# Patient Record
Sex: Male | Born: 1940 | Race: White | Hispanic: Yes | Marital: Married | Smoking: Never smoker
Health system: Southern US, Community
[De-identification: ages and names within clinical notes are randomized; demographics above are authoritative.]

## PROBLEM LIST (undated history)

## (undated) DIAGNOSIS — N289 Disorder of kidney and ureter, unspecified: Secondary | ICD-10-CM

## (undated) DIAGNOSIS — I442 Atrioventricular block, complete: Secondary | ICD-10-CM

## (undated) DIAGNOSIS — I10 Essential (primary) hypertension: Secondary | ICD-10-CM

## (undated) DIAGNOSIS — I447 Left bundle-branch block, unspecified: Secondary | ICD-10-CM

## (undated) DIAGNOSIS — E785 Hyperlipidemia, unspecified: Secondary | ICD-10-CM

## (undated) DIAGNOSIS — I255 Ischemic cardiomyopathy: Secondary | ICD-10-CM

## (undated) DIAGNOSIS — I2510 Atherosclerotic heart disease of native coronary artery without angina pectoris: Secondary | ICD-10-CM

## (undated) HISTORY — DX: Ischemic cardiomyopathy: I25.5

## (undated) HISTORY — PX: UMBILICAL HERNIA REPAIR: SHX196

## (undated) HISTORY — DX: Atherosclerotic heart disease of native coronary artery without angina pectoris: I25.10

## (undated) HISTORY — DX: Essential (primary) hypertension: I10

## (undated) HISTORY — DX: Left bundle-branch block, unspecified: I44.7

## (undated) HISTORY — PX: TONSILLECTOMY: SUR1361

## (undated) HISTORY — DX: Hyperlipidemia, unspecified: E78.5

## (undated) HISTORY — DX: Atrioventricular block, complete: I44.2

## (undated) HISTORY — PX: INGUINAL HERNIA REPAIR: SUR1180

## (undated) HISTORY — PX: OTHER SURGICAL HISTORY: SHX169

---

## 2010-06-12 HISTORY — PX: CORONARY ARTERY BYPASS GRAFT: SHX141

## 2010-11-21 LAB — PROTIME-INR

## 2011-01-14 ENCOUNTER — Ambulatory Visit (INDEPENDENT_AMBULATORY_CARE_PROVIDER_SITE_OTHER): Payer: Medicare Other | Admitting: Cardiology

## 2011-01-14 ENCOUNTER — Encounter: Payer: Self-pay | Admitting: Cardiology

## 2011-01-14 DIAGNOSIS — I2589 Other forms of chronic ischemic heart disease: Secondary | ICD-10-CM

## 2011-01-14 DIAGNOSIS — E785 Hyperlipidemia, unspecified: Secondary | ICD-10-CM

## 2011-01-14 DIAGNOSIS — I255 Ischemic cardiomyopathy: Secondary | ICD-10-CM

## 2011-01-14 DIAGNOSIS — I10 Essential (primary) hypertension: Secondary | ICD-10-CM | POA: Insufficient documentation

## 2011-01-14 DIAGNOSIS — I2510 Atherosclerotic heart disease of native coronary artery without angina pectoris: Secondary | ICD-10-CM | POA: Insufficient documentation

## 2011-01-14 MED ORDER — CARVEDILOL 6.25 MG PO TABS: 6.2500 mg | ORAL_TABLET | Freq: Two times a day (BID) | ORAL | Status: DC

## 2011-01-14 MED ORDER — PRAVASTATIN SODIUM 40 MG PO TABS: 40.0000 mg | ORAL_TABLET | Freq: Every evening | ORAL | Status: DC

## 2011-01-14 NOTE — Assessment & Plan Note (Signed)
Continue aspirin, beta blocker and ARB. Patient with previous rash to Crestor and myalgias with Zocor. Try Pravachol 40 mg daily. Check lipids and liver in 6 weeks.

## 2011-01-14 NOTE — Patient Instructions (Addendum)
Your physician recommends that you schedule a follow-up appointment in: 6 WEEKS  Your physician has requested that you have an echocardiogram. Echocardiography is a painless test that uses sound waves to create images of your heart. It provides your doctor with information about the size and shape of your heart and how well your heart's chambers and valves are working. This procedure takes approximately one hour. There are no restrictions for this procedure. AT THE [place] OFFICE-IN June 2013  STOP METOPROLOL  START CARVEDILOL 6.25 MG ONE TABLET TWICE DAILY  START PRAVASTATIN 40 MG ONCE DAILY AT BEDTIME  Your physician recommends that you return for a FASTING lipid profile: 6 WEEKS AFTER STARTING PRAVASTATIN  REFERRAL TO EP TO DISCUSS ICD

## 2011-01-14 NOTE — Progress Notes (Signed)
HPI: 70 year old male with past medical history of coronary artery disease status post coronary artery bypass and graft as well as ischemic cardiomyopathy for establishment. Patient recently moved from [place] to this area and presents to establish with a cardiologist. Patient's cardiac history dates back to 2010 when he had his first myocardial infarction. He had stents placed in [place]. He had a second myocardial infarction in March of 2012 and then had coronary artery bypass and graft. He was again admitted in August of 2012. He ruled in for a small subendocardial myocardial infarction. An echocardiogram in August of 2012 showed an ejection fraction of 15%. There was a question of small left ventricular apical thrombus. Apparently contrast echocardiogram was performed but I do not have those results available. There was also right atrial and right ventricular enlargement. There was mild mitral regurgitation. There was moderate aortic insufficiency. There is mild tricuspid regurgitation with moderately elevated pulmonary pressures. Cardiac catheterization was also performed in August of 2012. Ejection fraction was 20%. The right coronary and LAD were occluded and there was critical circumflex disease. There was a patent saphenous vein graft to the right coronary artery. The LIMA to the LAD was patent. The saphenous vein graft to the obtuse marginal was also patent. Patient has some dyspnea on exertion but no orthopnea or PND. Occasional mild edema in the left lower extremity where his veins were harvested. Occasional chest pain when turning in certain positions. No exertional chest pain. No syncope.  Current Outpatient Prescriptions  Medication Sig Dispense Refill  . allopurinol (ZYLOPRIM) 100 MG tablet Take 100 mg by mouth daily.        Marland Kitchen aspirin 325 MG tablet Take 325 mg by mouth daily.        . carvedilol (COREG) 6.25 MG tablet Take 1 tablet (6.25 mg total) by mouth 2 (two) times daily.  60 tablet  11  .  ezetimibe (ZETIA) 10 MG tablet Take 10 mg by mouth daily.        . Multiple Vitamins-Minerals (CENTRUM SILVER PO) Take 1 tablet by mouth daily.        . Omega-3 Fatty Acids (FISH OIL) 1000 MG CAPS Take by mouth 2 (two) times daily.        . pravastatin (PRAVACHOL) 40 MG tablet Take 1 tablet (40 mg total) by mouth every evening.  30 tablet  11  . saw palmetto 160 MG capsule Take 160 mg by mouth daily.        Marland Kitchen telmisartan (MICARDIS) 80 MG tablet Take 80 mg by mouth daily.          Allergies  Allergen Reactions  . Statins     Past Medical History  Diagnosis Date  . Hypertension   . Hyperlipidemia   . Gout   . CAD (coronary artery disease)   . Ischemic cardiomyopathy     Past Surgical History  Procedure Date  . Coronary artery bypass graft 3/12  . Tonsillectomy   . Umbilical hernia repair   . Inguinal hernia repair   . Carpel tunnel surgery     History   Social History  . Marital Status: Married    Spouse Name: N/A    Number of Children: 4  . Years of Education: N/A   Occupational History  .      Retired   Social History Main Topics  . Smoking status: Never Smoker   . Smokeless tobacco: Not on file  . Alcohol Use: Yes  occasional  . Drug Use: No  . Sexually Active: Not on file   Other Topics Concern  . Not on file   Social History Narrative  . No narrative on file    No family history on file.  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 Remaining systems are negative.  Physical Exam: General:  Well developed/well nourished in NAD Skin warm/dry Patient not depressed No peripheral clubbing Back-normal HEENT-normal/normal eyelids Neck supple/normal carotid upstroke bilaterally; no bruits; no JVD; no thyromegaly chest - CTA/ normal expansion; status post sternotomy CV - RRR/normal S1 and S2; no murmurs,  rubs or gallops;  PMI nondisplaced Abdomen -NT/ND, no HSM, no mass, + bowel sounds, no bruit 2+ femoral pulses, no bruits Ext-trace edema, no eventchords, 2+ DP Neuro-grossly nonfocal  ECG sinus rhythm at a rate of 73. First degree AV block. Left bundle branch block. Left atrial enlargement.

## 2011-01-14 NOTE — Assessment & Plan Note (Signed)
Add Pravachol 40 mg daily and check lipids and liver in 6 weeks.

## 2011-01-14 NOTE — Assessment & Plan Note (Signed)
Continue ARB. Discontinue metoprolol. Add Coreg 6.25 mg p.o. B.i.d. Patient had a small myocardial infarction in August of 2012. His ejection fraction was severely reduced. Repeat echocardiogram in November. I will also refer to the electrophysiology as he will most certainly require ICD. Obtain records from previous hospitalization. There is note of question LV apical thrombus. Apparently the patient had a followup study with contrast and I will obtain those results.

## 2011-01-14 NOTE — Assessment & Plan Note (Signed)
Blood pressure controlled. 

## 2011-01-15 ENCOUNTER — Telehealth: Payer: Self-pay | Admitting: Cardiology

## 2011-01-19 ENCOUNTER — Telehealth: Payer: Self-pay | Admitting: Cardiology

## 2011-01-19 ENCOUNTER — Ambulatory Visit: Payer: Medicare Other | Admitting: Internal Medicine

## 2011-01-19 NOTE — Telephone Encounter (Addendum)
ROI faxed to [facility] in [place] @ [phone]  01/19/11/km  Records received from [facility] gave to [facility]  02/10/11/km

## 2011-01-28 ENCOUNTER — Encounter: Payer: Self-pay | Admitting: Cardiology

## 2011-02-02 ENCOUNTER — Telehealth: Payer: Self-pay | Admitting: Cardiology

## 2011-02-02 NOTE — Telephone Encounter (Signed)
SPOKE WITH PT IN GREAT LENGTH RE  NEW MEDS PRAVASTATIN AND CARVEDILOL  PT C/O  SWELLING TO FACE LEGS AND ANKLES  DIARRHEA AND LOSS OF APPETITE HAD STOPPED BOTH MEDS AND WENT BACK TO METOPROLOL  INFORMED PT  MAYBE TO HOLD PRAVASTATIN AND RESUME CARVEDILOL  TO SEE IF TOLERATES HARD TO KNOW WHICH MED CAUSED PROBLEMS WHEN BOTH WERE STARTED AT SAME TIME   PT VERBALIZED UNDERSTANDING  WILL RESUME CARVEDILOL INFORMED  CARVEDILOL WILL  HELP WITH HEART FUNCTION./CY

## 2011-02-02 NOTE — Telephone Encounter (Signed)
Discontinue pravachol and metoprolol and continue coreg Olga Millers

## 2011-02-02 NOTE — Telephone Encounter (Signed)
Pt called and is having a problem with some of his medications.  Please call him back regarding same.

## 2011-02-04 ENCOUNTER — Encounter: Payer: Self-pay | Admitting: Internal Medicine

## 2011-02-04 ENCOUNTER — Ambulatory Visit (INDEPENDENT_AMBULATORY_CARE_PROVIDER_SITE_OTHER): Payer: Medicare Other | Admitting: Internal Medicine

## 2011-02-04 DIAGNOSIS — I255 Ischemic cardiomyopathy: Secondary | ICD-10-CM

## 2011-02-04 DIAGNOSIS — I10 Essential (primary) hypertension: Secondary | ICD-10-CM

## 2011-02-04 DIAGNOSIS — E785 Hyperlipidemia, unspecified: Secondary | ICD-10-CM

## 2011-02-04 DIAGNOSIS — I5023 Acute on chronic systolic (congestive) heart failure: Secondary | ICD-10-CM

## 2011-02-04 DIAGNOSIS — I5022 Chronic systolic (congestive) heart failure: Secondary | ICD-10-CM

## 2011-02-04 DIAGNOSIS — I519 Heart disease, unspecified: Secondary | ICD-10-CM | POA: Insufficient documentation

## 2011-02-04 DIAGNOSIS — I2589 Other forms of chronic ischemic heart disease: Secondary | ICD-10-CM

## 2011-02-04 MED ORDER — FUROSEMIDE 40 MG PO TABS: 40.0000 mg | ORAL_TABLET | Freq: Every day | ORAL | Status: DC

## 2011-02-04 MED ORDER — ALLOPURINOL 100 MG PO TABS: 100.0000 mg | ORAL_TABLET | Freq: Every day | ORAL | Status: DC

## 2011-02-04 MED ORDER — POTASSIUM CHLORIDE 10 MEQ PO TBCR: EXTENDED_RELEASE_TABLET | ORAL | Status: DC

## 2011-02-04 MED ORDER — POTASSIUM CHLORIDE ER 10 MEQ PO TBCR: 20.0000 meq | EXTENDED_RELEASE_TABLET | Freq: Every day | ORAL | Status: DC

## 2011-02-04 MED ORDER — POTASSIUM CHLORIDE CRYS ER 20 MEQ PO TBCR: 20.0000 meq | EXTENDED_RELEASE_TABLET | Freq: Every day | ORAL | Status: DC

## 2011-02-04 NOTE — Telephone Encounter (Signed)
Pharmacy calling regarding potassium being called in with 2 strengths 10 mg and 20 mg. Pharmacy wanted to clarify which one MD wanted filled. Please return call to discuss further.

## 2011-02-04 NOTE — Progress Notes (Signed)
Referring Physician:  Dr Jens Som PCP: none  [place] Service is a pleasant 70 y.o. patient with a h/o CAD, ischemic CM, and NYHA Class II/III CHF who presents today for EP consultation regarding risk stratefication of sudden death.  He recently moved from [place] to this area. His cardiac history dates back to 2010 when he had his first myocardial infarction. He had stents placed in [place]. He had a second myocardial infarction in March of 2012 and then had coronary artery bypass and graft. He was again admitted in August of 2012. He ruled in for a small subendocardial myocardial infarction. An echocardiogram in August of 2012 showed an ejection fraction of 15%. There was a question of small left ventricular apical thrombus.  There was also right atrial and right ventricular enlargement. There was mild mitral regurgitation. There was moderate aortic insufficiency. There is mild tricuspid regurgitation with moderately elevated pulmonary pressures. Cardiac catheterization was also performed in August of 2012. Ejection fraction was 20%. The right coronary and LAD were occluded and there was critical circumflex disease. There was a patent saphenous vein graft to the right coronary artery. The LIMA to the LAD was patent. The saphenous vein graft to the obtuse marginal was also patent.  Presently, he reports being sedentary.  He feels that he could walk 10 blocks but does not do this very often.  He reports dypnsea with about 5 blocks.  He has significant pedal edema, which has increased since his recent visit to Dr Jens Som.  Today, he denies symptoms of palpitations, chest pain, dizziness, presyncope, syncope, or neurologic sequela. The patient is tolerating medications without difficulties and is otherwise without complaint today.   Past Medical History  Diagnosis Date  . Hypertension   . Hyperlipidemia   . Gout   . CAD (coronary artery disease)   . Ischemic cardiomyopathy   . LBBB (left bundle branch  block)    Past Surgical History  Procedure Date  . Coronary artery bypass graft 3/12    in [place]  . Tonsillectomy   . Umbilical hernia repair   . Inguinal hernia repair   . Carpel tunnel surgery     Current Outpatient Prescriptions  Medication Sig Dispense Refill  . allopurinol (ZYLOPRIM) 100 MG tablet Take 100 mg by mouth daily.        . carvedilol (COREG) 6.25 MG tablet Take 1 tablet (6.25 mg total) by mouth 2 (two) times daily.  60 tablet  11  . ezetimibe (ZETIA) 10 MG tablet Take 10 mg by mouth daily.        . Multiple Vitamins-Minerals (CENTRUM SILVER PO) Take 1 tablet by mouth daily.        . Omega-3 Fatty Acids (FISH OIL) 1000 MG CAPS Take by mouth 2 (two) times daily.        . pravastatin (PRAVACHOL) 40 MG tablet Take 1 tablet (40 mg total) by mouth every evening.  30 tablet  11  . saw palmetto 160 MG capsule Take 160 mg by mouth daily.        Marland Kitchen telmisartan (MICARDIS) 80 MG tablet Take 80 mg by mouth daily.        Marland Kitchen aspirin 325 MG tablet Take 325 mg by mouth daily.          Allergies  Allergen Reactions  . Statins     History   Social History  . Marital Status: Married    Spouse Name: N/A    Number of Children: 4  .  Years of Education: N/A   Occupational History  .      Retired   Social History Main Topics  . Smoking status: Never Smoker   . Smokeless tobacco: Not on file  . Alcohol Use: Yes     occasional  . Drug Use: No  . Sexually Active: Not on file   Other Topics Concern  . 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  FH- denies FH of CAD  ROS- All systems are reviewed and negative except as per the HPI above  Physical Exam: Filed Vitals:   02/04/11 1141  BP: 128/80  Pulse: 72  Height: 5\' 6"  (1.676 m)  Weight: 175 lb 1.9 oz (79.434 kg)    GEN- The patient is well appearing, alert and oriented x 3 today.   Head- normocephalic, atraumatic Eyes-  Sclera clear,  conjunctiva pink Ears- hearing intact Oropharynx- clear Neck- supple, JVP 9cm Lymph- no cervical lymphadenopathy Lungs- Clear to ausculation bilaterally, normal work of breathing Heart- Regular rate and rhythm, no murmurs, rubs or gallops, PMI not laterally displaced GI- soft, NT, ND, + BS Extremities- no clubbing, cyanosis, 2+ L>R edema MS- no significant deformity or atrophy Skin- no rash or lesion Psych- euthymic mood, full affect Neuro- strength and sensation are intact  EKG 01/14/11- sinus rhythm 73 bpm, PR 288, LBBB (QRS )  Assessment and Plan:

## 2011-02-04 NOTE — Patient Instructions (Signed)
Your physician recommends that you schedule a follow-up appointment in: 2 weeks with Tereso Newcomer,      Your physician recommends that you return for lab work in: 2 weeks same day as appointment with Tereso Newcomer  Your physician has recommended you make the following change in your medication:  1)start Furosemide 20mg  daily 2)start Potassium daily 3)start Alopurinol 100mg  daily for gout  Keep your follow up as scheduled with Dr Jens Som

## 2011-02-04 NOTE — Assessment & Plan Note (Signed)
Stable No change required today  

## 2011-02-04 NOTE — Assessment & Plan Note (Signed)
The patient has CAD, ischemic CM (EF 20%), and NYHA CLass III CHF.  He has a LBBB with QRS .  He has been treated with good medical therapy.  At this time, he meets MADIT II/ SCD-HeFT criteria for ICD implantation for primary prevention of sudden death.  Given his LBBB and QRS of , he also has class IIa indication for CRT.  Risks, benefits, alternatives to BiVICD implantation were discussed in detail with the patient today. The patient  understands that the risks include but are not limited to bleeding, infection, pneumothorax, perforation, tamponade, vascular damage, renal failure, MI, stroke, death, inappropriate shocks, and lead dislodgement and wishes to further contemplate this option.  He will contact my office if he decides to proceed with BiV ICD in the future.

## 2011-02-04 NOTE — Telephone Encounter (Signed)
Spoke with Dave Daniels at the pharm, klor-con is 20 meq daily [name]

## 2011-02-04 NOTE — Assessment & Plan Note (Signed)
Reports intolerance with statins previously but appears to be doing ok with Pravachol.  Will defer to Dr Jens Som

## 2011-02-10 ENCOUNTER — Encounter: Payer: Self-pay | Admitting: *Deleted

## 2011-02-11 ENCOUNTER — Ambulatory Visit (INDEPENDENT_AMBULATORY_CARE_PROVIDER_SITE_OTHER): Payer: Medicare Other | Admitting: Cardiology

## 2011-02-11 ENCOUNTER — Encounter: Payer: Self-pay | Admitting: Cardiology

## 2011-02-11 ENCOUNTER — Telehealth: Payer: Self-pay | Admitting: Cardiology

## 2011-02-11 DIAGNOSIS — I255 Ischemic cardiomyopathy: Secondary | ICD-10-CM

## 2011-02-11 DIAGNOSIS — I2510 Atherosclerotic heart disease of native coronary artery without angina pectoris: Secondary | ICD-10-CM

## 2011-02-11 DIAGNOSIS — Z79899 Other long term (current) drug therapy: Secondary | ICD-10-CM

## 2011-02-11 DIAGNOSIS — E7800 Pure hypercholesterolemia, unspecified: Secondary | ICD-10-CM

## 2011-02-11 DIAGNOSIS — I10 Essential (primary) hypertension: Secondary | ICD-10-CM

## 2011-02-11 DIAGNOSIS — I2589 Other forms of chronic ischemic heart disease: Secondary | ICD-10-CM

## 2011-02-11 NOTE — Assessment & Plan Note (Addendum)
Continue beta blocker and ARB. Patient euvolemic on examination. Continue present dose of Lasix. Check potassium and renal function. He is now ready to have ICD placed. Repeat echocardiogram in mid November which will be 3 months from his last infarct. If ejection fraction less than or equal to 35% proceed with ICD. Note previous echo suggested possible apical thrombus based on outside records. He had a contrast echocardiogram and we are still awaiting those results.

## 2011-02-11 NOTE — Patient Instructions (Signed)
Your physician wants you to follow-up in: 6 MONTHS You will receive a reminder letter in the mail two months in advance. If you don't receive a letter, please call our office to schedule the follow-up appointment.   Your physician recommends that you return for a FASTING lipid profile: WHEN ABLE  Your physician has requested that you have an echocardiogram. Echocardiography is a painless test that uses sound waves to create images of your heart. It provides your doctor with information about the size and shape of your heart and how well your heart's chambers and valves are working. This procedure takes approximately one hour. There are no restrictions for this procedure.

## 2011-02-11 NOTE — Telephone Encounter (Addendum)
ROI faxed to Dr.James Altamirano @ [phone]  02/11/11/km  Records received from Dr.Altamirano's Office gave to [facility] 02/17/11/km

## 2011-02-11 NOTE — Assessment & Plan Note (Signed)
Continue aspirin and statin. 

## 2011-02-11 NOTE — Assessment & Plan Note (Signed)
Blood pressure controlled. Continue present medications. 

## 2011-02-11 NOTE — Progress Notes (Signed)
HPI: Pleasant male with past medical history of coronary artery disease status post coronary artery bypass and graft as well as ischemic cardiomyopathy for FU. Patient recently moved from [place] to this area. Patient's cardiac history dates back to 2010 when he had his first myocardial infarction. He had stents placed in [place]. He had a second myocardial infarction in March of 2012 and then had coronary artery bypass and graft. He was again admitted in August of 2012. He ruled in for a small subendocardial myocardial infarction. An echocardiogram in August of 2012 showed an ejection fraction of 15%. There was a question of small left ventricular apical thrombus. Apparently contrast echocardiogram was performed but I do not have those results available. There was also right atrial and right ventricular enlargement. There was mild mitral regurgitation. There was moderate aortic insufficiency. There is mild tricuspid regurgitation with moderately elevated pulmonary pressures. Cardiac catheterization was also performed in August of 2012. Ejection fraction was 20%. The right coronary and LAD were occluded and there was critical circumflex disease. There was a patent saphenous vein graft to the right coronary artery. The LIMA to the LAD was patent. The saphenous vein graft to the obtuse marginal was also patent. Patient seen by Dr Johney Frame for consideration of ICD but he is still contemplating. Since I last saw him in Sept 2012, he denies dyspnea, chest pain, palpitations or syncope. Note low-dose Lasix was added at the time he saw Dr. Johney Frame for lower extremity edema. This has now improved.   Current Outpatient Prescriptions  Medication Sig Dispense Refill  . allopurinol (ZYLOPRIM) 100 MG tablet Take 1 tablet (100 mg total) by mouth daily.  30 tablet  3  . aspirin 325 MG tablet Take 325 mg by mouth daily.        . carvedilol (COREG) 6.25 MG tablet Take 1 tablet (6.25 mg total) by mouth 2 (two) times daily.  60 tablet   11  . ezetimibe (ZETIA) 10 MG tablet Take 10 mg by mouth daily.        . furosemide (LASIX) 40 MG tablet Take 1 tablet (40 mg total) by mouth daily.  30 tablet  11  . Multiple Vitamins-Minerals (CENTRUM SILVER PO) Take 1 tablet by mouth daily.        . Omega-3 Fatty Acids (FISH OIL) 1000 MG CAPS Take by mouth 2 (two) times daily.        . potassium chloride SA (K-DUR,KLOR-CON) 20 MEQ tablet Take 1 tablet (20 mEq total) by mouth daily.  30 tablet  11  . pravastatin (PRAVACHOL) 40 MG tablet Take 1 tablet (40 mg total) by mouth every evening.  30 tablet  11  . saw palmetto 160 MG capsule Take 160 mg by mouth daily.        Marland Kitchen telmisartan (MICARDIS) 80 MG tablet Take 80 mg by mouth daily.        Marland Kitchen DISCONTD: allopurinol (ZYLOPRIM) 100 MG tablet Take 1 tablet (100 mg total) by mouth daily.  30 tablet  3     Past Medical History  Diagnosis Date  . Hypertension   . Hyperlipidemia   . Gout   . CAD (coronary artery disease)   . Ischemic cardiomyopathy   . LBBB (left bundle branch block)     Past Surgical History  Procedure Date  . Coronary artery bypass graft 3/12    in [place]  . Tonsillectomy   . Umbilical hernia repair   . Inguinal hernia repair   .  Carpel tunnel surgery     History   Social History  . Marital Status: Married    Spouse Name: N/A    Number of Children: 4  . Years of Education: N/A   Occupational History  .      Retired   Social History Main Topics  . Smoking status: Never Smoker   . Smokeless tobacco: Not on file  . Alcohol Use: Yes     occasional  . Drug Use: No  . Sexually Active: Not on file   Other Topics Concern  . 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 Remaining systems are  negative.  Physical Exam: Well-developed well-nourished in no acute distress.  Skin is warm and dry.  HEENT is normal.  Neck is supple. No thyromegaly.  Chest is clear to auscultation with normal expansion.  Cardiovascular exam is regular rate and rhythm.  Abdominal exam nontender or distended. No masses palpated. Extremities show no edema. neuro grossly intact

## 2011-02-11 NOTE — Assessment & Plan Note (Signed)
Continue present medications. Check potassium and renal function. 

## 2011-02-12 LAB — BASIC METABOLIC PANEL WITH GFR
BUN: 24 mg/dL — ABNORMAL HIGH (ref 6–23)
CO2: 29 mEq/L (ref 19–32)
Chloride: 103 mEq/L (ref 96–112)
Creat: 1.72 mg/dL — ABNORMAL HIGH (ref 0.50–1.35)

## 2011-02-18 ENCOUNTER — Other Ambulatory Visit: Payer: Self-pay | Admitting: *Deleted

## 2011-02-18 MED ORDER — EZETIMIBE 10 MG PO TABS: 10.0000 mg | ORAL_TABLET | Freq: Every day | ORAL | Status: DC

## 2011-02-25 ENCOUNTER — Other Ambulatory Visit: Payer: Medicare Other | Admitting: *Deleted

## 2011-02-25 ENCOUNTER — Ambulatory Visit: Payer: Medicare Other | Admitting: Physician Assistant

## 2011-02-26 ENCOUNTER — Ambulatory Visit (HOSPITAL_COMMUNITY): Payer: Medicare Other | Attending: Cardiology | Admitting: Radiology

## 2011-02-26 ENCOUNTER — Ambulatory Visit (INDEPENDENT_AMBULATORY_CARE_PROVIDER_SITE_OTHER): Payer: Medicare Other | Admitting: *Deleted

## 2011-02-26 DIAGNOSIS — I080 Rheumatic disorders of both mitral and aortic valves: Secondary | ICD-10-CM | POA: Insufficient documentation

## 2011-02-26 DIAGNOSIS — E785 Hyperlipidemia, unspecified: Secondary | ICD-10-CM | POA: Insufficient documentation

## 2011-02-26 DIAGNOSIS — I2589 Other forms of chronic ischemic heart disease: Secondary | ICD-10-CM

## 2011-02-26 DIAGNOSIS — I10 Essential (primary) hypertension: Secondary | ICD-10-CM | POA: Insufficient documentation

## 2011-02-26 DIAGNOSIS — I079 Rheumatic tricuspid valve disease, unspecified: Secondary | ICD-10-CM | POA: Insufficient documentation

## 2011-02-26 DIAGNOSIS — I2510 Atherosclerotic heart disease of native coronary artery without angina pectoris: Secondary | ICD-10-CM

## 2011-02-26 DIAGNOSIS — I379 Nonrheumatic pulmonary valve disorder, unspecified: Secondary | ICD-10-CM | POA: Insufficient documentation

## 2011-02-26 DIAGNOSIS — E7800 Pure hypercholesterolemia, unspecified: Secondary | ICD-10-CM

## 2011-02-26 DIAGNOSIS — I252 Old myocardial infarction: Secondary | ICD-10-CM | POA: Insufficient documentation

## 2011-02-26 DIAGNOSIS — Z79899 Other long term (current) drug therapy: Secondary | ICD-10-CM

## 2011-02-26 LAB — LIPID PANEL
Cholesterol: 141 mg/dL (ref 0–200)
Total CHOL/HDL Ratio: 3
Triglycerides: 57 mg/dL (ref 0.0–149.0)

## 2011-02-26 LAB — BASIC METABOLIC PANEL
CO2: 29 mEq/L (ref 19–32)
Calcium: 9.5 mg/dL (ref 8.4–10.5)
Chloride: 106 mEq/L (ref 96–112)
Glucose, Bld: 82 mg/dL (ref 70–99)
Sodium: 142 mEq/L (ref 135–145)

## 2011-02-26 LAB — HEPATIC FUNCTION PANEL
ALT: 66 U/L — ABNORMAL HIGH (ref 0–53)
Albumin: 3.8 g/dL (ref 3.5–5.2)
Alkaline Phosphatase: 115 U/L (ref 39–117)
Total Protein: 7.1 g/dL (ref 6.0–8.3)

## 2011-02-27 ENCOUNTER — Telehealth: Payer: Self-pay | Admitting: Cardiology

## 2011-02-27 NOTE — Telephone Encounter (Signed)
Follow up on previous call:  Returning call back to nurse.   

## 2011-02-27 NOTE — Telephone Encounter (Signed)
Spoke with pt, aware of echo and lab results. He will call back to schedule ICD. [name]

## 2011-03-02 ENCOUNTER — Telehealth: Payer: Self-pay | Admitting: Cardiology

## 2011-03-02 NOTE — Telephone Encounter (Signed)
Pt rtn call from Friday to schedule defib placement and the date that is scheduled he can not do that date.

## 2011-03-02 NOTE — Telephone Encounter (Signed)
Spoke with pt, he would like to do his ICD implant on 04/01/11 with dr allred. Will make dr allred's nurse aware [name]

## 2011-03-03 ENCOUNTER — Telehealth: Payer: Self-pay | Admitting: Cardiology

## 2011-03-03 MED ORDER — LISINOPRIL 5 MG PO TABS: 5.0000 mg | ORAL_TABLET | Freq: Every day | ORAL | Status: DC

## 2011-03-03 NOTE — Telephone Encounter (Signed)
PT AWARE OF MED CHANGE./CY 

## 2011-03-03 NOTE — Telephone Encounter (Signed)
New Msg: Pt calling to speak with nurse to see if pt can get alternate medicine for micardis. Please return pt call to discuss further.

## 2011-03-03 NOTE — Telephone Encounter (Signed)
Patient wants to stop micardis due to high cost wants alternative medicine

## 2011-03-03 NOTE — Telephone Encounter (Signed)
If patient has not had a cough or allergy with micardis, dc and begin lisinopril 5 mg daily; bmet one week. Olga Millers

## 2011-03-09 ENCOUNTER — Telehealth: Payer: Self-pay | Admitting: Internal Medicine

## 2011-03-09 ENCOUNTER — Encounter: Payer: Self-pay | Admitting: *Deleted

## 2011-03-09 DIAGNOSIS — I255 Ischemic cardiomyopathy: Secondary | ICD-10-CM

## 2011-03-09 DIAGNOSIS — I2510 Atherosclerotic heart disease of native coronary artery without angina pectoris: Secondary | ICD-10-CM

## 2011-03-09 NOTE — Telephone Encounter (Signed)
Addended by: Dennis Bast F on: 03/09/2011 12:19 PM   Modules accepted: Orders

## 2011-03-09 NOTE — Telephone Encounter (Signed)
Pt was to be scheduled for a  defib placement, wants to know if already set up, if not can he be scheduled?

## 2011-03-09 NOTE — Telephone Encounter (Signed)
Spoke with patient  He is scheduled for 04/01/11  Labs on 03/25/11 at

## 2011-03-14 HISTORY — PX: CARDIAC DEFIBRILLATOR PLACEMENT: SHX171

## 2011-03-16 NOTE — Telephone Encounter (Signed)
Spoke with pt, he has had an emergency and is out of town and will not be able to come back until he is due for labs prior to the procedure with dr allred. Pt given the okay to do blood work for dr Jens Som when he comes for pre-procedure labs [facility]

## 2011-03-16 NOTE — Telephone Encounter (Signed)
Fu call Pt wants to know about blood test he is supposed to take. He missed it today and has some questions.

## 2011-03-25 ENCOUNTER — Ambulatory Visit (INDEPENDENT_AMBULATORY_CARE_PROVIDER_SITE_OTHER): Payer: Medicare Other | Admitting: *Deleted

## 2011-03-25 DIAGNOSIS — I2510 Atherosclerotic heart disease of native coronary artery without angina pectoris: Secondary | ICD-10-CM

## 2011-03-25 DIAGNOSIS — I2589 Other forms of chronic ischemic heart disease: Secondary | ICD-10-CM

## 2011-03-25 DIAGNOSIS — I255 Ischemic cardiomyopathy: Secondary | ICD-10-CM

## 2011-03-25 LAB — CBC WITH DIFFERENTIAL/PLATELET
Basophils Relative: 0.3 % (ref 0.0–3.0)
Eosinophils Absolute: 0.2 10*3/uL (ref 0.0–0.7)
MCHC: 33 g/dL (ref 30.0–36.0)
MCV: 85.7 fl (ref 78.0–100.0)
Monocytes Absolute: 0.6 10*3/uL (ref 0.1–1.0)
Neutrophils Relative %: 76.9 % (ref 43.0–77.0)
RBC: 5.06 Mil/uL (ref 4.22–5.81)

## 2011-03-25 LAB — PROTIME-INR: INR: 1 ratio (ref 0.8–1.0)

## 2011-03-25 LAB — BASIC METABOLIC PANEL
BUN: 30 mg/dL — ABNORMAL HIGH (ref 6–23)
CO2: 34 mEq/L — ABNORMAL HIGH (ref 19–32)
Chloride: 102 mEq/L (ref 96–112)
Creatinine, Ser: 1.6 mg/dL — ABNORMAL HIGH (ref 0.4–1.5)
Glucose, Bld: 69 mg/dL — ABNORMAL LOW (ref 70–99)

## 2011-03-31 ENCOUNTER — Other Ambulatory Visit: Payer: Self-pay | Admitting: *Deleted

## 2011-03-31 DIAGNOSIS — I509 Heart failure, unspecified: Secondary | ICD-10-CM

## 2011-03-31 MED ORDER — SODIUM CHLORIDE 0.9 % IV SOLN: INTRAVENOUS | Status: DC

## 2011-03-31 MED ORDER — CHLORHEXIDINE GLUCONATE 4 % EX LIQD
60.0000 mL | Freq: Once | CUTANEOUS | Status: DC
  Filled 2011-03-31: qty 60

## 2011-03-31 MED ORDER — SODIUM CHLORIDE 0.9 % IR SOLN
80.0000 mg | Status: DC
  Filled 2011-03-31: qty 2

## 2011-03-31 MED ORDER — SODIUM CHLORIDE 0.45 % IV SOLN
INTRAVENOUS | Status: DC
  Administered 2011-04-01: 10:00:00 via INTRAVENOUS

## 2011-03-31 MED ORDER — CEFAZOLIN SODIUM 1-5 GM-% IV SOLN: 1.0000 g | INTRAVENOUS | Status: DC

## 2011-04-01 ENCOUNTER — Ambulatory Visit (HOSPITAL_COMMUNITY)
Admission: RE | Admit: 2011-04-01 | Discharge: 2011-04-02 | Disposition: A | Discharge status: Home / Self Care | Payer: Medicare Other | Source: Ambulatory Visit | Attending: Internal Medicine | Admitting: Internal Medicine

## 2011-04-01 ENCOUNTER — Encounter (HOSPITAL_COMMUNITY)
Admission: RE | Disposition: A | Discharge status: Home / Self Care | Payer: Self-pay | Source: Ambulatory Visit | Attending: Internal Medicine

## 2011-04-01 ENCOUNTER — Encounter (HOSPITAL_COMMUNITY): Payer: Self-pay | Admitting: *Deleted

## 2011-04-01 DIAGNOSIS — I509 Heart failure, unspecified: Secondary | ICD-10-CM

## 2011-04-01 DIAGNOSIS — I255 Ischemic cardiomyopathy: Secondary | ICD-10-CM | POA: Insufficient documentation

## 2011-04-01 DIAGNOSIS — I447 Left bundle-branch block, unspecified: Secondary | ICD-10-CM | POA: Insufficient documentation

## 2011-04-01 DIAGNOSIS — I2589 Other forms of chronic ischemic heart disease: Secondary | ICD-10-CM | POA: Insufficient documentation

## 2011-04-01 DIAGNOSIS — I2510 Atherosclerotic heart disease of native coronary artery without angina pectoris: Secondary | ICD-10-CM | POA: Insufficient documentation

## 2011-04-01 DIAGNOSIS — I10 Essential (primary) hypertension: Secondary | ICD-10-CM | POA: Insufficient documentation

## 2011-04-01 HISTORY — DX: Disorder of kidney and ureter, unspecified: N28.9

## 2011-04-01 HISTORY — PX: BI-VENTRICULAR IMPLANTABLE CARDIOVERTER DEFIBRILLATOR: SHX5459

## 2011-04-01 LAB — SURGICAL PCR SCREEN: Staphylococcus aureus: NEGATIVE

## 2011-04-01 SURGERY — BI-VENTRICULAR IMPLANTABLE CARDIOVERTER DEFIBRILLATOR  (CRT-D)
Anesthesia: LOCAL

## 2011-04-01 MED ORDER — LIDOCAINE HCL (PF) 1 % IJ SOLN
INTRAMUSCULAR | Status: AC
  Filled 2011-04-01: qty 60

## 2011-04-01 MED ORDER — MIDAZOLAM HCL 5 MG/5ML IJ SOLN
INTRAMUSCULAR | Status: AC
  Filled 2011-04-01: qty 5

## 2011-04-01 MED ORDER — SODIUM CHLORIDE 0.9 % IJ SOLN
3.0000 mL | Freq: Two times a day (BID) | INTRAMUSCULAR | Status: DC
  Administered 2011-04-01: 3 mL via INTRAVENOUS

## 2011-04-01 MED ORDER — FENTANYL CITRATE 0.05 MG/ML IJ SOLN
INTRAMUSCULAR | Status: AC
  Filled 2011-04-01: qty 2

## 2011-04-01 MED ORDER — MUPIROCIN 2 % EX OINT
TOPICAL_OINTMENT | Freq: Two times a day (BID) | CUTANEOUS | Status: DC
  Filled 2011-04-01: qty 22

## 2011-04-01 MED ORDER — LISINOPRIL 5 MG PO TABS
5.0000 mg | ORAL_TABLET | Freq: Every day | ORAL | Status: DC
  Administered 2011-04-02: 5 mg via ORAL
  Filled 2011-04-01 (×3): qty 1

## 2011-04-01 MED ORDER — SODIUM CHLORIDE 0.9 % IJ SOLN: 3.0000 mL | INTRAMUSCULAR | Status: DC | PRN

## 2011-04-01 MED ORDER — MUPIROCIN 2 % EX OINT
TOPICAL_OINTMENT | CUTANEOUS | Status: AC
  Filled 2011-04-01: qty 22

## 2011-04-01 MED ORDER — HEPARIN (PORCINE) IN NACL 2-0.9 UNIT/ML-% IJ SOLN
INTRAMUSCULAR | Status: AC
  Filled 2011-04-01: qty 1000

## 2011-04-01 MED ORDER — ACETAMINOPHEN 500 MG PO TABS
1000.0000 mg | ORAL_TABLET | Freq: Four times a day (QID) | ORAL | Status: DC
  Administered 2011-04-01: 1000 mg via ORAL
  Administered 2011-04-02: 500 mg via ORAL
  Filled 2011-04-01 (×5): qty 2

## 2011-04-01 MED ORDER — ONDANSETRON HCL 4 MG/2ML IJ SOLN: 4.0000 mg | Freq: Four times a day (QID) | INTRAMUSCULAR | Status: DC | PRN

## 2011-04-01 MED ORDER — ALLOPURINOL 100 MG PO TABS
100.0000 mg | ORAL_TABLET | Freq: Every day | ORAL | Status: DC
Start: 2011-04-01 — End: 2011-04-02
  Administered 2011-04-01: 100 mg via ORAL
  Filled 2011-04-01 (×2): qty 1

## 2011-04-01 MED ORDER — EZETIMIBE 10 MG PO TABS
10.0000 mg | ORAL_TABLET | Freq: Every day | ORAL | Status: DC
  Filled 2011-04-01 (×2): qty 1

## 2011-04-01 MED ORDER — CEFAZOLIN SODIUM 1-5 GM-% IV SOLN
1.0000 g | Freq: Four times a day (QID) | INTRAVENOUS | Status: AC
  Administered 2011-04-01 – 2011-04-02 (×3): 1 g via INTRAVENOUS
  Filled 2011-04-01 (×3): qty 50

## 2011-04-01 MED ORDER — ACETAMINOPHEN 325 MG PO TABS: 325.0000 mg | ORAL_TABLET | ORAL | Status: DC | PRN

## 2011-04-01 MED ORDER — CARVEDILOL 6.25 MG PO TABS
6.2500 mg | ORAL_TABLET | Freq: Two times a day (BID) | ORAL | Status: DC
  Administered 2011-04-01 – 2011-04-02 (×2): 6.25 mg via ORAL
  Filled 2011-04-01 (×4): qty 1

## 2011-04-01 MED ORDER — HYDROCODONE-ACETAMINOPHEN 5-325 MG PO TABS
1.0000 | ORAL_TABLET | ORAL | Status: DC | PRN
  Administered 2011-04-01: 2 via ORAL
  Filled 2011-04-01: qty 2

## 2011-04-01 NOTE — Op Note (Signed)
SURGEON:  Hillis Range, MD      PREPROCEDURE DIAGNOSES:   1. Ischemic cardiomyopathy.   2. New York Heart Association class III, heart failure chronically.   3. Left bundle-branch block.      POSTPROCEDURE DIAGNOSES:   1. Ischemic cardiomyopathy.   2. 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   3. Left bundle-branch block.      PROCEDURES:    1. Biventricular ICD implantation.  2. Defibrillation threshold testing  3. Programmed extrastimulus testing     INTRODUCTION:  Dave Daniels is a 70 y.o. male with a ischemic CM (EF 20-25%), NYHA Class III CHF, and LBBB QRS morophology. At this time, he meets MADIT II/ SCD-HeFT criteria for ICD implantation for primary prevention of sudden death.  Given LBBB, the patient may also be expected to benefit from resynchronization therapy. The patient has been treated with an optimal medical regimen but continues to have a depressed ejection fraction and NYHA Class III CHF symptoms.  he therefore  presents today for a biventricular ICD implantation.      DESCRIPTION OF PROCEDURE:  Informed written consent was obtained and the patient was brought to the electrophysiology lab in the fasting state. The patient was adequately sedated with intravenous Versed, and fentanyl as outlined in the nursing report.  The patient's left chest was prepped and draped in the usual sterile fashion by the EP lab staff.  The skin overlying the left deltopectoral region was infiltrated with lidocaine for local analgesia.  A 5-cm incision was made over the left deltopectoral region.  A left subcutaneous defibrillator pocket was fashioned using a combination of sharp and blunt dissection.  Electrocautery was used to assure hemostasis.   RA/RV Lead Placement: The left axillary vein was cannulated with fluoroscopic visualization.  No contrast was required for this endeavor.  Through the left axillary vein, a St. Jude Medical Tendril SDX, model 1610RU-04  (serial  # [id number]  ) right atrial lead and a St. Jude Medical Starks, model 5409W-11 (serial number [id number]) right ventricular defibrillator lead were advanced with fluoroscopic visualization into the right atrial appendage and right ventricular apex positions respectively.  Initial atrial lead P-waves measured 5 mV with an impedance of 685 ohms and a threshold of 0.7 volts at 0.5 milliseconds.  The right ventricular lead R-wave measured 19.8 mV with impedance of 706 ohms and a threshold of 0.4 volts at 0.5 milliseconds.   LV Lead Placement: A Medtronic MB-2 guide was advanced through the left axillary vein into the low lateral right atrium.  A Bard curved Damato catheter was introduced through the MB-2 guide and used to cannulate the coronary sinus. A coronary sinus selective venography balloon was advanced through the MB- 2 guide and advanced into the proximal portion of the coronary sinus.  A selective coronary sinus venogram was performed by hand injection of nonionic contrast.  This demonstrated two moderate sized posterolateral coronary sinus branches.  No other posterior branches were identified.  A Whisper CSJ wire was introduced through the transseptal sheath and advanced into the distal posterolateral branch.  A St. Jude Medical Quartet model [phone] - 86 (serial number [id number]) lead was advanced through the MB-2 into the distal posterolateral branch.   This was  approximately one-thirds from the base to the apex in a very lateral  position.  In this location, the left ventricular lead impedance was 1400 ohms and a threshold of 1.6 volt at 0.5  milliseconds in the bipolar configuration with  no diaphragmatic  stimulation observed when pacing at 10 volts output.  The MB-2 guide was  therefore removed.     All three leads were secured to the pectoralis  fascia using #2 silk suture over the suture sleeves.  The pocket then  irrigated with copious gentamicin solution.  The leads were then  connected to a St.  [employer] model CD [phone] - [phone] (serial  Number [id number]) biventricular ICD.  The defibrillator was placed into the  pocket.  The pocket was then closed in 2 layers with 2.0 Vicryl suture  for the subcutaneous and subcuticular layers.  Steri-Strips and a  sterile dressing were then applied.   DFT Testing: Defibrillation Threshold testing was then performed. Ventricular fibrillation was induced with a T shock.  Adequate sensing of ventricular  fibrillation was observed with minimal dropout with a programmed sensitivity of 1.23mV.  The patient was successfully defibrillated to sinus rhythm with a single 15 joules shock delivered from the device with an impedance of 65 ohms in a duration of 3 seconds.  The patient remained in sinus rhythm thereafter.  There were no early apparent complications.  Programmed Extrastimulus testing:  Programmed extrastimulus testing was performed through the device with a basic cycle length of with S1,S2,S3,S4 extrastimuli down to refractoriness.  A right bundle branch right superior axis VT with CL284msec was induced with 500/280/240/260 msec).  An initial burst of ATP (85%) was unsuccessful, however a second ATP burst (85%) successfully terminated the tachycardia.  The procedure was therefore considered completed.  There were no early apparhent complications.     CONCLUSIONS:   1. Ischemic cardiomyopathy with Left bundle-branch block and chronic New York Heart Association class III heart failure.   2. Successful biventricular ICD implantation.   3. DFT less than or equal to 15 joules.   4. RBB Right Superior Axis with CL 275 msec induced with PES and successfully terminated with ATP therapy   5. No early apparent complications.

## 2011-04-01 NOTE — H&P (Signed)
Referring Physician: Dr Jens Som  PCP: none  [place] Service is a pleasant 70 y.o. patient with a h/o CAD, ischemic CM, and NYHA Class II/III CHF who presents today for EP consultation regarding risk stratefication of sudden death. He recently moved from [place] to this area. His cardiac history dates back to 2010 when he had his first myocardial infarction. He had stents placed in [place]. He had a second myocardial infarction in March of 2012 and then had coronary artery bypass and graft. He was again admitted in August of 2012. He ruled in for a small subendocardial myocardial infarction. An echocardiogram in August of 2012 showed an ejection fraction of 15%. There was a question of small left ventricular apical thrombus. There was also right atrial and right ventricular enlargement. There was mild mitral regurgitation. There was moderate aortic insufficiency. There is mild tricuspid regurgitation with moderately elevated pulmonary pressures. Cardiac catheterization was also performed in August of 2012. Ejection fraction was 20%. The right coronary and LAD were occluded and there was critical circumflex disease. There was a patent saphenous vein graft to the right coronary artery. The LIMA to the LAD was patent. The saphenous vein graft to the obtuse marginal was also patent.  Presently, he reports being sedentary. He feels that he could walk 10 blocks but does not do this very often. He reports dypnsea with about 5 blocks. He has significant pedal edema, which has increased since his recent visit to Dr Jens Som.  Today, he denies symptoms of palpitations, chest pain, dizziness, presyncope, syncope, or neurologic sequela. The patient is tolerating medications without difficulties and is otherwise without complaint today.   Past Medical History   Diagnosis  Date   .  Hypertension    .  Hyperlipidemia    .  Gout    .  CAD (coronary artery disease)    .  Ischemic cardiomyopathy    .  LBBB (left bundle  branch block)     Past Surgical History   Procedure  Date   .  Coronary artery bypass graft  3/12     in [place]   .  Tonsillectomy    .  Umbilical hernia repair    .  Inguinal hernia repair    .  Carpel tunnel surgery     Current Outpatient Prescriptions   Medication  Sig  Dispense  Refill   .  allopurinol (ZYLOPRIM) 100 MG tablet  Take 100 mg by mouth daily.     .  carvedilol (COREG) 6.25 MG tablet  Take 1 tablet (6.25 mg total) by mouth 2 (two) times daily.  60 tablet  11   .  ezetimibe (ZETIA) 10 MG tablet  Take 10 mg by mouth daily.     .  Multiple Vitamins-Minerals (CENTRUM SILVER PO)  Take 1 tablet by mouth daily.     .  Omega-3 Fatty Acids (FISH OIL) 1000 MG CAPS  Take by mouth 2 (two) times daily.     .  pravastatin (PRAVACHOL) 40 MG tablet  Take 1 tablet (40 mg total) by mouth every evening.  30 tablet  11   .  saw palmetto 160 MG capsule  Take 160 mg by mouth daily.     Marland Kitchen  telmisartan (MICARDIS) 80 MG tablet  Take 80 mg by mouth daily.     Marland Kitchen  aspirin 325 MG tablet  Take 325 mg by mouth daily.      Allergies   Allergen  Reactions   .  Statins     History    Social History   .  Marital Status:  Married     Spouse Name:  N/A     Number of Children:  4   .  Years of Education:  N/A    Occupational History   .       Retired    Social History Main Topics   .  Smoking status:  Never Smoker   .  Smokeless tobacco:  Not on file   .  Alcohol Use:  Yes      occasional   .  Drug Use:  No   .  Sexually Active:  Not on file    Other Topics  Concern   .  Not on file    Social History Narrative    Lives in [place], recently moved from [place]. 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    FH- denies FH of CAD   ROS- All systems are reviewed and negative except as per the HPI above   Physical Exam:  Filed Vitals:    02/04/11 1141   BP:  128/80   Pulse:  72   Height:  5\' 6"  (1.676 m)   Weight:  175 lb 1.9 oz (79.434 kg)    GEN- The patient is well appearing, alert and oriented  x 3 today.  Head- normocephalic, atraumatic  Eyes- Sclera clear, conjunctiva pink  Ears- hearing intact  Oropharynx- clear  Neck- supple, JVP 9cm  Lymph- no cervical lymphadenopathy  Lungs- Clear to ausculation bilaterally, normal work of breathing  Heart- Regular rate and rhythm, no murmurs, rubs or gallops, PMI not laterally displaced  GI- soft, NT, ND, + BS  Extremities- no clubbing, cyanosis, 2+ L>R edema  MS- no significant deformity or atrophy  Skin- no rash or lesion  Psych- euthymic mood, full affect  Neuro- strength and sensation are intact  EKG 01/14/11- sinus rhythm 73 bpm, PR 288, LBBB (QRS )   Assessment and Plan:   Ischemic cardiomyopathy - Hillis Range, MD 02/04/2011 1:35 PM Signed  The patient has CAD, ischemic CM (EF 20%), and NYHA CLass III CHF. He has a LBBB with QRS . He has been treated with good medical therapy. At this time, he meets MADIT II/ SCD-HeFT criteria for ICD implantation for primary prevention of sudden death. Given his LBBB and QRS of , he also has class IIa indication for CRT. Risks, benefits, alternatives to BiVICD implantation were discussed in detail with the patient today. The patient understands that the risks include but are not limited to bleeding, infection, pneumothorax, perforation, tamponade, vascular damage, renal failure, MI, stroke, death, inappropriate shocks, and lead dislodgement and wishes to proceed.  We will therefore plan BIV ICD later today.

## 2011-04-01 NOTE — Brief Op Note (Signed)
04/01/2011  12:15 PM  PATIENT:  Dave Daniels  70 y.o. male  PRE-OPERATIVE DIAGNOSIS:  Chronic systolic dysfunction, ischemic cardiomyopathy  POST-OPERATIVE DIAGNOSIS:  Chronic systolic dysfunction, ischemic cardiomyopathy  PROCEDURE:  Procedure(s): BI-VENTRICULAR IMPLANTABLE CARDIOVERTER DEFIBRILLATOR  (CRT-D)  SURGEON:  Hillis Range, MD   ASSISTANTS: none   ANESTHESIA:   local  EBL:   3cc  BLOOD ADMINISTERED: none  DRAINS: none   LOCAL MEDICATIONS USED:  LIDOCAINE 5CC  SPECIMEN:  No Specimen  DISPOSITION OF SPECIMEN:  N/A  COUNTS:  YES  TOURNIQUET:  * No tourniquets in log *  DICTATION: .Note written in EPIC  PLAN OF CARE: Admit for overnight observation  PATIENT DISPOSITION:  PACU - hemodynamically stable.   Delay start of Pharmacological VTE agent (>24hrs) due to surgical blood loss or risk of bleeding:  {YES/NO/NOT APPLICABLE:20182

## 2011-04-02 ENCOUNTER — Ambulatory Visit (HOSPITAL_COMMUNITY): Payer: Medicare Other

## 2011-04-02 ENCOUNTER — Encounter (HOSPITAL_COMMUNITY): Payer: Self-pay | Admitting: Physician Assistant

## 2011-04-02 DIAGNOSIS — I2589 Other forms of chronic ischemic heart disease: Secondary | ICD-10-CM

## 2011-04-02 MED ORDER — EZETIMIBE 10 MG PO TABS: 10.0000 mg | ORAL_TABLET | Freq: Every day | ORAL | Status: DC

## 2011-04-02 NOTE — Progress Notes (Signed)
SUBJECTIVE: The patient is doing well today s/p BIV ICD implantation.  At this time, he denies chest pain, shortness of breath, or any new concerns.     Marland Kitchen acetaminophen  1,000 mg Oral Q6H  . allopurinol  100 mg Oral Daily  . carvedilol  6.25 mg Oral BID WC  . ceFAZolin (ANCEF) IV  1 g Intravenous Q6H  . ezetimibe  10 mg Oral Daily  . fentaNYL      . heparin      . lidocaine      . lisinopril  5 mg Oral Daily  . midazolam      . midazolam      . mupirocin ointment   Nasal BID  . mupirocin ointment      . sodium chloride  3 mL Intravenous Q12H  . DISCONTD: ceFAZolin (ANCEF) IV  1 g Intravenous On Call  . DISCONTD: chlorhexidine  60 mL Topical Once  . DISCONTD: gentamicin irrigation  80 mg Irrigation On Call      . DISCONTD: sodium chloride 50 mL/hr at 04/01/11 0950  . DISCONTD: sodium chloride      OBJECTIVE: Physical Exam: Filed Vitals:   04/01/11 1500 04/01/11 1958 04/02/11 0014 04/02/11 0500  BP: 99/61 104/68 101/58 119/74  Pulse: 62 60 61   Temp: 98.1 F (36.7 C) 97.5 F (36.4 C) 98.2 F (36.8 C) 98.2 F (36.8 C)  TempSrc: Oral Oral Oral Oral  Resp: 14 11 14 13   Height:      Weight:   163 lb 9.3 oz (74.2 kg)   SpO2:  97% 95% 97%    Intake/Output Summary (Last 24 hours) at 04/02/11 0813 Last data filed at 04/02/11 0324  Gross per 24 hour  Intake    100 ml  Output      0 ml  Net    100 ml    Telemetry reveals sinus rhythm  With BiV pacing  GEN- The patient is well appearing, alert and oriented x 3 today.   Head- normocephalic, atraumatic Eyes-  Sclera clear, conjunctiva pink Ears- hearing intact Oropharynx- clear Neck- supple, no JVP Lymph- no cervical lymphadenopathy Lungs- Clear to ausculation bilaterally, normal work of breathing Heart- Regular rate and rhythm, no murmurs, rubs or gallops, PMI not laterally displaced GI- soft, NT, ND, + BS Extremities- no clubbing, cyanosis, or edema Skin- no rash or lesion Psych- euthymic mood, full  affect Neuro- strength and sensation are intact ICD pocket without hematoma or problems  ekg sinus, biv paced ICD interrogation today is reviewed on chart and is normal  ASSESSMENT AND PLAN:  Active Problems:  Coronary artery disease  Ischemic cardiomyopathy  Hypertension  Doing well s/p BiV ICD Discharge to home Wound check in 10 days Follow-up with me in 3 months Follow-up with Dr Jens Som in 6 weeks.   Hillis Range, MD 04/02/2011 8:13 AM

## 2011-04-02 NOTE — Progress Notes (Signed)
Pt heart rate increased to 105-110 at rest and as of 0431 is V-paced (AV-pacing was occuring earlier throughout the shift).  Pt is asymptomatic.  Will continue to assess.

## 2011-04-02 NOTE — Plan of Care (Signed)
Problem: Discharge Progression Outcomes Goal: No evidence of pacemaker malfunction Outcome: Completed/Met Date Met:  04/02/11 Checked by pacemaker rep and reviewed by Dr. Johney Frame  Problem: Phase III Progression Outcomes Goal: Ambulating in room or hall Outcome: Completed/Met Date Met:  04/02/11 Ambulated in halls with no s/s intolerance

## 2011-04-02 NOTE — Discharge Summary (Signed)
Discharge Summary   Patient ID: Dave Daniels MRN: [id number], DOB/AGE: 14-1942 70 y.o. Admit date: 04/01/2011 D/C date:     04/02/2011   Primary Discharge Diagnoses:  1. Ischemic cardiomyopathy with EF of 15% by echo 11/2010, still 20-25% by echo 02/2011 - s/p St. Jude Bi-V ICD implantation 04/01/11 2. LBBB 3. Question of apical thrombus by echo 02/2011  Secondary Discharge Diagnoses:  1. CAD - s/p MI 2010 with stenting - s/p MI with subsequent CABG in [place] 06/2010 - s/p small subendocardial MI 11/2010 2. HTN 3. HL 4. Gout 5. Renal insufficiency (Cr 1.6 03/25/11) 6. Tonsillectomy 7. Umbilical hernia repair 8. Inguinal hernia repair 9. Carpal tunnel surgery  Hospital Course: [name] is a 70 y/o M with hx CAD, ischemic CM, and NYHA Class II/III CHF who presented 04/01/11 for EP consultation regarding risk stratefication of sudden death. He recently moved from [place] to this area. His cardiac history dates back to 2010 when he had his first myocardial infarction. He had stents placed in [place]. He had a second myocardial infarction in March of 2012 and then had CABG. He was again admitted in August of 2012 & ruled in for a small subendocardial myocardial infarction. An echocardiogram in August of 2012 showed an ejection fraction of 15% with question of small left ventricular apical thrombus. Cardiac catheterization was also performed with EF 20%. The right coronary and LAD were occluded and there was critical circumflex disease. There was a patent saphenous vein graft to the right coronary artery. The LIMA to the LAD was patent. The saphenous vein graft to the obtuse marginal was also patent. He reported dyspnea after about 5 blocks to Dr. Johney Frame. He had significant pedal edema as well. He denied any palpitations, chest pain, dizziness, presyncope, syncope, or neurologic sequela. He had a LBBB with QRS . He has been treated with good medical therapy. He was felt to meet MADIT  II/SCD-HeFT criteria for ICD implantation for primary prevention of sudden death with a class IIa indication for CRT given LBBB QRS . Risks, benefits and alternatives were discussed with the patient who agreed to proceed. He underwent Bi-V ICD implantation with a St. Jude device yesterday. Programmed extrastimulus testing was performed through the device with a basic cycle length of with S1,S2,S3,S4 extrastimuli down to refractoriness. A right bundle branch right superior axis VT with CL236msec was induced with 500/280/240/260 msec). An initial burst of ATP (85%) was unsuccessful, however a second ATP burst (85%) successfully terminated the tachycardia. The procedure was therefore considered completed. There were no early apparent complications. CXR this morning shows no pneumothorax. Dr. Johney Frame has seen & examined him today and feels he is stable for discharge. Per discussion with Dr. Johney Frame, he will be continued on his home medicines. Of note, there was question of small mural thrombus by echo 02/2011 - I discussed this with Dr. Johney Frame in terms of further intervention and he would like to defer it to Dr. Jens Som for further eval and feels no intervention is necessary at present. I have sent a note to him in EPIC regarding the matter.  Discharge Vitals: Blood pressure 124/76, pulse 60, temperature 98.3 F (36.8 C), temperature source Oral, resp. rate 18, height 5\' 7"  (1.702 m), weight 163 lb 9.3 oz (74.2 kg), SpO2 98.00%.  Labs: Lab Results  Component Value Date   WBC 8.6 03/25/2011   HGB 14.3 03/25/2011   HCT 43.3 03/25/2011   MCV 85.7 03/25/2011   PLT 208.0  03/25/2011   Lab Results  Component Value Date   CHOL 141 02/26/2011   HDL 50.10 02/26/2011   LDLCALC 80 02/26/2011   TRIG 57.0 02/26/2011     Diagnostic Studies/Procedures   1. Chest 2 View 04/02/2011  *RADIOLOGY REPORT*  Clinical Data: Pacemaker placement.  CHEST - 2 VIEW  Comparison: None.  Findings: Cardiomegaly.   Median sternotomy.  Left subclavian three lead pacemaker apparatus with coronary sinus lead.  Leads appear well positioned.  No pneumothorax is identified.  Blunting of the costophrenic angles on the lateral view may be due to a small amount of pleural fluid or chronic.  Atelectasis projects over the lower thoracic spine on the lateral view.  IMPRESSION: Uncomplicated left subclavian three lead cardiac pacemaker placement.  Cardiomegaly without failure.  Original Report Authenticated By: Andreas Newport, M.D.   2. Bi-V ICD implantation 04/01/11   Discharge Medications   Current Discharge Medication List    CONTINUE these medications which have NOT CHANGED   Details  allopurinol (ZYLOPRIM) 100 MG tablet Take 1 tablet (100 mg total) by mouth daily. Qty: 30 tablet, Refills: 3    aspirin 81 MG tablet Take 81 mg by mouth daily.      carvedilol (COREG) 6.25 MG tablet Take 1 tablet (6.25 mg total) by mouth 2 (two) times daily. Qty: 60 tablet, Refills: 11   Associated Diagnoses: Other specified forms of chronic ischemic heart disease; Coronary atherosclerosis of native coronary artery; Essential hypertension, benign    Coenzyme Q10 (COQ-10 PO) Take 1 capsule by mouth 2 (two) times daily.      furosemide (LASIX) 40 MG tablet Take 1 tablet (40 mg total) by mouth daily. Qty: 30 tablet, Refills: 11    lisinopril (PRINIVIL,ZESTRIL) 5 MG tablet Take 1 tablet (5 mg total) by mouth daily. Qty: 30 tablet, Refills: 11    Multiple Vitamins-Minerals (CENTRUM SILVER PO) Take 1 tablet by mouth 2 (two) times daily. Take 1 tablet every evening & at bedtime    Omega-3 Fatty Acids (FISH OIL) 1000 MG CAPS Take 1,000 mg by mouth at bedtime.     potassium chloride SA (K-DUR,KLOR-CON) 20 MEQ tablet Take 1 tablet (20 mEq total) by mouth daily. Qty: 30 tablet, Refills: 11    pravastatin (PRAVACHOL) 40 MG tablet Take 1 tablet (40 mg total) by mouth every evening. Qty: 30 tablet, Refills: 11   Associated  Diagnoses: Other specified forms of chronic ischemic heart disease; Coronary atherosclerosis of native coronary artery; Essential hypertension, benign    Saw Palmetto, Serenoa repens, 450 MG CAPS Take 450 mg by mouth 2 (two) times daily.        STOP taking these medications     ezetimibe (ZETIA) 10 MG tablet       The patient states he was already taken off Zetia months ago after moving from [place].  Disposition   The patient will be discharged in stable condition to home. Discharge Orders    Future Appointments: Provider: Department: Dept Phone: Center:   04/16/2011 10:30 AM Vella Kohler [facility] [place] [phone] [facility]   05/18/2011 8:30 AM Lewayne Bunting, MD [facility] [phone] [facility]   07/09/2011 9:15 AM Gardiner Rhyme, MD [facility] [phone] [facility]     Future Orders Please Complete By Expires   Diet - low sodium heart healthy      Increase activity slowly      Comments:   Please see attached sheet for instructions on wound care, activity, and bathing.  Follow-up Information    Follow up with [facility]. (04/16/11 at 10:30am)    Contact information:   [address] [place] [address]       Follow up with Olga Millers, MD. (05/17/10 at 8:30am)    Contact information:   [address] [place], [address] [place] [address] [phone]       Follow up with Hillis Range, MD. (07/09/11 at 9:15am)    Contact information:   [address] [place] [address] [phone]            Duration of Discharge Encounter: Greater than 30 minutes including physician and PA time.  Signed, Ronie Spies PA-C 04/02/2011, 9:27 AM  I have seen, examined the patient, and reviewed the above discharge assessment and plan.   Co Sign: Hillis Range, MD 04/02/2011 8:21 PM

## 2011-04-16 ENCOUNTER — Encounter: Payer: Self-pay | Admitting: Internal Medicine

## 2011-04-16 ENCOUNTER — Ambulatory Visit (INDEPENDENT_AMBULATORY_CARE_PROVIDER_SITE_OTHER): Payer: Medicare Other | Admitting: *Deleted

## 2011-04-16 DIAGNOSIS — I2589 Other forms of chronic ischemic heart disease: Secondary | ICD-10-CM

## 2011-04-16 DIAGNOSIS — I428 Other cardiomyopathies: Secondary | ICD-10-CM

## 2011-04-16 DIAGNOSIS — I255 Ischemic cardiomyopathy: Secondary | ICD-10-CM

## 2011-04-16 LAB — ICD DEVICE OBSERVATION
AL AMPLITUDE: 4.1 mv
AL IMPEDENCE ICD: 437.5 Ohm
ATRIAL PACING ICD: 18 pct
BAMS-0001: 150 {beats}/min
DEVICE MODEL ICD: 7016547
FVT: 0
LV LEAD THRESHOLD: 1.5 V
MODE SWITCH EPISODES: 1
RV LEAD AMPLITUDE: 12 mv
RV LEAD THRESHOLD: 0.625 V
TZAT-0001SLOWVT: 1
TZON-0010SLOWVT: 40 ms
TZST-0001SLOWVT: 2
TZST-0003SLOWVT: 36 J
VENTRICULAR PACING ICD: 99.83 pct
VF: 0

## 2011-04-16 NOTE — Progress Notes (Signed)
Wound check defib in clinic  

## 2011-05-18 ENCOUNTER — Encounter: Payer: Medicare Other | Admitting: Cardiology

## 2011-05-20 ENCOUNTER — Encounter: Payer: Self-pay | Admitting: Cardiology

## 2011-05-20 ENCOUNTER — Ambulatory Visit (INDEPENDENT_AMBULATORY_CARE_PROVIDER_SITE_OTHER): Payer: Medicare Other | Admitting: Cardiology

## 2011-05-20 DIAGNOSIS — Z4502 Encounter for adjustment and management of automatic implantable cardiac defibrillator: Secondary | ICD-10-CM

## 2011-05-20 DIAGNOSIS — E785 Hyperlipidemia, unspecified: Secondary | ICD-10-CM

## 2011-05-20 DIAGNOSIS — I10 Essential (primary) hypertension: Secondary | ICD-10-CM

## 2011-05-20 DIAGNOSIS — I2510 Atherosclerotic heart disease of native coronary artery without angina pectoris: Secondary | ICD-10-CM

## 2011-05-20 DIAGNOSIS — I2589 Other forms of chronic ischemic heart disease: Secondary | ICD-10-CM

## 2011-05-20 MED ORDER — CARVEDILOL 12.5 MG PO TABS: 12.5000 mg | ORAL_TABLET | Freq: Two times a day (BID) | ORAL | Status: DC

## 2011-05-20 NOTE — Assessment & Plan Note (Signed)
Continue statin. Check lipids and liver. 

## 2011-05-20 NOTE — Assessment & Plan Note (Addendum)
Continue ACE inhibitor. Increase Coreg to 12.5 mg by mouth twice a day. If he has worsening palpitations in the future we will plan further evaluation at that time. There is a question of LV apical thrombus on previous echocardiogram. Repeat study with contrast to exclude. This will be a limited echocardiogram.

## 2011-05-20 NOTE — Patient Instructions (Signed)
Your physician wants you to follow-up in: 6 MONTHS You will receive a reminder letter in the mail two months in advance. If you don't receive a letter, please call our office to schedule the follow-up appointment.   Your physician has requested that you have an echocardiogram. Echocardiography is a painless test that uses sound waves to create images of your heart. It provides your doctor with information about the size and shape of your heart and how well your heart's chambers and valves are working. This procedure takes approximately one hour. There are no restrictions for this procedure.   Your physician recommends that you return for lab work in: WITH ECHO  INCREASE CARVEDILOL TO 12.5 MG TWICE DAILY

## 2011-05-20 NOTE — Assessment & Plan Note (Signed)
Management per electrophysiology. 

## 2011-05-20 NOTE — Assessment & Plan Note (Signed)
Continue aspirin and statin. 

## 2011-05-20 NOTE — Assessment & Plan Note (Signed)
Increase carvedilol to 12.5 mg by mouth twice a day for ischemic cardiomyopathy. Check potassium and renal function.

## 2011-05-20 NOTE — Progress Notes (Signed)
HPI: Pleasant male with past medical history of coronary artery disease status post coronary artery bypass and graft as well as ischemic cardiomyopathy for FU. Patient's cardiac history dates back to 2010 when he had his first myocardial infarction. He had stents placed in [place]. He had a second myocardial infarction in March of 2012 and then had coronary artery bypass and graft. He was again admitted in August of 2012. He ruled in for a small subendocardial myocardial infarction. Cardiac catheterization in August of 2012 showed EF of 20%. The right coronary and LAD were occluded and there was critical circumflex disease. There was a patent saphenous vein graft to the right coronary artery. Repeat echo in Nov 2012 showed ejection fraction 20-25%. There was severe LVE. Small apical thrombus versus trabeculations. There was mild aortic and mitral regurgitation. The left atrium was moderately dilated. Patient had biventricular ICD placed in December of 2012. Since then, he denies dyspnea on exertion, orthopnea, PND or exertional chest pain. Last evening he did have an episode of "elevated heart rate". His heart rate increased to approximately 110. It resolved spontaneously. His ICD did not fire.   Current Outpatient Prescriptions  Medication Sig Dispense Refill  . allopurinol (ZYLOPRIM) 100 MG tablet Take 1 tablet (100 mg total) by mouth daily.  30 tablet  3  . aspirin 81 MG tablet Take 81 mg by mouth daily.        . carvedilol (COREG) 6.25 MG tablet Take 1 tablet (6.25 mg total) by mouth 2 (two) times daily.  60 tablet  11  . Coenzyme Q10 (COQ-10 PO) Take 1 capsule by mouth 2 (two) times daily.        . furosemide (LASIX) 40 MG tablet Take 1 tablet (40 mg total) by mouth daily.  30 tablet  11  . lisinopril (PRINIVIL,ZESTRIL) 5 MG tablet Take 1 tablet (5 mg total) by mouth daily.  30 tablet  11  . Multiple Vitamins-Minerals (CENTRUM SILVER PO) Take 1 tablet by mouth 2 (two) times daily. Take 1 tablet  every evening & at bedtime      . Omega-3 Fatty Acids (FISH OIL) 1000 MG CAPS Take 1,000 mg by mouth at bedtime.       . potassium chloride SA (K-DUR,KLOR-CON) 20 MEQ tablet Take 1 tablet (20 mEq total) by mouth daily.  30 tablet  11  . pravastatin (PRAVACHOL) 40 MG tablet Take 1 tablet (40 mg total) by mouth every evening.  30 tablet  11  . Saw Palmetto, Serenoa repens, 450 MG CAPS Take 450 mg by mouth 2 (two) times daily.           Past Medical History  Diagnosis Date  . Hypertension   . Hyperlipidemia   . Gout   . CAD (coronary artery disease)     MI in [place] with stenting in 2010, then MI with CABG in [facility] 06/2010.  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  . Ischemic cardiomyopathy     EF 15% by echo 11/2010 and still 20-25% by follow-up echo 02/2011, s/p St. Jude Bi-V ICD implantation 04/01/11  . LBBB (left bundle branch block)   . Renal insufficiency     Cr 1.6 on 03/25/11    Past Surgical History  Procedure Date  . Coronary artery bypass graft 3/12    in [place]  . Tonsillectomy   . Umbilical hernia repair   . Inguinal hernia repair   . Carpel tunnel surgery     History   Social History  .  Marital Status: Married    Spouse Name: N/A    Number of Children: 4  . Years of Education: N/A   Occupational History  .      Retired   Social History Main Topics  . Smoking status: Never Smoker   . Smokeless tobacco: Never Used  . Alcohol Use: Yes     occasional  . Drug Use: No  . Sexually Active: Yes   Other Topics Concern  . 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 Remaining systems are negative.  Physical Exam: Well-developed well-nourished in no acute distress.  Skin is warm and dry.  HEENT is normal.    Neck is supple. No thyromegaly.  Chest is clear to auscultation with normal expansion. ICD left chest Cardiovascular exam is regular rate and rhythm.  Abdominal exam nontender or distended. No masses palpated. Extremities show no edema. neuro grossly intact  ECG sinus rhythm with ventricular pacing.

## 2011-05-22 NOTE — Progress Notes (Signed)
Addended by: Judithe Modest D on: 05/22/2011 11:14 AM   Modules accepted: Orders

## 2011-05-28 ENCOUNTER — Other Ambulatory Visit: Payer: Medicare Other | Admitting: *Deleted

## 2011-05-28 ENCOUNTER — Ambulatory Visit (HOSPITAL_COMMUNITY): Payer: Medicare Other | Attending: Cardiology | Admitting: Radiology

## 2011-05-28 ENCOUNTER — Other Ambulatory Visit (HOSPITAL_COMMUNITY): Payer: Self-pay | Admitting: *Deleted

## 2011-05-28 DIAGNOSIS — I428 Other cardiomyopathies: Secondary | ICD-10-CM | POA: Insufficient documentation

## 2011-05-28 DIAGNOSIS — I252 Old myocardial infarction: Secondary | ICD-10-CM | POA: Insufficient documentation

## 2011-05-28 DIAGNOSIS — E785 Hyperlipidemia, unspecified: Secondary | ICD-10-CM | POA: Insufficient documentation

## 2011-05-28 DIAGNOSIS — I10 Essential (primary) hypertension: Secondary | ICD-10-CM | POA: Insufficient documentation

## 2011-05-28 DIAGNOSIS — I2510 Atherosclerotic heart disease of native coronary artery without angina pectoris: Secondary | ICD-10-CM | POA: Insufficient documentation

## 2011-05-28 DIAGNOSIS — I2589 Other forms of chronic ischemic heart disease: Secondary | ICD-10-CM | POA: Insufficient documentation

## 2011-05-28 DIAGNOSIS — I447 Left bundle-branch block, unspecified: Secondary | ICD-10-CM | POA: Insufficient documentation

## 2011-05-28 MED ORDER — PERFLUTREN LIPID MICROSPHERE 6.52 MG/ML IV SUSP
2.0000 uL/kg | Freq: Once | INTRAVENOUS | Status: AC
  Administered 2011-05-28: 0.165 mg via INTRAVENOUS

## 2011-05-29 LAB — LIPID PANEL
Cholesterol: 196 mg/dL (ref 0–200)
LDL Cholesterol: 131 mg/dL — ABNORMAL HIGH (ref 0–99)
Triglycerides: 120 mg/dL (ref <150)

## 2011-05-29 LAB — HEPATIC FUNCTION PANEL
Alkaline Phosphatase: 95 U/L (ref 39–117)
Indirect Bilirubin: 0.8 mg/dL (ref 0.0–0.9)
Total Protein: 7.2 g/dL (ref 6.0–8.3)

## 2011-06-03 ENCOUNTER — Telehealth: Payer: Self-pay | Admitting: Cardiology

## 2011-06-03 DIAGNOSIS — I10 Essential (primary) hypertension: Secondary | ICD-10-CM

## 2011-06-03 DIAGNOSIS — I2589 Other forms of chronic ischemic heart disease: Secondary | ICD-10-CM

## 2011-06-03 DIAGNOSIS — I2510 Atherosclerotic heart disease of native coronary artery without angina pectoris: Secondary | ICD-10-CM

## 2011-06-03 MED ORDER — PRAVASTATIN SODIUM 80 MG PO TABS: 80.0000 mg | ORAL_TABLET | Freq: Every evening | ORAL | Status: DC

## 2011-06-03 NOTE — Telephone Encounter (Signed)
Addended by: Freddi Starr on: 06/03/2011 01:55 PM   Modules accepted: Orders

## 2011-06-03 NOTE — Telephone Encounter (Signed)
New problem    Patient returning call back to nurse.   

## 2011-06-03 NOTE — Telephone Encounter (Signed)
Spoke with pt, aware of labs. He can not do crestor he has to be on a generic for insurance. Per dr Jens Som he will increase pravachol to 80 mg. Orders mailed to pt for labs to be repeated in 6-8 weeks.

## 2011-07-09 ENCOUNTER — Encounter: Payer: Self-pay | Admitting: Internal Medicine

## 2011-07-09 ENCOUNTER — Ambulatory Visit (INDEPENDENT_AMBULATORY_CARE_PROVIDER_SITE_OTHER): Payer: Medicare Other | Admitting: Internal Medicine

## 2011-07-09 VITALS — BP 115/70 | HR 60 | Ht 67.0 in | Wt 169.5 lb

## 2011-07-09 DIAGNOSIS — I2589 Other forms of chronic ischemic heart disease: Secondary | ICD-10-CM

## 2011-07-09 DIAGNOSIS — I5023 Acute on chronic systolic (congestive) heart failure: Secondary | ICD-10-CM

## 2011-07-09 DIAGNOSIS — I255 Ischemic cardiomyopathy: Secondary | ICD-10-CM

## 2011-07-09 DIAGNOSIS — Z4502 Encounter for adjustment and management of automatic implantable cardiac defibrillator: Secondary | ICD-10-CM

## 2011-07-09 LAB — ICD DEVICE OBSERVATION
AL THRESHOLD: 0.75 V
ATRIAL PACING ICD: 34 pct
BAMS-0001: 150 {beats}/min
FVT: 0
LV LEAD IMPEDENCE ICD: 487.5 Ohm
LV LEAD THRESHOLD: 1.375 V
PACEART VT: 0
RV LEAD AMPLITUDE: 12 mv
RV LEAD THRESHOLD: 0.5 V
TZAT-0001SLOWVT: 1
TZAT-0004SLOWVT: 8
TZON-0010SLOWVT: 40 ms
TZST-0001SLOWVT: 3
TZST-0003SLOWVT: 36 J
VENTRICULAR PACING ICD: 99.64 pct

## 2011-07-09 NOTE — Assessment & Plan Note (Signed)
Stable No change required today Normal BiV ICD function See Pace Art report No changes today  

## 2011-07-09 NOTE — Patient Instructions (Signed)
Your physician wants you to follow-up in: Dec 2013 with Dr Johney Frame Bonita Quin will receive a reminder letter in the mail two months in advance. If you don't receive a letter, please call our office to schedule the follow-up appointment.    Remote monitoring is used to monitor your Pacemaker of ICD from home. This monitoring reduces the number of office visits required to check your device to one time per year. It allows [place] to keep an eye on the functioning of your device to ensure it is working properly. You are scheduled for a device check from home on 10/16/2011. You may send your transmission at any time that day. If you have a wireless device, the transmission will be sent automatically. After your physician reviews your transmission, you will receive a postcard with your next transmission date.

## 2011-07-09 NOTE — Progress Notes (Signed)
PCP:  Doreatha Martin, MD, MD Primary Cardiologist:  Dr Jens Som  The patient presents today for routine electrophysiology followup.  Since having his BiV ICD implanted, the patient reports doing very well.  Today, he denies symptoms of palpitations, chest pain, shortness of breath, orthopnea, PND, lower extremity edema, dizziness, presyncope, syncope, or neurologic sequela.  The patient feels that he is tolerating medications without difficulties and is otherwise without complaint today.   Past Medical History  Diagnosis Date  . Hypertension   . Hyperlipidemia   . Gout   . CAD (coronary artery disease)     MI in [place] with stenting in 2010, then MI with CABG in [facility] 06/2010.  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  . Ischemic cardiomyopathy     EF 15% by echo 11/2010 and still 20-25% by follow-up echo 02/2011, s/p St. Jude Bi-V ICD implantation 04/01/11  . LBBB (left bundle branch block)   . Renal insufficiency     Cr 1.6 on 03/25/11   Past Surgical History  Procedure Date  . Coronary artery bypass graft 3/12    in [place]  . Tonsillectomy   . Umbilical hernia repair   . Inguinal hernia repair   . Carpel tunnel surgery   . Cardiac defibrillator placement 12/12    BiV ICD (SJM) implanted by Dr Johney Frame    Current Outpatient Prescriptions  Medication Sig Dispense Refill  . allopurinol (ZYLOPRIM) 100 MG tablet Take 1 tablet (100 mg total) by mouth daily.  30 tablet  3  . aspirin 81 MG tablet Take 81 mg by mouth daily.        . carvedilol (COREG) 12.5 MG tablet Take 1 tablet (12.5 mg total) by mouth 2 (two) times daily.  60 tablet  11  . Coenzyme Q10 (COQ-10 PO) Take 1 capsule by mouth 2 (two) times daily.        . furosemide (LASIX) 40 MG tablet Take 1 tablet (40 mg total) by mouth daily.  30 tablet  11  . lisinopril (PRINIVIL,ZESTRIL) 5 MG tablet Take 1 tablet (5 mg total) by mouth daily.  30 tablet  11  . Multiple Vitamins-Minerals (CENTRUM SILVER PO) Take 1 tablet by mouth 2 (two) times  daily. Take 1 tablet every evening & at bedtime      . Omega-3 Fatty Acids (FISH OIL) 1000 MG CAPS Take 1,000 mg by mouth at bedtime.       . potassium chloride SA (K-DUR,KLOR-CON) 20 MEQ tablet Take 1 tablet (20 mEq total) by mouth daily.  30 tablet  11  . pravastatin (PRAVACHOL) 80 MG tablet Take 1 tablet (80 mg total) by mouth every evening.  30 tablet  11  . Saw Palmetto, Serenoa repens, 450 MG CAPS Take 450 mg by mouth 2 (two) times daily.          Allergies  Allergen Reactions  . Statins Other (See Comments)    Muscle pain & severe hives    History   Social History  . Marital Status: Married    Spouse Name: N/A    Number of Children: 4  . Years of Education: N/A   Occupational History  .      Retired   Social History Main Topics  . Smoking status: Never Smoker   . Smokeless tobacco: Never Used  . Alcohol Use: Yes     occasional  . Drug Use: No  . Sexually Active: Yes   Other Topics Concern  . 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   Head- normocephalic, atraumatic Eyes-  Sclera clear, conjunctiva pink Ears- hearing intact Oropharynx- clear Neck- supple, no JVP Lymph- no cervical lymphadenopathy Lungs- Clear to ausculation bilaterally, normal work of breathing Chest- ICD pocket is well healed Heart- Regular rate and rhythm, no murmurs, rubs or gallops, PMI not laterally displaced GI- soft, NT, ND, + BS Extremities- no clubbing, cyanosis, or edema MS- no significant deformity or atrophy Skin- no rash or lesion Psych- euthymic mood, full affect Neuro- strength and sensation are intact  ICD interrogation- reviewed in detail today,  See PACEART report  Assessment and Plan:

## 2011-08-11 ENCOUNTER — Telehealth: Payer: Self-pay | Admitting: Internal Medicine

## 2011-08-11 NOTE — Telephone Encounter (Signed)
They want to know if pt can have an ultrasonic cleaning done

## 2011-08-11 NOTE — Telephone Encounter (Signed)
Left message for Dave Daniels.

## 2011-08-11 NOTE — Telephone Encounter (Signed)
Left message for [facility] @ Dr. Duaine Dredge office.  Ultrasonic cleaning is ok with ICD to my knowledge but they can call tech support SJM [phone] for any questions.

## 2011-10-16 ENCOUNTER — Encounter: Payer: Medicare Other | Admitting: *Deleted

## 2011-10-23 ENCOUNTER — Encounter: Payer: Self-pay | Admitting: *Deleted

## 2011-12-09 ENCOUNTER — Encounter: Payer: Self-pay | Admitting: *Deleted

## 2011-12-31 ENCOUNTER — Ambulatory Visit (INDEPENDENT_AMBULATORY_CARE_PROVIDER_SITE_OTHER): Payer: Medicare Other | Admitting: *Deleted

## 2011-12-31 DIAGNOSIS — I255 Ischemic cardiomyopathy: Secondary | ICD-10-CM

## 2011-12-31 DIAGNOSIS — Z4502 Encounter for adjustment and management of automatic implantable cardiac defibrillator: Secondary | ICD-10-CM

## 2011-12-31 DIAGNOSIS — I5023 Acute on chronic systolic (congestive) heart failure: Secondary | ICD-10-CM

## 2011-12-31 DIAGNOSIS — I2589 Other forms of chronic ischemic heart disease: Secondary | ICD-10-CM

## 2012-01-01 ENCOUNTER — Encounter: Payer: Self-pay | Admitting: Internal Medicine

## 2012-01-01 ENCOUNTER — Encounter: Payer: Self-pay | Admitting: *Deleted

## 2012-01-01 LAB — REMOTE ICD DEVICE
AL IMPEDENCE ICD: 490 Ohm
BAMS-0001: 150 {beats}/min
BAMS-0003: 70 {beats}/min
DEVICE MODEL ICD: 7016547
LV LEAD IMPEDENCE ICD: 430 Ohm
RV LEAD IMPEDENCE ICD: 410 Ohm
RV LEAD THRESHOLD: 0.625 V
TZAT-0004SLOWVT: 8
TZAT-0012SLOWVT: 200 ms
TZAT-0013SLOWVT: 2
TZAT-0020SLOWVT: 1 ms
TZON-0005SLOWVT: 6
TZST-0001SLOWVT: 3
TZST-0003SLOWVT: 36 J
TZST-0003SLOWVT: 40 J
VENTRICULAR PACING ICD: 100 pct

## 2012-01-06 ENCOUNTER — Telehealth: Payer: Self-pay | Admitting: *Deleted

## 2012-01-06 NOTE — Telephone Encounter (Signed)
Staff message sent to provider regarding CorVue value being elevated on recent device check. Spoke with pt, he denies SOB or edema. Per dr Jens Som the pt was instructed to take extra 40 mg of lasix with any SOB or edema. Pt voiced understanding.

## 2012-01-20 ENCOUNTER — Encounter: Payer: Self-pay | Admitting: *Deleted

## 2012-02-16 ENCOUNTER — Other Ambulatory Visit: Payer: Self-pay | Admitting: Cardiology

## 2012-03-27 ENCOUNTER — Other Ambulatory Visit: Payer: Self-pay | Admitting: Cardiology

## 2012-04-18 ENCOUNTER — Encounter: Payer: Self-pay | Admitting: Internal Medicine

## 2012-04-18 ENCOUNTER — Ambulatory Visit (INDEPENDENT_AMBULATORY_CARE_PROVIDER_SITE_OTHER): Payer: Medicare Other | Admitting: Internal Medicine

## 2012-04-18 VITALS — BP 116/68 | HR 60 | Ht 67.0 in | Wt 170.0 lb

## 2012-04-18 DIAGNOSIS — I5023 Acute on chronic systolic (congestive) heart failure: Secondary | ICD-10-CM

## 2012-04-18 DIAGNOSIS — I255 Ischemic cardiomyopathy: Secondary | ICD-10-CM

## 2012-04-18 DIAGNOSIS — Z4502 Encounter for adjustment and management of automatic implantable cardiac defibrillator: Secondary | ICD-10-CM

## 2012-04-18 DIAGNOSIS — I2589 Other forms of chronic ischemic heart disease: Secondary | ICD-10-CM

## 2012-04-18 LAB — ICD DEVICE OBSERVATION
AL AMPLITUDE: 3.1 mv
AL IMPEDENCE ICD: 480 Ohm
AL THRESHOLD: 0.75 V
BAMS-0001: 150 {beats}/min
DEV-0020ICD: NEGATIVE
HV IMPEDENCE: 74 Ohm
LV LEAD THRESHOLD: 1.25 V
RV LEAD AMPLITUDE: 12 mv
RV LEAD THRESHOLD: 0.75 V
TZAT-0001SLOWVT: 1
TZAT-0004SLOWVT: 8
TZAT-0018SLOWVT: NEGATIVE
TZON-0010SLOWVT: 40 ms
TZST-0001SLOWVT: 2
TZST-0001SLOWVT: 3
TZST-0003SLOWVT: 36 J

## 2012-04-18 NOTE — Patient Instructions (Addendum)
Remote monitoring is used to monitor your Pacemaker of ICD from home. This monitoring reduces the number of office visits required to check your device to one time per year. It allows [place] to keep an eye on the functioning of your device to ensure it is working properly. You are scheduled for a device check from home on July 18, 2012. You may send your transmission at any time that day. If you have a wireless device, the transmission will be sent automatically. After your physician reviews your transmission, you will receive a postcard with your next transmission date.  Your physician wants you to follow-up in: 1 year with Dr Johney Frame and Dr Jens Som in March  You will receive a reminder letter in the mail two months in advance. If you don't receive a letter, please call our office to schedule the follow-up appointment.   Your physician has requested that you have an echocardiogram. Echocardiography is a painless test that uses sound waves to create images of your heart. It provides your doctor with information about the size and shape of your heart and how well your heart's chambers and valves are working. This procedure takes approximately one hour. There are no restrictions for this procedure.

## 2012-04-18 NOTE — Progress Notes (Signed)
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 male who presents today for routine electrophysiology followup.  Since last being seen in our clinic, the patient reports doing very well.  Today, he denies symptoms of palpitations, chest pain, shortness of breath,  lower extremity edema, dizziness, presyncope, syncope, or ICD shocks.  The patient is otherwise without complaint today.   Past Medical History  Diagnosis Date  . Hypertension   . Hyperlipidemia   . Gout   . CAD (coronary artery disease)     MI in [place] with stenting in 2010, then MI with CABG in [facility] 06/2010.  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  . Ischemic cardiomyopathy     EF 15% by echo 11/2010 and still 20-25% by follow-up echo 02/2011, s/p St. Jude Bi-V ICD implantation 04/01/11  . LBBB (left bundle branch block)   . Renal insufficiency     Cr 1.6 on 03/25/11   Past Surgical History  Procedure Date  . Coronary artery bypass graft 3/12    in [place]  . Tonsillectomy   . Umbilical hernia repair   . Inguinal hernia repair   . Carpel tunnel surgery   . Cardiac defibrillator placement 12/12    BiV ICD (SJM) implanted by Dr Johney Frame    Current Outpatient Prescriptions  Medication Sig Dispense Refill  . allopurinol (ZYLOPRIM) 100 MG tablet Take 100 mg by mouth daily.      Marland Kitchen aspirin 81 MG tablet Take 81 mg by mouth daily.        . carvedilol (COREG) 12.5 MG tablet Take 1 tablet (12.5 mg total) by mouth 2 (two) times daily.  60 tablet  11  . Coenzyme Q10 (COQ-10 PO) Take 1 capsule by mouth 2 (two) times daily.        . furosemide (LASIX) 40 MG tablet Take 40 mg by mouth daily.      Marland Kitchen lisinopril (PRINIVIL,ZESTRIL) 5 MG tablet TAKE 1 TABLET BY MOUTH EVERY DAY  30 tablet  4  . Multiple Vitamins-Minerals (CENTRUM SILVER PO) Take 1 tablet by mouth 2 (two) times daily. Take 1 tablet every evening & at bedtime      . Omega-3 Fatty Acids (FISH OIL) 1000 MG CAPS Take 1,000 mg by mouth at  bedtime.       . potassium chloride SA (K-DUR,KLOR-CON) 20 MEQ tablet Take 20 mEq by mouth daily.      . pravastatin (PRAVACHOL) 80 MG tablet Take 1 tablet (80 mg total) by mouth every evening.  30 tablet  11  . Saw Palmetto, Serenoa repens, 450 MG CAPS Take 450 mg by mouth 2 (two) times daily.          Physical Exam: Filed Vitals:   04/18/12 1133  BP: 116/68  Pulse: 60  Height: 5\' 7"  (1.702 m)  Weight: 170 lb (77.111 kg)  SpO2: 97%    GEN- The patient is well appearing, alert and oriented x 3 today.   Head- normocephalic, atraumatic Eyes-  Sclera clear, conjunctiva pink Ears- hearing intact Oropharynx- clear Lungs- Clear to ausculation bilaterally, normal work of breathing Chest- ICD pocket is well healed Heart- Regular rate and rhythm, no murmurs, rubs or gallops, PMI not laterally displaced GI- soft, NT, ND, + BS Extremities- no clubbing, cyanosis, or edema  ICD interrogation- reviewed in detail today,  See PACEART report  Assessment and Plan:  1.  Chronic systolic dysfunction euvolemic today He reports good clinical response to CRT.  Will repeat an echo at this point to assess EF post CRT. Stable on an appropriate medical regimen Normal ICD function See Arita Miss Art report No changes today  Return in 1 year

## 2012-04-27 ENCOUNTER — Ambulatory Visit (HOSPITAL_COMMUNITY): Payer: Medicare Other | Attending: Cardiovascular Disease

## 2012-04-27 DIAGNOSIS — Z4502 Encounter for adjustment and management of automatic implantable cardiac defibrillator: Secondary | ICD-10-CM

## 2012-04-27 DIAGNOSIS — I255 Ischemic cardiomyopathy: Secondary | ICD-10-CM

## 2012-04-27 DIAGNOSIS — I2589 Other forms of chronic ischemic heart disease: Secondary | ICD-10-CM | POA: Insufficient documentation

## 2012-04-27 DIAGNOSIS — I379 Nonrheumatic pulmonary valve disorder, unspecified: Secondary | ICD-10-CM | POA: Insufficient documentation

## 2012-04-27 DIAGNOSIS — I2510 Atherosclerotic heart disease of native coronary artery without angina pectoris: Secondary | ICD-10-CM | POA: Insufficient documentation

## 2012-04-27 DIAGNOSIS — I10 Essential (primary) hypertension: Secondary | ICD-10-CM | POA: Insufficient documentation

## 2012-04-27 DIAGNOSIS — I080 Rheumatic disorders of both mitral and aortic valves: Secondary | ICD-10-CM | POA: Insufficient documentation

## 2012-04-27 NOTE — Progress Notes (Signed)
Echocardiogram performed.  

## 2012-05-27 ENCOUNTER — Other Ambulatory Visit: Payer: Self-pay | Admitting: Cardiology

## 2012-06-16 ENCOUNTER — Ambulatory Visit: Payer: Medicare Other | Admitting: Cardiology

## 2012-06-26 ENCOUNTER — Other Ambulatory Visit: Payer: Self-pay | Admitting: Cardiology

## 2012-06-28 ENCOUNTER — Other Ambulatory Visit: Payer: Self-pay | Admitting: Cardiology

## 2012-07-13 ENCOUNTER — Ambulatory Visit (INDEPENDENT_AMBULATORY_CARE_PROVIDER_SITE_OTHER): Payer: Medicare Other | Admitting: Cardiology

## 2012-07-13 ENCOUNTER — Encounter: Payer: Self-pay | Admitting: Cardiology

## 2012-07-13 VITALS — BP 120/80 | HR 70 | Wt 169.0 lb

## 2012-07-13 DIAGNOSIS — I255 Ischemic cardiomyopathy: Secondary | ICD-10-CM

## 2012-07-13 DIAGNOSIS — I2589 Other forms of chronic ischemic heart disease: Secondary | ICD-10-CM

## 2012-07-13 DIAGNOSIS — E785 Hyperlipidemia, unspecified: Secondary | ICD-10-CM

## 2012-07-13 DIAGNOSIS — I10 Essential (primary) hypertension: Secondary | ICD-10-CM

## 2012-07-13 DIAGNOSIS — Z4502 Encounter for adjustment and management of automatic implantable cardiac defibrillator: Secondary | ICD-10-CM

## 2012-07-13 DIAGNOSIS — I2510 Atherosclerotic heart disease of native coronary artery without angina pectoris: Secondary | ICD-10-CM

## 2012-07-13 LAB — LIPID PANEL
Cholesterol: 153 mg/dL (ref 0–200)
HDL: 41 mg/dL (ref >39)
Total CHOL/HDL Ratio: 3.7 Ratio
Triglycerides: 102 mg/dL (ref <150)
VLDL: 20 mg/dL (ref 0–40)

## 2012-07-13 LAB — CBC
HCT: 43.7 % (ref 39.0–52.0)
Hemoglobin: 14.9 g/dL (ref 13.0–17.0)
RBC: 5.07 MIL/uL (ref 4.22–5.81)
WBC: 6.7 10*3/uL (ref 4.0–10.5)

## 2012-07-13 LAB — HEPATIC FUNCTION PANEL
ALT: 14 U/L (ref 0–53)
Albumin: 4.3 g/dL (ref 3.5–5.2)
Indirect Bilirubin: 0.6 mg/dL (ref 0.0–0.9)
Total Protein: 6.8 g/dL (ref 6.0–8.3)

## 2012-07-13 NOTE — Progress Notes (Signed)
HPI: Pleasant male with past medical history of coronary artery disease status post coronary artery bypass and graft as well as ischemic cardiomyopathy for FU. Patient's cardiac history dates back to 2010 when he had his first myocardial infarction. He had stents placed in [place]. He had a second myocardial infarction in March of 2012 and then had coronary artery bypass and graft. He was again admitted in August of 2012. He ruled in for a small subendocardial myocardial infarction. Cardiac catheterization in August of 2012 showed EF of 20%. The right coronary and LAD were occluded and there was critical circumflex disease. There was a patent saphenous vein graft to the right coronary artery. Patient had biventricular ICD placed in December of 2012. Last echocardiogram in January of 2014 showed an ejection fraction of 25-30%, mild to moderate left atrial enlargement and mild aortic insufficiency. Since I last saw him, the patient denies any dyspnea on exertion, orthopnea, PND, pedal edema, palpitations, syncope or chest pain. In the past 2 months he has noticed a tickle in his throat. He denies cough.    Current Outpatient Prescriptions  Medication Sig Dispense Refill  . allopurinol (ZYLOPRIM) 100 MG tablet Take 100 mg by mouth daily.      Marland Kitchen aspirin 81 MG tablet Take 81 mg by mouth daily.        . carvedilol (COREG) 12.5 MG tablet TAKE 1 TABLET BY MOUTH TWICE DAILY  60 tablet  0  . Coenzyme Q10 (COQ-10 PO) Take 1 capsule by mouth 2 (two) times daily.        . furosemide (LASIX) 40 MG tablet Take 40 mg by mouth daily.      Marland Kitchen lisinopril (PRINIVIL,ZESTRIL) 5 MG tablet TAKE 1 TABLET BY MOUTH EVERY DAY  30 tablet  4  . Multiple Vitamins-Minerals (CENTRUM SILVER PO) Take 1 tablet by mouth 2 (two) times daily. Take 1 tablet every evening & at bedtime      . Omega-3 Fatty Acids (FISH OIL) 1000 MG CAPS Take 1,000 mg by mouth at bedtime.       . potassium chloride SA (K-DUR,KLOR-CON) 20 MEQ tablet Take 20 mEq  by mouth daily.      . pravastatin (PRAVACHOL) 80 MG tablet TAKE 1 TABLET BY MOUTH EVERY EVENING  30 tablet  0  . Saw Palmetto, Serenoa repens, 450 MG CAPS Take 450 mg by mouth 2 (two) times daily.         No current facility-administered medications for this visit.     Past Medical History  Diagnosis Date  . Hypertension   . Hyperlipidemia   . Gout   . CAD (coronary artery disease)     MI in [place] with stenting in 2010, then MI with CABG in [facility] 06/2010.  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  . Ischemic cardiomyopathy     EF 15% by echo 11/2010 and still 20-25% by follow-up echo 02/2011, s/p St. Jude Bi-V ICD implantation 04/01/11  . LBBB (left bundle branch block)   . Renal insufficiency     Cr 1.6 on 03/25/11    Past Surgical History  Procedure Laterality Date  . Coronary artery bypass graft  3/12    in [place]  . Tonsillectomy    . Umbilical hernia repair    . Inguinal hernia repair    . Carpel tunnel surgery    . Cardiac defibrillator placement  12/12    BiV ICD (SJM) implanted by Dr Johney Frame    History   Social History  .  Marital Status: Married    Spouse Name: N/A    Number of Children: 4  . Years of Education: N/A   Occupational History  .      Retired   Social History Main Topics  . Smoking status: Never Smoker   . Smokeless tobacco: Never Used  . Alcohol Use: Yes     Comment: occasional  . Drug Use: No  . Sexually Active: Yes   Other Topics Concern  . 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 Remaining systems are negative.  Physical Exam: Well-developed well-nourished in no acute distress.  Skin is warm and dry.  HEENT is normal.  Neck is supple.  Chest is clear to auscultation with  normal expansion.  Cardiovascular exam is regular rate and rhythm.  Abdominal exam nontender or distended. No masses palpated. Extremities show no edema. neuro grossly intact  ECG AV paced.

## 2012-07-13 NOTE — Assessment & Plan Note (Signed)
Patient is euvolemic on examination. Continue present dose of Lasix. Check potassium and renal function. 

## 2012-07-13 NOTE — Patient Instructions (Addendum)
Your physician wants you to follow-up in: 6 MONTHS WITH DR CRENSHAW IN [place] You will receive a reminder letter in the mail two months in advance. If you don't receive a letter, please call our office to schedule the follow-up appointment.  

## 2012-07-13 NOTE — Assessment & Plan Note (Signed)
Continue statin.check lipids and liver. 

## 2012-07-13 NOTE — Assessment & Plan Note (Signed)
Management per electrophysiology. 

## 2012-07-13 NOTE — Assessment & Plan Note (Signed)
Blood pressure controlled. Continue present medications. 

## 2012-07-13 NOTE — Assessment & Plan Note (Addendum)
Continue aspirin and statin. He has mild ecchymosis on his left upper extremity. Check hemoglobin and platelet count.

## 2012-07-13 NOTE — Assessment & Plan Note (Signed)
Continue ACE inhibitor and beta blocker. He has a tickle in his throat. If he does not improve we will consider changing to an ARB to see if his ACE inhibitor is contributing.

## 2012-07-14 ENCOUNTER — Encounter: Payer: Self-pay | Admitting: *Deleted

## 2012-07-14 LAB — BASIC METABOLIC PANEL WITH GFR
BUN: 22 mg/dL (ref 6–23)
GFR, Est African American: 62 mL/min
GFR, Est Non African American: 54 mL/min — ABNORMAL LOW
Potassium: 4.7 mEq/L (ref 3.5–5.3)
Sodium: 142 mEq/L (ref 135–145)

## 2012-07-18 ENCOUNTER — Encounter: Payer: Medicare Other | Admitting: *Deleted

## 2012-07-20 ENCOUNTER — Encounter: Payer: Self-pay | Admitting: *Deleted

## 2012-07-25 ENCOUNTER — Other Ambulatory Visit: Payer: Self-pay | Admitting: Cardiology

## 2012-07-28 ENCOUNTER — Ambulatory Visit (INDEPENDENT_AMBULATORY_CARE_PROVIDER_SITE_OTHER): Payer: Medicare Other | Admitting: *Deleted

## 2012-07-28 ENCOUNTER — Encounter: Payer: Self-pay | Admitting: Internal Medicine

## 2012-07-28 ENCOUNTER — Other Ambulatory Visit: Payer: Self-pay | Admitting: Internal Medicine

## 2012-07-28 DIAGNOSIS — Z4502 Encounter for adjustment and management of automatic implantable cardiac defibrillator: Secondary | ICD-10-CM

## 2012-07-28 DIAGNOSIS — I519 Heart disease, unspecified: Secondary | ICD-10-CM

## 2012-07-28 DIAGNOSIS — I255 Ischemic cardiomyopathy: Secondary | ICD-10-CM

## 2012-07-28 DIAGNOSIS — I2589 Other forms of chronic ischemic heart disease: Secondary | ICD-10-CM

## 2012-07-29 LAB — REMOTE ICD DEVICE
BAMS-0001: 150 {beats}/min
LV LEAD IMPEDENCE ICD: 410 Ohm
LV LEAD THRESHOLD: 1.875 V
RV LEAD IMPEDENCE ICD: 380 Ohm
TZAT-0004SLOWVT: 8
TZAT-0013SLOWVT: 2
TZAT-0018SLOWVT: NEGATIVE
TZON-0003SLOWVT: 320 ms
TZST-0001SLOWVT: 3
TZST-0003SLOWVT: 40 J
VENTRICULAR PACING ICD: 100 pct

## 2012-08-11 ENCOUNTER — Other Ambulatory Visit: Payer: Self-pay | Admitting: Cardiology

## 2012-08-25 ENCOUNTER — Other Ambulatory Visit: Payer: Self-pay | Admitting: Cardiology

## 2012-08-26 ENCOUNTER — Encounter: Payer: Self-pay | Admitting: *Deleted

## 2012-09-25 ENCOUNTER — Other Ambulatory Visit: Payer: Self-pay | Admitting: Cardiology

## 2012-10-09 ENCOUNTER — Other Ambulatory Visit: Payer: Self-pay | Admitting: Cardiology

## 2012-10-31 ENCOUNTER — Encounter: Payer: Medicare Other | Admitting: *Deleted

## 2012-11-01 ENCOUNTER — Encounter: Payer: Self-pay | Admitting: *Deleted

## 2012-11-06 ENCOUNTER — Other Ambulatory Visit: Payer: Self-pay | Admitting: Cardiology

## 2012-12-07 ENCOUNTER — Other Ambulatory Visit: Payer: Self-pay | Admitting: Cardiology

## 2012-12-08 ENCOUNTER — Telehealth: Payer: Self-pay | Admitting: Cardiology

## 2012-12-08 NOTE — Telephone Encounter (Signed)
Line was busy. Will try and return call/kwm

## 2012-12-08 NOTE — Telephone Encounter (Signed)
New Prob     Pt states he has some muscular pain in his shoulder and has a pacemaker. Pt wants to know if it is safe for him to use an electrical heating pad to relieve his pian. Please call.

## 2012-12-08 NOTE — Telephone Encounter (Signed)
Follow up ° ° °Pt returning your call °

## 2012-12-09 NOTE — Telephone Encounter (Signed)
Pt returned call. All questions were answered.

## 2012-12-09 NOTE — Telephone Encounter (Signed)
LMOM for return call//kwm  

## 2013-01-07 ENCOUNTER — Other Ambulatory Visit: Payer: Self-pay | Admitting: Cardiology

## 2013-01-09 ENCOUNTER — Other Ambulatory Visit: Payer: Self-pay | Admitting: Cardiology

## 2013-02-04 ENCOUNTER — Other Ambulatory Visit: Payer: Self-pay | Admitting: Cardiology

## 2013-05-07 ENCOUNTER — Other Ambulatory Visit: Payer: Self-pay | Admitting: Cardiology

## 2013-06-02 ENCOUNTER — Encounter: Payer: Self-pay | Admitting: Internal Medicine

## 2013-06-02 ENCOUNTER — Ambulatory Visit (INDEPENDENT_AMBULATORY_CARE_PROVIDER_SITE_OTHER): Payer: Medicare Other | Admitting: Internal Medicine

## 2013-06-02 VITALS — BP 122/78 | HR 60 | Ht 67.0 in | Wt 158.0 lb

## 2013-06-02 DIAGNOSIS — I519 Heart disease, unspecified: Secondary | ICD-10-CM

## 2013-06-02 DIAGNOSIS — Z4502 Encounter for adjustment and management of automatic implantable cardiac defibrillator: Secondary | ICD-10-CM

## 2013-06-02 DIAGNOSIS — I255 Ischemic cardiomyopathy: Secondary | ICD-10-CM

## 2013-06-02 DIAGNOSIS — I2589 Other forms of chronic ischemic heart disease: Secondary | ICD-10-CM

## 2013-06-02 LAB — MDC_IDC_ENUM_SESS_TYPE_INCLINIC
Battery Remaining Longevity: 55.2 mo
Date Time Interrogation Session: 20150220175857
HighPow Impedance: 76.5 Ohm
Implantable Pulse Generator Model: 3265
Implantable Pulse Generator Serial Number: 7016547
Lead Channel Impedance Value: 462.5 Ohm
Lead Channel Impedance Value: 850 Ohm
Lead Channel Pacing Threshold Amplitude: 0.75 V
Lead Channel Pacing Threshold Amplitude: 0.875 V
Lead Channel Pacing Threshold Amplitude: 1.25 V
Lead Channel Pacing Threshold Pulse Width: 0.5 ms
Lead Channel Pacing Threshold Pulse Width: 0.5 ms
Lead Channel Pacing Threshold Pulse Width: 0.8 ms
Lead Channel Sensing Intrinsic Amplitude: 2.2 mV
Lead Channel Setting Pacing Amplitude: 2 V
Lead Channel Setting Pacing Amplitude: 2.5 V
Lead Channel Setting Pacing Pulse Width: 0.5 ms
Lead Channel Setting Sensing Sensitivity: 0.5 mV
MDC IDC MSMT LEADCHNL LV PACING THRESHOLD AMPLITUDE: 1.25 V
MDC IDC MSMT LEADCHNL LV PACING THRESHOLD PULSEWIDTH: 0.8 ms
MDC IDC MSMT LEADCHNL RA PACING THRESHOLD AMPLITUDE: 0.75 V
MDC IDC MSMT LEADCHNL RA PACING THRESHOLD PULSEWIDTH: 0.5 ms
MDC IDC MSMT LEADCHNL RV IMPEDANCE VALUE: 400 Ohm
MDC IDC MSMT LEADCHNL RV SENSING INTR AMPL: 12 mV
MDC IDC SET LEADCHNL LV PACING PULSEWIDTH: 0.8 ms
MDC IDC SET LEADCHNL RV PACING AMPLITUDE: 2 V
MDC IDC SET ZONE DETECTION INTERVAL: 270 ms
MDC IDC STAT BRADY RA PERCENT PACED: 40 %
MDC IDC STAT BRADY RV PERCENT PACED: 99.74 %
Zone Setting Detection Interval: 320 ms

## 2013-06-02 NOTE — Patient Instructions (Signed)
Your physician wants you to follow-up in: 12 months with Dr Jacquiline DoeAllred You will receive a reminder letter in the mail two months in advance. If you don't receive a letter, please call our office to schedule the follow-up appointment.   Remote monitoring is used to monitor your Pacemaker or ICD from home. This monitoring reduces the number of office visits required to check your device to one time per year. It allows us to keep an eye on the functioning of your device to ensure it is working properly. You are scheduled for a device check from home on 09/05/13. You may send your transmission at any time that day. If you have a wireless device, the transmission will be sent automatically. After your physician reviews your transmission, you will receive a postcard with your next transmission date.  IC[facility] clinic with Sherri RadHeather McGhee, RN  07/03/13  She will call you

## 2013-06-04 NOTE — Progress Notes (Signed)
  PCP: Doreatha MartinVELAZQUEZ,GRETCHEN, MD Primary Cardiologist:  Dr Arlyce Harmanrenshaw  Dave Daniels is a 73 y.o. male who presents today for routine electrophysiology followup.  Since last being seen in our clinic, the patient reports doing very well.  Today, he denies symptoms of palpitations, chest pain, shortness of breath,  lower extremity edema, dizziness, presyncope, syncope, or ICD shocks.  The patient is otherwise without complaint today.   Past Medical History  Diagnosis Date  . Hypertension   . Hyperlipidemia   . Gout   . CAD (coronary artery disease)     [place], [place] [place].  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  . Ischemic cardiomyopathy     EF 15% by echo 11/2010 and still 20-25% by follow-up echo 02/2011, s/p [employer] Bi-V ICD implantation 04/01/11  . LBBB (left bundle branch block)   . Renal insufficiency     Cr 1.6 on 03/25/11   Past Surgical History  Procedure Laterality Date  . Coronary artery bypass graft  3/12    [place]  . Tonsillectomy    . Umbilical hernia repair    . Inguinal hernia repair    . Carpel tunnel surgery    . Cardiac defibrillator placement  12/12    BiV ICD (SJM) implanted by Dr Johney FrameAllred    Current Outpatient Prescriptions  Medication Sig Dispense Refill  . allopurinol (ZYLOPRIM) 100 MG tablet Take 100 mg by mouth daily.      Marland Kitchen. atorvastatin (LIPITOR) 40 MG tablet Take 1 tablet by mouth daily.      . carvedilol (COREG) 12.5 MG tablet TAKE 1 TABLET BY MOUTH TWICE DAILY  60 tablet  0  . CRANBERRY PO Take 1 capsule by mouth daily.      . furosemide (LASIX) 40 MG tablet Take 40 mg by mouth daily.      Marland Kitchen. lisinopril (PRINIVIL,ZESTRIL) 5 MG tablet TAKE 1 TABLET BY MOUTH EVERY DAY  30 tablet  6  . Multiple Vitamins-Minerals (CENTRUM SILVER PO) Take 1 tablet by mouth daily.       . potassium chloride SA (K-DUR,KLOR-CON) 20 MEQ tablet Take 20 mEq by mouth daily.      . Saw Palmetto, Serenoa repens, 450 MG CAPS Take 450 mg by  mouth 2 (two) times daily.         No current facility-administered medications for this visit.    Physical Exam: Filed Vitals:   06/02/13 1628  BP: 122/78  Pulse: 60  Height: 5\' 7"  (1.702 m)  Weight: 158 lb (71.668 kg)    GEN- The patient is well appearing, alert and oriented x 3 today.   Head- normocephalic, atraumatic Eyes-  Sclera clear, conjunctiva pink Ears- hearing intact Oropharynx- clear Lungs- Clear to ausculation bilaterally, normal work of breathing Chest- ICD pocket is well healed Heart- Regular rate and rhythm, no murmurs, rubs or gallops, PMI not laterally displaced GI- soft, NT, ND, + BS Extremities- no clubbing, cyanosis, or edema  ICD interrogation- reviewed in detail today,  See PACEART report ekg today reveals AV sequential pacing  Assessment and Plan:  1.  Chronic systolic dysfunction euvolemic today He reports good clinical response to CRT but EF remains depressed.   Normal ICD function See Arita Missace Art report As per quickopt today, I have adjusted paced/sensed AV.  No VV changes were made  [facility]  Return in 1 year

## 2013-06-12 ENCOUNTER — Other Ambulatory Visit: Payer: Self-pay | Admitting: Cardiology

## 2013-07-03 ENCOUNTER — Encounter: Payer: Medicare Other | Admitting: *Deleted

## 2013-07-18 ENCOUNTER — Encounter: Payer: Self-pay | Admitting: *Deleted

## 2013-07-24 ENCOUNTER — Encounter: Payer: Self-pay | Admitting: Internal Medicine

## 2013-07-25 ENCOUNTER — Encounter: Payer: Self-pay | Admitting: *Deleted

## 2013-07-31 ENCOUNTER — Other Ambulatory Visit: Payer: Self-pay | Admitting: Cardiology

## 2013-08-07 ENCOUNTER — Other Ambulatory Visit: Payer: Self-pay | Admitting: Cardiology

## 2013-10-05 ENCOUNTER — Ambulatory Visit (INDEPENDENT_AMBULATORY_CARE_PROVIDER_SITE_OTHER): Payer: Medicare Other | Admitting: *Deleted

## 2013-10-05 ENCOUNTER — Encounter: Payer: Self-pay | Admitting: Internal Medicine

## 2013-10-05 ENCOUNTER — Telehealth: Payer: Self-pay | Admitting: Cardiology

## 2013-10-05 DIAGNOSIS — I255 Ischemic cardiomyopathy: Secondary | ICD-10-CM

## 2013-10-05 DIAGNOSIS — I519 Heart disease, unspecified: Secondary | ICD-10-CM

## 2013-10-05 DIAGNOSIS — I2589 Other forms of chronic ischemic heart disease: Secondary | ICD-10-CM

## 2013-10-05 NOTE — Telephone Encounter (Signed)
Spoke with pt and reminded pt of remote transmission that is due today. Pt verbalized understanding.   

## 2013-10-05 NOTE — Progress Notes (Signed)
Remote ICD transmission.   

## 2013-10-09 LAB — MDC_IDC_ENUM_SESS_TYPE_REMOTE
Battery Remaining Percentage: 62 %
Battery Voltage: 2.93 V
Brady Statistic AP VP Percent: 28 %
Brady Statistic AP VS Percent: 1 %
Brady Statistic AS VP Percent: 71 %
Date Time Interrogation Session: 20150625161656
HIGH POWER IMPEDANCE MEASURED VALUE: 66 Ohm
HighPow Impedance: 66 Ohm
Implantable Pulse Generator Model: 3265
Implantable Pulse Generator Serial Number: 7016547
Lead Channel Impedance Value: 350 Ohm
Lead Channel Impedance Value: 410 Ohm
Lead Channel Pacing Threshold Pulse Width: 0.5 ms
Lead Channel Pacing Threshold Pulse Width: 0.8 ms
Lead Channel Setting Pacing Amplitude: 2 V
Lead Channel Setting Pacing Amplitude: 2 V
Lead Channel Setting Pacing Pulse Width: 0.5 ms
Lead Channel Setting Pacing Pulse Width: 0.8 ms
Lead Channel Setting Sensing Sensitivity: 0.5 mV
MDC IDC MSMT BATTERY REMAINING LONGEVITY: 52 mo
MDC IDC MSMT LEADCHNL LV IMPEDANCE VALUE: 780 Ohm
MDC IDC MSMT LEADCHNL LV PACING THRESHOLD AMPLITUDE: 1.25 V
MDC IDC MSMT LEADCHNL RA SENSING INTR AMPL: 2.6 mV
MDC IDC MSMT LEADCHNL RV PACING THRESHOLD AMPLITUDE: 0.625 V
MDC IDC MSMT LEADCHNL RV SENSING INTR AMPL: 12 mV
MDC IDC SET LEADCHNL LV PACING AMPLITUDE: 2.5 V
MDC IDC STAT BRADY AS VS PERCENT: 1 %
MDC IDC STAT BRADY RA PERCENT PACED: 27 %
Zone Setting Detection Interval: 270 ms
Zone Setting Detection Interval: 320 ms

## 2013-10-11 ENCOUNTER — Encounter: Payer: Self-pay | Admitting: Cardiology

## 2013-10-13 ENCOUNTER — Other Ambulatory Visit: Payer: Self-pay | Admitting: Cardiology

## 2013-10-16 ENCOUNTER — Telehealth: Payer: Self-pay | Admitting: Internal Medicine

## 2013-10-16 NOTE — Telephone Encounter (Signed)
Transmission received, patient aware. 

## 2013-10-16 NOTE — Telephone Encounter (Signed)
New message     Did we get his remote transmission download?

## 2013-10-24 DIAGNOSIS — M109 Gout, unspecified: Secondary | ICD-10-CM | POA: Insufficient documentation

## 2013-10-24 DIAGNOSIS — N183 Chronic kidney disease, stage 3 unspecified: Secondary | ICD-10-CM | POA: Insufficient documentation

## 2013-10-30 ENCOUNTER — Encounter: Payer: Self-pay | Admitting: *Deleted

## 2013-11-16 ENCOUNTER — Ambulatory Visit (INDEPENDENT_AMBULATORY_CARE_PROVIDER_SITE_OTHER): Payer: Medicare Other | Admitting: *Deleted

## 2013-11-16 ENCOUNTER — Encounter: Payer: Self-pay | Admitting: *Deleted

## 2013-11-16 ENCOUNTER — Telehealth: Payer: Self-pay | Admitting: *Deleted

## 2013-11-16 DIAGNOSIS — I519 Heart disease, unspecified: Secondary | ICD-10-CM

## 2013-11-16 DIAGNOSIS — Z95810 Presence of automatic (implantable) cardiac defibrillator: Secondary | ICD-10-CM

## 2013-11-16 NOTE — Telephone Encounter (Signed)
The patient called today. He is having trouble sending his transmission through. He states the light was stuck on the tower picture and the lights flashing across the bottom. I had him hit the reset button and after that only the lights on the bottom of the transmitter were flashing. I have asked him to call technical support with [employer] and he will do this. He states he has the number to call. I will check in with him later today.

## 2013-11-16 NOTE — Progress Notes (Signed)
EPIC Encounter for ICM Monitoring  Patient Name: Dave Daniels is a 73 y.o. male Date: 11/16/2013 Primary Care Physican: Doreatha MartinVELAZQUEZ,GRETCHEN, MD Primary Cardiologist: Jens Somrenshaw Electrophysiologist: [name] Dry Weight: 168 lbs  Bi-V pacing: 99%      In the past month, have you:  1. Gained more than 2 pounds in a day or more than 5 pounds in a week? No. He states his weight will flucuate from 168-170 lbs.  2. Had changes in your medications (with verification of current medications)? no  3. Had more shortness of breath than is usual for you? no  4. Limited your activity because of shortness of breath? no  5. Not been able to sleep because of shortness of breath? no  6. Had increased swelling in your feet or ankles? no  7. Had symptoms of dehydration (dizziness, dry mouth, increased thirst, decreased urine output) no  8. Had changes in sodium restriction? No. He does not add salt his food or eat canned food items.  9. Been compliant with medication? Yes   ICM trend:   Follow-up plan: ICM clinic phone appointment: 12/21/13. This is my first ICM encounter with the patient. Corvue trends are elevated over the last 9 days. The patient states he has had no changes in medications or diet. He states he usually drinks water, but no change there either. He is on lasix 40 mg once daily and reports that he urinates quite a bit on this dose. No changes made today. I advised I would forward to Dr. Jens Somrenshaw and Dr. Johney FrameAllred for review. His most recent labs are from April 2014 in EPIC, but he states he has had labs done with his PCP. I will contact Dr. Frederik PearVelazquez's office to see if we can obtain most recent labs.  Copy of note sent to patient's primary care physician, primary cardiologist, and device following physician.  Sherri RadHeather Deem Marmol, RN, BSN 11/16/2013 3:20 PM

## 2013-11-16 NOTE — Telephone Encounter (Signed)
Transmission received. ICM encounter completed.

## 2013-11-20 ENCOUNTER — Other Ambulatory Visit: Payer: Self-pay | Admitting: Cardiology

## 2013-11-23 MED ORDER — FUROSEMIDE 40 MG PO TABS
ORAL_TABLET | ORAL | Status: DC
Start: 2013-11-23 — End: 2014-10-24

## 2013-11-23 NOTE — Addendum Note (Signed)
Addended by: Freddi StarrMATHIS, Seon Gaertner W on: 11/23/2013 02:42 PM   Modules accepted: Orders

## 2013-11-23 NOTE — Progress Notes (Signed)
Spoke with Dave Daniels, aware per dr Jens Somcrenshaw to increase his furosemide to 60 mg once daily and repeat bmp in one week. Patient voiced understanding

## 2013-12-01 ENCOUNTER — Other Ambulatory Visit: Payer: Medicare Other

## 2013-12-01 DIAGNOSIS — Z95810 Presence of automatic (implantable) cardiac defibrillator: Secondary | ICD-10-CM

## 2013-12-01 DIAGNOSIS — I519 Heart disease, unspecified: Secondary | ICD-10-CM

## 2013-12-02 LAB — BASIC METABOLIC PANEL
BUN: 20 mg/dL (ref 6–23)
CHLORIDE: 104 meq/L (ref 96–112)
CO2: 29 mEq/L (ref 19–32)
Calcium: 9.6 mg/dL (ref 8.4–10.5)
Creatinine, Ser: 1.4 mg/dL (ref 0.4–1.5)
GFR: 51.94 mL/min — AB (ref >60.00)
Glucose, Bld: 55 mg/dL — ABNORMAL LOW (ref 70–99)
POTASSIUM: 4.4 meq/L (ref 3.5–5.1)
Sodium: 144 mEq/L (ref 135–145)

## 2013-12-03 IMAGING — CR DG CHEST 2V
2 series · 2 of 2 positions shown · non-contrast
Comparison: None.

CLINICAL DATA: Pacemaker placement.

CHEST - 2 VIEW

[w chest pa]
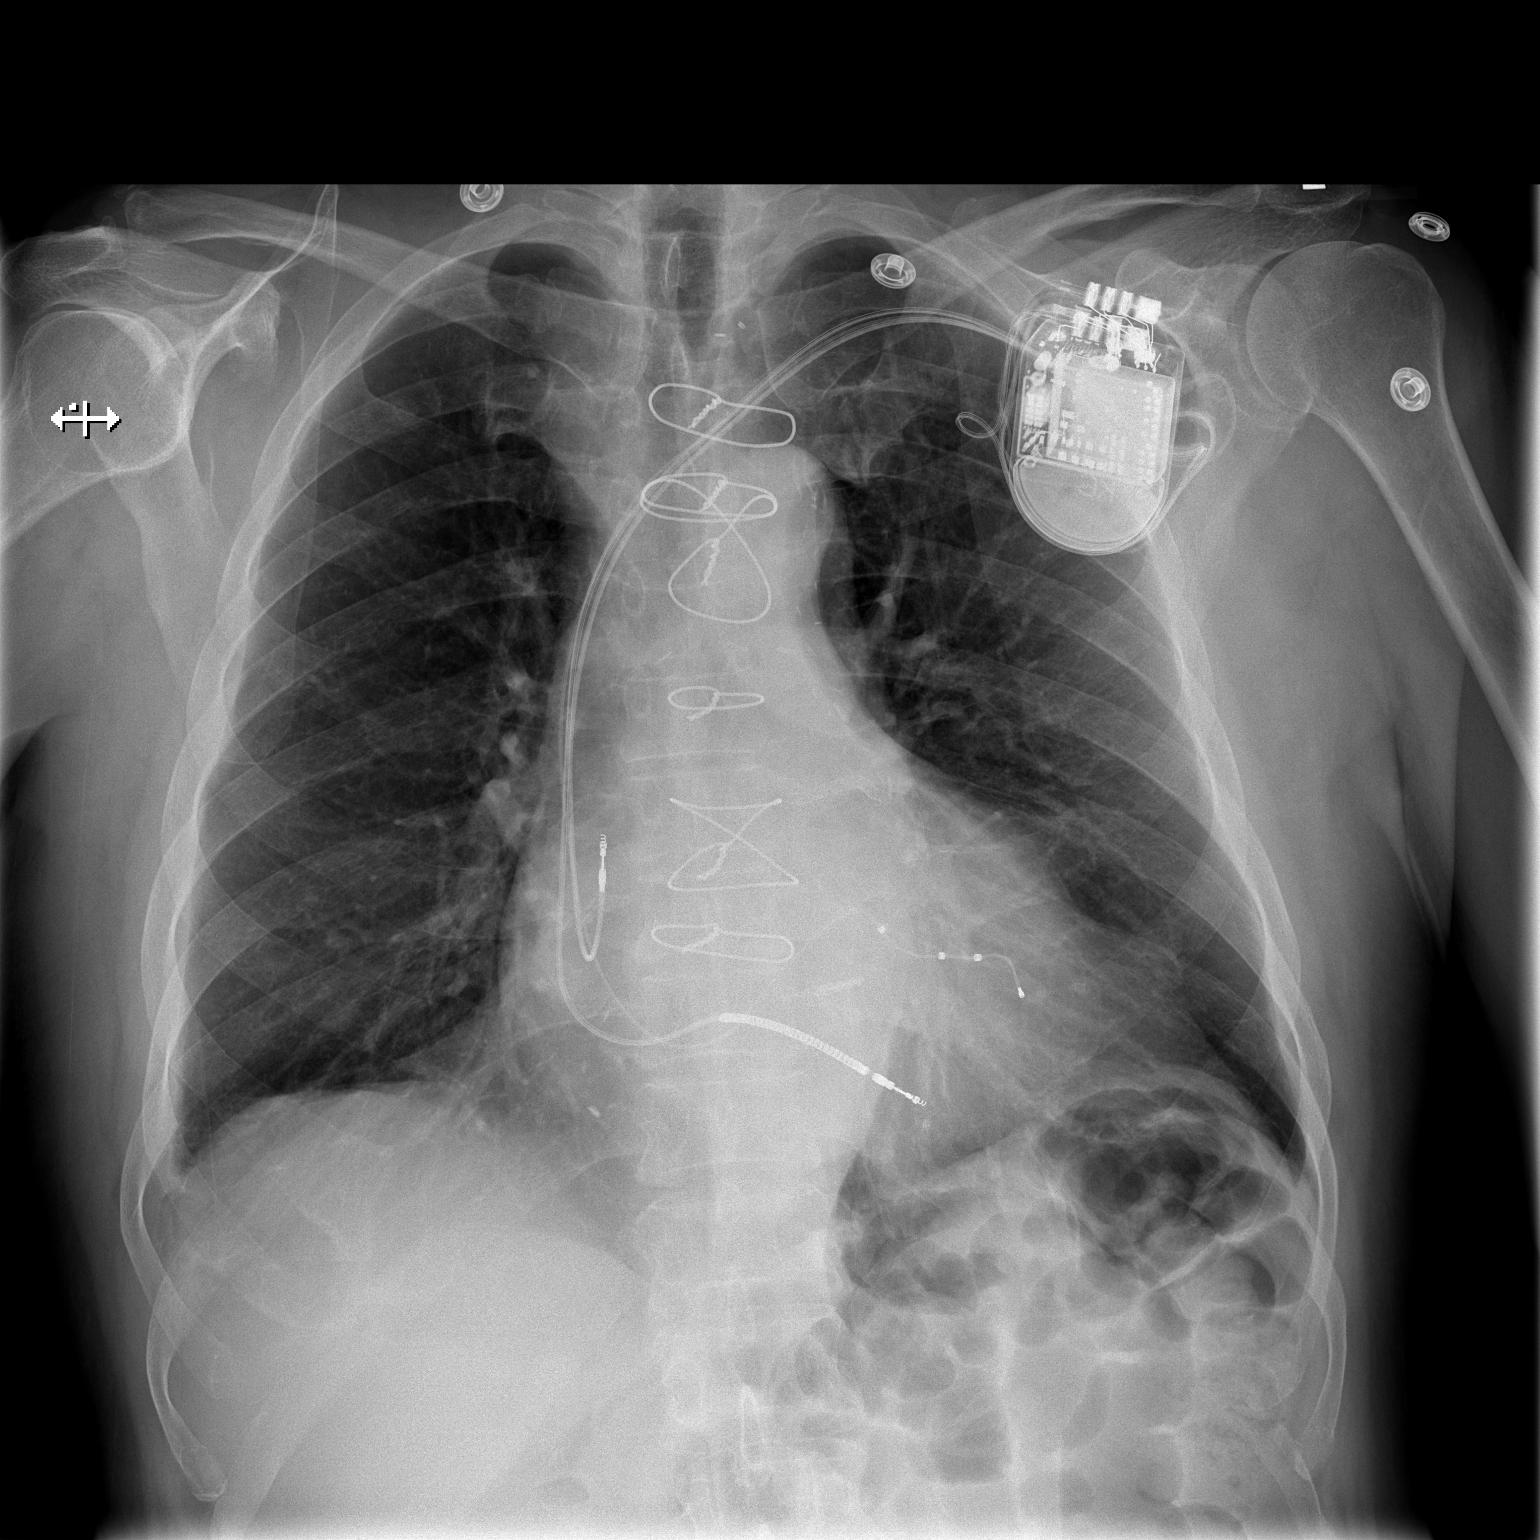

[w chest lat]
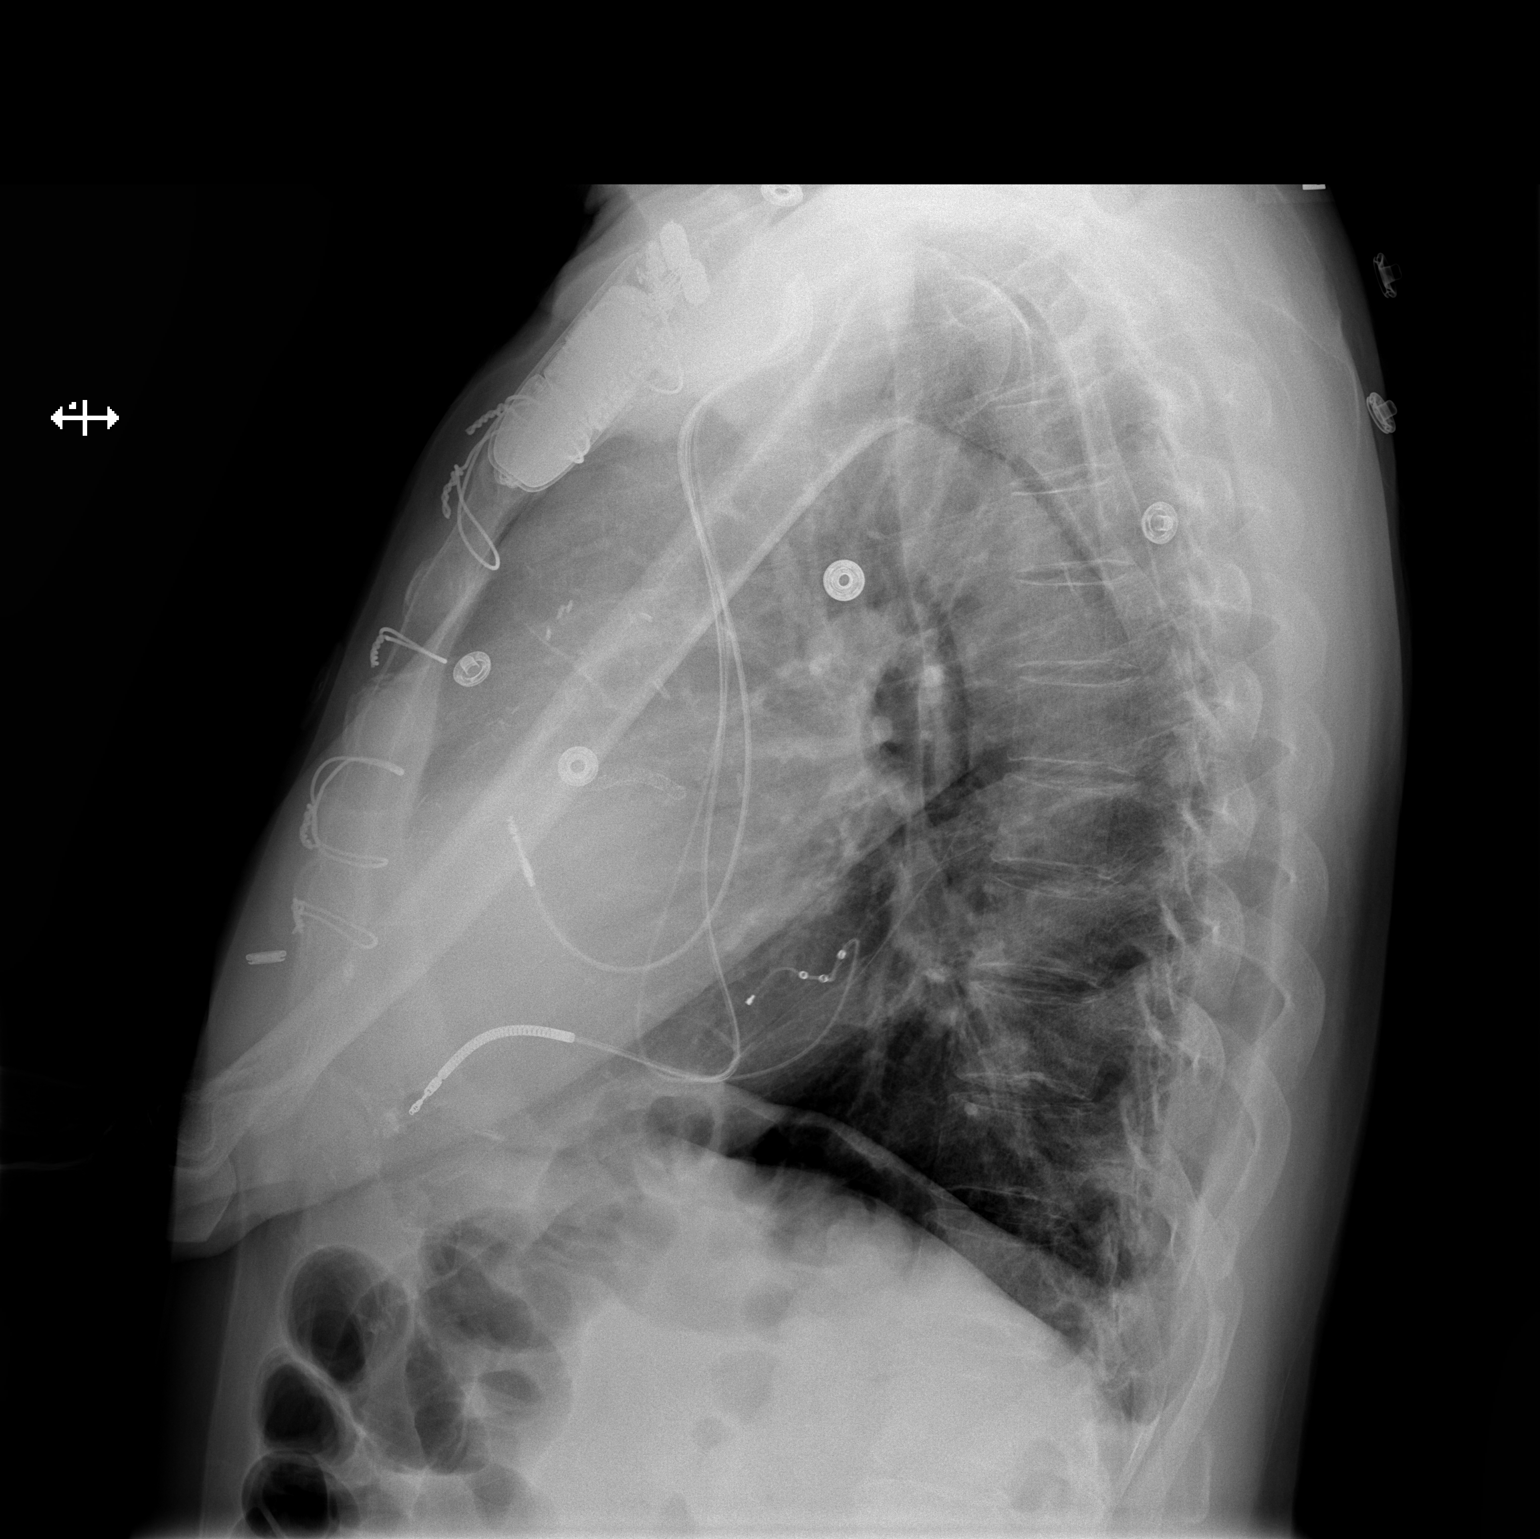

[2 of 2 positions shown; findings below may reference images not displayed]

FINDINGS: Cardiomegaly.  Median sternotomy.  Left subclavian three
lead pacemaker apparatus with coronary sinus lead.  Leads appear
well positioned.  No pneumothorax is identified.  Blunting of the
costophrenic angles on the lateral view may be due to a small
amount of pleural fluid or chronic.  Atelectasis projects over the
lower thoracic spine on the lateral view.
IMPRESSION: Uncomplicated left subclavian three lead cardiac pacemaker
placement.  Cardiomegaly without failure.

## 2013-12-04 DIAGNOSIS — Z95810 Presence of automatic (implantable) cardiac defibrillator: Secondary | ICD-10-CM | POA: Insufficient documentation

## 2013-12-21 ENCOUNTER — Encounter: Payer: Self-pay | Admitting: *Deleted

## 2013-12-21 ENCOUNTER — Ambulatory Visit (INDEPENDENT_AMBULATORY_CARE_PROVIDER_SITE_OTHER): Payer: Medicare Other | Admitting: *Deleted

## 2013-12-21 DIAGNOSIS — I519 Heart disease, unspecified: Secondary | ICD-10-CM

## 2013-12-21 DIAGNOSIS — Z95810 Presence of automatic (implantable) cardiac defibrillator: Secondary | ICD-10-CM

## 2013-12-21 NOTE — Progress Notes (Signed)
EPIC Encounter for ICM Monitoring  Patient Name: Dave Daniels is a 73 y.o. male Date: 12/21/2013 Primary Care Physican: Doreatha Martin, MD Primary Cardiologist: Jens Som Electrophysiologist: [name] Dry Weight: 168 lbs       In the past month, have you:  1. Gained more than 2 pounds in a day or more than 5 pounds in a week? no  2. Had changes in your medications (with verification of current medications)? Yes. I spoke with the patient for the first time last month. At the time, corvue trends were up for the patient, but he was asymptomatic. Dr. Jens Som reviewed and increased the patient's lasix from 40 mg daily to 60 mg daily. Confirmed he is still taking 60 mg once daily on his lasix. A repeat BMP was done on 8/21- BUN/ creatinine- 20/1.4.  3. Had more shortness of breath than is usual for you? no  4. Limited your activity because of shortness of breath? no  5. Not been able to sleep because of shortness of breath? no  6. Had increased swelling in your feet or ankles? no  7. Had symptoms of dehydration (dizziness, dry mouth, increased thirst, decreased urine output) no  8. Had changes in sodium restriction? no  9. Been compliant with medication? Yes  ** Only complaint this month is a gout flare up. He is on medication for this. **   ICM trend:   Follow-up plan: ICM clinic phone appointment: 01/22/14  Copy of note sent to patient's primary care physician, primary cardiologist, and device following physician.  Sherri Rad, RN, BSN 12/21/2013 12:40 PM

## 2013-12-25 ENCOUNTER — Other Ambulatory Visit: Payer: Self-pay | Admitting: Cardiology

## 2014-01-22 ENCOUNTER — Encounter: Payer: Self-pay | Admitting: Internal Medicine

## 2014-01-22 ENCOUNTER — Encounter: Payer: Self-pay | Admitting: Cardiovascular Disease

## 2014-01-22 ENCOUNTER — Encounter: Payer: Self-pay | Admitting: *Deleted

## 2014-01-22 ENCOUNTER — Ambulatory Visit (INDEPENDENT_AMBULATORY_CARE_PROVIDER_SITE_OTHER): Payer: Medicare Other | Admitting: *Deleted

## 2014-01-22 DIAGNOSIS — Z95810 Presence of automatic (implantable) cardiac defibrillator: Secondary | ICD-10-CM

## 2014-01-22 DIAGNOSIS — I255 Ischemic cardiomyopathy: Secondary | ICD-10-CM

## 2014-01-22 DIAGNOSIS — I519 Heart disease, unspecified: Secondary | ICD-10-CM

## 2014-01-22 LAB — MDC_IDC_ENUM_SESS_TYPE_REMOTE
Battery Remaining Longevity: 48 mo
Battery Remaining Percentage: 59 %
Battery Voltage: 2.93 V
Brady Statistic AP VP Percent: 37 %
Brady Statistic AS VP Percent: 62 %
Brady Statistic RA Percent Paced: 36 %
Date Time Interrogation Session: 20151012151239
HIGH POWER IMPEDANCE MEASURED VALUE: 68 Ohm
HIGH POWER IMPEDANCE MEASURED VALUE: 68 Ohm
Implantable Pulse Generator Serial Number: 7016547
Lead Channel Impedance Value: 380 Ohm
Lead Channel Impedance Value: 800 Ohm
Lead Channel Pacing Threshold Amplitude: 0.75 V
Lead Channel Pacing Threshold Amplitude: 1.25 V
Lead Channel Pacing Threshold Pulse Width: 0.5 ms
Lead Channel Setting Pacing Amplitude: 2.5 V
Lead Channel Setting Pacing Pulse Width: 0.8 ms
Lead Channel Setting Sensing Sensitivity: 0.5 mV
MDC IDC MSMT LEADCHNL LV PACING THRESHOLD PULSEWIDTH: 0.8 ms
MDC IDC MSMT LEADCHNL RA IMPEDANCE VALUE: 440 Ohm
MDC IDC MSMT LEADCHNL RA PACING THRESHOLD AMPLITUDE: 0.75 V
MDC IDC MSMT LEADCHNL RA PACING THRESHOLD PULSEWIDTH: 0.5 ms
MDC IDC MSMT LEADCHNL RA SENSING INTR AMPL: 5 mV
MDC IDC MSMT LEADCHNL RV SENSING INTR AMPL: 12 mV
MDC IDC PG MODEL: 3265
MDC IDC SET LEADCHNL RA PACING AMPLITUDE: 2 V
MDC IDC SET LEADCHNL RV PACING AMPLITUDE: 2 V
MDC IDC SET LEADCHNL RV PACING PULSEWIDTH: 0.5 ms
MDC IDC STAT BRADY AP VS PERCENT: 1 %
MDC IDC STAT BRADY AS VS PERCENT: 1 %
Zone Setting Detection Interval: 270 ms
Zone Setting Detection Interval: 320 ms

## 2014-01-22 NOTE — Progress Notes (Signed)
Remote ICD transmission.   

## 2014-01-22 NOTE — Progress Notes (Signed)
EPIC Encounter for ICM Monitoring  Patient Name: Dave Daniels is a 73 y.o. male Date: 01/22/2014 Primary Care Physican: Doreatha MartinVELAZQUEZ,GRETCHEN, MD Primary Cardiologist: Jens Somrenshaw Electrophysiologist: [name] Dry Weight: 168 lbs  Bi-V pacing: 99%       In the past month, have you:  1. Gained more than 2 pounds in a day or more than 5 pounds in a week? No. Weight usually runs from 168-170 lbs.  2. Had changes in your medications (with verification of current medications)? no  3. Had more shortness of breath than is usual for you? no  4. Limited your activity because of shortness of breath? no  5. Not been able to sleep because of shortness of breath? no  6. Had increased swelling in your feet or ankles? no  7. Had symptoms of dehydration (dizziness, dry mouth, increased thirst, decreased urine output) no  8. Had changes in sodium restriction? no  9. Been compliant with medication? Yes   ICM trend:   Follow-up plan: ICM clinic phone appointment: 02/22/14. The patient's corvue trends have been well this month. His fluids do appear to be up, per the device, since 01/20/14. He states that he went [place] [employer] [place] on Saturday and did have more beer than he typically would. I have asked that he take an additional 20 mg of lasix for the next 2 days. He is agreeable.  Copy of note sent to patient's primary care physician, primary cardiologist, and device following physician.  Sherri RadHeather McGhee, RN, BSN 01/22/2014 12:06 PM

## 2014-02-08 ENCOUNTER — Encounter: Payer: Self-pay | Admitting: Cardiology

## 2014-02-22 ENCOUNTER — Ambulatory Visit (INDEPENDENT_AMBULATORY_CARE_PROVIDER_SITE_OTHER): Payer: Medicare Other | Admitting: *Deleted

## 2014-02-22 DIAGNOSIS — I519 Heart disease, unspecified: Secondary | ICD-10-CM

## 2014-02-22 DIAGNOSIS — Z95810 Presence of automatic (implantable) cardiac defibrillator: Secondary | ICD-10-CM

## 2014-02-23 ENCOUNTER — Encounter: Payer: Self-pay | Admitting: *Deleted

## 2014-02-23 NOTE — Progress Notes (Signed)
EPIC Encounter for ICM Monitoring  Patient Name: Boston ServiceFabio A Groninger is a 73 y.o. male Date: 02/23/2014 Primary Care Physican: Doreatha MartinVELAZQUEZ,GRETCHEN, MD Primary Cardiologist: Jens Somrenshaw Electrophysiologist: [name] Dry Weight: 170 lbs       In the past month, have you:  1. Gained more than 2 pounds in a day or more than 5 pounds in a week? Occasionally his weight will go up to 172 lbs.  2. Had changes in your medications (with verification of current medications)? no  3. Had more shortness of breath than is usual for you? no  4. Limited your activity because of shortness of breath? no  5. Not been able to sleep because of shortness of breath? no  6. Had increased swelling in your feet or ankles? no  7. Had symptoms of dehydration (dizziness, dry mouth, increased thirst, decreased urine output) no  8. Had changes in sodium restriction? no  9. Been compliant with medication? Yes   ICM trend:   Follow-up plan: ICM clinic phone appointment: 03/26/14. Impedence below baseline from ~11/3-11/6. He was asymptomatic at that time. He is unaware of any changes in his diet/ fluid intake. No changes made today.  Copy of note sent to patient's primary care physician, primary cardiologist, and device following physician.  Sherri RadHeather Tejon Gracie, RN, BSN 02/23/2014 10:34 AM

## 2014-03-21 ENCOUNTER — Encounter (HOSPITAL_COMMUNITY): Payer: Self-pay | Admitting: Internal Medicine

## 2014-03-26 ENCOUNTER — Ambulatory Visit (INDEPENDENT_AMBULATORY_CARE_PROVIDER_SITE_OTHER): Payer: Medicare Other | Admitting: *Deleted

## 2014-03-26 DIAGNOSIS — I519 Heart disease, unspecified: Secondary | ICD-10-CM

## 2014-03-26 DIAGNOSIS — Z95810 Presence of automatic (implantable) cardiac defibrillator: Secondary | ICD-10-CM

## 2014-03-27 ENCOUNTER — Encounter: Payer: Self-pay | Admitting: *Deleted

## 2014-03-27 NOTE — Progress Notes (Signed)
EPIC Encounter for ICM Monitoring  Patient Name: Dave Daniels is a 73 y.o. male Date: 03/27/2014 Primary Care Physican: Doreatha MartinVELAZQUEZ,GRETCHEN, MD Primary Cardiologist: Jens Somrenshaw Electrophysiologist: [name] Dry Weight: 172 lbs      In the past month, have you:  1. Gained more than 2 pounds in a day or more than 5 pounds in a week? no  2. Had changes in your medications (with verification of current medications)? no  3. Had more shortness of breath than is usual for you? no  4. Limited your activity because of shortness of breath? no  5. Not been able to sleep because of shortness of breath? no  6. Had increased swelling in your feet or ankles? no  7. Had symptoms of dehydration (dizziness, dry mouth, increased thirst, decreased urine output) no  8. Had changes in sodium restriction? no  9. Been compliant with medication? Yes   ICM trend:   Follow-up plan: ICM clinic phone appointment: 04/30/14  Copy of note sent to patient's primary care physician, primary cardiologist, and device following physician.  Sherri RadHeather Euel Castile, RN, BSN 03/27/2014 4:58 PM

## 2014-04-30 ENCOUNTER — Ambulatory Visit (INDEPENDENT_AMBULATORY_CARE_PROVIDER_SITE_OTHER): Payer: Medicare HMO | Admitting: *Deleted

## 2014-04-30 DIAGNOSIS — Z95810 Presence of automatic (implantable) cardiac defibrillator: Secondary | ICD-10-CM | POA: Diagnosis not present

## 2014-04-30 DIAGNOSIS — I519 Heart disease, unspecified: Secondary | ICD-10-CM | POA: Diagnosis not present

## 2014-05-02 ENCOUNTER — Encounter: Payer: Self-pay | Admitting: *Deleted

## 2014-05-02 NOTE — Progress Notes (Signed)
EPIC Encounter for ICM Monitoring  Patient Name: Dave Daniels is a 74 y.o. male Date: 05/02/2014 Primary Care Physican: Doreatha MartinVELAZQUEZ,GRETCHEN, MD Primary Cardiologist: Jens Somrenshaw Electrophysiologist: [name] Dry Weight: 172 lbs  Bi-V pacing: 99%       In the past month, have you:  1. Gained more than 2 pounds in a day or more than 5 pounds in a week? no  2. Had changes in your medications (with verification of current medications)? no  3. Had more shortness of breath than is usual for you? no  4. Limited your activity because of shortness of breath? no  5. Not been able to sleep because of shortness of breath? no  6. Had increased swelling in your feet or ankles? no  7. Had symptoms of dehydration (dizziness, dry mouth, increased thirst, decreased urine output) no. He does report a couple of episodes of feeling dizzy while sitting and eating breakfast. These episodes only lasted about 2 minutes each. This was prior to any medications. I advised that unless these become more frequent and lasting longer and that he is noticing BP drops, then we would not need to do anything differently at this time.  8. Had changes in sodium restriction? no  9. Been compliant with medication? Yes   ICM trend:   Follow-up plan: ICM clinic phone appointment: 06/28/14. The patient has follow up scheduled on 05/28/14 with Dr. Johney FrameAllred.  Copy of note sent to patient's primary care physician, primary cardiologist, and device following physician.  Sherri RadHeather Kareena Arrambide, RN, BSN 05/02/2014 2:29 PM

## 2014-05-28 ENCOUNTER — Encounter: Payer: Medicare Other | Admitting: Internal Medicine

## 2014-06-28 ENCOUNTER — Ambulatory Visit (INDEPENDENT_AMBULATORY_CARE_PROVIDER_SITE_OTHER): Payer: Medicare HMO | Admitting: *Deleted

## 2014-06-28 DIAGNOSIS — I255 Ischemic cardiomyopathy: Secondary | ICD-10-CM

## 2014-06-28 DIAGNOSIS — I519 Heart disease, unspecified: Secondary | ICD-10-CM

## 2014-06-28 DIAGNOSIS — Z95810 Presence of automatic (implantable) cardiac defibrillator: Secondary | ICD-10-CM

## 2014-07-02 ENCOUNTER — Encounter: Payer: Self-pay | Admitting: *Deleted

## 2014-07-02 NOTE — Progress Notes (Signed)
EPIC Encounter for ICM Monitoring  Patient Name: Dave ServiceFabio A Guida is a 74 y.o. male Date: 07/02/2014 Primary Care Physican: Doreatha MartinVELAZQUEZ,GRETCHEN, MD Primary Cardiologist: Jens Somrenshaw Electrophysiologist: [name] Dry Weight: 172 lbs  Bi- V pacing: 99%       In the past month, have you:  1. Gained more than 2 pounds in a day or more than 5 pounds in a week? No. The patient states his weights have been very stable. He has not seen this go above 174 lbs over the last month and this comes down very quickly.   2. Had changes in your medications (with verification of current medications)? no  3. Had more shortness of breath than is usual for you? no  4. Limited your activity because of shortness of breath? no  5. Not been able to sleep because of shortness of breath? no  6. Had increased swelling in your feet or ankles? no  7. Had symptoms of dehydration (dizziness, dry mouth, increased thirst, decreased urine output) no  8. Had changes in sodium restriction? no  9. Been compliant with medication? Yes   ICM trend:   Follow-up plan: ICM clinic phone appointment: 08/23/14. The patient's optivol readings were up slightly from ~ 2/26-3/3. He denies any change in his symptoms or change in his weight. He is due to see Dr. Johney FrameAllred back 07/23/10.   Copy of note sent to patient's primary care physician, primary cardiologist, and device following physician.  Sherri RadHeather McGhee, RN, BSN 07/02/2014 12:28 PM

## 2014-07-19 ENCOUNTER — Other Ambulatory Visit: Payer: Self-pay

## 2014-07-23 ENCOUNTER — Encounter: Payer: Self-pay | Admitting: Internal Medicine

## 2014-07-23 ENCOUNTER — Other Ambulatory Visit: Payer: Self-pay

## 2014-07-23 ENCOUNTER — Ambulatory Visit (INDEPENDENT_AMBULATORY_CARE_PROVIDER_SITE_OTHER): Payer: Medicare HMO | Admitting: Internal Medicine

## 2014-07-23 VITALS — BP 122/74 | HR 60 | Ht 67.0 in | Wt 177.6 lb

## 2014-07-23 DIAGNOSIS — I255 Ischemic cardiomyopathy: Secondary | ICD-10-CM

## 2014-07-23 DIAGNOSIS — I519 Heart disease, unspecified: Secondary | ICD-10-CM

## 2014-07-23 LAB — MDC_IDC_ENUM_SESS_TYPE_INCLINIC
Battery Remaining Longevity: 42 mo
Brady Statistic RA Percent Paced: 40 %
Date Time Interrogation Session: 20160411131453
HIGH POWER IMPEDANCE MEASURED VALUE: 76.5 Ohm
Implantable Pulse Generator Model: 3265
Implantable Pulse Generator Serial Number: 7016547
Lead Channel Impedance Value: 375 Ohm
Lead Channel Impedance Value: 537.5 Ohm
Lead Channel Pacing Threshold Amplitude: 0.75 V
Lead Channel Pacing Threshold Amplitude: 0.75 V
Lead Channel Pacing Threshold Amplitude: 1 V
Lead Channel Pacing Threshold Pulse Width: 0.5 ms
Lead Channel Sensing Intrinsic Amplitude: 1.5 mV
Lead Channel Sensing Intrinsic Amplitude: 12 mV
Lead Channel Setting Pacing Amplitude: 2 V
Lead Channel Setting Pacing Amplitude: 2 V
Lead Channel Setting Pacing Amplitude: 2.25 V
Lead Channel Setting Pacing Pulse Width: 0.8 ms
Lead Channel Setting Sensing Sensitivity: 0.5 mV
MDC IDC MSMT LEADCHNL LV PACING THRESHOLD PULSEWIDTH: 0.8 ms
MDC IDC MSMT LEADCHNL RA IMPEDANCE VALUE: 462.5 Ohm
MDC IDC MSMT LEADCHNL RV PACING THRESHOLD PULSEWIDTH: 0.5 ms
MDC IDC SET LEADCHNL RV PACING PULSEWIDTH: 0.5 ms
MDC IDC STAT BRADY RV PERCENT PACED: 99 %
Zone Setting Detection Interval: 270 ms
Zone Setting Detection Interval: 320 ms

## 2014-07-23 NOTE — Progress Notes (Signed)
Electrophysiology Office Note   Date:  07/23/2014   ID:  Dave Daniels, DOB 07-09-1940, MRN [id number]  PCP:  Doreatha MartinVELAZQUEZ,GRETCHEN, MD  Cardiologist:  Dr Dave Daniels Primary Electrophysiologist: Dave RangeJames Kyiesha Millward, MD    Chief Complaint  Patient presents with  . Follow-up    SOB     History of Present Illness: Dave Daniels is a 74 y.o. male who presents today for electrophysiology evaluation.   Doing well with NYHA Class II CHF symptoms.  He remains very active, without limitation.  Today, he denies symptoms of palpitations, chest pain, shortness of breath, orthopnea, PND, lower extremity edema, claudication, dizziness, presyncope, syncope, bleeding, or neurologic sequela. The patient is tolerating medications without difficulties and is otherwise without complaint today.    Past Medical History  Diagnosis Date  . Hypertension   . Hyperlipidemia   . Gout   . CAD (coronary artery disease)     [place], [place] [facility] [facility].  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  . Ischemic cardiomyopathy     EF 15% by echo 11/2010 and still 20-25% by follow-up echo 02/2011, s/p [employer] Bi-V ICD implantation 04/01/11  . LBBB (left bundle branch block)   . Renal insufficiency     Cr 1.6 on 03/25/11   Past Surgical History  Procedure Laterality Date  . Coronary artery bypass graft  3/12    [place]  . Tonsillectomy    . Umbilical hernia repair    . Inguinal hernia repair    . Carpel tunnel surgery    . Cardiac defibrillator placement  12/12    BiV ICD (SJM) implanted by Dr Dave Daniels  . Bi-ventricular implantable cardioverter defibrillator N/A 04/01/2011    Procedure: BI-VENTRICULAR IMPLANTABLE CARDIOVERTER DEFIBRILLATOR  (CRT-D);  Surgeon: Dave MawGregg W Taylor, MD;  [facility];  Service: Cardiovascular;  Laterality: N/A;     Current Outpatient Prescriptions  Medication Sig Dispense Refill  . allopurinol (ZYLOPRIM) 100 MG tablet Take 100 mg by mouth  daily.    Marland Kitchen. atorvastatin (LIPITOR) 40 MG tablet Take 1 tablet by mouth daily.    . carvedilol (COREG) 12.5 MG tablet Take 1 tablet (12.5 mg total) by mouth 2 (two) times daily with a meal. 30 tablet 10  . colchicine 0.6 MG tablet Take 0.6 mg by mouth daily as needed. Gout    . CRANBERRY PO Take 1 capsule by mouth daily.    . furosemide (LASIX) 40 MG tablet Take one and one half tablets once daily (Patient taking differently: Take one and one half tablets by mouth once daily) 45 tablet 12  . losartan (COZAAR) 25 MG tablet Take 1 tablet by mouth daily.  3  . potassium chloride SA (K-DUR,KLOR-CON) 20 MEQ tablet Take 20 mEq by mouth daily.     No current facility-administered medications for this visit.    Allergies:   Statins   Social History:  The patient  reports that he has never smoked. He has never used smokeless tobacco. He reports that he drinks alcohol. He reports that he does not use illicit drugs.   ROS:  Please see the history of present illness.   All other systems are reviewed and negative.    PHYSICAL EXAM: VS:  BP 122/74 mmHg  Pulse 60  Ht 5\' 7"  (1.702 m)  Wt 177 lb 9.6 oz (80.559 kg)  BMI 27.81 kg/m2 , BMI Body mass index is 27.81 kg/(m^2). GEN: Well nourished, well developed,  in no acute distress HEENT: normal Neck: no JVD, carotid bruits, or masses Cardiac: RRR; no murmurs, rubs, or gallops,no edema  Respiratory:  clear to auscultation bilaterally, normal work of breathing GI: soft, nontender, nondistended, + BS MS: no deformity or atrophy Skin: warm and dry, device pocket is well healed Neuro:  Strength and sensation are intact Psych: euthymic mood, full affect  EKG:  EKG is ordered today. The ekg ordered today shows sinus with BiV pacing  Device interrogation is reviewed today in detail.  See PaceArt for details.   Recent Labs: 12/01/2013: BUN 20; Creatinine 1.4; Potassium 4.4; Sodium 144    Lipid Panel     Component Value Date/Time   CHOL 153  07/13/2012 1434   TRIG 102 07/13/2012 1434   HDL 41 07/13/2012 1434   CHOLHDL 3.7 07/13/2012 1434   VLDL 20 07/13/2012 1434   LDLCALC 92 07/13/2012 1434     Wt Readings from Last 3 Encounters:  07/23/14 177 lb 9.6 oz (80.559 kg)  06/02/13 158 lb (71.668 kg)  07/13/12 169 lb (76.658 kg)     ASSESSMENT AND PLAN:  1.  Chronic systolic dysfunction euvolemic today Normal BiV ICD function See Pace Art report For reduce RV threshold, reprogrammed LV lead from M3-P4 to M3-RV.  Needs to follow-up with Dr Dave Daniels I will see again in 1 year Current medicines are reviewed at length with the patient today.   The patient does not have concerns regarding his medicines.  The following changes were made today:  none  Signed, Dave Range, MD  07/23/2014 1:09 PM     [facility] [address] [place] [address] [phone] (office) [phone] (fax)

## 2014-07-23 NOTE — Patient Instructions (Signed)
Your physician wants you to follow-up in: 12 months with Dr. Johney FrameAllred. You will receive a reminder letter in the mail two months in advance. If you don't receive a letter, please call our office to schedule the follow-up appointment.  Remote monitoring is used to monitor your Pacemaker or ICD from home. This monitoring reduces the number of office visits required to check your device to one time per year. It allows us to keep an eye on the functioning of your device to ensure it is working properly. You are scheduled for a device check from home on 10/22/14. You may send your transmission at any time that day. If you have a wireless device, the transmission will be sent automatically. After your physician reviews your transmission, you will receive a postcard with your next transmission date.  Your physician wants you to follow-up in: (Next available) 3 months with Dr. Jens Somrenshaw for a routine visit.

## 2014-08-23 ENCOUNTER — Telehealth: Payer: Self-pay | Admitting: Cardiology

## 2014-08-23 ENCOUNTER — Ambulatory Visit (INDEPENDENT_AMBULATORY_CARE_PROVIDER_SITE_OTHER): Payer: Medicare HMO | Admitting: *Deleted

## 2014-08-23 DIAGNOSIS — Z95810 Presence of automatic (implantable) cardiac defibrillator: Secondary | ICD-10-CM

## 2014-08-23 DIAGNOSIS — I255 Ischemic cardiomyopathy: Secondary | ICD-10-CM | POA: Diagnosis not present

## 2014-08-23 DIAGNOSIS — I519 Heart disease, unspecified: Secondary | ICD-10-CM

## 2014-08-23 NOTE — Telephone Encounter (Signed)
Spoke with pt and reminded pt of remote transmission that is due today. Pt verbalized understanding.   

## 2014-08-24 ENCOUNTER — Encounter: Payer: Self-pay | Admitting: *Deleted

## 2014-08-24 NOTE — Progress Notes (Signed)
EPIC Encounter for ICM Monitoring  Patient Name: [place] ServiceFabio A Cullipher is a 74 y.o. male Date: 08/24/2014 Primary Care Physican: Doreatha MartinVELAZQUEZ,GRETCHEN, MD Primary Cardiologist: Jens Somrenshaw Electrophysiologist: [name] Dry Weight: 172 lbs  Bi-V pacing: >99%       In the past month, have you:  1. Gained more than 2 pounds in a day or more than 5 pounds in a week? no  2. Had changes in your medications (with verification of current medications)? no  3. Had more shortness of breath than is usual for you? no  4. Limited your activity because of shortness of breath? no  5. Not been able to sleep because of shortness of breath? no  6. Had increased swelling in your feet or ankles? no  7. Had symptoms of dehydration (dizziness, dry mouth, increased thirst, decreased urine output) no  8. Had changes in sodium restriction? no  9. Been compliant with medication? Yes   ICM trend:   Follow-up plan: ICM clinic phone appointment: 09/27/14. No changes made today.   Copy of note sent to patient's primary care physician, primary cardiologist, and device following physician.  Sherri RadHeather McGhee, RN, BSN 08/24/2014 12:56 PM

## 2014-09-03 ENCOUNTER — Other Ambulatory Visit: Payer: Self-pay | Admitting: Cardiology

## 2014-09-27 ENCOUNTER — Encounter: Payer: Self-pay | Admitting: *Deleted

## 2014-09-27 ENCOUNTER — Ambulatory Visit (INDEPENDENT_AMBULATORY_CARE_PROVIDER_SITE_OTHER): Payer: Medicare HMO | Admitting: *Deleted

## 2014-09-27 DIAGNOSIS — Z95810 Presence of automatic (implantable) cardiac defibrillator: Secondary | ICD-10-CM | POA: Diagnosis not present

## 2014-09-27 DIAGNOSIS — I255 Ischemic cardiomyopathy: Secondary | ICD-10-CM

## 2014-09-27 DIAGNOSIS — I519 Heart disease, unspecified: Secondary | ICD-10-CM

## 2014-09-27 NOTE — Progress Notes (Signed)
EPIC Encounter for ICM Monitoring  Patient Name: Dave Daniels is a 74 y.o. male Date: 09/27/2014 Primary Care Physican: Doreatha Martin, MD Primary Cardiologist: Jens Som Electrophysiologist: [name] Dry Weight: 172-174 lbs  Bi-V pacing: 98%       In the past month, have you:  1. Gained more than 2 pounds in a day or more than 5 pounds in a week? no  2. Had changes in your medications (with verification of current medications)? no  3. Had more shortness of breath than is usual for you? no  4. Limited your activity because of shortness of breath? no  5. Not been able to sleep because of shortness of breath? no  6. Had increased swelling in your feet or ankles? no  7. Had symptoms of dehydration (dizziness, dry mouth, increased thirst, decreased urine output) no  8. Had changes in sodium restriction? no  9. Been compliant with medication? Yes   ICM trend:   Follow-up plan: ICM clinic phone appointment:  10/29/14. No changes made today.  Copy of note sent to patient's primary care physician, primary cardiologist, and device following physician.  Sherri Rad, RN, BSN 09/27/2014 2:38 PM

## 2014-10-23 NOTE — Progress Notes (Signed)
HPI: FU coronary artery disease status post coronary artery bypass and graft as well as ischemic cardiomyopathy. Patient's cardiac history dates back to 2010 when he had his first myocardial infarction. [place]. He had a second myocardial infarction in March of 2012 and then had coronary artery bypass and graft. He was again admitted in August of 2012. He ruled in for a small subendocardial myocardial infarction. Cardiac catheterization in August of 2012 showed EF of 20%. The right coronary and LAD were occluded and there was critical circumflex disease. There was a patent saphenous vein graft to the right coronary artery. Patient had biventricular ICD placed in December of 2012. Last echocardiogram in January of 2014 showed an ejection fraction of 25-30%, mild to moderate left atrial enlargement and mild aortic insufficiency. Since I last saw him, the patient denies any dyspnea on exertion, orthopnea, PND, pedal edema, palpitations, syncope or chest pain.   Current Outpatient Prescriptions  Medication Sig Dispense Refill  . allopurinol (ZYLOPRIM) 100 MG tablet Take 100 mg by mouth daily.    . carvedilol (COREG) 12.5 MG tablet TAKE 1 TABLET BY MOUTH TWICE DAILY, WITH A MEAL 30 tablet 1  . colchicine 0.6 MG tablet Take 0.6 mg by mouth daily as needed. Gout    . furosemide (LASIX) 40 MG tablet Take one and one half tablets once daily (Patient taking differently: Take one and one half tablets by mouth once daily) 45 tablet 12  . losartan (COZAAR) 25 MG tablet Take 1 tablet by mouth daily.  3  . potassium chloride SA (K-DUR,KLOR-CON) 20 MEQ tablet Take 20 mEq by mouth daily.    . pravastatin (PRAVACHOL) 80 MG tablet Take 80 mg by mouth daily.     No current facility-administered medications for this visit.     Past Medical History  Diagnosis Date  . Hypertension   . Hyperlipidemia   . Gout   . CAD (coronary artery disease)     [place] stenting in 2010, then  [place] [facility].  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  . Ischemic cardiomyopathy     EF 15% by echo 11/2010 and still 20-25% by follow-up echo 02/2011, s/p [employer] Jude Bi-V ICD implantation 04/01/11  . LBBB (left bundle branch block)   . Renal insufficiency     Cr 1.6 on 03/25/11    Past Surgical History  Procedure Laterality Date  . Coronary artery bypass graft  3/12    in [place]  . Tonsillectomy    . Umbilical hernia repair    . Inguinal hernia repair    . Carpel tunnel surgery    . Cardiac defibrillator placement  12/12    BiV ICD (SJM) implanted by Dr Johney FrameAllred  . Bi-ventricular implantable cardioverter defibrillator N/A 04/01/2011    Procedure: BI-VENTRICULAR IMPLANTABLE CARDIOVERTER DEFIBRILLATOR  (CRT-D);  Surgeon: Marinus MawGregg W Taylor, MD;  [facility];  Service: Cardiovascular;  Laterality: N/A;    History   Social History  . Marital Status: Married    Spouse Name: N/A  . Number of Children: 4  . Years of Education: N/A   Occupational History  .      Retired   Social History Main Topics  . Smoking status: Never Smoker   . Smokeless tobacco: Never Used  . Alcohol Use: Yes     Comment: occasional  . Drug Use: No  . Sexual Activity: Yes   Other Topics Concern  .  Not on file   Social History Narrative   Lives in [place], recently moved from [place].  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 Remaining systems are negative.  Physical Exam: Well-developed well-nourished in no acute distress.  Skin is warm and dry.  HEENT is normal.  Neck is supple.  Chest is clear to auscultation with normal expansion.  Cardiovascular exam is regular rate and rhythm.  Abdominal exam nontender or distended. No masses palpated. Extremities show no edema. neuro grossly intact

## 2014-10-24 ENCOUNTER — Encounter: Payer: Self-pay | Admitting: Cardiology

## 2014-10-24 ENCOUNTER — Ambulatory Visit (INDEPENDENT_AMBULATORY_CARE_PROVIDER_SITE_OTHER): Payer: Medicare HMO | Admitting: Cardiology

## 2014-10-24 VITALS — BP 111/68 | HR 60 | Ht 67.0 in | Wt 175.0 lb

## 2014-10-24 DIAGNOSIS — Z95810 Presence of automatic (implantable) cardiac defibrillator: Secondary | ICD-10-CM

## 2014-10-24 DIAGNOSIS — I519 Heart disease, unspecified: Secondary | ICD-10-CM | POA: Diagnosis not present

## 2014-10-24 DIAGNOSIS — I10 Essential (primary) hypertension: Secondary | ICD-10-CM

## 2014-10-24 DIAGNOSIS — Z4502 Encounter for adjustment and management of automatic implantable cardiac defibrillator: Secondary | ICD-10-CM

## 2014-10-24 DIAGNOSIS — I255 Ischemic cardiomyopathy: Secondary | ICD-10-CM | POA: Diagnosis not present

## 2014-10-24 DIAGNOSIS — E785 Hyperlipidemia, unspecified: Secondary | ICD-10-CM

## 2014-10-24 MED ORDER — CARVEDILOL 12.5 MG PO TABS: 12.5000 mg | ORAL_TABLET | Freq: Two times a day (BID) | ORAL | Status: DC

## 2014-10-24 MED ORDER — POTASSIUM CHLORIDE CRYS ER 20 MEQ PO TBCR: 20.0000 meq | EXTENDED_RELEASE_TABLET | Freq: Every day | ORAL | Status: DC

## 2014-10-24 MED ORDER — FUROSEMIDE 40 MG PO TABS: ORAL_TABLET | ORAL | Status: DC

## 2014-10-24 NOTE — Assessment & Plan Note (Signed)
Continue statin. Lipids and liver monitored by primary care. 

## 2014-10-24 NOTE — Assessment & Plan Note (Signed)
Continue statin. Patient is not taking aspirin. We will add 81 mg daily.

## 2014-10-24 NOTE — Patient Instructions (Addendum)
Medication Instructions:  - start aspirin ( 81 mg ) daily  Labwork: none  Testing/Procedures: none  Follow-Up: Your physician wants you to follow-up in: 1 year f/u with Dr. Jens Somrenshaw.  You will receive a reminder letter in the mail two months in advance. If you don't receive a letter, please call our office to schedule the follow-up appointment.   Any Other Special Instructions Will Be Listed Below (If Applicable).

## 2014-10-24 NOTE — Assessment & Plan Note (Signed)
Continue present dose of Lasix.euvolemic on examination. Potassium and renal function monitored by primary care.

## 2014-10-24 NOTE — Assessment & Plan Note (Signed)
Continue beta blocker and ARB. 

## 2014-10-24 NOTE — Assessment & Plan Note (Signed)
Followed by electrophysiology. 

## 2014-10-24 NOTE — Assessment & Plan Note (Signed)
Blood pressure controlled. Continue present medications. 

## 2014-10-25 ENCOUNTER — Encounter: Payer: Self-pay | Admitting: Cardiology

## 2014-10-29 ENCOUNTER — Ambulatory Visit (INDEPENDENT_AMBULATORY_CARE_PROVIDER_SITE_OTHER): Payer: Medicare HMO | Admitting: *Deleted

## 2014-10-29 ENCOUNTER — Encounter: Payer: Self-pay | Admitting: Internal Medicine

## 2014-10-29 ENCOUNTER — Telehealth: Payer: Self-pay | Admitting: *Deleted

## 2014-10-29 ENCOUNTER — Telehealth: Payer: Self-pay | Admitting: Cardiology

## 2014-10-29 DIAGNOSIS — Z95810 Presence of automatic (implantable) cardiac defibrillator: Secondary | ICD-10-CM

## 2014-10-29 DIAGNOSIS — I255 Ischemic cardiomyopathy: Secondary | ICD-10-CM

## 2014-10-29 DIAGNOSIS — I519 Heart disease, unspecified: Secondary | ICD-10-CM | POA: Diagnosis not present

## 2014-10-29 NOTE — Progress Notes (Signed)
Remote ICD transmission.   

## 2014-10-29 NOTE — Telephone Encounter (Signed)
ICM transmission received. I left a message with the patient's wife to have him call me back.

## 2014-10-29 NOTE — Telephone Encounter (Signed)
LMOVM reminding pt to send remote transmission.   

## 2014-10-30 LAB — CUP PACEART REMOTE DEVICE CHECK
Battery Remaining Percentage: 49 %
Battery Voltage: 2.92 V
Brady Statistic AP VP Percent: 43 %
Brady Statistic AP VS Percent: 1 %
Brady Statistic AS VP Percent: 54 %
Brady Statistic RA Percent Paced: 42 %
HIGH POWER IMPEDANCE MEASURED VALUE: 71 Ohm
HighPow Impedance: 71 Ohm
Lead Channel Impedance Value: 400 Ohm
Lead Channel Impedance Value: 450 Ohm
Lead Channel Pacing Threshold Amplitude: 0.75 V
Lead Channel Pacing Threshold Amplitude: 0.75 V
Lead Channel Pacing Threshold Amplitude: 1 V
Lead Channel Pacing Threshold Pulse Width: 0.8 ms
Lead Channel Setting Pacing Amplitude: 2 V
Lead Channel Setting Pacing Pulse Width: 0.5 ms
Lead Channel Setting Sensing Sensitivity: 0.5 mV
MDC IDC MSMT BATTERY REMAINING LONGEVITY: 37 mo
MDC IDC MSMT LEADCHNL RA PACING THRESHOLD PULSEWIDTH: 0.5 ms
MDC IDC MSMT LEADCHNL RA SENSING INTR AMPL: 5 mV
MDC IDC MSMT LEADCHNL RV IMPEDANCE VALUE: 350 Ohm
MDC IDC MSMT LEADCHNL RV PACING THRESHOLD PULSEWIDTH: 0.5 ms
MDC IDC MSMT LEADCHNL RV SENSING INTR AMPL: 12 mV
MDC IDC SESS DTM: 20160718175940
MDC IDC SET LEADCHNL LV PACING AMPLITUDE: 2.25 V
MDC IDC SET LEADCHNL LV PACING PULSEWIDTH: 0.8 ms
MDC IDC SET LEADCHNL RA PACING AMPLITUDE: 2 V
MDC IDC SET ZONE DETECTION INTERVAL: 320 ms
MDC IDC STAT BRADY AS VS PERCENT: 1 %
Pulse Gen Model: 3265
Pulse Gen Serial Number: 7016547
Zone Setting Detection Interval: 270 ms

## 2014-11-01 ENCOUNTER — Encounter: Payer: Self-pay | Admitting: *Deleted

## 2014-11-01 NOTE — Addendum Note (Signed)
Addended by: Sherri Rad C on: 11/01/2014 10:11 AM   Modules accepted: Level of Service

## 2014-11-01 NOTE — Progress Notes (Signed)
EPIC Encounter for ICM Monitoring  Patient Name: Dave Daniels is a 74 y.o. male Date: 11/01/2014 Primary Care Physican: Doreatha Martin, MD Primary Cardiologist: Jens Som Electrophysiologist: [name] Dry Weight: 172-174 lbs   Bi-V pacing: 97%      In the past month, have you:  1. Gained more than 2 pounds in a day or more than 5 pounds in a week? no  2. Had changes in your medications (with verification of current medications)? Yes- the patient saw Dr. Jens Som on 10/24/14 and he was started on ASA 81 mg once daily.  3. Had more shortness of breath than is usual for you? no  4. Limited your activity because of shortness of breath? no  5. Not been able to sleep because of shortness of breath? no  6. Had increased swelling in your feet or ankles? no  7. Had symptoms of dehydration (dizziness, dry mouth, increased thirst, decreased urine output) no  8. Had changes in sodium restriction? no  9. Been compliant with medication? Yes   ICM trend:   Follow-up plan: ICM clinic phone appointment: 12/03/14. No changes made today.  Copy of note sent to patient's primary care physician, primary cardiologist, and device following physician.  Sherri Rad, RN, BSN 11/01/2014 9:55 AM

## 2014-11-01 NOTE — Telephone Encounter (Signed)
I spoke with the patient. 

## 2014-11-16 ENCOUNTER — Encounter: Payer: Self-pay | Admitting: Cardiology

## 2014-12-03 ENCOUNTER — Telehealth: Payer: Self-pay | Admitting: Cardiology

## 2014-12-03 ENCOUNTER — Ambulatory Visit (INDEPENDENT_AMBULATORY_CARE_PROVIDER_SITE_OTHER): Payer: Medicare HMO | Admitting: *Deleted

## 2014-12-03 DIAGNOSIS — Z95810 Presence of automatic (implantable) cardiac defibrillator: Secondary | ICD-10-CM | POA: Diagnosis not present

## 2014-12-03 DIAGNOSIS — I255 Ischemic cardiomyopathy: Secondary | ICD-10-CM

## 2014-12-03 NOTE — Telephone Encounter (Signed)
Spoke with pt and reminded pt of remote transmission that is due today. Pt verbalized understanding.   

## 2014-12-06 NOTE — Progress Notes (Addendum)
EPIC Encounter for ICM Monitoring  Patient Name: Dave Daniels is a 74 y.o. male Date: 12/06/2014 Primary Care Physican: Doreatha Martin, MD Primary Cardiologist: Jens Som Electrophysiologist: [name] Dry Weight: 173 lbs  Bi-V Pacing 98%       In the past month, have you:  1. Gained more than 2 pounds in a day or more than 5 pounds in a week? no  2. Had changes in your medications (with verification of current medications)? no  3. Had more shortness of breath than is usual for you? no  4. Limited your activity because of shortness of breath? no  5. Not been able to sleep because of shortness of breath? no  6. Had increased swelling in your feet or ankles? no  7. Had symptoms of dehydration (dizziness, dry mouth, increased thirst, decreased urine output) no  8. Had changes in sodium restriction? no  9. Been compliant with medication? Yes   ICM trend:   Follow-up plan: ICM clinic phone appointment 01/07/2015.  CorVue transmission revealed impedance below baseline 11/23/2014 to 11/26/2014 and above baseline 11/27/2014 to date of transmission 12/03/2014.  He reported he has been working in the lawn in the last week and may not be drinking as much.  Education given on dehydration symptoms and encouraged to drink more fluids especially when out side in heat and humidity.  He reported he is feeling well.  No changes today.    Copy of note sent to patient's primary care physician, primary cardiologist, and device following physician.  Karie Soda, RN, CCM 12/06/2014 9:29 AM

## 2015-01-02 DIAGNOSIS — Z2821 Immunization not carried out because of patient refusal: Secondary | ICD-10-CM | POA: Insufficient documentation

## 2015-01-03 DIAGNOSIS — R972 Elevated prostate specific antigen [PSA]: Secondary | ICD-10-CM | POA: Insufficient documentation

## 2015-01-07 ENCOUNTER — Ambulatory Visit (INDEPENDENT_AMBULATORY_CARE_PROVIDER_SITE_OTHER): Payer: Medicare HMO

## 2015-01-07 DIAGNOSIS — I255 Ischemic cardiomyopathy: Secondary | ICD-10-CM

## 2015-01-07 DIAGNOSIS — Z95810 Presence of automatic (implantable) cardiac defibrillator: Secondary | ICD-10-CM

## 2015-01-09 ENCOUNTER — Telehealth: Payer: Self-pay

## 2015-01-09 NOTE — Telephone Encounter (Signed)
ICM transmission received.  Attempted call to patient and left message for return call. 

## 2015-01-10 NOTE — Progress Notes (Signed)
EPIC Encounter for ICM Monitoring  Patient Name: Dave Daniels is a 74 y.o. male Date: 01/10/2015 Primary Care Physican: Doreatha Martin, MD Primary Cardiologist: Jens Som Electrophysiologist: [name] Dry Weight: 173 lbs  Bi-V Pacing 98%       In the past month, have you:  1. Gained more than 2 pounds in a day or more than 5 pounds in a week? no  2. Had changes in your medications (with verification of current medications)? no  3. Had more shortness of breath than is usual for you? no  4. Limited your activity because of shortness of breath? no  5. Not been able to sleep because of shortness of breath? no  6. Had increased swelling in your feet or ankles? no  7. Had symptoms of dehydration (dizziness, dry mouth, increased thirst, decreased urine output) no  8. Had changes in sodium restriction? no  9. Been compliant with medication? Yes   ICM trend: 01/07/2015   Follow-up plan: ICM clinic phone appointment 02/13/2015.  Impedance below baseline ~12/22/2014 to 12/24/2014 and ~01/05/2015 to 01/06/2015 and he denied any HF symptoms.  Impedance above baseline ~12/26/2014 to 01/02/2015.  He reported he is not consistent on oral intake.  Education given to balance daily fluid intake so he does not have periods of fluid retention and dehydration.  He stated he is feeling well at this time.  Impedance trended back to baseline at time of transmission.  No changes today.   Copy of note sent to patient's primary care physician, primary cardiologist, and device following physician.  Karie Soda, RN, CCM 01/10/2015 11:38 AM

## 2015-01-10 NOTE — Telephone Encounter (Signed)
Spoke with patient.

## 2015-02-13 ENCOUNTER — Ambulatory Visit (INDEPENDENT_AMBULATORY_CARE_PROVIDER_SITE_OTHER): Payer: Medicare HMO | Admitting: *Deleted

## 2015-02-13 DIAGNOSIS — Z95810 Presence of automatic (implantable) cardiac defibrillator: Secondary | ICD-10-CM

## 2015-02-13 DIAGNOSIS — Z4502 Encounter for adjustment and management of automatic implantable cardiac defibrillator: Secondary | ICD-10-CM | POA: Diagnosis not present

## 2015-02-13 DIAGNOSIS — I255 Ischemic cardiomyopathy: Secondary | ICD-10-CM

## 2015-02-13 NOTE — Progress Notes (Signed)
EPIC Encounter for ICM Monitoring  Patient Name: Dave Daniels is a 74 y.o. male Date: 02/13/2015 Primary Care Physican: Dave MartinVELAZQUEZ,GRETCHEN, MD Primary Cardiologist: Jens Somrenshaw Electrophysiologist: [name] Dry Weight: 172.8 lb   Bi-V Pacing 98%      In the past month, have you:  1. Gained more than 2 pounds in a day or more than 5 pounds in a week? no  2. Had changes in your medications (with verification of current medications)? no  3. Had more shortness of breath than is usual for you? no  4. Limited your activity because of shortness of breath? no  5. Not been able to sleep because of shortness of breath? no  6. Had increased swelling in your feet or ankles? no  7. Had symptoms of dehydration (dizziness, dry mouth, increased thirst, decreased urine output) no  8. Had changes in sodium restriction? no  9. Been compliant with medication? Yes   ICM trend: 02/13/2015   Follow-up plan: ICM clinic phone appointment 03/14/2015.  Corvue impedance trending below baseline 01/19/2015 to 01/28/2015, 02/03/2015 to 02/07/2015, 02/09/2015 to 02/11/2015 suggesting fluid retention.  He denied any HF symptoms and unsure why he may he have had fluid retention.  Education given to follow low sodium foods and ~64 oz of fluid daily.  Encouraged him to check food labels for sodium amounts and add up daily intake which should be no more than 2000mg  daily.  No changes today.  Copy of note sent to patient's primary care physician, primary cardiologist, and device following physician.  Karie SodaLaurie S Marica Trentham, RN, CCM 02/13/2015 1:08 PM

## 2015-02-15 NOTE — Progress Notes (Signed)
Remote ICD transmission.   

## 2015-02-25 ENCOUNTER — Telehealth: Payer: Self-pay

## 2015-02-25 NOTE — Telephone Encounter (Signed)
Call to patient.  He stated he will be out of town on scheduled ICM transmission date of 03/14/2015 and requested to change to 03/22/2015.   Date changed and next ICM transmission date is 03/22/2015.  Received voice mail message requesting date change for next ICM transmission and return call.

## 2015-03-01 ENCOUNTER — Telehealth: Payer: Self-pay | Admitting: Cardiology

## 2015-03-01 LAB — CUP PACEART REMOTE DEVICE CHECK
Battery Remaining Longevity: 36 mo
Battery Remaining Percentage: 45 %
Brady Statistic RA Percent Paced: 42 %
Brady Statistic RV Percent Paced: 98 %
HIGH POWER IMPEDANCE MEASURED VALUE: 80 Ohm
Implantable Lead Implant Date: 20121219
Implantable Lead Implant Date: 20121219
Implantable Lead Implant Date: 20121219
Implantable Lead Location: 753858
Implantable Lead Location: 753860
Lead Channel Impedance Value: 380 Ohm
Lead Channel Impedance Value: 460 Ohm
Lead Channel Impedance Value: 540 Ohm
Lead Channel Pacing Threshold Amplitude: 0.75 V
Lead Channel Pacing Threshold Pulse Width: 0.5 ms
Lead Channel Sensing Intrinsic Amplitude: 3.5 mV
Lead Channel Setting Pacing Amplitude: 2.25 V
Lead Channel Setting Pacing Pulse Width: 0.5 ms
Lead Channel Setting Pacing Pulse Width: 0.8 ms
Lead Channel Setting Sensing Sensitivity: 0.5 mV
MDC IDC LEAD LOCATION: 753859
MDC IDC MSMT LEADCHNL RV SENSING INTR AMPL: 12 mV
MDC IDC SESS DTM: 20161118133803
MDC IDC SET LEADCHNL RA PACING AMPLITUDE: 2 V
MDC IDC SET LEADCHNL RV PACING AMPLITUDE: 2 V
Pulse Gen Serial Number: 7016547

## 2015-03-01 NOTE — Telephone Encounter (Signed)
Spoke w/ pt and requested that he send a manual transmission due to the [facility] and his monitor not updating in over a week. Pt verbalized understanding and said he would send it on Monday.

## 2015-03-05 ENCOUNTER — Encounter: Payer: Self-pay | Admitting: Cardiology

## 2015-03-05 DIAGNOSIS — D696 Thrombocytopenia, unspecified: Secondary | ICD-10-CM | POA: Insufficient documentation

## 2015-03-15 ENCOUNTER — Telehealth: Payer: Self-pay | Admitting: Cardiology

## 2015-03-15 NOTE — Telephone Encounter (Signed)
LMOVM requesting that pt send a remote transmission from home monitor b/c we have not received one in at least 8 days.  

## 2015-03-22 ENCOUNTER — Encounter: Payer: Self-pay | Admitting: Cardiology

## 2015-03-22 ENCOUNTER — Ambulatory Visit (INDEPENDENT_AMBULATORY_CARE_PROVIDER_SITE_OTHER): Payer: Medicare HMO

## 2015-03-22 ENCOUNTER — Telehealth: Payer: Self-pay | Admitting: Cardiology

## 2015-03-22 DIAGNOSIS — I255 Ischemic cardiomyopathy: Secondary | ICD-10-CM | POA: Diagnosis not present

## 2015-03-22 DIAGNOSIS — Z95810 Presence of automatic (implantable) cardiac defibrillator: Secondary | ICD-10-CM

## 2015-03-22 NOTE — Telephone Encounter (Signed)
Spoke with pt and reminded pt of remote transmission that is due today. Pt verbalized understanding.   

## 2015-03-26 NOTE — Progress Notes (Addendum)
EPIC Encounter for ICM Monitoring  Patient Name: Dave Daniels is a 74 y.o. male Date: 03/26/2015 Primary Care Physican: Doreatha MartinVELAZQUEZ,GRETCHEN, MD Primary Cardiologist: Jens Somrenshaw Electrophysiologist: [name] Dry Weight: 171 lbs  Bi-V pacing 98%       In the past month, have you:  1. Gained more than 2 pounds in a day or more than 5 pounds in a week? no  2. Had changes in your medications (with verification of current medications)? no  3. Had more shortness of breath than is usual for you? no  4. Limited your activity because of shortness of breath? no  5. Not been able to sleep because of shortness of breath? no  6. Had increased swelling in your feet or ankles? no  7. Had symptoms of dehydration (dizziness, dry mouth, increased thirst, decreased urine output) no  8. Had changes in sodium restriction? no  9. Been compliant with medication? Yes   ICM trend: 03/25/2015  Follow-up plan: ICM clinic phone appointment on 04/24/2015.  Corvue daily impedance below baseline several episodes since 02/18/2015 suggesting fluid retention.  Impedance back to baseline in the last few days.  Patient reported he has probably eaten foods high in sodium than usual.  Education given to limit sodium intake to maximum of 2000 mg daily and fluid intake of 64 oz daily.  He denied any HF symptoms.  He has 2 trips planned in starting this week through January.  Each trip he will be gone for 3 weeks at a time.  Advised he should take the monitor due to the recent information related to his implantable defibrillator.  Explained St Jude has some defibrillators that may experience issue related to the battery and it can be depleted very quickly.  The remote monitoring is followed more closely to assess the battery life.  He stated he will not be able to take the monitor with him.  Advised to call asap should he feel the device vibrating.  He verbalized understanding.   Copy of note sent to patient's primary care  physician, primary cardiologist, and device following physician.  Karie SodaLaurie S Short, RN, CCM 03/26/2015 9:17 AM

## 2015-04-03 ENCOUNTER — Telehealth: Payer: Self-pay | Admitting: Cardiology

## 2015-04-03 NOTE — Telephone Encounter (Signed)
LMOVM for pt to send manual transmission w/ his home monitor b/c his home monitor has not updated in at least 8 days.

## 2015-04-12 ENCOUNTER — Telehealth: Payer: Self-pay | Admitting: Cardiology

## 2015-04-12 NOTE — Telephone Encounter (Signed)
LMOVM for pt to send manual transmission using home monitor b/c her monitor has not updated in at least 8 days.    

## 2015-04-24 ENCOUNTER — Ambulatory Visit (INDEPENDENT_AMBULATORY_CARE_PROVIDER_SITE_OTHER): Payer: Medicare HMO

## 2015-04-24 ENCOUNTER — Telehealth: Payer: Self-pay | Admitting: Cardiology

## 2015-04-24 DIAGNOSIS — Z95810 Presence of automatic (implantable) cardiac defibrillator: Secondary | ICD-10-CM | POA: Diagnosis not present

## 2015-04-24 DIAGNOSIS — I255 Ischemic cardiomyopathy: Secondary | ICD-10-CM

## 2015-04-24 NOTE — Telephone Encounter (Signed)
LMOVM reminding pt to send remote transmission.   

## 2015-04-25 NOTE — Progress Notes (Signed)
EPIC Encounter for ICM Monitoring  Patient Name: Dave Daniels is a 75 y.o. male Date: 04/25/2015 Primary Care Physican: Doreatha MartinVELAZQUEZ,GRETCHEN, MD Primary Cardiologist: Jens Somrenshaw Electrophysiologist: [name] Dry Weight: 169 lbs  Bi-V Pacing 98%       In the past month, have you:  1. Gained more than 2 pounds in a day or more than 5 pounds in a week? no  2. Had changes in your medications (with verification of current medications)? no  3. Had more shortness of breath than is usual for you? no  4. Limited your activity because of shortness of breath? no  5. Not been able to sleep because of shortness of breath? no  6. Had increased swelling in your feet or ankles? no  7. Had symptoms of dehydration (dizziness, dry mouth, increased thirst, decreased urine output) no  8. Had changes in sodium restriction? no  9. Been compliant with medication? No, he has not taken Furosemide dosage 5 times in the last 3 weeks while traveling.    ICM trend: 3 month view 04/25/2015  ICM trend; 1 year view 04/25/2015   Follow-up plan: ICM clinic phone appointment on 04/30/2015.  CORVUE: Daily impedance below reference line 04/23/2015 to 04/25/2015 suggesting fluid retention.   He denied HF symptoms but did stated he has sinus and chest congestion due to cold/allergies.  He stated other than the cold he is feeling fine.  He stated the fluid may be eating higher sodium foods while he was traveling and leaves again on 1/201/2017 for another 3 week trip.  Encouraged to resume low sodium diet.    10/24/2014 ab results:  Creatinine 1.5, BUN 32 and Potassium 5.3.     Call to PCP, Dr Baldo AshVelaquez's office to obtain latest Metabolic Panel and person answering phone stated she has results from one completed in 01/02/2015.  She will fax a copy of the results.   01/02/2015 copy of lab results:  Creatinine 1.6, BUN 31 and Potassium 4.4  Advised Dr Jens Somrenshaw and Dr Johney FrameAllred will review and will call back if any recommendations.     Copy of note sent to patient's primary care physician, primary cardiologist, and device following physician.  [name], RN, CCM 04/25/2015 11:44 AM

## 2015-04-26 ENCOUNTER — Telehealth: Payer: Self-pay

## 2015-04-26 ENCOUNTER — Encounter: Payer: Self-pay | Admitting: Internal Medicine

## 2015-04-26 NOTE — Telephone Encounter (Signed)
Received call from patient asking about the billing for remote transmission and his device.  Ad[facility] to [facility] for more information.  He stated he would do so and call later today.

## 2015-04-30 ENCOUNTER — Ambulatory Visit (INDEPENDENT_AMBULATORY_CARE_PROVIDER_SITE_OTHER): Payer: Medicare HMO

## 2015-04-30 DIAGNOSIS — I255 Ischemic cardiomyopathy: Secondary | ICD-10-CM

## 2015-04-30 DIAGNOSIS — Z95810 Presence of automatic (implantable) cardiac defibrillator: Secondary | ICD-10-CM

## 2015-04-30 NOTE — Progress Notes (Signed)
EPIC Encounter for ICM Monitoring  Patient Name: Dave Daniels is a 75 y.o. male Date: 04/30/2015 Primary Care Physican: Doreatha Martin, MD Primary Cardiologist: Jens Som Electrophysiologist: [name] Dry Weight: 169 lbs  Bi-V Pacing 98%       In the past month, have you:  1. Gained more than 2 pounds in a day or more than 5 pounds in a week? no  2. Had changes in your medications (with verification of current medications)? no  3. Had more shortness of breath than is usual for you? no  4. Limited your activity because of shortness of breath? no  5. Not been able to sleep because of shortness of breath? no  6. Had increased swelling in your feet or ankles? no  7. Had symptoms of dehydration (dizziness, dry mouth, increased thirst, decreased urine output) no  8. Had changes in sodium restriction? no  9. Been compliant with medication? Yes   ICM trend: 3 month view 04/30/2015  ICM trend: 1 year view 04/30/2015   Follow-up plan: ICM clinic phone appointment 06/14/2015.  Corvue:  Daily thoracic impedance returned to baseline since last ICM transmission on 04/24/2015.  He has returned to his normal routine since last vacation and denied any symptoms.  He starts vacation tomorrow and will return approximately 06/12/2015.  Encouraged him to call should he have any problems with fluid retention.   No changes today.   Copy of note sent to patient's primary care physician, primary cardiologist, and device following physician.  Karie Soda, RN, CCM 04/30/2015 2:40 PM

## 2015-05-09 ENCOUNTER — Telehealth: Payer: Self-pay | Admitting: Cardiology

## 2015-05-09 NOTE — Telephone Encounter (Signed)
LMOVM requesting that pt send remote transmission b/c his home monitor has not updated in at least 8 days.

## 2015-05-27 ENCOUNTER — Telehealth: Payer: Self-pay | Admitting: Cardiology

## 2015-05-27 NOTE — Telephone Encounter (Signed)
LMOVM request that pt send remote transmission b/c his home monitor has not updated in at least 8 days.  

## 2015-06-07 ENCOUNTER — Telehealth: Payer: Self-pay

## 2015-06-07 NOTE — Telephone Encounter (Signed)
Call to patient.  He stated he thought he may return home by 06/17/2015.  ICM remote transmission rescheduled to 06/21/2015.  Received voice mail message from patient.  He reported he is still in [place] and unsure of return date.

## 2015-06-19 ENCOUNTER — Telehealth: Payer: Self-pay

## 2015-06-19 NOTE — Telephone Encounter (Signed)
Received voice mail message from patient.  He requested to move his remote transmission date to 07/01/2015 because he still currently in [place].   Date rescheduled to 3/20/207.

## 2015-06-21 ENCOUNTER — Encounter: Payer: Medicare HMO | Admitting: *Deleted

## 2015-07-01 ENCOUNTER — Ambulatory Visit (INDEPENDENT_AMBULATORY_CARE_PROVIDER_SITE_OTHER): Payer: Medicare HMO | Admitting: *Deleted

## 2015-07-01 DIAGNOSIS — Z95810 Presence of automatic (implantable) cardiac defibrillator: Secondary | ICD-10-CM

## 2015-07-01 DIAGNOSIS — I255 Ischemic cardiomyopathy: Secondary | ICD-10-CM

## 2015-07-01 NOTE — Progress Notes (Signed)
EPIC Encounter for ICM Monitoring  Patient Name: Dave Daniels ServiceFabio A Daniels is a 75 y.o. male Date: 07/01/2015 Primary Care Physican: Doreatha MartinVELAZQUEZ,GRETCHEN, MD Primary Cardiologist: Jens Somrenshaw Electrophysiologist: [name] Dry Weight: 170 lb   Bi-V Pacing 98%      In the past month, have you:  1. Gained more than 2 pounds in a day or more than 5 pounds in a week? no  2. Had changes in your medications (with verification of current medications)? no  3. Had more shortness of breath than is usual for you? no  4. Limited your activity because of shortness of breath? no  5. Not been able to sleep because of shortness of breath? no  6. Had increased swelling in your feet or ankles? no  7. Had symptoms of dehydration (dizziness, dry mouth, increased thirst, decreased urine output) no  8. Had changes in sodium restriction? no  9. Been compliant with medication? Yes   ICM trend: 3 month view for 07/01/2015   ICM trend: 1 year view for 07/01/2015  Follow-up plan: ICM clinic phone appointment on 08/01/2015.  Thoracic impedance below reference line from 06/23/2015 to 06/30/2015 suggesting fluid accumulation.  Patient denied any fluid symptoms.  Patient stated he has just returned from vacation and expected there may be a little fluid but he feels fine.  He is returning to his normal eating habits and thoracic impedance trending back to reference line today, 07/01/2015.   He will be scheduling a routine office visit with Dr Johney FrameAllred.  Advised if it is scheduled in April, will reschedule the remote transmission if needed.  Encouraged to call for any fluid symptoms.  No changes today.    Karie SodaLaurie S Short, RN, CCM 07/01/2015 3:47 PM

## 2015-07-02 NOTE — Progress Notes (Signed)
Remote ICD transmission.   

## 2015-07-22 ENCOUNTER — Telehealth: Payer: Self-pay | Admitting: Cardiology

## 2015-07-22 NOTE — Telephone Encounter (Signed)
Spoke w/ pt and requested that he send a manual transmission b/c his home monitor has not updated in at least 8 days.   

## 2015-08-01 ENCOUNTER — Encounter: Payer: Self-pay | Admitting: Internal Medicine

## 2015-08-01 ENCOUNTER — Ambulatory Visit (INDEPENDENT_AMBULATORY_CARE_PROVIDER_SITE_OTHER): Payer: Medicare HMO | Admitting: Internal Medicine

## 2015-08-01 VITALS — BP 138/72 | HR 63 | Ht 67.0 in | Wt 173.4 lb

## 2015-08-01 DIAGNOSIS — I519 Heart disease, unspecified: Secondary | ICD-10-CM | POA: Diagnosis not present

## 2015-08-01 LAB — CUP PACEART INCLINIC DEVICE CHECK
Brady Statistic RA Percent Paced: 42 %
Brady Statistic RV Percent Paced: 98 %
Date Time Interrogation Session: 20170420131425
HighPow Impedance: 67.5 Ohm
Implantable Lead Implant Date: 20121219
Implantable Lead Implant Date: 20121219
Implantable Lead Location: 753859
Lead Channel Pacing Threshold Amplitude: 0.5 V
Lead Channel Pacing Threshold Amplitude: 0.75 V
Lead Channel Pacing Threshold Pulse Width: 0.5 ms
Lead Channel Sensing Intrinsic Amplitude: 2.5 mV
Lead Channel Setting Pacing Amplitude: 2.5 V
Lead Channel Setting Pacing Pulse Width: 0.8 ms
Lead Channel Setting Sensing Sensitivity: 0.5 mV
MDC IDC LEAD IMPLANT DT: 20121219
MDC IDC LEAD LOCATION: 753858
MDC IDC LEAD LOCATION: 753860
MDC IDC MSMT BATTERY REMAINING LONGEVITY: 30
MDC IDC MSMT LEADCHNL LV IMPEDANCE VALUE: 450 Ohm
MDC IDC MSMT LEADCHNL LV PACING THRESHOLD AMPLITUDE: 1.5 V
MDC IDC MSMT LEADCHNL LV PACING THRESHOLD AMPLITUDE: 1.5 V
MDC IDC MSMT LEADCHNL LV PACING THRESHOLD PULSEWIDTH: 0.8 ms
MDC IDC MSMT LEADCHNL LV PACING THRESHOLD PULSEWIDTH: 0.8 ms
MDC IDC MSMT LEADCHNL RA IMPEDANCE VALUE: 425 Ohm
MDC IDC MSMT LEADCHNL RA PACING THRESHOLD AMPLITUDE: 0.5 V
MDC IDC MSMT LEADCHNL RA PACING THRESHOLD PULSEWIDTH: 0.5 ms
MDC IDC MSMT LEADCHNL RA PACING THRESHOLD PULSEWIDTH: 0.5 ms
MDC IDC MSMT LEADCHNL RV IMPEDANCE VALUE: 350 Ohm
MDC IDC MSMT LEADCHNL RV PACING THRESHOLD AMPLITUDE: 0.75 V
MDC IDC MSMT LEADCHNL RV PACING THRESHOLD PULSEWIDTH: 0.5 ms
MDC IDC SET LEADCHNL RA PACING AMPLITUDE: 2 V
MDC IDC SET LEADCHNL RV PACING AMPLITUDE: 2 V
MDC IDC SET LEADCHNL RV PACING PULSEWIDTH: 0.5 ms
Pulse Gen Serial Number: 7016547

## 2015-08-01 NOTE — Patient Instructions (Signed)
Medication Instructions:  Your physician recommends that you continue on your current medications as directed. Please refer to the Current Medication list given to you today.   Labwork: None ordered   Testing/Procedures: None ordered   Follow-Up: Your physician wants you to follow-up in: 12 months with Dr Johney FrameAllred Bonita QuinYou will receive a reminder letter in the mail two months in advance. If you don't receive a letter, please call our office to schedule the follow-up appointment.  Remote monitoring is used to monitor your  ICD from home. This monitoring reduces the number of office visits required to check your device to one time per year. It allows us to keep an eye on the functioning of your device to ensure it is working properly. You are scheduled for a device check from home on 10/31/15. You may send your transmission at any time that day. If you have a wireless device, the transmission will be sent automatically. After your physician reviews your transmission, you will receive a postcard with your next transmission date.    Any Other Special Instructions Will Be Listed Below (If Applicable).     If you need a refill on your cardiac medications before your next appointment, please call your pharmacy.

## 2015-08-01 NOTE — Progress Notes (Unsigned)
ICM transmission rescheduled from 08/01/2015 to 09/03/2015 due to patient has office appointment with Dr Johney FrameAllred today.

## 2015-08-01 NOTE — Progress Notes (Signed)
Electrophysiology Office Note   Date:  08/01/2015   ID:  Dave ServiceFabio A Daniels, DOB Dec 22, 1940, MRN [id number]  PCP:  Doreatha MartinVELAZQUEZ,GRETCHEN, Dave Daniels  Cardiologist:  Dr Jens Somrenshaw Primary Electrophysiologist: Dave RangeJames Benisha Daniels, Dave Daniels    Chief Complaint  Patient presents with  . Congestive Heart Failure     History of Present Illness: Dave Daniels is a 75 y.o. male who presents today for electrophysiology evaluation.   Doing well with NYHA Class II CHF symptoms.  He remains very active, without limitation.  Today, he denies symptoms of palpitations, chest pain, shortness of breath, orthopnea, PND, lower extremity edema, claudication, dizziness, presyncope, syncope, bleeding, or neurologic sequela. The patient is tolerating medications without difficulties and is otherwise without complaint today.    Past Medical History  Diagnosis Date  . Hypertension   . Hyperlipidemia   . Gout   . CAD (coronary artery disease)     [place] in [place], [place] [facility].  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  . Ischemic cardiomyopathy     EF 15% by echo 11/2010 and still 20-25% by follow-up echo 02/2011, s/p [employer] Jude Bi-V ICD implantation 04/01/11  . LBBB (left bundle branch block)   . Renal insufficiency     Cr 1.6 on 03/25/11   Past Surgical History  Procedure Laterality Date  . Coronary artery bypass graft  3/12    in [place]  . Tonsillectomy    . Umbilical hernia repair    . Inguinal hernia repair    . Carpel tunnel surgery    . Cardiac defibrillator placement  12/12    BiV ICD (SJM) implanted by Dr Johney FrameAllred  . Bi-ventricular implantable cardioverter defibrillator N/A 04/01/2011    Procedure: BI-VENTRICULAR IMPLANTABLE CARDIOVERTER DEFIBRILLATOR  (CRT-D);  Surgeon: Marinus MawGregg W Taylor, Dave Daniels;  [facility];  Service: Cardiovascular;  Laterality: N/A;     Current Outpatient Prescriptions  Medication Sig Dispense Refill  . allopurinol (ZYLOPRIM) 100 MG tablet Take 100 mg by  mouth daily.    Marland Kitchen. aspirin 81 MG tablet Take 81 mg by mouth daily.    . carvedilol (COREG) 12.5 MG tablet Take 1 tablet (12.5 mg total) by mouth 2 (two) times daily with a meal. 30 tablet 11  . colchicine 0.6 MG tablet Take 0.6 mg by mouth daily as needed. Gout    . furosemide (LASIX) 40 MG tablet Take one and one half tablets once daily 45 tablet 11  . losartan (COZAAR) 25 MG tablet Take 1 tablet by mouth daily.  3  . potassium chloride SA (K-DUR,KLOR-CON) 20 MEQ tablet Take 1 tablet (20 mEq total) by mouth daily. 30 tablet 11  . pravastatin (PRAVACHOL) 80 MG tablet Take 80 mg by mouth daily.     No current facility-administered medications for this visit.    Allergies:   Statins and Nsaids   Social History:  The patient  reports that he has never smoked. He has never used smokeless tobacco. He reports that he drinks alcohol. He reports that he does not use illicit drugs.   ROS:  Please see the history of present illness.   All other systems are reviewed and negative.    PHYSICAL EXAM: VS:  BP 138/72 mmHg  Pulse 63  Ht 5\' 7"  (1.702 m)  Wt 173 lb 6.4 oz (78.654 kg)  BMI 27.15 kg/m2 , BMI Body mass index is 27.15 kg/(m^2). GEN: Well nourished, well developed, in no acute distress HEENT: normal Neck:  no JVD, carotid bruits, or masses Cardiac: RRR; no murmurs, rubs, or gallops,no edema  Respiratory:  clear to auscultation bilaterally, normal work of breathing GI: soft, nontender, nondistended, + BS MS: no deformity or atrophy Skin: warm and dry, device pocket is well healed Neuro:  Strength and sensation are intact Psych: euthymic mood, full affect  EKG:  EKG is ordered today. The ekg ordered today shows sinus with BiV pacing  Device interrogation is reviewed today in detail.  See PaceArt for details.   Recent Labs: No results found for requested labs within last 365 days.    Lipid Panel     Component Value Date/Time   CHOL 153 07/13/2012 1434   TRIG 102 07/13/2012 1434     HDL 41 07/13/2012 1434   CHOLHDL 3.7 07/13/2012 1434   VLDL 20 07/13/2012 1434   LDLCALC 92 07/13/2012 1434     Wt Readings from Last 3 Encounters:  08/01/15 173 lb 6.4 oz (78.654 kg)  10/24/14 175 lb (79.379 kg)  07/23/14 177 lb 9.6 oz (80.559 kg)     ASSESSMENT AND PLAN:  1.  Chronic systolic dysfunction euvolemic today Normal BiV ICD function See Pace Art report No changes today  I have discussed SJM Fortify Assura advisary with the patient today. He understands that recommendation from SJM is to not replace the device at this time. The patient is device dependant.  The patient has not had appropriate device therapy in the past or implanted for secondary prevention.  Vibratory alert demonstrated today.  He is actively remotely monitored and understands the importance of compliance today.   Given that he is dependant, I have advised device replacement prophylactically.  He understands my concerns and will discuss with his spouse.  He says that currently, his wish is to not replace his device but to follow it conservatively.  Merlin I will see again in 1 year  Current medicines are reviewed at length with the patient today.   The patient does not have concerns regarding his medicines.  The following changes were made today:  none  Signed, Airica Schwartzkopf, Dave Daniels  08/01/2015 10:34 AM     [facility] [address] [place] [address] [phone] (office) [phone] (fax)  

## 2015-08-02 ENCOUNTER — Encounter: Payer: Self-pay | Admitting: Cardiology

## 2015-08-02 ENCOUNTER — Telehealth: Payer: Self-pay | Admitting: Internal Medicine

## 2015-08-02 DIAGNOSIS — I255 Ischemic cardiomyopathy: Secondary | ICD-10-CM

## 2015-08-02 LAB — CUP PACEART REMOTE DEVICE CHECK
Battery Remaining Longevity: 31 mo
Battery Voltage: 2.9 V
Brady Statistic AP VP Percent: 43 %
Brady Statistic RA Percent Paced: 42 %
HighPow Impedance: 74 Ohm
HighPow Impedance: 74 Ohm
Implantable Lead Implant Date: 20121219
Implantable Lead Location: 753860
Lead Channel Impedance Value: 460 Ohm
Lead Channel Pacing Threshold Amplitude: 0.625 V
Lead Channel Pacing Threshold Pulse Width: 0.5 ms
Lead Channel Sensing Intrinsic Amplitude: 12 mV
Lead Channel Sensing Intrinsic Amplitude: 2.1 mV
Lead Channel Setting Pacing Amplitude: 2 V
Lead Channel Setting Pacing Amplitude: 2.25 V
Lead Channel Setting Pacing Pulse Width: 0.8 ms
MDC IDC LEAD IMPLANT DT: 20121219
MDC IDC LEAD IMPLANT DT: 20121219
MDC IDC LEAD LOCATION: 753858
MDC IDC LEAD LOCATION: 753859
MDC IDC MSMT BATTERY REMAINING PERCENTAGE: 40 %
MDC IDC MSMT LEADCHNL RA IMPEDANCE VALUE: 440 Ohm
MDC IDC MSMT LEADCHNL RV IMPEDANCE VALUE: 350 Ohm
MDC IDC PG SERIAL: 7016547
MDC IDC SESS DTM: 20170320140050
MDC IDC SET LEADCHNL RV PACING AMPLITUDE: 2 V
MDC IDC SET LEADCHNL RV PACING PULSEWIDTH: 0.5 ms
MDC IDC SET LEADCHNL RV SENSING SENSITIVITY: 0.5 mV
MDC IDC STAT BRADY AP VS PERCENT: 1 %
MDC IDC STAT BRADY AS VP PERCENT: 56 %
MDC IDC STAT BRADY AS VS PERCENT: 1 %

## 2015-08-02 NOTE — Telephone Encounter (Signed)
New Message  Pt was seen yesterday 4/20- requested to speak w/ rN concerning possible procedure. Please call back and discuss.

## 2015-08-05 NOTE — Telephone Encounter (Signed)
Discussed with Kerry FortBrian Small [employer] rep.  SJM will cover his out of pocket expenses.  Bring any bill from [facility] to our attention.  Save the bills as SJM will need them if he gets them.   08/12/15 pre-op labs 10:00am procedure on 08/19/15 at 7:30

## 2015-08-08 NOTE — Telephone Encounter (Signed)
[facility] at 5:30[facility] Nothing to eat or drink after midnight the night before your procedure Do not take any medications the morning of the procedure

## 2015-08-12 ENCOUNTER — Telehealth: Payer: Self-pay

## 2015-08-12 ENCOUNTER — Other Ambulatory Visit (INDEPENDENT_AMBULATORY_CARE_PROVIDER_SITE_OTHER): Payer: Medicare HMO

## 2015-08-12 ENCOUNTER — Other Ambulatory Visit: Payer: Medicare HMO

## 2015-08-12 DIAGNOSIS — I255 Ischemic cardiomyopathy: Secondary | ICD-10-CM

## 2015-08-12 LAB — CBC WITH DIFFERENTIAL/PLATELET
BASOS PCT: 0 %
Basophils Absolute: 0 cells/uL (ref 0–200)
EOS ABS: 273 {cells}/uL (ref 15–500)
EOS PCT: 3 %
HCT: 42.3 % (ref 38.5–50.0)
Hemoglobin: 14.1 g/dL (ref 13.2–17.1)
LYMPHS PCT: 13 %
Lymphs Abs: 1183 cells/uL (ref 850–3900)
MCH: 28.5 pg (ref 27.0–33.0)
MCHC: 33.3 g/dL (ref 32.0–36.0)
MCV: 85.5 fL (ref 80.0–100.0)
MONOS PCT: 5 %
MPV: 10.9 fL (ref 7.5–12.5)
Monocytes Absolute: 455 cells/uL (ref 200–950)
NEUTROS ABS: 7189 {cells}/uL (ref 1500–7800)
Neutrophils Relative %: 79 %
PLATELETS: 219 10*3/uL (ref 140–400)
RBC: 4.95 MIL/uL (ref 4.20–5.80)
RDW: 14.1 % (ref 11.0–15.0)
WBC: 9.1 10*3/uL (ref 3.8–10.8)

## 2015-08-12 LAB — BASIC METABOLIC PANEL
BUN: 25 mg/dL (ref 7–25)
CALCIUM: 9.7 mg/dL (ref 8.6–10.3)
CHLORIDE: 105 mmol/L (ref 98–110)
CO2: 27 mmol/L (ref 20–31)
CREATININE: 1.43 mg/dL — AB (ref 0.70–1.18)
Glucose, Bld: 93 mg/dL (ref 65–99)
Potassium: 4.1 mmol/L (ref 3.5–5.3)
Sodium: 143 mmol/L (ref 135–146)

## 2015-08-12 NOTE — Telephone Encounter (Signed)
Call back to patient as requested.  He asked if he could have blood drawn at 12 today instead of 10:00am.  Advised anytime today will be fine.

## 2015-08-15 ENCOUNTER — Telehealth: Payer: Self-pay | Admitting: Internal Medicine

## 2015-08-15 ENCOUNTER — Telehealth: Payer: Self-pay

## 2015-08-15 NOTE — Telephone Encounter (Signed)
Left message on machine for patient with lab results and below info:   [facility] at 5:30am Nothing to eat or drink after midnight the night before your procedure Do not take any medications the morning of the procedure

## 2015-08-15 NOTE — Telephone Encounter (Signed)
Received call from patient.  He is requesting call back regarding instructions about the procedure that Dr Johney FrameAllred is doing on Monday, 08/19/2015.  Advised Dr Jenel LucksAllred's nurse, Tresa EndoKelly will contact him regarding instructions.

## 2015-08-15 NOTE — Telephone Encounter (Signed)
No need for note

## 2015-08-19 ENCOUNTER — Encounter (HOSPITAL_COMMUNITY)
Admission: RE | Disposition: A | Discharge status: Home / Self Care | Payer: Self-pay | Source: Ambulatory Visit | Attending: Internal Medicine

## 2015-08-19 ENCOUNTER — Ambulatory Visit (HOSPITAL_COMMUNITY)
Admission: RE | Admit: 2015-08-19 | Discharge: 2015-08-19 | Disposition: A | Discharge status: Home / Self Care | Payer: Medicare HMO | Source: Ambulatory Visit | Attending: Internal Medicine | Admitting: Internal Medicine

## 2015-08-19 ENCOUNTER — Other Ambulatory Visit: Payer: Self-pay | Admitting: Nurse Practitioner

## 2015-08-19 DIAGNOSIS — Z4502 Encounter for adjustment and management of automatic implantable cardiac defibrillator: Secondary | ICD-10-CM | POA: Diagnosis not present

## 2015-08-19 DIAGNOSIS — I2510 Atherosclerotic heart disease of native coronary artery without angina pectoris: Secondary | ICD-10-CM | POA: Insufficient documentation

## 2015-08-19 DIAGNOSIS — I5022 Chronic systolic (congestive) heart failure: Secondary | ICD-10-CM | POA: Diagnosis not present

## 2015-08-19 DIAGNOSIS — E785 Hyperlipidemia, unspecified: Secondary | ICD-10-CM | POA: Insufficient documentation

## 2015-08-19 DIAGNOSIS — I110 Hypertensive heart disease with heart failure: Secondary | ICD-10-CM | POA: Diagnosis not present

## 2015-08-19 DIAGNOSIS — N289 Disorder of kidney and ureter, unspecified: Secondary | ICD-10-CM | POA: Insufficient documentation

## 2015-08-19 DIAGNOSIS — Z7982 Long term (current) use of aspirin: Secondary | ICD-10-CM | POA: Diagnosis not present

## 2015-08-19 DIAGNOSIS — I255 Ischemic cardiomyopathy: Secondary | ICD-10-CM | POA: Diagnosis not present

## 2015-08-19 DIAGNOSIS — M109 Gout, unspecified: Secondary | ICD-10-CM | POA: Diagnosis not present

## 2015-08-19 DIAGNOSIS — I447 Left bundle-branch block, unspecified: Secondary | ICD-10-CM | POA: Insufficient documentation

## 2015-08-19 DIAGNOSIS — I252 Old myocardial infarction: Secondary | ICD-10-CM | POA: Insufficient documentation

## 2015-08-19 DIAGNOSIS — I442 Atrioventricular block, complete: Secondary | ICD-10-CM | POA: Diagnosis not present

## 2015-08-19 DIAGNOSIS — I519 Heart disease, unspecified: Secondary | ICD-10-CM | POA: Diagnosis present

## 2015-08-19 DIAGNOSIS — Z951 Presence of aortocoronary bypass graft: Secondary | ICD-10-CM | POA: Diagnosis not present

## 2015-08-19 HISTORY — PX: EP IMPLANTABLE DEVICE: SHX172B

## 2015-08-19 LAB — SURGICAL PCR SCREEN
MRSA, PCR: NEGATIVE
STAPHYLOCOCCUS AUREUS: NEGATIVE

## 2015-08-19 SURGERY — ICD/BIV ICD GENERATOR CHANGEOUT

## 2015-08-19 MED ORDER — CEFAZOLIN SODIUM-DEXTROSE 2-4 GM/100ML-% IV SOLN
2.0000 g | INTRAVENOUS | Status: AC
  Administered 2015-08-19: 2 g via INTRAVENOUS

## 2015-08-19 MED ORDER — SODIUM CHLORIDE 0.9 % IV SOLN
INTRAVENOUS | Status: DC
  Administered 2015-08-19: 12:00:00 via INTRAVENOUS

## 2015-08-19 MED ORDER — SODIUM CHLORIDE 0.9 % IV SOLN: 250.0000 mL | INTRAVENOUS | Status: DC | PRN

## 2015-08-19 MED ORDER — SODIUM CHLORIDE 0.9% FLUSH: 3.0000 mL | INTRAVENOUS | Status: DC | PRN

## 2015-08-19 MED ORDER — LIDOCAINE HCL (PF) 1 % IJ SOLN
INTRAMUSCULAR | Status: AC
  Filled 2015-08-19: qty 60

## 2015-08-19 MED ORDER — SODIUM CHLORIDE 0.9% FLUSH: 3.0000 mL | Freq: Two times a day (BID) | INTRAVENOUS | Status: DC

## 2015-08-19 MED ORDER — CEFAZOLIN SODIUM-DEXTROSE 2-4 GM/100ML-% IV SOLN
INTRAVENOUS | Status: AC
  Filled 2015-08-19: qty 100

## 2015-08-19 MED ORDER — SODIUM CHLORIDE 0.9 % IR SOLN
Status: AC
  Filled 2015-08-19: qty 2

## 2015-08-19 MED ORDER — GENTAMICIN SULFATE 40 MG/ML IJ SOLN
80.0000 mg | INTRAMUSCULAR | Status: AC
  Administered 2015-08-19: 80 mg

## 2015-08-19 MED ORDER — CHLORHEXIDINE GLUCONATE 4 % EX LIQD: 60.0000 mL | Freq: Once | CUTANEOUS | Status: DC

## 2015-08-19 MED ORDER — ACETAMINOPHEN 325 MG PO TABS
325.0000 mg | ORAL_TABLET | ORAL | Status: DC | PRN
Start: 2015-08-19 — End: 2015-08-19
  Filled 2015-08-19: qty 2

## 2015-08-19 MED ORDER — MUPIROCIN 2 % EX OINT
1.0000 "application " | TOPICAL_OINTMENT | Freq: Once | CUTANEOUS | Status: AC
  Administered 2015-08-19: 1 via TOPICAL

## 2015-08-19 MED ORDER — HEPARIN (PORCINE) IN NACL 2-0.9 UNIT/ML-% IJ SOLN
INTRAMUSCULAR | Status: AC
  Filled 2015-08-19: qty 1000

## 2015-08-19 MED ORDER — ONDANSETRON HCL 4 MG/2ML IJ SOLN: 4.0000 mg | Freq: Four times a day (QID) | INTRAMUSCULAR | Status: DC | PRN

## 2015-08-19 MED ORDER — MUPIROCIN 2 % EX OINT
TOPICAL_OINTMENT | CUTANEOUS | Status: AC
  Administered 2015-08-19: 1 via TOPICAL
  Filled 2015-08-19: qty 22

## 2015-08-19 MED ORDER — LIDOCAINE HCL (PF) 1 % IJ SOLN
INTRAMUSCULAR | Status: DC | PRN
  Administered 2015-08-19: 32 mL via INTRADERMAL

## 2015-08-19 SURGICAL SUPPLY — 5 items
ASSURA CRTD CD3369-40Q (ICD Generator) ×2 IMPLANT
CABLE SURGICAL S-101-97-12 (CABLE) ×2 IMPLANT
DEFIB ASSURA MULTI-CHMBR CRT-D (ICD Generator) ×1 IMPLANT
PAD DEFIB LIFELINK (PAD) ×2 IMPLANT
TRAY PACEMAKER INSERTION (PACKS) ×2 IMPLANT

## 2015-08-19 NOTE — Interval H&P Note (Signed)
History and Physical Interval Note:  08/19/2015 11:52 AM  After long discussion with his spouse, the patient has decided to have his ICD prophylactically replaced.  He is worried that his device could abruptly fail and as he has recently been found to have complete heart block without escape that this could be catestrophic.  I have spoken with Ernestene KielNeville Bilbry and Bonna GainsBryan Small with [employer] and they assure me that device replacement would be covered by full warranty for this patient.    Risks, benefits, and alternatives to BIV ICD pulse generator replacement were discussed in detail today.  The patient understands that risks include but are not limited to bleeding, infection, pneumothorax, perforation, tamponade, vascular damage, renal failure, MI, stroke, death, inappropriate shocks, damage to his existing leads, and lead dislodgement and wishes to proceed.     Dave Daniels  has presented today for surgery, with the diagnosis of recall on battery  The various methods of treatment have been discussed with the patient and family. After consideration of risks, benefits and other options for treatment, the patient has consented to  Procedure(s): BIV ICD Generator Changeout (N/A) as a surgical intervention .  The patient's history has been reviewed, patient examined, no change in status, stable for surgery.  I have reviewed the patient's chart and labs.  Questions were answered to the patient's satisfaction.    ICD Criteria  Current LVEF:25%. Within 12 months prior to implant: No   Heart failure history: Yes, Class II  Cardiomyopathy history: Yes, Ischemic Cardiomyopathy.  Atrial Fibrillation/Atrial Flutter: No.  Ventricular tachycardia history: No.  Cardiac arrest history: No.  History of syndromes with risk of sudden death: No.  Previous ICD: Yes, Reason for ICD:  Primary prevention.  Current ICD indication: Primary  PPM indication: Yes. Pacing type: Ventricular. Greater than 40% RV  pacing requirement anticipated. Indication: Complete Heart Block   Class I or II Bradycardia indication present: Yes  Beta Blocker therapy for 3 or more months: Yes, prescribed.   Ace Inhibitor/ARB therapy for 3 or more months: Yes, prescribed.       Dave Daniels

## 2015-08-19 NOTE — H&P (View-Only) (Signed)
Electrophysiology Office Note   Date:  08/01/2015   ID:  Dave ServiceFabio A Danser, DOB 1940/10/23, MRN [id number]  PCP:  Doreatha MartinVELAZQUEZ,GRETCHEN, MD  Cardiologist:  Dr Jens Somrenshaw Primary Electrophysiologist: Hillis RangeJames Weslynn Ke, MD    Chief Complaint  Patient presents with  . Congestive Heart Failure     History of Present Illness: Dave Daniels is a 75 y.o. male who presents today for electrophysiology evaluation.   Doing well with NYHA Class II CHF symptoms.  He remains very active, without limitation.  Today, he denies symptoms of palpitations, chest pain, shortness of breath, orthopnea, PND, lower extremity edema, claudication, dizziness, presyncope, syncope, bleeding, or neurologic sequela. The patient is tolerating medications without difficulties and is otherwise without complaint today.    Past Medical History  Diagnosis Date  . Hypertension   . Hyperlipidemia   . Gout   . CAD (coronary artery disease)     [place] in 2010, [place] [facility] [facility].  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  . Ischemic cardiomyopathy     EF 15% by echo 11/2010 and still 20-25% by follow-up echo 02/2011, s/p [employer] Jude Bi-V ICD implantation 04/01/11  . LBBB (left bundle branch block)   . Renal insufficiency     Cr 1.6 on 03/25/11   Past Surgical History  Procedure Laterality Date  . Coronary artery bypass graft  3/12    in [place]  . Tonsillectomy    . Umbilical hernia repair    . Inguinal hernia repair    . Carpel tunnel surgery    . Cardiac defibrillator placement  12/12    BiV ICD (SJM) implanted by Dr Johney FrameAllred  . Bi-ventricular implantable cardioverter defibrillator N/A 04/01/2011    Procedure: BI-VENTRICULAR IMPLANTABLE CARDIOVERTER DEFIBRILLATOR  (CRT-D);  Surgeon: Marinus MawGregg W Taylor, MD;  [facility];  Service: Cardiovascular;  Laterality: N/A;     Current Outpatient Prescriptions  Medication Sig Dispense Refill  . allopurinol (ZYLOPRIM) 100 MG tablet Take 100 mg by  mouth daily.    Marland Kitchen. aspirin 81 MG tablet Take 81 mg by mouth daily.    . carvedilol (COREG) 12.5 MG tablet Take 1 tablet (12.5 mg total) by mouth 2 (two) times daily with a meal. 30 tablet 11  . colchicine 0.6 MG tablet Take 0.6 mg by mouth daily as needed. Gout    . furosemide (LASIX) 40 MG tablet Take one and one half tablets once daily 45 tablet 11  . losartan (COZAAR) 25 MG tablet Take 1 tablet by mouth daily.  3  . potassium chloride SA (K-DUR,KLOR-CON) 20 MEQ tablet Take 1 tablet (20 mEq total) by mouth daily. 30 tablet 11  . pravastatin (PRAVACHOL) 80 MG tablet Take 80 mg by mouth daily.     No current facility-administered medications for this visit.    Allergies:   Statins and Nsaids   Social History:  The patient  reports that he has never smoked. He has never used smokeless tobacco. He reports that he drinks alcohol. He reports that he does not use illicit drugs.   ROS:  Please see the history of present illness.   All other systems are reviewed and negative.    PHYSICAL EXAM: VS:  BP 138/72 mmHg  Pulse 63  Ht 5\' 7"  (1.702 m)  Wt 173 lb 6.4 oz (78.654 kg)  BMI 27.15 kg/m2 , BMI Body mass index is 27.15 kg/(m^2). GEN: Well nourished, well developed, in no acute distress HEENT: normal Neck:  no JVD, carotid bruits, or masses Cardiac: RRR; no murmurs, rubs, or gallops,no edema  Respiratory:  clear to auscultation bilaterally, normal work of breathing GI: soft, nontender, nondistended, + BS MS: no deformity or atrophy Skin: warm and dry, device pocket is well healed Neuro:  Strength and sensation are intact Psych: euthymic mood, full affect  EKG:  EKG is ordered today. The ekg ordered today shows sinus with BiV pacing  Device interrogation is reviewed today in detail.  See PaceArt for details.   Recent Labs: No results found for requested labs within last 365 days.    Lipid Panel     Component Value Date/Time   CHOL 153 07/13/2012 1434   TRIG 102 07/13/2012 1434     HDL 41 07/13/2012 1434   CHOLHDL 3.7 07/13/2012 1434   VLDL 20 07/13/2012 1434   LDLCALC 92 07/13/2012 1434     Wt Readings from Last 3 Encounters:  08/01/15 173 lb 6.4 oz (78.654 kg)  10/24/14 175 lb (79.379 kg)  07/23/14 177 lb 9.6 oz (80.559 kg)     ASSESSMENT AND PLAN:  1.  Chronic systolic dysfunction euvolemic today Normal BiV ICD function See Pace Art report No changes today  I have discussed SJM Fortify Assura advisary with the patient today. He understands that recommendation from SJM is to not replace the device at this time. The patient is device dependant.  The patient has not had appropriate device therapy in the past or implanted for secondary prevention.  Vibratory alert demonstrated today.  He is actively remotely monitored and understands the importance of compliance today.   Given that he is dependant, I have advised device replacement prophylactically.  He understands my concerns and will discuss with his spouse.  He says that currently, his wish is to not replace his device but to follow it conservatively.  Merlin I will see again in 1 year  Current medicines are reviewed at length with the patient today.   The patient does not have concerns regarding his medicines.  The following changes were made today:  none  Signed, Hillis Range, MD  08/01/2015 10:34 AM     [facility] [address] [place] [address] [phone] (office) [phone] (fax)

## 2015-08-19 NOTE — Discharge Instructions (Signed)
WOUND CARE INSTRUCTIONS  Keep incision clean and dry for 10 days. No driving for 2 days.  You can remove outer dressing tomorrow. Leave steri-strips (little pieces of tape) on until seen in the office for wound check appointment. Call the office [phone]) for redness, drainage, swelling, or fever.   Pacemaker Battery Change, Care After Refer to this sheet in the next few weeks. These instructions provide you with information on caring for yourself after your procedure. Your health care provider may also give you more specific instructions. Your treatment has been planned according to current medical practices, but problems sometimes occur. Call your health care provider if you have any problems or questions after your procedure. WHAT TO EXPECT AFTER THE PROCEDURE After your procedure, it is typical to have the following sensations:  Soreness at the pacemaker site. HOME CARE INSTRUCTIONS   Keep the incision clean and dry.  Unless advised otherwise, you may shower beginning 48 hours after your procedure.  For the first week after the replacement, avoid stretching motions that pull at the incision site, and avoid heavy exercise with the arm that is on the same side as the incision.  Take medicines only as directed by your health care provider.  Keep all follow-up visits as directed by your health care provider. SEEK MEDICAL CARE IF:   You have pain at the incision site that is not relieved by over-the-counter or prescription medicine.  There is drainage or pus from the incision site.  There is swelling larger than a lime at the incision site.  You develop red streaking that extends above or below the incision site.  You feel brief, intermittent palpitations, light-headedness, or any symptoms that you feel might be related to your heart. SEEK IMMEDIATE MEDICAL CARE IF:   You experience chest pain that is different than the pain at the pacemaker site.  You experience shortness of  breath.  You have palpitations or irregular heartbeat.  You have light-headedness that does not go away quickly.  You faint.  You have pain that gets worse and is not relieved by medicine.   This information is not intended to replace advice given to you by your health care provider. Make sure you discuss any questions you have with your health care provider.   Document Released: 01/18/2013 Document Revised: 04/20/2014 Document Reviewed: 01/18/2013 Elsevier Interactive Patient Education [employer]

## 2015-08-20 ENCOUNTER — Encounter (HOSPITAL_COMMUNITY): Payer: Self-pay | Admitting: Internal Medicine

## 2015-08-20 MED FILL — Gentamicin Sulfate Inj 40 MG/ML: INTRAMUSCULAR | Qty: 2 | Status: AC

## 2015-08-20 MED FILL — Heparin Sodium (Porcine) 2 Unit/ML in Sodium Chloride 0.9%: INTRAMUSCULAR | Qty: 500 | Status: AC

## 2015-08-20 MED FILL — Sodium Chloride Irrigation Soln 0.9%: Qty: 500 | Status: AC

## 2015-08-29 ENCOUNTER — Ambulatory Visit (INDEPENDENT_AMBULATORY_CARE_PROVIDER_SITE_OTHER): Payer: Medicare HMO | Admitting: *Deleted

## 2015-08-29 ENCOUNTER — Encounter: Payer: Self-pay | Admitting: Internal Medicine

## 2015-08-29 DIAGNOSIS — I255 Ischemic cardiomyopathy: Secondary | ICD-10-CM

## 2015-08-29 LAB — CUP PACEART INCLINIC DEVICE CHECK
Battery Remaining Longevity: 72 mo
Brady Statistic RA Percent Paced: 62 %
Brady Statistic RV Percent Paced: 99 %
Date Time Interrogation Session: 20170518103754
HIGH POWER IMPEDANCE MEASURED VALUE: 64.125
Implantable Lead Implant Date: 20121219
Implantable Lead Location: 753859
Lead Channel Pacing Threshold Amplitude: 0.75 V
Lead Channel Pacing Threshold Amplitude: 0.75 V
Lead Channel Pacing Threshold Amplitude: 1.5 V
Lead Channel Pacing Threshold Pulse Width: 0.5 ms
Lead Channel Pacing Threshold Pulse Width: 0.5 ms
Lead Channel Pacing Threshold Pulse Width: 0.8 ms
Lead Channel Sensing Intrinsic Amplitude: 3.9 mV
Lead Channel Setting Pacing Amplitude: 1.625
Lead Channel Setting Pacing Amplitude: 2 V
Lead Channel Setting Pacing Amplitude: 2.5 V
Lead Channel Setting Pacing Pulse Width: 0.8 ms
MDC IDC LEAD IMPLANT DT: 20121219
MDC IDC LEAD IMPLANT DT: 20121219
MDC IDC LEAD LOCATION: 753858
MDC IDC LEAD LOCATION: 753860
MDC IDC MSMT LEADCHNL LV IMPEDANCE VALUE: 437.5 Ohm
MDC IDC MSMT LEADCHNL LV PACING THRESHOLD AMPLITUDE: 1.5 V
MDC IDC MSMT LEADCHNL LV PACING THRESHOLD PULSEWIDTH: 0.8 ms
MDC IDC MSMT LEADCHNL RA IMPEDANCE VALUE: 437.5 Ohm
MDC IDC MSMT LEADCHNL RA PACING THRESHOLD AMPLITUDE: 0.75 V
MDC IDC MSMT LEADCHNL RA PACING THRESHOLD AMPLITUDE: 0.75 V
MDC IDC MSMT LEADCHNL RA PACING THRESHOLD PULSEWIDTH: 0.5 ms
MDC IDC MSMT LEADCHNL RV IMPEDANCE VALUE: 362.5 Ohm
MDC IDC MSMT LEADCHNL RV PACING THRESHOLD PULSEWIDTH: 0.5 ms
MDC IDC SET LEADCHNL RV PACING PULSEWIDTH: 0.5 ms
MDC IDC SET LEADCHNL RV SENSING SENSITIVITY: 2 mV
Pulse Gen Serial Number: 7306740

## 2015-08-29 NOTE — Progress Notes (Signed)
Spoke with patient during would check visit in device clinic.  Gen change out was 08/19/2015.  Next ICM remote transmission scheduled for 10/30/2015 and patient aware.

## 2015-08-29 NOTE — Progress Notes (Signed)
CRT-D Wound check appointment. Steri-strips removed. Wound without redness or edema. Incision edges approximated, wound well healed. Normal device function. Thresholds, sensing, and impedances consistent with implant measurements. Pt Bi-V pacing 99%. Device programmed with auto capture on for extra safety margin. Histogram distribution appropriate for patient and level of activity. No mode switches or ventricular arrhythmias noted. Patient educated about wound care, arm mobility, lifting restrictions, shock plan. ROV with AS 09/2015. JA 11/2015.

## 2015-09-04 ENCOUNTER — Other Ambulatory Visit: Payer: Self-pay

## 2015-09-04 ENCOUNTER — Ambulatory Visit (HOSPITAL_COMMUNITY): Payer: Medicare HMO | Attending: Cardiovascular Disease

## 2015-09-04 DIAGNOSIS — I2510 Atherosclerotic heart disease of native coronary artery without angina pectoris: Secondary | ICD-10-CM | POA: Diagnosis not present

## 2015-09-04 DIAGNOSIS — I5022 Chronic systolic (congestive) heart failure: Secondary | ICD-10-CM | POA: Diagnosis not present

## 2015-09-04 DIAGNOSIS — R29898 Other symptoms and signs involving the musculoskeletal system: Secondary | ICD-10-CM | POA: Diagnosis not present

## 2015-09-04 DIAGNOSIS — I110 Hypertensive heart disease with heart failure: Secondary | ICD-10-CM | POA: Insufficient documentation

## 2015-09-04 DIAGNOSIS — I071 Rheumatic tricuspid insufficiency: Secondary | ICD-10-CM | POA: Diagnosis not present

## 2015-09-04 DIAGNOSIS — I351 Nonrheumatic aortic (valve) insufficiency: Secondary | ICD-10-CM | POA: Diagnosis not present

## 2015-09-04 DIAGNOSIS — I509 Heart failure, unspecified: Secondary | ICD-10-CM | POA: Diagnosis present

## 2015-09-05 ENCOUNTER — Telehealth: Payer: Self-pay | Admitting: *Deleted

## 2015-09-05 NOTE — Telephone Encounter (Signed)
-----   Message from Marily LenteAmber K Seiler, NP sent at 09/04/2015  6:13 PM EDT ----- Please notify patient of echo results.  EF remains unchanged. We will make programming changes to his device at office visit with me in June.

## 2015-10-09 NOTE — Progress Notes (Signed)
Electrophysiology Office Note Date: 10/10/2015  ID:  Dave Daniels, DOB Aug 11, 1940, MRN [id number]  PCP: Dave MartinVELAZQUEZ,GRETCHEN, MD Primary Cardiologist: Dave Daniels Electrophysiologist: Dave Daniels  CC: 6 week CRT follow up  Dave ServiceFabio A Daniels is a 75 y.o. male seen today for Dr Dave FrameAllred.  He presents today for routine electrophysiology followup.  Since last being seen in our clinic, the patient reports doing very well. He denies chest pain, palpitations, dyspnea, PND, orthopnea, nausea, vomiting, dizziness, syncope, edema, weight gain, or early satiety.  He has not had ICD shocks.   Echo 08/2015 demonstrated EF 25-30%, diffuse hypokinesis, grade 2 diastolic dysfunction, mild to moderate AR.  Device History: STJ CRTD implanted 2012 for ICM, CHF; gen change 2017 History of appropriate therapy: No History of AAD therapy: No   Past Medical History  Diagnosis Date  . Hypertension   . Hyperlipidemia   . Gout   . CAD (coronary artery disease)     [place] in [place], [place] [facility].  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  . Ischemic cardiomyopathy     EF 15% by echo 11/2010 and still 20-25% by follow-up echo 02/2011, s/p St. Jude Bi-V ICD implantation 04/01/11  . LBBB (left bundle branch block)   . Renal insufficiency     Cr 1.6 on 03/25/11  . Complete heart block [facility](HCC)    Past Surgical History  Procedure Laterality Date  . Coronary artery bypass graft  3/12    [place]  . Tonsillectomy    . Umbilical hernia repair    . Inguinal hernia repair    . Carpel tunnel surgery    . Cardiac defibrillator placement  12/12    BiV ICD (SJM) implanted by Dr Dave FrameAllred  . Bi-ventricular implantable cardioverter defibrillator N/A 04/01/2011    Procedure: BI-VENTRICULAR IMPLANTABLE CARDIOVERTER DEFIBRILLATOR  (CRT-D);  Surgeon: Dave MawGregg W Taylor, MD;  [facility];  Service: Cardiovascular;  Laterality: N/A;  . Ep implantable device N/A 08/19/2015    Procedure: BIV ICD  Generator Changeout;  Surgeon: Hillis RangeJames [name], MD;  [facility];  Service: Cardiovascular;  Laterality: N/A;    Current Outpatient Prescriptions  Medication Sig Dispense Refill  . Ascorbic Acid (VITAMIN C) 1000 MG tablet Take 1,000 mg by mouth daily.    Marland Kitchen. aspirin 81 MG tablet Take 81 mg by mouth every other day.     . carvedilol (COREG) 12.5 MG tablet Take 1 tablet (12.5 mg total) by mouth 2 (two) times daily with a meal. 30 tablet 11  . colchicine 0.6 MG tablet Take 0.6 mg by mouth daily as needed. Gout    . furosemide (LASIX) 40 MG tablet Take 40 mg by mouth daily. Pt takes 60 mg daily by  mouth    . losartan (COZAAR) 25 MG tablet Take 25 mg by mouth daily.   3  . potassium chloride SA (K-DUR,KLOR-CON) 20 MEQ tablet Take 1 tablet (20 mEq total) by mouth daily. 30 tablet 11  . pravastatin (PRAVACHOL) 80 MG tablet Take 80 mg by mouth every other day.     . Saw Palmetto, Serenoa repens, (SAW PALMETTO PO) Take 1 tablet by mouth daily.     No current facility-administered medications for this visit.    Allergies:   Statins and Nsaids   Social History: Social History   Social History  . Marital Status: Married    Spouse Name: N/A  . Number of Children: 4  . Years  of Education: N/A   Occupational History  .      Retired   Social History Main Topics  . Smoking status: Never Smoker   . Smokeless tobacco: Never Used  . Alcohol Use: Yes     Comment: occasional  . Drug Use: No  . Sexual Activity: Yes   Other Topics Concern  . Not on file   Social History Narrative   [place], [place] from [place].  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    Family History: Family History  Problem Relation Age of Onset  . Pancreatic cancer Mother     Review of Systems: All other systems reviewed and are otherwise negative except as noted above.   Physical Exam: VS:  BP 118/68 mmHg  Pulse 60  Ht 5\' 7"  (1.702 m)  Wt 170 lb 6.4 oz (77.293 kg)  BMI 26.68 kg/m2 , BMI  Body mass index is 26.68 kg/(m^2).  GEN- The patient is well appearing, alert and oriented x 3 today.   HEENT: normocephalic, atraumatic; sclera clear, conjunctiva pink; hearing intact; oropharynx clear; neck supple  Lungs- Clear to ausculation bilaterally, normal work of breathing.  No wheezes, rales, rhonchi Heart- Regular rate and rhythm (paced) GI- soft, non-tender, non-distended, bowel sounds present  Extremities- no clubbing, cyanosis, or edema; DP/PT/radial pulses 2+ bilaterally MS- no significant deformity or atrophy Skin- warm and dry, no rash or lesion; ICD pocket well healed Psych- euthymic mood, full affect Neuro- strength and sensation are intact  ICD interrogation- reviewed in detail today,  See PACEART report  EKG:  EKG is ordered today. The ekg ordered today shows AV pacing  Recent Labs: 08/12/2015: BUN 25; Creat 1.43*; Hemoglobin 14.1; Platelets 219; Potassium 4.1; Sodium 143   Wt Readings from Last 3 Encounters:  10/10/15 170 lb 6.4 oz (77.293 kg)  08/01/15 173 lb 6.4 oz (78.654 kg)  10/24/14 175 lb (79.379 kg)     Other studies Reviewed: Additional studies/ records that were reviewed today include: Dr Jenel LucksAllred's notes, echo   Assessment and Plan:  1.  Chronic systolic dysfunction euvolemic today Stable on an appropriate medical regimen Normal ICD function See Pace Art report Repeat echo shows no improvement in EF with CRT.  Today, MPP turned on - final programming MPP on, LV1 - LV4-RV coil, LV1 delay 30msec, LV2 - LV1-RVcoil, LV2 delay 5msec  Follow up echo 6 months ICM clinic   2.  CAD No recent ischemic symptoms  3.  HTN Stable No change required today   Current medicines are reviewed at length with the patient today.   The patient does not have concerns regarding his medicines.  The following changes were made today:  none  Labs/ tests ordered today include: echo 6 months  Orders Placed This Encounter  Procedures  . Implantable device check  .  ECHO COMPLETE     Disposition:   Follow up with Dr Dave FrameAllred and Dave Daniels as scheduled   Signed, Gypsy BalsamAmber Seiler, NP 10/10/2015 9:51 AM  [facility] [address] [place] [address] [phone] (office) [phone] (fax)

## 2015-10-10 ENCOUNTER — Encounter: Payer: Self-pay | Admitting: Nurse Practitioner

## 2015-10-10 ENCOUNTER — Ambulatory Visit (INDEPENDENT_AMBULATORY_CARE_PROVIDER_SITE_OTHER): Payer: Medicare HMO | Admitting: Nurse Practitioner

## 2015-10-10 ENCOUNTER — Encounter: Payer: Self-pay | Admitting: Internal Medicine

## 2015-10-10 VITALS — BP 118/68 | HR 60 | Ht 67.0 in | Wt 170.4 lb

## 2015-10-10 DIAGNOSIS — I5022 Chronic systolic (congestive) heart failure: Secondary | ICD-10-CM | POA: Diagnosis not present

## 2015-10-10 DIAGNOSIS — I351 Nonrheumatic aortic (valve) insufficiency: Secondary | ICD-10-CM

## 2015-10-10 DIAGNOSIS — I10 Essential (primary) hypertension: Secondary | ICD-10-CM

## 2015-10-10 LAB — CUP PACEART INCLINIC DEVICE CHECK
Implantable Lead Implant Date: 20121219
Implantable Lead Location: 753858
MDC IDC LEAD IMPLANT DT: 20121219
MDC IDC LEAD IMPLANT DT: 20121219
MDC IDC LEAD LOCATION: 753859
MDC IDC LEAD LOCATION: 753860
MDC IDC SESS DTM: 20170629092817
Pulse Gen Serial Number: 7306740

## 2015-10-10 NOTE — Patient Instructions (Addendum)
Medication Instructions:   Your physician recommends that you continue on your current medications as directed. Please refer to the Current Medication list given to you today.   If you need a refill on your cardiac medications before your next appointment, please call your pharmacy.  Labwork: NONE ORDER TODAY    Testing/Procedures: IN 6 MONTH SYour physician has requested that you have an echocardiogram. Echocardiography is a painless test that uses sound waves to create images of your heart. It provides your doctor with information about the size and shape of your heart and how well your heart's chambers and valves are working. This procedure takes approximately one hour. There are no restrictions for this procedure.       Follow-Up:.Marland Kitchen.KEEP APPT WITH DR Johney FrameALLRED   FOLLOW UP WITH CRENSHAW IN December..  Any Other Special Instructions Will Be Listed Below (If Applicable).

## 2015-10-11 ENCOUNTER — Encounter: Payer: Self-pay | Admitting: Internal Medicine

## 2015-10-11 ENCOUNTER — Ambulatory Visit (INDEPENDENT_AMBULATORY_CARE_PROVIDER_SITE_OTHER): Payer: Medicare HMO | Admitting: *Deleted

## 2015-10-11 ENCOUNTER — Telehealth: Payer: Self-pay

## 2015-10-11 DIAGNOSIS — Z95810 Presence of automatic (implantable) cardiac defibrillator: Secondary | ICD-10-CM

## 2015-10-11 LAB — CUP PACEART INCLINIC DEVICE CHECK
Battery Remaining Longevity: 45.6
Brady Statistic RA Percent Paced: 22 %
Brady Statistic RV Percent Paced: 99.92 %
Date Time Interrogation Session: 20170630152020
HIGH POWER IMPEDANCE MEASURED VALUE: 60.75 Ohm
Implantable Lead Implant Date: 20121219
Implantable Lead Location: 753858
Implantable Lead Location: 753859
Implantable Lead Location: 753860
Lead Channel Impedance Value: 350 Ohm
Lead Channel Pacing Threshold Amplitude: 0.5 V
Lead Channel Pacing Threshold Amplitude: 2.5 V
Lead Channel Pacing Threshold Pulse Width: 0.5 ms
Lead Channel Pacing Threshold Pulse Width: 0.8 ms
Lead Channel Sensing Intrinsic Amplitude: 12 mV
Lead Channel Sensing Intrinsic Amplitude: 3.1 mV
Lead Channel Setting Pacing Amplitude: 1.5 V
Lead Channel Setting Pacing Pulse Width: 0.8 ms
Lead Channel Setting Sensing Sensitivity: 2 mV
MDC IDC LEAD IMPLANT DT: 20121219
MDC IDC LEAD IMPLANT DT: 20121219
MDC IDC MSMT LEADCHNL LV IMPEDANCE VALUE: 375 Ohm
MDC IDC MSMT LEADCHNL LV PACING THRESHOLD AMPLITUDE: 2.5 V
MDC IDC MSMT LEADCHNL LV PACING THRESHOLD PULSEWIDTH: 0.8 ms
MDC IDC MSMT LEADCHNL RA IMPEDANCE VALUE: 375 Ohm
MDC IDC MSMT LEADCHNL RV PACING THRESHOLD AMPLITUDE: 0.75 V
MDC IDC MSMT LEADCHNL RV PACING THRESHOLD PULSEWIDTH: 0.5 ms
MDC IDC SET LEADCHNL LV PACING AMPLITUDE: 3.5 V
MDC IDC SET LEADCHNL RV PACING AMPLITUDE: 2 V
MDC IDC SET LEADCHNL RV PACING PULSEWIDTH: 0.5 ms
Pulse Gen Serial Number: 7306740

## 2015-10-11 NOTE — Telephone Encounter (Signed)
Dave Daniels requested patient been seen in device clinic today to make program changes.   Call to patient and he agreed to appointment at 2:30 today for device clinic.

## 2015-10-11 NOTE — Progress Notes (Signed)
Industry check in office. LV1 threshold noted at 2.5V@0 .8ms, LV2 1.0@0 .8ms. Reprogramming changes LV1 cap confirm off, LV1 amplitude 3.5V @ 0.28ms, LV2 1.5V @ 0.718ms. Pt did not experience phrenic nerve stim with changes. Pt instructed to call if symptoms begin. Remote 10/30/2015. ROV with JA 12/02/15.

## 2015-10-11 NOTE — Telephone Encounter (Signed)
Received call from patient.  He reported his device was reprogramed yesterday at office visit with Gypsy BalsamAmber Seiler, NP.  She mentioned he may have some hiccups.  He describes feeling little kicks on left side of abdomen, beside the stomach area, almost continuously.  He asked if this is normal and will it go away.  Advised would discuss with Amber and call him back.

## 2015-10-14 NOTE — Addendum Note (Signed)
Addended by: Reesa ChewJONES, Zhoey Blackstock G on: 10/14/2015 12:08 PM   Modules accepted: Orders

## 2015-10-30 ENCOUNTER — Ambulatory Visit: Payer: Medicare HMO

## 2015-10-30 ENCOUNTER — Telehealth: Payer: Self-pay | Admitting: Cardiology

## 2015-10-30 NOTE — Progress Notes (Signed)
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  His new device is not compatible with his home monitor and should receive a new one in about 10 days.  He will send a transmission when he receives it.

## 2015-10-30 NOTE — Telephone Encounter (Signed)
Spoke with pt and reminded pt of remote transmission that is due today. Pt verbalized understanding.   

## 2015-10-31 ENCOUNTER — Other Ambulatory Visit: Payer: Self-pay | Admitting: Cardiology

## 2015-11-12 ENCOUNTER — Telehealth: Payer: Self-pay | Admitting: Cardiology

## 2015-11-12 ENCOUNTER — Ambulatory Visit (INDEPENDENT_AMBULATORY_CARE_PROVIDER_SITE_OTHER): Payer: Medicare HMO

## 2015-11-12 DIAGNOSIS — I5022 Chronic systolic (congestive) heart failure: Secondary | ICD-10-CM | POA: Diagnosis not present

## 2015-11-12 DIAGNOSIS — Z95810 Presence of automatic (implantable) cardiac defibrillator: Secondary | ICD-10-CM

## 2015-11-12 NOTE — Telephone Encounter (Signed)
Spoke with pt and reminded pt of remote transmission that is due today. Pt verbalized understanding.   

## 2015-11-14 ENCOUNTER — Telehealth: Payer: Self-pay | Admitting: Internal Medicine

## 2015-11-14 NOTE — Telephone Encounter (Signed)
Informed patient that remote was not received for ICM follow up. Verbal instructions given on how to send remote transmission. Patient stated that he didn't believe that he completed all the steps. Patient plans to try again tomorrow.   Will forward to [employer].

## 2015-11-14 NOTE — Telephone Encounter (Signed)
New message       1. Has your device fired? no  2. Is you device beeping? no  3. Are you experiencing draining or swelling at device site? no  4. Are you calling to see if we received your device transmission? The pt down loaded a transmission on Wednesday did not receive a confirmation it went through  5. Have you passed out? no

## 2015-11-15 NOTE — Telephone Encounter (Signed)
Received transmission and spoke with patient

## 2015-11-15 NOTE — Progress Notes (Signed)
EPIC Encounter for ICM Monitoring  Patient Name: Dave Daniels is a 75 y.o. male Date: 11/15/2015 Primary Care Physican: Doreatha Martin, MD Primary Cardiologist: Jens Som Electrophysiologist: [name] Dry Weight: 171 lb   Heart Failure questions reviewed, pt asymptomatic.  Patient has had gout flare up in the last 2 weeks.   Thoracic impedance abnormal suggesting fluid accumulation 11/11/2015 to 11/15/2015.  LABS: 12/02/2015 Creatinine 1.43, BUN 25, Potassium 4.1, Sodium 143 04/25/2015 Creatinine 1.6, BUN 31, Potassium 4.4, Sodium 143 12/02/2015 Creatinine 1.4, BUN 20, Potassium 4.4, Sodium 144  Recommendations: No recommendation to increase Furosemide due to patient is asymptomatic and has had gout for the last 2 weeks.  Advised to call if he has any fluid symptoms.  Will send copy to Dr Jens Som and Dr Johney Frame for any recommendations.   Office visit with Dr Johney Frame 12/02/2015.   ICM trend: 11/15/2015     Follow-up plan: ICM clinic phone appointment on 01/02/2016.  Office visit with Dr Johney Frame on 12/02/2015.  Copy of ICM check sent to primary cardiologist and device physician.   Karie Soda, RN 11/15/2015 1:15 PM

## 2015-11-19 ENCOUNTER — Encounter: Payer: Self-pay | Admitting: Internal Medicine

## 2015-12-02 ENCOUNTER — Encounter: Payer: Self-pay | Admitting: Internal Medicine

## 2015-12-02 ENCOUNTER — Ambulatory Visit (INDEPENDENT_AMBULATORY_CARE_PROVIDER_SITE_OTHER): Payer: Medicare HMO | Admitting: Internal Medicine

## 2015-12-02 VITALS — BP 110/76 | HR 85 | Ht 67.0 in | Wt 166.8 lb

## 2015-12-02 DIAGNOSIS — I5022 Chronic systolic (congestive) heart failure: Secondary | ICD-10-CM | POA: Diagnosis not present

## 2015-12-02 DIAGNOSIS — I255 Ischemic cardiomyopathy: Secondary | ICD-10-CM

## 2015-12-02 DIAGNOSIS — Z95810 Presence of automatic (implantable) cardiac defibrillator: Secondary | ICD-10-CM

## 2015-12-02 LAB — CUP PACEART INCLINIC DEVICE CHECK
Brady Statistic RA Percent Paced: 57 %
HIGH POWER IMPEDANCE MEASURED VALUE: 70.875
Implantable Lead Implant Date: 20121219
Implantable Lead Location: 753859
Lead Channel Impedance Value: 437.5 Ohm
Lead Channel Pacing Threshold Amplitude: 0.75 V
Lead Channel Pacing Threshold Amplitude: 2.5 V
Lead Channel Pacing Threshold Pulse Width: 0.5 ms
Lead Channel Pacing Threshold Pulse Width: 0.5 ms
Lead Channel Sensing Intrinsic Amplitude: 12 mV
Lead Channel Setting Pacing Amplitude: 1.5 V
Lead Channel Setting Pacing Amplitude: 2 V
Lead Channel Setting Pacing Amplitude: 3.5 V
Lead Channel Setting Pacing Pulse Width: 0.5 ms
MDC IDC LEAD IMPLANT DT: 20121219
MDC IDC LEAD IMPLANT DT: 20121219
MDC IDC LEAD LOCATION: 753858
MDC IDC LEAD LOCATION: 753860
MDC IDC MSMT BATTERY REMAINING LONGEVITY: 46.8
MDC IDC MSMT LEADCHNL LV IMPEDANCE VALUE: 425 Ohm
MDC IDC MSMT LEADCHNL LV PACING THRESHOLD PULSEWIDTH: 0.8 ms
MDC IDC MSMT LEADCHNL RA PACING THRESHOLD AMPLITUDE: 0.5 V
MDC IDC MSMT LEADCHNL RA SENSING INTR AMPL: 3.1 mV
MDC IDC MSMT LEADCHNL RV IMPEDANCE VALUE: 387.5 Ohm
MDC IDC PG SERIAL: 7306740
MDC IDC SESS DTM: 20170821115142
MDC IDC SET LEADCHNL LV PACING PULSEWIDTH: 0.8 ms
MDC IDC SET LEADCHNL RV SENSING SENSITIVITY: 2 mV
MDC IDC STAT BRADY RV PERCENT PACED: 99.91 %

## 2015-12-02 NOTE — Patient Instructions (Addendum)
Medication Instructions:  Your physician recommends that you continue on your current medications as directed. Please refer to the Current Medication list given to you today.   Labwork: None ordered   Testing/Procedures: Your physician has requested that you have an echocardiogram. Echocardiography is a painless test that uses sound waves to create images of your heart. It provides your doctor with information about the size and shape of your heart and how well your heart's chambers and valves are working. This procedure takes approximately one hour. There are no restrictions for this procedure.--already scheduled for 03/13/16.      Follow-Up: Your physician recommends that you schedule a follow-up appointment in: December with Dr Johney FrameAllred.  Same day as echo.  Get echo in morning then make follow up appointment after lunch    Any Other Special Instructions Will Be Listed Below (If Applicable).     If you need a refill on your cardiac medications before your next appointment, please call your pharmacy.

## 2015-12-02 NOTE — Progress Notes (Signed)
Electrophysiology Office Note   Date:  12/02/2015   ID:  Dave ServiceFabio A Cabell, DOB 19-Feb-1941, MRN [id number]  PCP:  Doreatha MartinVELAZQUEZ,GRETCHEN, MD  Cardiologist:  Dr Jens Somrenshaw Primary Electrophysiologist: Hillis RangeJames Tahji Tracy, MD    Chief Complaint  Patient presents with  . Follow-up    Chronic systolic dysfunction of LV     History of Present Illness: Dave Daniels is a 75 y.o. male who presents today for electrophysiology evaluation.   Doing well with NYHA Class II CHF symptoms.  He remains very active, without limitation.  Due to EF chronically of 30%, we turned MPP on 6/17.  He has not noticed any clinical improvement with this change.  His estimated battery longevity has however been reduced by 2 years.  Today, he denies symptoms of palpitations, chest pain, shortness of breath, orthopnea, PND, lower extremity edema, claudication, dizziness, presyncope, syncope, bleeding, or neurologic sequela. The patient is tolerating medications without difficulties and is otherwise without complaint today.    Past Medical History:  Diagnosis Date  . CAD (coronary artery disease)    [place] in 2010, then [place] [facility].  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  Marland Kitchen. Complete heart block (HCC)   . Gout   . Hyperlipidemia   . Hypertension   . Ischemic cardiomyopathy    EF 15% by echo 11/2010 and still 20-25% by follow-up echo 02/2011, s/p St. Jude Bi-V ICD implantation 04/01/11  . LBBB (left bundle branch block)   . Renal insufficiency    Cr 1.6 on 03/25/11   Past Surgical History:  Procedure Laterality Date  . BI-VENTRICULAR IMPLANTABLE CARDIOVERTER DEFIBRILLATOR N/A 04/01/2011   Procedure: BI-VENTRICULAR IMPLANTABLE CARDIOVERTER DEFIBRILLATOR  (CRT-D);  Surgeon: Marinus MawGregg W Taylor, MD;  [facility];  Service: Cardiovascular;  Laterality: N/A;  . CARDIAC DEFIBRILLATOR PLACEMENT  12/12   BiV ICD (SJM) implanted by Dr Johney FrameAllred  . Carpel tunnel surgery    . CORONARY ARTERY  BYPASS GRAFT  3/12   in [place]  . EP IMPLANTABLE DEVICE N/A 08/19/2015   Procedure: BIV ICD Generator Changeout;  Surgeon: Hillis RangeJames Ramere Downs, MD;  [facility];  Service: Cardiovascular;  Laterality: N/A;  . INGUINAL HERNIA REPAIR    . TONSILLECTOMY    . UMBILICAL HERNIA REPAIR       Current Outpatient Prescriptions  Medication Sig Dispense Refill  . Ascorbic Acid (VITAMIN C) 1000 MG tablet Take 1,000 mg by mouth daily.    Marland Kitchen. aspirin 81 MG tablet Take 81 mg by mouth every other day.     . carvedilol (COREG) 12.5 MG tablet TAKE ONE TABLET BY MOUTH TWICE DAILY WITH MEALS 30 tablet 3  . colchicine 0.6 MG tablet Take 0.6 mg by mouth daily as needed. Gout    . febuxostat (ULORIC) 40 MG tablet Take 40 mg by mouth daily.    . furosemide (LASIX) 40 MG tablet TAKE ONE & ONE-HALF TABLETS BY MOUTH ONCE DAILY 45 tablet 1  . indomethacin (INDOCIN) 50 MG capsule Take 1 capsule by mouth 2 (two) times daily as needed. Gout    . losartan (COZAAR) 25 MG tablet Take 25 mg by mouth daily.   3  . potassium chloride SA (K-DUR,KLOR-CON) 20 MEQ tablet Take 1 tablet (20 mEq total) by mouth daily. 30 tablet 11  . pravastatin (PRAVACHOL) 80 MG tablet Take 80 mg by mouth every other day. Pt taking 1 tablet daily    . Saw Palmetto, Serenoa repens, (SAW  PALMETTO PO) Take 1 tablet by mouth daily.     No current facility-administered medications for this visit.     Allergies:   Statins and Nsaids   Social History:  The patient  reports that he has never smoked. He has never used smokeless tobacco. He reports that he drinks alcohol. He reports that he does not use drugs.   ROS:  Please see the history of present illness.   All other systems are reviewed and negative.    PHYSICAL EXAM: VS:  BP 110/76   Pulse 85   Ht 5\' 7"  (1.702 m)   Wt 166 lb 12.8 oz (75.7 kg)   BMI 26.12 kg/m  , BMI Body mass index is 26.12 kg/m. GEN: Well nourished, well developed, in no acute distress  HEENT: normal  Neck: no  JVD, carotid bruits, or masses Cardiac: RRR; no murmurs, rubs, or gallops,no edema  Respiratory:  clear to auscultation bilaterally, normal work of breathing GI: soft, nontender, nondistended, + BS MS: no deformity or atrophy  Skin: warm and dry, device pocket is well healed Neuro:  Strength and sensation are intact Psych: euthymic mood, full affect  EKG:  EKG is ordered today. The ekg ordered today shows sinus with BiV pacing  Device interrogation is reviewed today in detail.  See PaceArt for details.   Recent Labs: 08/12/2015: BUN 25; Creat 1.43; Hemoglobin 14.1; Platelets 219; Potassium 4.1; Sodium 143    Lipid Panel     Component Value Date/Time   CHOL 153 07/13/2012 1434   TRIG 102 07/13/2012 1434   HDL 41 07/13/2012 1434   CHOLHDL 3.7 07/13/2012 1434   VLDL 20 07/13/2012 1434   LDLCALC 92 07/13/2012 1434     Wt Readings from Last 3 Encounters:  12/02/15 166 lb 12.8 oz (75.7 kg)  10/10/15 170 lb 6.4 oz (77.3 kg)  08/01/15 173 lb 6.4 oz (78.7 kg)     ASSESSMENT AND PLAN:  1.  Chronic systolic dysfunction euvolemic today Normal BiV ICD function See Pace Art report Today, I have turned LV1 output down from 3.5 to 3 V @ 0.8 msec (threshold is 2.5V @ 0.8 msec).  He has recent mild diaphragmatic stim.  I am hoping that this improves with the change today.  We have gained 6 months of battery with this change. He will return in December with an echo.  If EF has improved, we will decide about leaving MPP on together understanding that battery life will be affected.  If his EF has not improved, I will turn MPP off at that time.  Merlin I will see again in December as above  Current medicines are reviewed at length with the patient today.   The patient does not have concerns regarding his medicines.  The following changes were made today:  none  Signed, Hillis RangeJames Jeannifer Drakeford, MD  12/02/2015 11:33 AM     [facility] [address] [place]  [id number] [phone] (office) [phone] (fax)

## 2015-12-30 ENCOUNTER — Telehealth: Payer: Self-pay | Admitting: Internal Medicine

## 2015-12-30 NOTE — Telephone Encounter (Signed)
New message   Pt c/o medication issue:  1. Name of Medication: Fluorescamine   2. How are you currently taking this medication (dosage and times per day)? 40mg 1xday  3. Are you having a reaction (difficulty breathing--STAT)? no  4. What is your medication issue? He wants another prescription for a different medication he states that it is causing his gout

## 2015-12-30 NOTE — Telephone Encounter (Signed)
Has been on Furosemide for 2 years and his gout has been worsening over the last 4 months.  Thinks its related to his fluid pill.  Is there something else that he could take that will not cause his gout to flare?  Let patient know I would discuss with Dr Johney FrameAllred and call him back

## 2015-12-31 ENCOUNTER — Telehealth: Payer: Self-pay | Admitting: Cardiology

## 2015-12-31 ENCOUNTER — Encounter: Payer: Self-pay | Admitting: Internal Medicine

## 2015-12-31 NOTE — Telephone Encounter (Signed)
This encounter was created in error - please disregard.

## 2015-12-31 NOTE — Telephone Encounter (Signed)
Spoke with pt, he is having a lot of problems from gout and wants to know if there is something we can do about the furosemide. He has no edema except for the gout, no SOB and reports his weight is stable. It may vary 2 lb up or down. Will forward for dr Jens Somcrenshaw review and advise

## 2015-12-31 NOTE — Telephone Encounter (Signed)
Iris D Becton at 12/31/2015 1:18 PM   Status: Signed    New message     Pt returning nurse call.

## 2015-12-31 NOTE — Telephone Encounter (Signed)
New message ° ° ° ° ° ° °Pt returning nurse call  °

## 2015-12-31 NOTE — Telephone Encounter (Signed)
Discussed with Dr Johney FrameAllred, he says any fluid pill may have the same side effect and does not want to change anything at this point.  I suggested he speak with his MD who follows him for his gout and he says he is going to call Dr Jens Somrenshaw also.  He appreciated my call and me asking the doctor.

## 2015-12-31 NOTE — Telephone Encounter (Signed)
Please call,question about his Furosemide,

## 2015-12-31 NOTE — Telephone Encounter (Signed)
Spoke with pt, Aware of dr crenshaw's recommendations.  °

## 2015-12-31 NOTE — Telephone Encounter (Signed)
Pt will likely develop edema and dyspnea off lasix; would continue Rite AidBrian Crenshaw

## 2016-01-02 ENCOUNTER — Telehealth: Payer: Self-pay | Admitting: Cardiology

## 2016-01-02 ENCOUNTER — Ambulatory Visit (INDEPENDENT_AMBULATORY_CARE_PROVIDER_SITE_OTHER): Payer: Medicare HMO

## 2016-01-02 DIAGNOSIS — I5022 Chronic systolic (congestive) heart failure: Secondary | ICD-10-CM

## 2016-01-02 DIAGNOSIS — Z95810 Presence of automatic (implantable) cardiac defibrillator: Secondary | ICD-10-CM | POA: Diagnosis not present

## 2016-01-02 NOTE — Telephone Encounter (Signed)
Spoke with pt and reminded pt of remote transmission that is due today. Pt verbalized understanding.   

## 2016-01-03 ENCOUNTER — Telehealth: Payer: Self-pay

## 2016-01-03 NOTE — Progress Notes (Signed)
EPIC Encounter for ICM Monitoring  Patient Name: Boston ServiceFabio A Zamarripa is a 75 y.o. male Date: 01/03/2016 Primary Care Physican: Doreatha MartinVELAZQUEZ,GRETCHEN, MD Primary Cardiologist: Jens Somrenshaw Electrophysiologist: [name] Dry Weight: unknown Bi-V Pacing:  >99%       Attempted ICM call and unable to reach.  Transmission reviewed.   Telephone note from 12/31/2015, patient had called Dr Ludwig Clarksrenshaw's office to ask if he could stop Furosemide due to he was having gout flare.     Thoracic impedance abnormal suggesting fluid accumulation since 12/31/2015.  LABS:  08/12/2015 Creatinine 1.43, BUN 25, Potassium 4.1, Sodium 143 12/01/2013 Creatinine 1.4, BUN 20, Potassium 4.4, Sodium 144  Follow-up plan: ICM clinic phone appointment on 01/21/2016.  Copy of ICM check sent to primary cardiologist and device physician for review.  Unable to reach patient today.    ICM trend: 01/03/2016       Karie SodaLaurie S Havish Petties, RN 01/03/2016 10:45 AM

## 2016-01-03 NOTE — Telephone Encounter (Signed)
Remote ICM transmission received.  Attempted patient call and left message to return call.   

## 2016-01-03 NOTE — Progress Notes (Addendum)
Corrected lab date documentation LABS: 08/12/2015 Creatinine 1.43, BUN 25, Potassium 4.1, Sodium 143 12/01/2013 Creatinine 1.4, BUN 20, Potassium 4.4, Sodium 144

## 2016-01-06 NOTE — Progress Notes (Addendum)
Patient returned call.  He stated he is just now getting his gout under control.  He did stop Furosemide for 2 days that correlates when he started retaining fluid 01/01/2016.   He has stopped eating meat since gout flare up and lost about 10 lbs in last 3 weeks.  Current weight is 160 lb.   Advised will repeat ICM remote transmission to recheck fluid levels on 01/16/2016 to see if he can balance out the fluid levels without having to increase Furosemide since he has been having gout.  Encouraged to limit salt intake to 2000 mg daily and fluid intake to 2 liters daily.   Patient chooses not to keep his monitor by his bed so are done manually.  Advised to call if has any fluid symptoms.

## 2016-01-16 ENCOUNTER — Ambulatory Visit (INDEPENDENT_AMBULATORY_CARE_PROVIDER_SITE_OTHER): Payer: Medicare HMO

## 2016-01-16 ENCOUNTER — Telehealth: Payer: Self-pay

## 2016-01-16 DIAGNOSIS — Z95810 Presence of automatic (implantable) cardiac defibrillator: Secondary | ICD-10-CM | POA: Diagnosis not present

## 2016-01-16 DIAGNOSIS — I5022 Chronic systolic (congestive) heart failure: Secondary | ICD-10-CM

## 2016-01-16 NOTE — Progress Notes (Signed)
EPIC Encounter for ICM Monitoring  Patient Name: Dave Daniels is a 75 y.o. male Date: 01/16/2016 Primary Care Physican: Doreatha MartinVELAZQUEZ,GRETCHEN, MD Primary Cardiologist: Jens Somrenshaw Electrophysiologist: [name] Dry Weight:    unknown Bi-V Pacing:  >99%           Attempted ICM call and unable to reach.  Transmission reviewed.   Thoracic impedance returned to normal   LABS:  08/12/2015 Creatinine 1.43, BUN 25, Potassium 4.1, Sodium 143 12/01/2013 Creatinine 1.4, BUN 20, Potassium 4.4, Sodium 144  Follow-up plan: ICM clinic phone appointment on 03/02/2016.  Copy of ICM check sent to primary cardiologist and device physician.   ICM trend: 01/16/2016       Dave SodaLaurie S Aziza Stuckert, RN 01/16/2016 11:10 AM

## 2016-01-16 NOTE — Telephone Encounter (Signed)
Remote ICM transmission received.  Attempted patient call and left detailed message regarding transmission and next ICM scheduled for 03/02/2016.  Advised to return call for any fluid symptoms or questions.    

## 2016-02-09 ENCOUNTER — Other Ambulatory Visit: Payer: Self-pay | Admitting: Cardiology

## 2016-02-11 ENCOUNTER — Other Ambulatory Visit: Payer: Self-pay

## 2016-02-11 MED ORDER — FUROSEMIDE 40 MG PO TABS: 60.0000 mg | ORAL_TABLET | Freq: Every day | ORAL | 1 refills | Status: DC

## 2016-02-12 ENCOUNTER — Telehealth: Payer: Self-pay | Admitting: Cardiology

## 2016-02-12 NOTE — Telephone Encounter (Signed)
Spoke with Raoul Pitchsierra, she has found what she needed.

## 2016-02-24 ENCOUNTER — Other Ambulatory Visit: Payer: Self-pay | Admitting: Cardiology

## 2016-03-02 ENCOUNTER — Encounter: Payer: Medicare HMO | Admitting: *Deleted

## 2016-03-02 ENCOUNTER — Telehealth: Payer: Self-pay | Admitting: Cardiology

## 2016-03-02 NOTE — Telephone Encounter (Signed)
LMOVM reminding pt to send remote transmission.   

## 2016-03-04 ENCOUNTER — Encounter: Payer: Self-pay | Admitting: Cardiology

## 2016-03-09 ENCOUNTER — Telehealth: Payer: Self-pay | Admitting: Cardiology

## 2016-03-09 ENCOUNTER — Encounter: Payer: Medicare HMO | Admitting: *Deleted

## 2016-03-09 ENCOUNTER — Encounter: Payer: Self-pay | Admitting: Internal Medicine

## 2016-03-09 NOTE — Telephone Encounter (Signed)
Spoke with pt and reminded pt of remote transmission that is due today. Pt verbalized understanding and said he was not going to send a transmission today because he is seeing MD next week.

## 2016-03-13 ENCOUNTER — Encounter: Payer: Self-pay | Admitting: Cardiology

## 2016-03-13 ENCOUNTER — Other Ambulatory Visit (HOSPITAL_COMMUNITY): Payer: Medicare HMO

## 2016-03-16 ENCOUNTER — Other Ambulatory Visit: Payer: Self-pay

## 2016-03-16 ENCOUNTER — Ambulatory Visit (INDEPENDENT_AMBULATORY_CARE_PROVIDER_SITE_OTHER): Payer: Medicare HMO | Admitting: Internal Medicine

## 2016-03-16 ENCOUNTER — Ambulatory Visit (HOSPITAL_COMMUNITY): Payer: Medicare HMO | Attending: Cardiology

## 2016-03-16 ENCOUNTER — Encounter: Payer: Self-pay | Admitting: Internal Medicine

## 2016-03-16 VITALS — BP 112/70 | HR 84 | Ht 67.0 in | Wt 166.4 lb

## 2016-03-16 DIAGNOSIS — I130 Hypertensive heart and chronic kidney disease with heart failure and stage 1 through stage 4 chronic kidney disease, or unspecified chronic kidney disease: Secondary | ICD-10-CM | POA: Insufficient documentation

## 2016-03-16 DIAGNOSIS — I5022 Chronic systolic (congestive) heart failure: Secondary | ICD-10-CM | POA: Diagnosis not present

## 2016-03-16 DIAGNOSIS — I255 Ischemic cardiomyopathy: Secondary | ICD-10-CM

## 2016-03-16 DIAGNOSIS — I509 Heart failure, unspecified: Secondary | ICD-10-CM | POA: Diagnosis present

## 2016-03-16 DIAGNOSIS — E785 Hyperlipidemia, unspecified: Secondary | ICD-10-CM | POA: Diagnosis not present

## 2016-03-16 DIAGNOSIS — Z95810 Presence of automatic (implantable) cardiac defibrillator: Secondary | ICD-10-CM | POA: Diagnosis not present

## 2016-03-16 DIAGNOSIS — I351 Nonrheumatic aortic (valve) insufficiency: Secondary | ICD-10-CM | POA: Insufficient documentation

## 2016-03-16 DIAGNOSIS — I2510 Atherosclerotic heart disease of native coronary artery without angina pectoris: Secondary | ICD-10-CM | POA: Insufficient documentation

## 2016-03-16 DIAGNOSIS — N189 Chronic kidney disease, unspecified: Secondary | ICD-10-CM | POA: Insufficient documentation

## 2016-03-16 DIAGNOSIS — R29898 Other symptoms and signs involving the musculoskeletal system: Secondary | ICD-10-CM | POA: Diagnosis not present

## 2016-03-16 LAB — CUP PACEART INCLINIC DEVICE CHECK
Date Time Interrogation Session: 20171204191437
HighPow Impedance: 67.5 Ohm
Implantable Lead Implant Date: 20121219
Implantable Lead Location: 753858
Implantable Lead Location: 753860
Lead Channel Pacing Threshold Amplitude: 0.75 V
Lead Channel Sensing Intrinsic Amplitude: 11.6 mV
Lead Channel Setting Pacing Amplitude: 2 V
Lead Channel Setting Pacing Amplitude: 2.875
Lead Channel Setting Pacing Pulse Width: 0.5 ms
Lead Channel Setting Pacing Pulse Width: 0.6 ms
MDC IDC LEAD IMPLANT DT: 20121219
MDC IDC LEAD IMPLANT DT: 20121219
MDC IDC LEAD LOCATION: 753859
MDC IDC MSMT LEADCHNL LV IMPEDANCE VALUE: 775 Ohm
MDC IDC MSMT LEADCHNL LV PACING THRESHOLD AMPLITUDE: 2.125 V
MDC IDC MSMT LEADCHNL LV PACING THRESHOLD PULSEWIDTH: 0.6 ms
MDC IDC MSMT LEADCHNL RA IMPEDANCE VALUE: 437.5 Ohm
MDC IDC MSMT LEADCHNL RA PACING THRESHOLD AMPLITUDE: 0.5 V
MDC IDC MSMT LEADCHNL RA PACING THRESHOLD PULSEWIDTH: 0.5 ms
MDC IDC MSMT LEADCHNL RA SENSING INTR AMPL: 4.6 mV
MDC IDC MSMT LEADCHNL RV IMPEDANCE VALUE: 387.5 Ohm
MDC IDC MSMT LEADCHNL RV PACING THRESHOLD PULSEWIDTH: 0.5 ms
MDC IDC PG IMPLANT DT: 20170508
MDC IDC SET LEADCHNL RA PACING AMPLITUDE: 1.5 V
MDC IDC SET LEADCHNL RV SENSING SENSITIVITY: 2 mV
MDC IDC STAT BRADY RA PERCENT PACED: 48 %
MDC IDC STAT BRADY RV PERCENT PACED: 99.89 %
Pulse Gen Serial Number: 7306740

## 2016-03-16 NOTE — Progress Notes (Unsigned)
No ICM remote transmission received for 03/02/2016 and next ICM transmission scheduled for 04/17/2015 since patient has in office defib check with Dr Johney FrameAllred on 03/16/2016.

## 2016-03-16 NOTE — Progress Notes (Signed)
Electrophysiology Office Note   Date:  03/16/2016   ID:  Boston ServiceFabio A Daniels, DOB 1940-05-01, MRN [id number]  PCP:  Doreatha MartinVELAZQUEZ,GRETCHEN, MD  Cardiologist:  Dr Jens Somrenshaw Primary Electrophysiologist: Dave RangeJames Jaeven Wanzer, MD    Chief Complaint  Patient presents with  . Follow-up    Chronic systolic heart failure     History of Present Illness: [name] is a 75 y.o. male who presents today for electrophysiology evaluation.   Doing well with NYHA Class II CHF symptoms.  He remains very active, without limitation.  Due to EF chronically of 30%, we turned MPP on 6/17.  He has not noticed any clinical improvement with this change.  His estimated battery longevity has however been reduced by 2 years.  He returns today with repeat echo to assess whether or not to keep MPP turned on.  Today, he denies symptoms of palpitations, chest pain, shortness of breath, orthopnea, PND, lower extremity edema, claudication, dizziness, presyncope, syncope, bleeding, or neurologic sequela. The patient is tolerating medications without difficulties and is otherwise without complaint today.    Past Medical History:  Diagnosis Date  . CAD (coronary artery disease)    [place], [place].  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  Marland Kitchen. Complete heart block (HCC)   . Gout   . Hyperlipidemia   . Hypertension   . Ischemic cardiomyopathy    EF 15% by echo 11/2010 and still 20-25% by follow-up echo 02/2011, s/p St[employer] Jude Bi-V ICD implantation 04/01/11  . LBBB (left bundle branch block)   . Renal insufficiency    Cr 1.6 on 03/25/11   Past Surgical History:  Procedure Laterality Date  . BI-VENTRICULAR IMPLANTABLE CARDIOVERTER DEFIBRILLATOR N/A 04/01/2011   Procedure: BI-VENTRICULAR IMPLANTABLE CARDIOVERTER DEFIBRILLATOR  (CRT-D);  Surgeon: Marinus MawGregg W Taylor, MD;  [facility];  Service: Cardiovascular;  Laterality: N/A;  . CARDIAC DEFIBRILLATOR PLACEMENT  12/12   BiV ICD  (SJM) implanted by Dr Johney FrameAllred  . Carpel tunnel surgery    . CORONARY ARTERY BYPASS GRAFT  3/12   [place]  . EP IMPLANTABLE DEVICE N/A 08/19/2015   Procedure: BIV ICD Generator Changeout;  Surgeon: Dave RangeJames Amylynn Fano, MD;  [facility];  Service: Cardiovascular;  Laterality: N/A;  . INGUINAL HERNIA REPAIR    . TONSILLECTOMY    . UMBILICAL HERNIA REPAIR       Current Outpatient Prescriptions  Medication Sig Dispense Refill  . aspirin 81 MG tablet Take 81 mg by mouth every other day.     . carvedilol (COREG) 12.5 MG tablet TAKE ONE TABLET BY MOUTH TWICE DAILY WITH MEALS 30 tablet 3  . colchicine 0.6 MG tablet Take 0.6 mg by mouth daily as needed. Gout    . febuxostat (ULORIC) 40 MG tablet Take 40 mg by mouth daily.    . furosemide (LASIX) 40 MG tablet Take 1.5 tablets (60 mg total) by mouth daily. 45 tablet 1  . indomethacin (INDOCIN) 50 MG capsule Take 1 capsule by mouth 2 (two) times daily as needed. Gout    . losartan (COZAAR) 25 MG tablet Take 25 mg by mouth daily.   3  . potassium chloride SA (K-DUR,KLOR-CON) 20 MEQ tablet Take 1 tablet (20 mEq total) by mouth daily. 30 tablet 11  . pravastatin (PRAVACHOL) 80 MG tablet Take 80 mg by mouth every other day.     . Saw Palmetto, Serenoa repens, (SAW PALMETTO PO) Take 1 tablet  by mouth daily.     No current facility-administered medications for this visit.     Allergies:   Statins and Nsaids   Social History:  The patient  reports that he has never smoked. He has never used smokeless tobacco. He reports that he drinks alcohol. He reports that he does not use drugs.   ROS:  Please see the history of present illness.   All other systems are reviewed and negative.    PHYSICAL EXAM: VS:  BP 112/70   Pulse 84   Ht 5\' 7"  (1.702 m)   Wt 166 lb 6.4 oz (75.5 kg)   BMI 26.06 kg/m  , BMI Body mass index is 26.06 kg/m. GEN: Well nourished, well developed, in no acute distress  HEENT: normal  Neck: no JVD, carotid bruits, or  masses Cardiac: RRR; no murmurs, rubs, or gallops,no edema  Respiratory:  clear to auscultation bilaterally, normal work of breathing GI: soft, nontender, nondistended, + BS MS: no deformity or atrophy  Skin: warm and dry, device pocket is well healed Neuro:  Strength and sensation are intact Psych: euthymic mood, full affect  EKG:  EKG is ordered today. The ekg ordered today shows sinus with BiV pacing  Device interrogation is reviewed today in detail.  See PaceArt for details.   Recent Labs: 08/12/2015: BUN 25; Creat 1.43; Hemoglobin 14.1; Platelets 219; Potassium 4.1; Sodium 143    Lipid Panel     Component Value Date/Time   CHOL 153 07/13/2012 1434   TRIG 102 07/13/2012 1434   HDL 41 07/13/2012 1434   CHOLHDL 3.7 07/13/2012 1434   VLDL 20 07/13/2012 1434   LDLCALC 92 07/13/2012 1434     Wt Readings from Last 3 Encounters:  03/16/16 166 lb 6.4 oz (75.5 kg)  12/02/15 166 lb 12.8 oz (75.7 kg)  10/10/15 170 lb 6.4 oz (77.3 kg)     ASSESSMENT AND PLAN:  1.  Chronic systolic dysfunction euvolemic today Normal BiV ICD function See Pace Art report  Will turn MPP off today as EF has not improved and clinically, he is unchanged.  This will help preserve battery.  Merlin Return to see me in a year Follow-up with Dr Jens Somrenshaw as scheduled.  Current medicines are reviewed at length with the patient today.   The patient does not have concerns regarding his medicines.  The following changes were made today:  none  Signed, Dave RangeJames Jamilynn Whitacre, MD  03/16/2016 2:58 PM     [facility] [address] [place] [address] [phone] (office) [phone] (fax)

## 2016-03-16 NOTE — Patient Instructions (Signed)
Medication Instructions:  Your physician recommends that you continue on your current medications as directed. Please refer to the Current Medication list given to you today.  Labwork: None ordered   Testing/Procedures: None ordered   Follow-Up: Your physician wants you to follow-up in: 12 months with Dr. Johney FrameAllred. You will receive a reminder letter in the mail two months in advance. If you don't receive a letter, please call our office to schedule the follow-up appointment.  Remote monitoring is used to monitor your ICD from home. This monitoring reduces the number of office visits required to check your device to one time per year. It allows us to keep an eye on the functioning of your device to ensure it is working properly. You are scheduled for a device check from home on 06/15/2016. You may send your transmission at any time that day. If you have a wireless device, the transmission will be sent automatically. After your physician reviews your transmission, you will receive a postcard with your next transmission date.    Any Other Special Instructions Will Be Listed Below (If Applicable).     If you need a refill on your cardiac medications before your next appointment, please call your pharmacy.

## 2016-03-26 ENCOUNTER — Telehealth: Payer: Self-pay | Admitting: Internal Medicine

## 2016-03-26 NOTE — Telephone Encounter (Signed)
New message   Shay calling for rn for st judes

## 2016-03-27 NOTE — Telephone Encounter (Signed)
Called pt back, pt stated that SJ was supposed to send him an adapter for his home monitor but had not received it yet. Informed pt that I would contact the SJ rep. Pt voiced understanding.

## 2016-03-27 NOTE — Telephone Encounter (Signed)
Called pt and informed him that I had spoken with [employer] and a wireless adapter would be sent out and he should receive it by Saturday. PT voiced understanding.

## 2016-04-02 ENCOUNTER — Telehealth: Payer: Self-pay

## 2016-04-02 NOTE — Telephone Encounter (Signed)
°  Follow Up   Pt states he would like to speak to you again regarding his device. Please call.

## 2016-04-02 NOTE — Telephone Encounter (Signed)
Called pt to see if he had received wireless adapter for transmitter, pt stated that he had not received it but would double check and call back.   Pt stated that he had no received the adapter, pt suggested that he come to the office and pick up the adapter. Informed pt that this would most likely be the optimal plan. Informed pt that someone would call him when an adapter arrived at the office. Pt voiced understanding.

## 2016-04-08 ENCOUNTER — Telehealth: Payer: Self-pay

## 2016-04-08 NOTE — Telephone Encounter (Signed)
Called pt and let him know that the wireless adapter for him home monitor is at the [address] office and he could pick it up at his earliest convince. Pt voiced understanding.

## 2016-04-16 ENCOUNTER — Telehealth: Payer: Self-pay | Admitting: Internal Medicine

## 2016-04-16 ENCOUNTER — Telehealth: Payer: Self-pay | Admitting: Cardiology

## 2016-04-16 ENCOUNTER — Ambulatory Visit (INDEPENDENT_AMBULATORY_CARE_PROVIDER_SITE_OTHER): Payer: Medicare HMO

## 2016-04-16 DIAGNOSIS — Z95810 Presence of automatic (implantable) cardiac defibrillator: Secondary | ICD-10-CM

## 2016-04-16 DIAGNOSIS — I5022 Chronic systolic (congestive) heart failure: Secondary | ICD-10-CM | POA: Diagnosis not present

## 2016-04-16 NOTE — Telephone Encounter (Signed)
°  New Prob   Pt has some questions regarding his transmission appointment. Please call.

## 2016-04-16 NOTE — Telephone Encounter (Signed)
Spoke with pt and reminded pt of remote transmission that is due today. Pt verbalized understanding and said he wold do it Friday 04-17-16.

## 2016-04-16 NOTE — Telephone Encounter (Signed)
Call back to patient.  He wanted to confirm a transmission is due today and advised to send at his earliest convenience.  He will send tomorrow.

## 2016-04-17 NOTE — Progress Notes (Signed)
EPIC Encounter for ICM Monitoring  Patient Name: Boston ServiceFabio A Alemu is a 76 y.o. male Date: 04/17/2016 Primary Care Physican: Doreatha MartinVELAZQUEZ,GRETCHEN, MD Primary Cardiologist:Crenshaw Electrophysiologist: [name] Dry Weight:unknown Bi-V Pacing: >99%                                               Heart Failure questions reviewed, pt asymptomatic   Thoracic impedance normal   Recommendations: No changes.  Reinforced to limit low salt food choices to 2000 mg day and limiting fluid intake to < 2 liters per day. Encouraged to call for fluid symptoms.    Follow-up plan: ICM clinic phone appointment on 05/19/2016.  Copy of ICM check sent to sevice physician.   3 month ICM trend : 04/17/2016   1 Year ICM trend:      Karie SodaLaurie S Short, RN 04/17/2016 2:02 PM

## 2016-05-19 ENCOUNTER — Ambulatory Visit (INDEPENDENT_AMBULATORY_CARE_PROVIDER_SITE_OTHER): Payer: Medicare HMO

## 2016-05-19 DIAGNOSIS — I5022 Chronic systolic (congestive) heart failure: Secondary | ICD-10-CM

## 2016-05-19 DIAGNOSIS — Z95810 Presence of automatic (implantable) cardiac defibrillator: Secondary | ICD-10-CM

## 2016-05-20 ENCOUNTER — Telehealth: Payer: Self-pay | Admitting: Cardiology

## 2016-05-20 NOTE — Telephone Encounter (Signed)
LMOVM for pt informing him that we did receive his remote transmission on 05-19-16 and he [facility] be [facility] either Thursday or Friday.

## 2016-05-21 NOTE — Progress Notes (Signed)
EPIC Encounter for ICM Monitoring  Patient Name: Boston ServiceFabio A Lacewell is a 76 y.o. male Date: 05/21/2016 Primary Care Physican: Doreatha MartinVELAZQUEZ,GRETCHEN, MD Primary Cardiologist:Crenshaw Electrophysiologist: [name] Bi-V Pacing: >99% Dry Weight:  unknown        Heart Failure questions reviewed, pt asymptomatic   Thoracic impedance normal .  Recommendations: No changes. Reminded to limit dietary salt intake to 2000 mg/day and fluid intake to < 2 liters/day. Encouraged to call for fluid symptoms.  Follow-up plan: ICM clinic phone appointment on 06/23/2016.  Copy of ICM check sent to device physician.   3 month ICM trend: 05/19/2016   1 Year ICM trend:      Karie SodaLaurie S Short, RN 05/21/2016 1:01 PM

## 2016-05-22 ENCOUNTER — Other Ambulatory Visit: Payer: Self-pay | Admitting: Internal Medicine

## 2016-06-23 ENCOUNTER — Telehealth: Payer: Self-pay | Admitting: Cardiology

## 2016-06-23 ENCOUNTER — Ambulatory Visit (INDEPENDENT_AMBULATORY_CARE_PROVIDER_SITE_OTHER): Payer: Medicare HMO | Admitting: *Deleted

## 2016-06-23 DIAGNOSIS — Z95810 Presence of automatic (implantable) cardiac defibrillator: Secondary | ICD-10-CM | POA: Diagnosis not present

## 2016-06-23 DIAGNOSIS — I255 Ischemic cardiomyopathy: Secondary | ICD-10-CM | POA: Diagnosis not present

## 2016-06-23 DIAGNOSIS — I519 Heart disease, unspecified: Secondary | ICD-10-CM

## 2016-06-23 DIAGNOSIS — I5022 Chronic systolic (congestive) heart failure: Secondary | ICD-10-CM

## 2016-06-23 NOTE — Telephone Encounter (Signed)
LMOVM reminding pt to send remote transmission.   

## 2016-06-23 NOTE — Progress Notes (Signed)
Remote ICD transmission.   

## 2016-06-24 ENCOUNTER — Encounter: Payer: Self-pay | Admitting: Cardiology

## 2016-06-25 ENCOUNTER — Telehealth: Payer: Self-pay

## 2016-06-25 NOTE — Progress Notes (Signed)
EPIC Encounter for ICM Monitoring  Patient Name: Dave Daniels is a 76 y.o. male Date: 06/25/2016 Primary Care Physican: Doreatha MartinVELAZQUEZ,GRETCHEN, MD Primary Cardiologist:Crenshaw Electrophysiologist: [name] Bi-V Pacing: >99% Dry Weight:     unknown      Attempted call to patient and unable to reach.  Left message to return call.  Transmission reviewed.    Thoracic impedance normal but was abnormal suggesting fluid accumulation from 05/31/2016 to 06/08/2016.  Prescribed dosage: Furosemide 40 mg 1.5 tablets (60 mg total) daily.  Potassium 20 mEq 1 tablet daily.  Recommendations: NONE - Unable to reach patient   Follow-up plan: ICM clinic phone appointment on 07/28/2016.  Copy of ICM check sent to device physician.   3 month ICM trend: 06/23/2016   1 Year ICM trend:      Karie SodaLaurie S Short, RN 06/25/2016 2:59 PM

## 2016-06-25 NOTE — Telephone Encounter (Signed)
Remote ICM transmission received.  Attempted patient call and left message to return call.   

## 2016-06-25 NOTE — Progress Notes (Signed)
Patient returned call.  He stated he is feeling fine.  Transmission reviewed.  He reported not taking his diuretic for a few days in April but is back to prescribed dosage. No changes today.

## 2016-06-26 LAB — CUP PACEART REMOTE DEVICE CHECK
Battery Voltage: 2.96 V
Brady Statistic AP VS Percent: 1 %
Brady Statistic AS VS Percent: 1 %
HighPow Impedance: 65 Ohm
HighPow Impedance: 65 Ohm
Implantable Lead Implant Date: 20121219
Implantable Lead Location: 753858
Implantable Lead Location: 753859
Implantable Lead Location: 753860
Implantable Pulse Generator Implant Date: 20170508
Lead Channel Impedance Value: 410 Ohm
Lead Channel Impedance Value: 740 Ohm
Lead Channel Pacing Threshold Pulse Width: 0.5 ms
Lead Channel Sensing Intrinsic Amplitude: 12 mV
Lead Channel Sensing Intrinsic Amplitude: 5 mV
Lead Channel Setting Pacing Amplitude: 2 V
Lead Channel Setting Pacing Amplitude: 2.75 V
Lead Channel Setting Pacing Pulse Width: 0.5 ms
MDC IDC LEAD IMPLANT DT: 20121219
MDC IDC LEAD IMPLANT DT: 20121219
MDC IDC MSMT BATTERY REMAINING LONGEVITY: 59 mo
MDC IDC MSMT BATTERY REMAINING PERCENTAGE: 80 %
MDC IDC MSMT LEADCHNL LV PACING THRESHOLD AMPLITUDE: 1.75 V
MDC IDC MSMT LEADCHNL LV PACING THRESHOLD PULSEWIDTH: 0.6 ms
MDC IDC MSMT LEADCHNL RA PACING THRESHOLD AMPLITUDE: 0.5 V
MDC IDC MSMT LEADCHNL RV IMPEDANCE VALUE: 350 Ohm
MDC IDC MSMT LEADCHNL RV PACING THRESHOLD AMPLITUDE: 0.625 V
MDC IDC MSMT LEADCHNL RV PACING THRESHOLD PULSEWIDTH: 0.5 ms
MDC IDC SESS DTM: 20180313191547
MDC IDC SET LEADCHNL LV PACING PULSEWIDTH: 0.6 ms
MDC IDC SET LEADCHNL RA PACING AMPLITUDE: 1.5 V
MDC IDC SET LEADCHNL RV SENSING SENSITIVITY: 2 mV
MDC IDC STAT BRADY AP VP PERCENT: 50 %
MDC IDC STAT BRADY AS VP PERCENT: 50 %
MDC IDC STAT BRADY RA PERCENT PACED: 50 %
Pulse Gen Serial Number: 7306740

## 2016-06-29 ENCOUNTER — Other Ambulatory Visit: Payer: Self-pay | Admitting: Cardiology

## 2016-07-07 ENCOUNTER — Telehealth: Payer: Self-pay | Admitting: Internal Medicine

## 2016-07-07 NOTE — Telephone Encounter (Signed)
Spoke with pt and informed that he would need to send a remote a both days he is scheduled for, one remote [facility] and the other remote is the transmission that the device tech processes and sends to the doctor. Pt voiced understanding

## 2016-07-07 NOTE — Telephone Encounter (Signed)
New message   Pt is calling to find out if he should have home checks on both days he is scheduled for.

## 2016-07-21 ENCOUNTER — Telehealth: Payer: Self-pay | Admitting: Internal Medicine

## 2016-07-28 ENCOUNTER — Telehealth: Payer: Self-pay

## 2016-07-28 ENCOUNTER — Telehealth: Payer: Self-pay | Admitting: Cardiology

## 2016-07-28 ENCOUNTER — Ambulatory Visit (INDEPENDENT_AMBULATORY_CARE_PROVIDER_SITE_OTHER): Payer: Medicare HMO

## 2016-07-28 DIAGNOSIS — Z95810 Presence of automatic (implantable) cardiac defibrillator: Secondary | ICD-10-CM | POA: Diagnosis not present

## 2016-07-28 DIAGNOSIS — I5022 Chronic systolic (congestive) heart failure: Secondary | ICD-10-CM

## 2016-07-28 NOTE — Telephone Encounter (Signed)
Remote ICM transmission received.  Attempted patient call and left detailed message regarding transmission and next ICM scheduled for 08/28/2016.  Advised to return call for any fluid symptoms or questions.    

## 2016-07-28 NOTE — Telephone Encounter (Signed)
LMOVM reminding pt to send remote transmission.   

## 2016-07-28 NOTE — Progress Notes (Signed)
EPIC Encounter for ICM Monitoring  Patient Name: Dave Daniels is a 76 y.o. male Date: 07/28/2016 Primary Care Physican: Doreatha Martin, MD Primary Cardiologist:Crenshaw Electrophysiologist: [name] Bi-V Pacing: >99% Dry Weight:     unknown       Attempted call to patient and unable to reach.  Left detailed message regarding transmission.  Transmission reviewed.    Thoracic impedance normal but was abnormal suggesting fluid accumulation from 07/14/2016 to 07/23/2016.  Prescribed dosage: Furosemide 40 mg 1.5 tablets (60 mg total) daily.  Potassium 20 mEq 1 tablet daily.  Recommendations: Left voice mail with ICM number and encouraged to call for fluid symptoms.  Follow-up plan: ICM clinic phone appointment on 08/28/2016.    Copy of ICM check sent to device physician.   3 month ICM trend: 07/28/2016   1 Year ICM trend:      Karie Soda, RN 07/28/2016 4:23 PM

## 2016-08-28 ENCOUNTER — Telehealth: Payer: Self-pay | Admitting: Cardiology

## 2016-08-28 ENCOUNTER — Encounter: Payer: Self-pay | Admitting: Cardiology

## 2016-08-28 ENCOUNTER — Ambulatory Visit (INDEPENDENT_AMBULATORY_CARE_PROVIDER_SITE_OTHER): Payer: Medicare HMO

## 2016-08-28 DIAGNOSIS — I5022 Chronic systolic (congestive) heart failure: Secondary | ICD-10-CM

## 2016-08-28 DIAGNOSIS — Z95810 Presence of automatic (implantable) cardiac defibrillator: Secondary | ICD-10-CM

## 2016-08-28 NOTE — Progress Notes (Signed)
EPIC Encounter for ICM Monitoring  Patient Name: Dave Daniels is a 76 y.o. male Date: 08/28/2016 Primary Care Physican: Cheron SchaumannVelazquez, Gretchen Y., MD Primary Cardiologist:Crenshaw Electrophysiologist: [name] Bi-V Pacing: >99% DryWeight:unknown       Heart Failure questions reviewed, pt asymptomatic.   Thoracic impedance normal but was abnormal suggesting fluid accumulation from 08/04/2016 to 08/10/2016, 08/15/2016 to 08/18/2016 and 08/23/2016 to 08/25/2016.  Prescribed dosage: Furosemide 40 mg 1.5 tablets (60 mg total) daily. Potassium 20 mEq 1 tablet daily.  Recommendations: No changes.  Encouraged to call for fluid symptoms or use local ER for any urgent symptoms.  Follow-up plan: ICM clinic phone appointment on 09/29/2016.    Copy of ICM check sent to device physician.   3 month ICM trend: 08/28/2016   1 Year ICM trend:      Karie SodaLaurie S Jes Costales, RN 08/28/2016 12:12 PM

## 2016-08-28 NOTE — Telephone Encounter (Signed)
Spoke with pt and reminded pt of remote transmission that is due today. Pt verbalized understanding.   

## 2016-09-29 ENCOUNTER — Telehealth: Payer: Self-pay | Admitting: Cardiology

## 2016-09-29 ENCOUNTER — Ambulatory Visit (INDEPENDENT_AMBULATORY_CARE_PROVIDER_SITE_OTHER): Payer: Medicare HMO | Admitting: *Deleted

## 2016-09-29 DIAGNOSIS — I255 Ischemic cardiomyopathy: Secondary | ICD-10-CM

## 2016-09-29 DIAGNOSIS — Z95810 Presence of automatic (implantable) cardiac defibrillator: Secondary | ICD-10-CM | POA: Diagnosis not present

## 2016-09-29 DIAGNOSIS — I5022 Chronic systolic (congestive) heart failure: Secondary | ICD-10-CM | POA: Diagnosis not present

## 2016-09-29 NOTE — Telephone Encounter (Signed)
Spoke with pt and reminded pt of remote transmission that is due today. Pt verbalized understanding.   

## 2016-10-01 NOTE — Progress Notes (Signed)
Remote ICD transmission.   

## 2016-10-02 ENCOUNTER — Encounter: Payer: Self-pay | Admitting: Cardiology

## 2016-10-02 NOTE — Progress Notes (Signed)
EPIC Encounter for ICM Monitoring  Patient Name: Boston ServiceFabio A Lemus is a 76 y.o. male Date: 10/02/2016 Primary Care Physican: Cheron SchaumannVelazquez, Gretchen Y., MD Primary Cardiologist:Crenshaw Electrophysiologist: [name] Bi-V Pacing: >99% DryWeight:unknown                                              Heart Failure questions reviewed, pt asymptomatic.   Thoracic impedance normal but was abnormal suggesting fluid accumulation from 09/11/2016 to 09/14/2016 and 09/17/2016 to 09/19/2016.  Prescribed dosage: Furosemide 40 mg 1.5 tablets (60 mg total) daily. Potassium 20 mEq 1 tablet daily.  Recommendations: No changes.   Encouraged to call for fluid symptoms.  Follow-up plan: ICM clinic phone appointment on 11/02/2016.    Copy of ICM check sent to device physician.   3 month ICM trend: 10/02/2016   1 Year ICM trend:      Karie SodaLaurie S Short, RN 10/02/2016 9:18 AM

## 2016-10-06 LAB — CUP PACEART REMOTE DEVICE CHECK
Brady Statistic AP VP Percent: 49 %
Brady Statistic AP VS Percent: 1 %
Brady Statistic AS VP Percent: 51 %
Brady Statistic AS VS Percent: 1 %
Date Time Interrogation Session: 20180619185613
HighPow Impedance: 68 Ohm
HighPow Impedance: 68 Ohm
Implantable Lead Implant Date: 20121219
Implantable Lead Implant Date: 20121219
Implantable Lead Location: 753859
Lead Channel Impedance Value: 380 Ohm
Lead Channel Pacing Threshold Amplitude: 0.5 V
Lead Channel Pacing Threshold Pulse Width: 0.5 ms
Lead Channel Pacing Threshold Pulse Width: 0.6 ms
Lead Channel Sensing Intrinsic Amplitude: 12 mV
Lead Channel Sensing Intrinsic Amplitude: 4 mV
Lead Channel Setting Pacing Amplitude: 1.5 V
Lead Channel Setting Pacing Amplitude: 2 V
Lead Channel Setting Pacing Pulse Width: 0.6 ms
MDC IDC LEAD IMPLANT DT: 20121219
MDC IDC LEAD LOCATION: 753858
MDC IDC LEAD LOCATION: 753860
MDC IDC MSMT BATTERY REMAINING LONGEVITY: 58 mo
MDC IDC MSMT BATTERY REMAINING PERCENTAGE: 76 %
MDC IDC MSMT BATTERY VOLTAGE: 2.95 V
MDC IDC MSMT LEADCHNL LV IMPEDANCE VALUE: 790 Ohm
MDC IDC MSMT LEADCHNL LV PACING THRESHOLD AMPLITUDE: 2.125 V
MDC IDC MSMT LEADCHNL RA IMPEDANCE VALUE: 400 Ohm
MDC IDC MSMT LEADCHNL RA PACING THRESHOLD PULSEWIDTH: 0.5 ms
MDC IDC MSMT LEADCHNL RV PACING THRESHOLD AMPLITUDE: 0.75 V
MDC IDC PG IMPLANT DT: 20170508
MDC IDC SET LEADCHNL LV PACING AMPLITUDE: 3.125
MDC IDC SET LEADCHNL RV PACING PULSEWIDTH: 0.5 ms
MDC IDC SET LEADCHNL RV SENSING SENSITIVITY: 2 mV
MDC IDC STAT BRADY RA PERCENT PACED: 49 %
Pulse Gen Serial Number: 7306740

## 2016-10-31 ENCOUNTER — Other Ambulatory Visit: Payer: Self-pay | Admitting: Cardiology

## 2016-11-02 ENCOUNTER — Telehealth: Payer: Self-pay

## 2016-11-02 ENCOUNTER — Ambulatory Visit (INDEPENDENT_AMBULATORY_CARE_PROVIDER_SITE_OTHER): Payer: Medicare HMO

## 2016-11-02 DIAGNOSIS — I5022 Chronic systolic (congestive) heart failure: Secondary | ICD-10-CM

## 2016-11-02 DIAGNOSIS — Z95810 Presence of automatic (implantable) cardiac defibrillator: Secondary | ICD-10-CM

## 2016-11-02 NOTE — Telephone Encounter (Signed)
REFILL 

## 2016-11-02 NOTE — Telephone Encounter (Signed)
Remote ICM transmission received.  Attempted patient call and left detailed message regarding transmission and next ICM scheduled for 12/03/2016.  Advised to return call for any fluid symptoms or questions.    

## 2016-11-02 NOTE — Progress Notes (Signed)
EPIC Encounter for ICM Monitoring  Patient Name: Boston ServiceFabio A Tosh is a 76 y.o. male Date: 11/02/2016 Primary Care Physican: Cheron SchaumannVelazquez, Gretchen Y., MD Primary Cardiologist:Crenshaw Electrophysiologist: [name] Bi-V Pacing: >99% DryWeight:unknown      Attempted call to patient and unable to reach.  Left detailed message regarding transmission.  Transmission reviewed.    Thoracic impedance normal but was abnormal suggesting fluid accumulation from 10/17/2016 to 10/24/2016.  Prescribed dosage: Furosemide 40 mg 1.5 tablets (60 mg total) daily. Potassium 20 mEq 1 tablet daily.  Recommendations: Left voice mail with ICM number and encouraged to call for fluid symptoms.  Follow-up plan: ICM clinic phone appointment on 12/03/2016.   Copy of ICM check sent to device physician.   3 month ICM trend: 11/02/2016   1 Year ICM trend:      Karie SodaLaurie S Airiel Oblinger, RN 11/02/2016 1:29 PM

## 2016-12-03 ENCOUNTER — Telehealth: Payer: Self-pay | Admitting: Cardiology

## 2016-12-03 ENCOUNTER — Ambulatory Visit (INDEPENDENT_AMBULATORY_CARE_PROVIDER_SITE_OTHER): Payer: Medicare HMO

## 2016-12-03 DIAGNOSIS — Z95810 Presence of automatic (implantable) cardiac defibrillator: Secondary | ICD-10-CM | POA: Diagnosis not present

## 2016-12-03 DIAGNOSIS — I5022 Chronic systolic (congestive) heart failure: Secondary | ICD-10-CM

## 2016-12-03 NOTE — Telephone Encounter (Signed)
LMOVM reminding pt to send remote transmission.   

## 2016-12-04 NOTE — Progress Notes (Signed)
EPIC Encounter for ICM Monitoring  Patient Name: Dave Daniels is a 76 y.o. male Date: 12/04/2016 Primary Care Physican: Cheron Schaumann., MD Primary Cardiologist:Crenshaw Electrophysiologist: [name] Bi-V Pacing: >99% DryWeight:unknown        Heart Failure questions reviewed, pt asymptomatic    Thoracic impedance normal but was abnormal suggesting fluid accumulation from 11/29/2016 to 12/03/2016 and 11/11/2016 to 11/17/2016.  Patient did not take Furosemide x 3 days in the last week because he was too busy  Prescribed dosage: Furosemide 40 mg 1.5 tablets (60 mg total) daily. Potassium 20 mEq 1 tablet daily.  Recommendations: No changes.   Encouraged to call for fluid symptoms.  Follow-up plan: ICM clinic phone appointment on 01/04/2017.   Copy of ICM check sent to Dr. Johney Frame.   3 month ICM trend: 12/04/2016   1 Year ICM trend:      Karie Soda, RN 12/04/2016 8:48 AM

## 2017-01-04 ENCOUNTER — Ambulatory Visit (INDEPENDENT_AMBULATORY_CARE_PROVIDER_SITE_OTHER): Payer: Medicare HMO | Admitting: *Deleted

## 2017-01-04 ENCOUNTER — Telehealth: Payer: Self-pay | Admitting: Cardiology

## 2017-01-04 DIAGNOSIS — Z95810 Presence of automatic (implantable) cardiac defibrillator: Secondary | ICD-10-CM

## 2017-01-04 DIAGNOSIS — I5022 Chronic systolic (congestive) heart failure: Secondary | ICD-10-CM | POA: Diagnosis not present

## 2017-01-04 DIAGNOSIS — I255 Ischemic cardiomyopathy: Secondary | ICD-10-CM

## 2017-01-04 NOTE — Telephone Encounter (Signed)
Spoke with pt and reminded pt of remote transmission that is due today. Pt verbalized understanding.   

## 2017-01-05 NOTE — Progress Notes (Signed)
EPIC Encounter for ICM Monitoring  Patient Name: Dave Daniels is a 76 y.o. male Date: 01/05/2017 Primary Care Physican: Loraine Leriche., MD Primary Cardiologist:Crenshaw Electrophysiologist: [name] Bi-V Pacing: >99% DryWeight:unknown      Heart Failure questions reviewed, pt asymptomatic.  Patient has monitor in living room behind a chair.  Advised to move his monitor beside of bed so the remote comes automatically when scheduled.   Thoracic impedance just starting to trend below baseline.     Prescribed dosage: Furosemide 40 mg 1.5 tablets (60 mg total) daily. Potassium 20 mEq 1 tablet daily.  Labs: 07/21/2016 Creatinine 1.64, BUN 27, Potassium 5.3, Sodium 147, EGFR 41-50 Care Everywhere  01/20/2016 Creatinine 1.74, BUN 38, Potassium 5.2, Sodium 147, EGFR 38-47 Care Everywhere   Recommendations: No changes.  Advised to limit salt intake to 2000 mg/day and fluid intake to < 2 liters/day.  Encouraged to call for fluid symptoms.  Follow-up plan: ICM clinic phone appointment on 02/05/2017.  Office appointment scheduled 03/15/2017 with Dr. Rayann Heman.  Advised last office visit with Dr Stanford Breed was 10/24/2014 and encouraged him to call to schedule an appointment.  Copy of ICM check sent to Dr. Rayann Heman and Dr. Stanford Breed.   3 month ICM trend: 01/05/2017   1 Year ICM trend:      Rosalene Billings, RN 01/05/2017 10:36 AM

## 2017-01-05 NOTE — Progress Notes (Signed)
Remote ICD transmission.   

## 2017-01-07 ENCOUNTER — Encounter: Payer: Self-pay | Admitting: Cardiology

## 2017-01-08 LAB — CUP PACEART REMOTE DEVICE CHECK
Battery Remaining Longevity: 55 mo
Battery Remaining Percentage: 72 %
Battery Voltage: 2.95 V
Brady Statistic AP VP Percent: 48 %
Brady Statistic AS VP Percent: 51 %
Brady Statistic RA Percent Paced: 48 %
HIGH POWER IMPEDANCE MEASURED VALUE: 68 Ohm
HighPow Impedance: 68 Ohm
Implantable Lead Implant Date: 20121219
Implantable Lead Implant Date: 20121219
Implantable Lead Location: 753859
Lead Channel Impedance Value: 360 Ohm
Lead Channel Pacing Threshold Amplitude: 0.5 V
Lead Channel Pacing Threshold Amplitude: 0.625 V
Lead Channel Pacing Threshold Amplitude: 1.875 V
Lead Channel Pacing Threshold Pulse Width: 0.5 ms
Lead Channel Sensing Intrinsic Amplitude: 12 mV
Lead Channel Setting Pacing Amplitude: 2 V
Lead Channel Setting Pacing Amplitude: 2.875
Lead Channel Setting Pacing Pulse Width: 0.6 ms
Lead Channel Setting Sensing Sensitivity: 2 mV
MDC IDC LEAD IMPLANT DT: 20121219
MDC IDC LEAD LOCATION: 753858
MDC IDC LEAD LOCATION: 753860
MDC IDC MSMT LEADCHNL LV IMPEDANCE VALUE: 840 Ohm
MDC IDC MSMT LEADCHNL LV PACING THRESHOLD PULSEWIDTH: 0.6 ms
MDC IDC MSMT LEADCHNL RA IMPEDANCE VALUE: 430 Ohm
MDC IDC MSMT LEADCHNL RA PACING THRESHOLD PULSEWIDTH: 0.5 ms
MDC IDC MSMT LEADCHNL RA SENSING INTR AMPL: 4.3 mV
MDC IDC PG IMPLANT DT: 20170508
MDC IDC PG SERIAL: 7306740
MDC IDC SESS DTM: 20180924192914
MDC IDC SET LEADCHNL RA PACING AMPLITUDE: 1.5 V
MDC IDC SET LEADCHNL RV PACING PULSEWIDTH: 0.5 ms
MDC IDC STAT BRADY AP VS PERCENT: 1 %
MDC IDC STAT BRADY AS VS PERCENT: 1 %

## 2017-02-05 ENCOUNTER — Ambulatory Visit (INDEPENDENT_AMBULATORY_CARE_PROVIDER_SITE_OTHER): Payer: Medicare HMO

## 2017-02-05 ENCOUNTER — Telehealth: Payer: Self-pay | Admitting: Cardiology

## 2017-02-05 DIAGNOSIS — I5022 Chronic systolic (congestive) heart failure: Secondary | ICD-10-CM | POA: Diagnosis not present

## 2017-02-05 DIAGNOSIS — Z95810 Presence of automatic (implantable) cardiac defibrillator: Secondary | ICD-10-CM

## 2017-02-05 NOTE — Progress Notes (Signed)
EPIC Encounter for ICM Monitoring  Patient Name: SMITH POTENZA is a 76 y.o. male Date: 02/05/2017 Primary Care Physican: Loraine Leriche., MD Primary Cardiologist:Crenshaw Electrophysiologist: [name] Dry Weight: 160 lbs  Bi-V Pacing:   >99%       Heart Failure questions reviewed, pt asymptomatic.   Thoracic impedance normal but was abnormal suggesting fluid accumulation 01/26/2017 to 02/02/2017.  Furosemide was on hold for 2-3 days due to gout.   Prescribed dosage: Furosemide 40 mg 1.5 tablets (60 mg total) daily. Potassium 20 mEq 1 tablet daily.  Labs: 07/21/2016 Creatinine 1.64, BUN 27, Potassium 5.3, Sodium 147, EGFR 41-50 Care Everywhere  01/20/2016 Creatinine 1.74, BUN 38, Potassium 5.2, Sodium 147, EGFR 38-47 Care Everywhere   Recommendations: No changes.    Encouraged to call for fluid symptoms.  Follow-up plan: ICM clinic phone appointment on 03/09/2017 and requested he send a transmission that morning.  Office appointment scheduled 03/15/2017 with Dr. Rayann Heman.  Copy of ICM check sent to Dr. Rayann Heman.   3 month ICM trend: 02/05/2017   1 Year ICM trend:      Rosalene Billings, RN 02/05/2017 2:12 PM

## 2017-02-05 NOTE — Telephone Encounter (Signed)
Spoke with pt and reminded pt of remote transmission that is due today. Pt verbalized understanding.   

## 2017-03-09 ENCOUNTER — Ambulatory Visit (INDEPENDENT_AMBULATORY_CARE_PROVIDER_SITE_OTHER): Payer: Medicare HMO

## 2017-03-09 ENCOUNTER — Telehealth: Payer: Self-pay | Admitting: Cardiology

## 2017-03-09 DIAGNOSIS — I5022 Chronic systolic (congestive) heart failure: Secondary | ICD-10-CM | POA: Diagnosis not present

## 2017-03-09 DIAGNOSIS — Z95810 Presence of automatic (implantable) cardiac defibrillator: Secondary | ICD-10-CM

## 2017-03-09 NOTE — Telephone Encounter (Signed)
Spoke with pt and reminded pt of remote transmission that is due today. Pt verbalized understanding.   

## 2017-03-11 NOTE — Progress Notes (Signed)
EPIC Encounter for ICM Monitoring  Patient Name: Dave Daniels is a 76 y.o. male Date: 03/11/2017 Primary Care Physican: Loraine Leriche., MD Primary Cardiologist:Crenshaw Electrophysiologist: [name] Dry Weight:    Previous weight 160 lbs  Bi-V Pacing:   >99%         Attempted call to patient and unable to reach.  Left detailed message regarding transmission.  Transmission reviewed.    Thoracic impedance normal but was abnormal suggesting fluid accumulation from 02/17/2017 to 03/01/2017.  Prescribed dosage: Furosemide 40 mg 1.5 tablets (60 mg total) daily. Potassium 20 mEq 1 tablet daily.  Labs: 07/21/2016 Creatinine 1.64, BUN 27, Potassium 5.3, Sodium 147, EGFR 41-50 Care Everywhere  10/09/2017Creatinine 1.74, BUN 38, Potassium 5.2, Sodium 147, EGFR 38-47 Care Everywhere   Recommendations:  Left voice mail with ICM number and encouraged to call if experiencing any fluid symptoms.  Follow-up plan: ICM clinic phone appointment on 04/15/2017.  Office appointment scheduled 03/15/2017 with Dr. Rayann Heman.  Copy of ICM check sent to Dr. Rayann Heman.   3 month ICM trend: 03/09/2017    1 Year ICM trend:       Rosalene Billings, RN 03/11/2017 1:39 PM

## 2017-03-15 ENCOUNTER — Ambulatory Visit: Payer: Medicare HMO | Admitting: Internal Medicine

## 2017-03-15 VITALS — BP 124/80 | HR 60 | Ht 67.0 in | Wt 162.2 lb

## 2017-03-15 DIAGNOSIS — I5022 Chronic systolic (congestive) heart failure: Secondary | ICD-10-CM

## 2017-03-15 LAB — CUP PACEART INCLINIC DEVICE CHECK
Date Time Interrogation Session: 20181203145235
HighPow Impedance: 69.75 Ohm
Implantable Lead Implant Date: 20121219
Implantable Lead Location: 753858
Implantable Lead Location: 753859
Implantable Lead Location: 753860
Implantable Pulse Generator Implant Date: 20170508
Lead Channel Impedance Value: 387.5 Ohm
Lead Channel Pacing Threshold Amplitude: 0.25 V
Lead Channel Pacing Threshold Amplitude: 0.625 V
Lead Channel Pacing Threshold Amplitude: 2 V
Lead Channel Pacing Threshold Pulse Width: 0.5 ms
Lead Channel Pacing Threshold Pulse Width: 0.5 ms
Lead Channel Pacing Threshold Pulse Width: 0.6 ms
Lead Channel Setting Pacing Amplitude: 2 V
Lead Channel Setting Pacing Pulse Width: 0.5 ms
Lead Channel Setting Pacing Pulse Width: 0.6 ms
MDC IDC LEAD IMPLANT DT: 20121219
MDC IDC LEAD IMPLANT DT: 20121219
MDC IDC MSMT BATTERY REMAINING LONGEVITY: 50 mo
MDC IDC MSMT LEADCHNL LV IMPEDANCE VALUE: 800 Ohm
MDC IDC MSMT LEADCHNL RA IMPEDANCE VALUE: 437.5 Ohm
MDC IDC MSMT LEADCHNL RA PACING THRESHOLD AMPLITUDE: 0.25 V
MDC IDC MSMT LEADCHNL RA SENSING INTR AMPL: 4.7 mV
MDC IDC MSMT LEADCHNL RV PACING THRESHOLD PULSEWIDTH: 0.5 ms
MDC IDC SET LEADCHNL LV PACING AMPLITUDE: 3 V
MDC IDC SET LEADCHNL RA PACING AMPLITUDE: 1.5 V
MDC IDC SET LEADCHNL RV SENSING SENSITIVITY: 2 mV
MDC IDC STAT BRADY RA PERCENT PACED: 50 %
MDC IDC STAT BRADY RV PERCENT PACED: 99.8 %
Pulse Gen Serial Number: 7306740

## 2017-03-15 NOTE — Patient Instructions (Addendum)
Medication Instructions:  Your physician recommends that you continue on your current medications as directed. Please refer to the Current Medication list given to you today.   Labwork: None ordered   Testing/Procedures: None ordered   Follow-Up: Your physician recommends that you schedule a follow-up appointment in: 2-3 months with Dr Jens Somrenshaw   Remote monitoring is used to monitor your ICD from home. This monitoring reduces the number of office visits required to check your device to one time per year. It allows us to keep an eye on the functioning of your device to ensure it is working properly. You are scheduled for a device check from home on 04/15/17. You may send your transmission at any time that day. If you have a wireless device, the transmission will be sent automatically. After your physician reviews your transmission, you will receive a postcard with your next transmission date.    Your physician wants you to follow-up in: 12 months with Gypsy BalsamAmber Seiler, NP You will receive a reminder letter in the mail two months in advance. If you don't receive a letter, please call our office to schedule the follow-up appointment.    Any Other Special Instructions Will Be Listed Below (If Applicable).     If you need a refill on your cardiac medications before your next appointment, please call your pharmacy.

## 2017-03-15 NOTE — Progress Notes (Signed)
PCP: Cheron SchaumannVelazquez, Gretchen Y., MD Primary Cardiologist:  Dr Jens Somrenshaw Primary EP: Dr Johney FrameAllred  [place] ServiceFabio A Daniels is a 76 y.o. male who presents today for routine electrophysiology followup.  Since last being seen in our clinic, the patient reports doing very well.  Today, he denies symptoms of palpitations, chest pain, shortness of breath,  lower extremity edema, dizziness, presyncope, syncope, or ICD shocks.  The patient is otherwise without complaint today.   Past Medical History:  Diagnosis Date  . CAD (coronary artery disease)    [place], [place] [facility].  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  Marland Kitchen. Complete heart block (HCC)   . Gout   . Hyperlipidemia   . Hypertension   . Ischemic cardiomyopathy    EF 15% by echo 11/2010 and still 20-25% by follow-up echo 02/2011, s/p St. Jude Bi-V ICD implantation 04/01/11  . LBBB (left bundle branch block)   . Renal insufficiency    Cr 1.6 on 03/25/11   Past Surgical History:  Procedure Laterality Date  . BI-VENTRICULAR IMPLANTABLE CARDIOVERTER DEFIBRILLATOR N/A 04/01/2011   Procedure: BI-VENTRICULAR IMPLANTABLE CARDIOVERTER DEFIBRILLATOR  (CRT-D);  Surgeon: Marinus MawGregg W Taylor, MD;  [facility];  Service: Cardiovascular;  Laterality: N/A;  . CARDIAC DEFIBRILLATOR PLACEMENT  12/12   BiV ICD (SJM) implanted by Dr Johney FrameAllred  . Carpel tunnel surgery    . CORONARY ARTERY BYPASS GRAFT  3/12   [place]  . EP IMPLANTABLE DEVICE N/A 08/19/2015   Procedure: BIV ICD Generator Changeout;  Surgeon: Hillis RangeJames Trenise Turay, MD;  [facility];  Service: Cardiovascular;  Laterality: N/A;  . INGUINAL HERNIA REPAIR    . TONSILLECTOMY    . UMBILICAL HERNIA REPAIR      ROS- all systems are reviewed and negative except as per HPI above  Current Outpatient Medications  Medication Sig Dispense Refill  . aspirin 81 MG tablet Take 81 mg by mouth every other day.     . carvedilol (COREG) 12.5 MG tablet TAKE 1 TABLET BY  MOUTH TWICE DAILY WITH MEALS 180 tablet 1  . colchicine 0.6 MG tablet Take 0.6 mg by mouth daily as needed. Gout    . febuxostat (ULORIC) 40 MG tablet Take 40 mg by mouth daily.    . furosemide (LASIX) 40 MG tablet TAKE ONE & ONE-HALF TABLETS BY MOUTH ONCE DAILY 135 tablet 1  . indomethacin (INDOCIN) 50 MG capsule Take 1 capsule by mouth 2 (two) times daily as needed. Gout    . losartan (COZAAR) 25 MG tablet Take 25 mg by mouth daily.   3  . potassium chloride SA (K-DUR,KLOR-CON) 20 MEQ tablet Take 1 tablet (20 mEq total) by mouth daily. 30 tablet 11  . pravastatin (PRAVACHOL) 80 MG tablet Take 80 mg by mouth every other day.      No current facility-administered medications for this visit.     Physical Exam: Vitals:   03/15/17 1120  BP: 124/80  Pulse: 60  SpO2: 98%  Weight: 162 lb 3.2 oz (73.6 kg)  Height: 5\' 7"  (1.702 m)    GEN- The patient is well appearing, alert and oriented x 3 today.   Head- normocephalic, atraumatic Eyes-  Sclera clear, conjunctiva pink Ears- hearing intact Oropharynx- clear Lungs- Clear to ausculation bilaterally, normal work of breathing Chest- ICD pocket is well healed Heart- Regular rate and rhythm, 1/6 diastolic murmur LUSB GI- soft, NT, ND, + BS Extremities- no clubbing, cyanosis, or  edema  ICD interrogation- reviewed in detail today,  See PACEART report  ekg tracing ordered today is personally reviewed and shows AV paced rhythm  Assessment and Plan:  1.  Chronic systolic dysfunction euvolemic today Stable on an appropriate medical regimen May be a candidate for entresto.  I have encouraged him to follow-up with Dr Jens Somrenshaw for this discussion (overdue) Normal ICD function See Arita MissPace Art report No changes today Followed [facility]aurie [facility]  [place]  Return to see EP NP in a year Follow-up with Dr Jens Somrenshaw as scheduled   Hillis RangeJames Twylia Oka MD, [facility] 03/15/2017 11:31 AM

## 2017-04-09 ENCOUNTER — Other Ambulatory Visit: Payer: Self-pay | Admitting: Internal Medicine

## 2017-04-15 ENCOUNTER — Ambulatory Visit (INDEPENDENT_AMBULATORY_CARE_PROVIDER_SITE_OTHER): Payer: Medicare HMO | Admitting: *Deleted

## 2017-04-15 ENCOUNTER — Telehealth: Payer: Self-pay | Admitting: Cardiology

## 2017-04-15 DIAGNOSIS — Z95810 Presence of automatic (implantable) cardiac defibrillator: Secondary | ICD-10-CM | POA: Diagnosis not present

## 2017-04-15 DIAGNOSIS — I255 Ischemic cardiomyopathy: Secondary | ICD-10-CM

## 2017-04-15 DIAGNOSIS — I5022 Chronic systolic (congestive) heart failure: Secondary | ICD-10-CM

## 2017-04-15 NOTE — Telephone Encounter (Signed)
Spoke with pt and reminded pt of remote transmission that is due today. Pt verbalized understanding.   

## 2017-04-16 ENCOUNTER — Encounter: Payer: Self-pay | Admitting: Cardiology

## 2017-04-16 NOTE — Progress Notes (Signed)
Remote ICD transmission.   

## 2017-04-16 NOTE — Progress Notes (Signed)
EPIC Encounter for ICM Monitoring  Patient Name: Dave Daniels is a 77 y.o. male Date: 04/16/2017 Primary Care Physican: Loraine Leriche., MD Primary Cardiologist:Crenshaw Electrophysiologist: [name] Dry Weight:159lbs  Bi-V Pacing:>99%       Heart Failure questions reviewed, pt asymptomatic and stated he is feeling good.   Thoracic impedance normal.  Prescribed dosage: Furosemide 40 mg 1.5 tablets (60 mg total) daily. Potassium 20 mEq 1 tablet daily.  Labs: 07/21/2016 Creatinine 1.64, BUN 27, Potassium 5.3, Sodium 147, EGFR 41-50 Care Everywhere  10/09/2017Creatinine 1.74, BUN 38, Potassium 5.2, Sodium 147, EGFR 38-47 Care Everywhere  Recommendations: No changes.   Encouraged to call for fluid symptoms.  Follow-up plan: ICM clinic phone appointment on 05/17/2017.    Copy of ICM check sent to Dr. Rayann Heman.   3 month ICM trend: 04/15/2017    1 Year ICM trend:       Rosalene Billings, RN 04/16/2017 8:58 AM

## 2017-04-30 LAB — CUP PACEART REMOTE DEVICE CHECK
Battery Remaining Longevity: 58 mo
Battery Remaining Percentage: 68 %
HIGH POWER IMPEDANCE MEASURED VALUE: 66 Ohm
HIGH POWER IMPEDANCE MEASURED VALUE: 66 Ohm
Implantable Lead Implant Date: 20121219
Implantable Lead Implant Date: 20121219
Implantable Lead Location: 753858
Implantable Lead Location: 753860
Lead Channel Impedance Value: 360 Ohm
Lead Channel Pacing Threshold Amplitude: 0.5 V
Lead Channel Pacing Threshold Pulse Width: 0.6 ms
Lead Channel Setting Pacing Amplitude: 2 V
Lead Channel Setting Pacing Pulse Width: 0.6 ms
MDC IDC LEAD IMPLANT DT: 20121219
MDC IDC LEAD LOCATION: 753859
MDC IDC MSMT BATTERY VOLTAGE: 2.95 V
MDC IDC MSMT LEADCHNL LV IMPEDANCE VALUE: 730 Ohm
MDC IDC MSMT LEADCHNL LV PACING THRESHOLD AMPLITUDE: 2.375 V
MDC IDC MSMT LEADCHNL RA IMPEDANCE VALUE: 400 Ohm
MDC IDC MSMT LEADCHNL RA PACING THRESHOLD PULSEWIDTH: 0.5 ms
MDC IDC MSMT LEADCHNL RA SENSING INTR AMPL: 4.6 mV
MDC IDC MSMT LEADCHNL RV PACING THRESHOLD AMPLITUDE: 0.625 V
MDC IDC MSMT LEADCHNL RV PACING THRESHOLD PULSEWIDTH: 0.5 ms
MDC IDC MSMT LEADCHNL RV SENSING INTR AMPL: 12 mV
MDC IDC PG IMPLANT DT: 20170508
MDC IDC SESS DTM: 20190103171813
MDC IDC SET LEADCHNL LV PACING AMPLITUDE: 2.875
MDC IDC SET LEADCHNL RA PACING AMPLITUDE: 1.5 V
MDC IDC SET LEADCHNL RV PACING PULSEWIDTH: 0.5 ms
MDC IDC SET LEADCHNL RV SENSING SENSITIVITY: 2 mV
MDC IDC STAT BRADY AP VP PERCENT: 49 %
MDC IDC STAT BRADY AP VS PERCENT: 1 %
MDC IDC STAT BRADY AS VP PERCENT: 51 %
MDC IDC STAT BRADY AS VS PERCENT: 1 %
MDC IDC STAT BRADY RA PERCENT PACED: 49 %
Pulse Gen Serial Number: 7306740

## 2017-05-17 ENCOUNTER — Ambulatory Visit (INDEPENDENT_AMBULATORY_CARE_PROVIDER_SITE_OTHER): Payer: Medicare HMO

## 2017-05-17 DIAGNOSIS — I5022 Chronic systolic (congestive) heart failure: Secondary | ICD-10-CM

## 2017-05-17 DIAGNOSIS — Z95810 Presence of automatic (implantable) cardiac defibrillator: Secondary | ICD-10-CM

## 2017-05-18 NOTE — Progress Notes (Signed)
EPIC Encounter for ICM Monitoring  Patient Name: Dave Daniels is a 77 y.o. male Date: 05/18/2017 Primary Care Physican: Velazquez, Gretchen Y., MD Primary Cardiologist:Crenshaw Electrophysiologist: [name] Dry Weight:Previous weight 159lbs  Bi-V Pacing:>99%      Heart Failure questions reviewed, pt asymptomatic.  Denied any fluid symptoms during decreased impedance.  He did skip 2 doses of Furosemide during that time.    Thoracic impedance normal but was abnormal suggesting fluid accumulation from 05/02/2017 to 05/11/2017.  Prescribed dosage: Furosemide 40 mg 1.5 tablets (60 mg total) daily. Potassium 20 mEq 1 tablet daily.  Labs: 07/21/2016 Creatinine 1.64, BUN 27, Potassium 5.3, Sodium 147, EGFR 41-50 Care Everywhere  10/09/2017Creatinine 1.74, BUN 38, Potassium 5.2, Sodium 147, EGFR 38-47 Care Everywhere  Recommendations: No changes.    Encouraged to call for fluid symptoms.  Follow-up plan: ICM clinic phone appointment on 06/18/2017.  Office appointment scheduled 06/23/2017 with Dr. Crenshaw.  Copy of ICM check sent to Dr [name].   3 month ICM trend: 05/18/2017    1 Year ICM trend:       Laurie S Short, RN 05/18/2017 10:35 AM   

## 2017-06-16 ENCOUNTER — Encounter: Payer: Self-pay | Admitting: Cardiology

## 2017-06-18 ENCOUNTER — Ambulatory Visit (INDEPENDENT_AMBULATORY_CARE_PROVIDER_SITE_OTHER): Payer: Medicare HMO

## 2017-06-18 ENCOUNTER — Telehealth: Payer: Self-pay | Admitting: Cardiology

## 2017-06-18 DIAGNOSIS — Z95810 Presence of automatic (implantable) cardiac defibrillator: Secondary | ICD-10-CM | POA: Diagnosis not present

## 2017-06-18 DIAGNOSIS — I5022 Chronic systolic (congestive) heart failure: Secondary | ICD-10-CM

## 2017-06-18 NOTE — Progress Notes (Signed)
EPIC Encounter for ICM Monitoring  Patient Name: Dave Daniels is a 77 y.o. male Date: 06/18/2017 Primary Care Physican: Loraine Leriche., MD Primary Cardiologist:Crenshaw Electrophysiologist: [name] Dry Weight:159lbs  Bi-V Pacing:>99%             Heart Failure questions reviewed, pt asymptomatic.   Thoracic impedance normal but was abnormal suggesting fluid accumulation from 06/02/2017 through 06/09/2017.  Prescribed dosage: Furosemide 40 mg 1.5 tablets (60 mg total) daily. Potassium 20 mEq 1 tablet daily.  Labs: 07/21/2016 Creatinine 1.64, BUN 27, Potassium 5.3, Sodium 147, EGFR 41-50 Care Everywhere  10/09/2017Creatinine 1.74, BUN 38, Potassium 5.2, Sodium 147, EGFR 38-47 Care Everywhere  Recommendations: No changes.   Encouraged to call for fluid symptoms.  Follow-up plan: ICM clinic phone appointment on 07/20/2017.    Copy of ICM check sent to Dr. Rayann Heman.   3 month ICM trend: 06/18/2017    1 Year ICM trend:       Rosalene Billings, RN 06/18/2017 12:37 PM

## 2017-06-18 NOTE — Telephone Encounter (Signed)
Spoke with pt and reminded pt of remote transmission that is due today. Pt verbalized understanding.   

## 2017-06-21 NOTE — Progress Notes (Signed)
HPI: FU coronary artery disease status post coronary artery bypass and graft as well as ischemic cardiomyopathy. Patient's cardiac history dates back to 2010 when he had his first myocardial infarction. [place]. He had a second myocardial infarction in March of 2012 and then had coronary artery bypass and graft. Cardiac catheterization was performed in August of 2012. Ejection fraction was 20%. The right coronary and LAD were occluded and there was critical circumflex disease. There was a patent saphenous vein graft to the right coronary artery. The LIMA to the LAD was patent. The saphenous vein graft to the obtuse marginal was also patent. Patient had biventricular ICD placed in December of 2012. Last echocardiogram December 2017 showed ejection fraction 25-30%, grade 1 diastolic dysfunction, mild aortic insufficiency and mild left atrial enlargement.  I have not seen since 7/16; since that time, the patient denies any dyspnea on exertion, orthopnea, PND, pedal edema, palpitations, syncope or chest pain.   Current Outpatient Medications  Medication Sig Dispense Refill  . aspirin 81 MG tablet Take 81 mg by mouth every other day.     . carvedilol (COREG) 12.5 MG tablet TAKE 1 TABLET BY MOUTH TWICE DAILY WITH MEALS 180 tablet 1  . colchicine 0.6 MG tablet Take 0.6 mg by mouth daily as needed. Gout    . febuxostat (ULORIC) 40 MG tablet Take 40 mg by mouth daily.    . furosemide (LASIX) 40 MG tablet TAKE 1 & 1/2 (ONE & ONE-HALF) TABLETS BY MOUTH ONCE DAILY 135 tablet 1  . indomethacin (INDOCIN) 50 MG capsule Take 1 capsule by mouth 2 (two) times daily as needed. Gout    . losartan (COZAAR) 25 MG tablet Take 25 mg by mouth daily.   3  . potassium chloride SA (K-DUR,KLOR-CON) 20 MEQ tablet Take 1 tablet (20 mEq total) by mouth daily. 30 tablet 11   No current facility-administered medications for this visit.      Past Medical History:  Diagnosis Date  . CAD (coronary  artery disease)    [place] in [place], [place] [facility].  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  Marland Kitchen. Complete heart block (HCC)   . Gout   . Hyperlipidemia   . Hypertension   . Ischemic cardiomyopathy    EF 15% by echo 11/2010 and still 20-25% by follow-up echo 02/2011, s/p St. Jude Bi-V ICD implantation 04/01/11  . LBBB (left bundle branch block)   . Renal insufficiency    Cr 1.6 on 03/25/11    Past Surgical History:  Procedure Laterality Date  . BI-VENTRICULAR IMPLANTABLE CARDIOVERTER DEFIBRILLATOR N/A 04/01/2011   Procedure: BI-VENTRICULAR IMPLANTABLE CARDIOVERTER DEFIBRILLATOR  (CRT-D);  Surgeon: Marinus MawGregg W Taylor, MD;  [facility];  Service: Cardiovascular;  Laterality: N/A;  . CARDIAC DEFIBRILLATOR PLACEMENT  12/12   BiV ICD (SJM) implanted by Dr Johney FrameAllred  . Carpel tunnel surgery    . CORONARY ARTERY BYPASS GRAFT  3/12   [place]  . EP IMPLANTABLE DEVICE N/A 08/19/2015   Procedure: BIV ICD Generator Changeout;  Surgeon: Hillis RangeJames [name], MD;  [facility];  Service: Cardiovascular;  Laterality: N/A;  . INGUINAL HERNIA REPAIR    . TONSILLECTOMY    . UMBILICAL HERNIA REPAIR      Social History   Socioeconomic History  . Marital status: Married    Spouse name: Not on file  . Number of children: 4  . Years  of education: Not on file  . Highest education level: Not on file  Social Needs  . Financial resource strain: Not on file  . Food insecurity - worry: Not on file  . Food insecurity - inability: Not on file  . Transportation needs - medical: Not on file  . Transportation needs - non-medical: Not on file  Occupational History    Comment: Retired  Tobacco Use  . Smoking status: Never Smoker  . Smokeless tobacco: Never Used  Substance and Sexual Activity  . Alcohol use: Yes    Comment: occasional  . Drug use: No  . Sexual activity: Yes  Other Topics Concern  . Not on file  Social History Narrative   Lives in [place],  recently moved from [place].  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    Family History  Problem Relation Age of Onset  . Pancreatic cancer Mother     ROS: Arthralgias but no fevers or chills, productive cough, hemoptysis, dysphasia, odynophagia, melena, hematochezia, dysuria, hematuria, rash, seizure activity, orthopnea, PND, pedal edema, claudication. Remaining systems are negative.  Physical Exam: Well-developed well-nourished in no acute distress.  Skin is warm and dry.  HEENT is normal.  Neck is supple.  Chest is clear to auscultation with normal expansion.  Cardiovascular exam is regular rate and rhythm.  Abdominal exam nontender or distended. No masses palpated. Extremities show no edema. neuro grossly intact  A/P  1 coronary artery disease-patient is doing well with no chest pain.  Continue medical therapy with aspirin and statin.  2 prior ICD-followed by electrophysiology.   3 chronic systolic congestive heart failure-patient is euvolemic on examination.  Continue low-sodium diet, fluid restriction and present dose of diuretic.  Potassium and renal function monitored by primary care.  4 hyperlipidemia-continue statin.  Lipids and liver monitored by primary care.  5 hypertension-blood pressure is controlled.  Continue present medications.  6 ischemic cardiomyopathy-continue beta-blocker.  Continue ARB.  I discussed Entresto with patient today.  However he would prefer to continue present regimen as he is completely asymptomatic.  His blood pressure is also borderline and I am concerned about potentially causing hypotension.  Olga Millers, MD

## 2017-06-23 ENCOUNTER — Encounter: Payer: Self-pay | Admitting: Cardiology

## 2017-06-23 ENCOUNTER — Ambulatory Visit: Payer: Medicare HMO | Admitting: Cardiology

## 2017-06-23 VITALS — BP 102/63 | HR 60 | Ht 67.0 in | Wt 163.0 lb

## 2017-06-23 DIAGNOSIS — E7800 Pure hypercholesterolemia, unspecified: Secondary | ICD-10-CM | POA: Diagnosis not present

## 2017-06-23 DIAGNOSIS — I5022 Chronic systolic (congestive) heart failure: Secondary | ICD-10-CM

## 2017-06-23 DIAGNOSIS — I255 Ischemic cardiomyopathy: Secondary | ICD-10-CM

## 2017-06-23 DIAGNOSIS — I2510 Atherosclerotic heart disease of native coronary artery without angina pectoris: Secondary | ICD-10-CM | POA: Diagnosis not present

## 2017-06-23 DIAGNOSIS — I10 Essential (primary) hypertension: Secondary | ICD-10-CM

## 2017-06-23 NOTE — Patient Instructions (Signed)
Medication Instructions:  Your physician recommends that you continue on your current medications as directed. Please refer to the Current Medication list given to you today.  Labwork: -None  Testing/Procedures: -None  Follow-Up: Your physician wants you to follow-up in: 1 year with Dr. Crenshaw. You will receive a reminder letter in the mail two months in advance. If you don't receive a letter, please call our office to schedule the follow-up appointment.   Any Other Special Instructions Will Be Listed Below (If Applicable).     If you need a refill on your cardiac medications before your next appointment, please call your pharmacy.   

## 2017-07-20 ENCOUNTER — Ambulatory Visit (INDEPENDENT_AMBULATORY_CARE_PROVIDER_SITE_OTHER): Payer: Medicare HMO | Admitting: *Deleted

## 2017-07-20 ENCOUNTER — Telehealth: Payer: Self-pay | Admitting: Cardiology

## 2017-07-20 DIAGNOSIS — Z95810 Presence of automatic (implantable) cardiac defibrillator: Secondary | ICD-10-CM | POA: Diagnosis not present

## 2017-07-20 DIAGNOSIS — I255 Ischemic cardiomyopathy: Secondary | ICD-10-CM | POA: Diagnosis not present

## 2017-07-20 DIAGNOSIS — I5022 Chronic systolic (congestive) heart failure: Secondary | ICD-10-CM | POA: Diagnosis not present

## 2017-07-20 NOTE — Progress Notes (Signed)
Remote ICD transmission.   

## 2017-07-20 NOTE — Telephone Encounter (Signed)
Confirmed remote transmission w/ pt wife.   

## 2017-07-22 ENCOUNTER — Encounter: Payer: Self-pay | Admitting: Cardiology

## 2017-07-22 ENCOUNTER — Telehealth: Payer: Self-pay

## 2017-07-22 DIAGNOSIS — G8929 Other chronic pain: Secondary | ICD-10-CM | POA: Insufficient documentation

## 2017-07-22 NOTE — Progress Notes (Signed)
EPIC Encounter for ICM Monitoring  Patient Name: Dave Daniels is a 77 y.o. male Date: 07/22/2017 Primary Care Physican: Loraine Leriche., MD Primary Cardiologist:Crenshaw Electrophysiologist: [name] Dry Weight:Previous weight 159lbs  Bi-V Pacing:>99%       Attempted call to patient and unable to reach.  Left detailed message regarding transmission.  Transmission reviewed.    Thoracic impedance normal but was abnormal suggesting fluid accumulation from 06/24/2017 to 07/08/2017.  Prescribed dosage: Furosemide 40 mg 1.5 tablets (60 mg total) daily. Potassium 20 mEq 1 tablet daily.  Labs: 07/21/2016 Creatinine 1.64, BUN 27, Potassium 5.3, Sodium 147, EGFR 41-50 Care Everywhere  10/09/2017Creatinine 1.74, BUN 38, Potassium 5.2, Sodium 147, EGFR 38-47 Care Everywhere  Recommendations: Left voice mail with ICM number and encouraged to call if experiencing any fluid symptoms.  Follow-up plan: ICM clinic phone appointment on 08/23/2017. .  Copy of ICM check sent to Dr. Rayann Heman.   3 month ICM trend: 07/20/2017    1 Year ICM trend:       Rosalene Billings, RN 07/22/2017 12:20 PM

## 2017-07-22 NOTE — Telephone Encounter (Signed)
Remote ICM transmission received.  Attempted call to patient and left detailed message per DPR regarding transmission and next ICM scheduled for 08/23/2017.  Advised to return call for any fluid symptoms or questions.    

## 2017-08-18 LAB — CUP PACEART REMOTE DEVICE CHECK
Battery Remaining Longevity: 49 mo
Battery Remaining Percentage: 64 %
Brady Statistic AP VP Percent: 49 %
Brady Statistic AP VS Percent: 1 %
Brady Statistic AS VP Percent: 51 %
Brady Statistic AS VS Percent: 1 %
Date Time Interrogation Session: 20190409162356
HighPow Impedance: 72 Ohm
HighPow Impedance: 72 Ohm
Implantable Lead Implant Date: 20121219
Implantable Lead Implant Date: 20121219
Implantable Lead Location: 753858
Implantable Lead Location: 753860
Implantable Pulse Generator Implant Date: 20170508
Lead Channel Impedance Value: 350 Ohm
Lead Channel Impedance Value: 400 Ohm
Lead Channel Impedance Value: 790 Ohm
Lead Channel Pacing Threshold Amplitude: 0.75 V
Lead Channel Pacing Threshold Amplitude: 2.375 V
Lead Channel Pacing Threshold Pulse Width: 0.6 ms
Lead Channel Sensing Intrinsic Amplitude: 12 mV
Lead Channel Setting Pacing Amplitude: 1.5 V
Lead Channel Setting Pacing Amplitude: 2 V
Lead Channel Setting Pacing Pulse Width: 0.5 ms
Lead Channel Setting Sensing Sensitivity: 2 mV
MDC IDC LEAD IMPLANT DT: 20121219
MDC IDC LEAD LOCATION: 753859
MDC IDC MSMT BATTERY VOLTAGE: 2.95 V
MDC IDC MSMT LEADCHNL RA PACING THRESHOLD AMPLITUDE: 0.5 V
MDC IDC MSMT LEADCHNL RA PACING THRESHOLD PULSEWIDTH: 0.5 ms
MDC IDC MSMT LEADCHNL RA SENSING INTR AMPL: 4.1 mV
MDC IDC MSMT LEADCHNL RV PACING THRESHOLD PULSEWIDTH: 0.5 ms
MDC IDC SET LEADCHNL LV PACING AMPLITUDE: 2.875
MDC IDC SET LEADCHNL LV PACING PULSEWIDTH: 0.6 ms
MDC IDC STAT BRADY RA PERCENT PACED: 49 %
Pulse Gen Serial Number: 7306740

## 2017-08-23 ENCOUNTER — Telehealth: Payer: Self-pay

## 2017-08-23 ENCOUNTER — Ambulatory Visit (INDEPENDENT_AMBULATORY_CARE_PROVIDER_SITE_OTHER): Payer: Medicare HMO

## 2017-08-23 DIAGNOSIS — Z95810 Presence of automatic (implantable) cardiac defibrillator: Secondary | ICD-10-CM

## 2017-08-23 DIAGNOSIS — I5022 Chronic systolic (congestive) heart failure: Secondary | ICD-10-CM | POA: Diagnosis not present

## 2017-08-23 NOTE — Progress Notes (Signed)
Dave Encounter for ICM Monitoring  Patient Name: Dave Daniels is a 77 y.o. male Date: 08/23/2017 Primary Care Physican: Loraine Leriche., MD Primary Cardiologist:Crenshaw Electrophysiologist: [name] Dry Weight:158-160lbs range Bi-V Pacing:>99%       Heart Failure questions reviewed, pt asymptomatic.  He leaves tomorrow for a 2 week trip to [place].  He and his wife have family there.    Thoracic impedance normal.  Prescribed dosage: Furosemide 40 mg 1.5 tablets (60 mg total) daily. Potassium 20 mEq 1 tablet daily.  Labs: 07/21/2016 Creatinine 1.64, BUN 27, Potassium 5.3, Sodium 147, EGFR 41-50 Care Everywhere  10/09/2017Creatinine 1.74, BUN 38, Potassium 5.2, Sodium 147, EGFR 38-47 Care Everywhere  Recommendations: No changes.  Encouraged to call for fluid symptoms.  Follow-up plan: ICM clinic phone appointment on 09/23/2017.   Copy of ICM check sent to Dr. Rayann Heman.   3 month ICM trend: 08/23/2017    1 Year ICM trend:       Rosalene Billings, RN 08/23/2017 3:07 PM

## 2017-08-23 NOTE — Telephone Encounter (Signed)
Spoke with pt and reminded pt of remote transmission that is due today. Pt verbalized understanding.   

## 2017-09-23 ENCOUNTER — Ambulatory Visit (INDEPENDENT_AMBULATORY_CARE_PROVIDER_SITE_OTHER): Payer: Medicare HMO

## 2017-09-23 DIAGNOSIS — Z95810 Presence of automatic (implantable) cardiac defibrillator: Secondary | ICD-10-CM | POA: Diagnosis not present

## 2017-09-23 DIAGNOSIS — I5022 Chronic systolic (congestive) heart failure: Secondary | ICD-10-CM | POA: Diagnosis not present

## 2017-09-23 NOTE — Progress Notes (Signed)
EPIC Encounter for ICM Monitoring  Patient Name: Dave Daniels is a 77 y.o. male Date: 09/23/2017 Primary Care Physican: Loraine Leriche., MD Primary Cardiologist:Crenshaw Electrophysiologist: [name] Dry Weight:158-160lbs range Bi-V Pacing:>99%       Heart Failure questions reviewed, pt asymptomatic.   Thoracic impedance normal.  Prescribed dosage: Furosemide 40 mg 1.5 tablets (60 mg total) daily. Potassium 20 mEq 1 tablet daily.  Labs: 07/28/2017 Creatinine 1.72, BUN 38, Potassium 4.4, Sodium 144, EGFR 38       Care Everywhere 07/21/2016 Creatinine 1.64, BUN 27, Potassium 5.3, Sodium 147, EGFR 41-50  Care Everywhere  10/09/2017Creatinine 1.74, BUN 38, Potassium 5.2, Sodium 147, EGFR 38-47  Care Everywhere  Recommendations: No changes.    Encouraged to call for fluid symptoms.  Follow-up plan: ICM clinic phone appointment on 10/25/2017.   Recall appt 03/15/2018 with Chanetta Marshall, NP.  Copy of ICM check sent to Dr. Rayann Heman.   3 month ICM trend: 09/23/2017    1 Year ICM trend:       Rosalene Billings, RN 09/23/2017 12:34 PM

## 2017-10-25 ENCOUNTER — Ambulatory Visit (INDEPENDENT_AMBULATORY_CARE_PROVIDER_SITE_OTHER): Payer: Medicare HMO | Admitting: *Deleted

## 2017-10-25 ENCOUNTER — Telehealth: Payer: Self-pay

## 2017-10-25 ENCOUNTER — Ambulatory Visit (INDEPENDENT_AMBULATORY_CARE_PROVIDER_SITE_OTHER): Payer: Medicare HMO

## 2017-10-25 DIAGNOSIS — I5022 Chronic systolic (congestive) heart failure: Secondary | ICD-10-CM | POA: Diagnosis not present

## 2017-10-25 DIAGNOSIS — Z95810 Presence of automatic (implantable) cardiac defibrillator: Secondary | ICD-10-CM

## 2017-10-25 DIAGNOSIS — I255 Ischemic cardiomyopathy: Secondary | ICD-10-CM | POA: Diagnosis not present

## 2017-10-25 NOTE — Progress Notes (Signed)
EPIC Encounter for ICM Monitoring  Patient Name: ANTOINE VANDERMEULEN is a 77 y.o. male Date: 10/25/2017 Primary Care Physican: Loraine Leriche., MD Primary Cardiologist:Crenshaw Electrophysiologist: [name] Dry Weight:158-160lbs range Bi-V Pacing:>99%       Heart Failure questions reviewed, pt asymptomatic.   Thoracic impedance normal but was abnormal suggesting fluid accumulation from 10/01/2017 - 10/20/2017.  Prescribed dosage: Furosemide 40 mg 1.5 tablets (60 mg total) daily. Potassium 20 mEq 1 tablet daily.  Labs: 07/28/2017 Creatinine 1.72, BUN 38, Potassium 4.4, Sodium 144, EGFR 38       Care Everywhere 07/21/2016 Creatinine 1.64, BUN 27, Potassium 5.3, Sodium 147, EGFR 41-50  Care Everywhere   Recommendations: No changes.  Encouraged to call for fluid symptoms.  Follow-up plan: ICM clinic phone appointment on 11/25/2017.  Advised to send transmission manually since his monitor is not in the same room where he sleeps.  Copy of ICM check sent to Dr. Rayann Heman.   3 month ICM trend: 10/25/2017    1 Year ICM trend:       Rosalene Billings, RN 10/25/2017 12:25 PM

## 2017-10-25 NOTE — Telephone Encounter (Signed)
I spoke with the patient about this.

## 2017-10-26 NOTE — Progress Notes (Signed)
Remote ICD transmission.   

## 2017-10-27 ENCOUNTER — Encounter: Payer: Self-pay | Admitting: Cardiology

## 2017-10-27 LAB — CUP PACEART REMOTE DEVICE CHECK
Battery Voltage: 2.93 V
Brady Statistic AP VP Percent: 49 %
Brady Statistic AS VP Percent: 51 %
Brady Statistic AS VS Percent: 1 %
Date Time Interrogation Session: 20190715154924
HighPow Impedance: 72 Ohm
HighPow Impedance: 72 Ohm
Implantable Lead Implant Date: 20121219
Implantable Lead Location: 753858
Implantable Lead Location: 753859
Lead Channel Pacing Threshold Amplitude: 0.5 V
Lead Channel Pacing Threshold Pulse Width: 0.5 ms
Lead Channel Pacing Threshold Pulse Width: 0.6 ms
Lead Channel Sensing Intrinsic Amplitude: 4.8 mV
Lead Channel Setting Pacing Amplitude: 1.5 V
Lead Channel Setting Pacing Amplitude: 2 V
Lead Channel Setting Pacing Amplitude: 3 V
Lead Channel Setting Pacing Pulse Width: 0.6 ms
MDC IDC LEAD IMPLANT DT: 20121219
MDC IDC LEAD IMPLANT DT: 20121219
MDC IDC LEAD LOCATION: 753860
MDC IDC MSMT BATTERY REMAINING LONGEVITY: 46 mo
MDC IDC MSMT BATTERY REMAINING PERCENTAGE: 60 %
MDC IDC MSMT LEADCHNL LV IMPEDANCE VALUE: 790 Ohm
MDC IDC MSMT LEADCHNL LV PACING THRESHOLD AMPLITUDE: 2.5 V
MDC IDC MSMT LEADCHNL RA IMPEDANCE VALUE: 400 Ohm
MDC IDC MSMT LEADCHNL RA PACING THRESHOLD PULSEWIDTH: 0.5 ms
MDC IDC MSMT LEADCHNL RV IMPEDANCE VALUE: 350 Ohm
MDC IDC MSMT LEADCHNL RV PACING THRESHOLD AMPLITUDE: 0.75 V
MDC IDC MSMT LEADCHNL RV SENSING INTR AMPL: 12 mV
MDC IDC PG IMPLANT DT: 20170508
MDC IDC PG SERIAL: 7306740
MDC IDC SET LEADCHNL RV PACING PULSEWIDTH: 0.5 ms
MDC IDC SET LEADCHNL RV SENSING SENSITIVITY: 2 mV
MDC IDC STAT BRADY AP VS PERCENT: 1 %
MDC IDC STAT BRADY RA PERCENT PACED: 49 %

## 2017-10-28 ENCOUNTER — Telehealth: Payer: Self-pay

## 2017-10-28 NOTE — Telephone Encounter (Signed)
Call returned to patient as requested by voice mail message.  He said he was taking medication for gout and wanted to know would this cause an increase in fluid accumulation.  Advised that medication should not cause fluid retention.  The gout is improving and feeling better.

## 2017-11-11 ENCOUNTER — Telehealth: Payer: Self-pay

## 2017-11-11 NOTE — Telephone Encounter (Signed)
Returned call and clarified when next ICM remote transmission is scheduled.

## 2017-11-12 ENCOUNTER — Other Ambulatory Visit: Payer: Self-pay | Admitting: Cardiology

## 2017-11-24 DIAGNOSIS — R634 Abnormal weight loss: Secondary | ICD-10-CM | POA: Insufficient documentation

## 2017-11-25 ENCOUNTER — Ambulatory Visit (INDEPENDENT_AMBULATORY_CARE_PROVIDER_SITE_OTHER): Payer: Medicare HMO

## 2017-11-25 DIAGNOSIS — I5022 Chronic systolic (congestive) heart failure: Secondary | ICD-10-CM | POA: Diagnosis not present

## 2017-11-25 DIAGNOSIS — Z95810 Presence of automatic (implantable) cardiac defibrillator: Secondary | ICD-10-CM | POA: Diagnosis not present

## 2017-11-25 NOTE — Progress Notes (Signed)
EPIC Encounter for ICM Monitoring  Patient Name: Dave Daniels is a 77 y.o. male Date: 11/25/2017 Primary Care Physican: Loraine Leriche., MD Primary Cardiologist:Crenshaw Electrophysiologist: [name] Dry Weight:152lbs  Bi-V Pacing:>99%      Heart Failure questions reviewed, pt asymptomatic.  He has lost 18-20 pounds unintentionally in last 6 months.  He missed one day of Furosemide in last week.   Thoracic impedance abnormal suggesting fluid accumulation starting 11/19/2017 and almost back at baseline.  Prescribed dosage: Furosemide 40 mg 1.5 tablets (60 mg total) daily. Potassium 20 mEq 1 tablet daily.  Labs: 07/28/2017 Creatinine 1.72, BUN 38, Potassium 4.4, Sodium 144, EGFR 38 Care Everywhere 07/21/2016 Creatinine 1.64, BUN 27, Potassium 5.3, Sodium 147, EGFR 41-50 Care Everywhere   Recommendations: Advised to restrict salt intake.  Encouraged to call for fluid symptoms.  Follow-up plan: ICM clinic phone appointment on 12/06/2017 to recheck fluid levels.    Copy of ICM check sent to Dr. Rayann Heman and Dr Stanford Breed for review and if any recommendations will call back.   3 month ICM trend: 11/25/2017    1 Year ICM trend:       Rosalene Billings, RN 11/25/2017 3:48 PM

## 2017-12-08 NOTE — Progress Notes (Signed)
No ICM remote transmission received for 12/06/2017 and next ICM transmission scheduled for 01/07/2018.

## 2017-12-20 ENCOUNTER — Telehealth: Payer: Self-pay | Admitting: *Deleted

## 2017-12-20 NOTE — Telephone Encounter (Signed)
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 No c/o per patient. I explained to patient what AFL is and that he may need to take an [facility] for CVA prevention. I offered patient an appt in the office for this week to discuss the new dx. Patient states that he is unable to come into the office this week d/t other appointments. Patient stated that his first availability would be 9/19. Appt made w/Donna Noralyn Pick, NP at the AF clinic on 9/19 @ 1530. Directions for the AF clinic, parking code, and direct telephone number provided for patient. Patient verbalized understanding and appreciation of information.

## 2017-12-21 ENCOUNTER — Other Ambulatory Visit: Payer: Self-pay | Admitting: Internal Medicine

## 2017-12-22 DIAGNOSIS — N138 Other obstructive and reflux uropathy: Secondary | ICD-10-CM | POA: Insufficient documentation

## 2017-12-28 ENCOUNTER — Telehealth (HOSPITAL_COMMUNITY): Payer: Self-pay | Admitting: *Deleted

## 2017-12-28 NOTE — Telephone Encounter (Signed)
Uloric carries a Patent attorneyBlack Box Warning for increased risk of death due to CV causes. Spoke with pt to relay this message. He reports previously trying [place] due to issues with his blood count (he could not recall if it affected his RBC or WBC count). Advised pt to contact his PCP to see if they recommend rechallenge with allopurinol or any other alternatives due to increased risk of CV death with Uloric therapy.

## 2017-12-28 NOTE — Telephone Encounter (Signed)
Patient called to afib clinic asking if it would be ok for him to take urolic 40mg  daily due to elevated uric acid levels. Patient states he was reading the insert of the medication and is having a lot of questions if this is safe to take with his medical history. Patient has not been seen in AF clinic  - will send to pharmacist to review and discuss with patient. Pt states [phone] is best number to contact him at.

## 2017-12-30 ENCOUNTER — Encounter (HOSPITAL_COMMUNITY): Payer: Self-pay | Admitting: Nurse Practitioner

## 2017-12-30 ENCOUNTER — Ambulatory Visit (HOSPITAL_COMMUNITY)
Admission: RE | Admit: 2017-12-30 | Discharge: 2017-12-30 | Disposition: A | Discharge status: Home / Self Care | Payer: Medicare HMO | Source: Ambulatory Visit | Attending: Nurse Practitioner | Admitting: Nurse Practitioner

## 2017-12-30 VITALS — BP 124/62 | HR 70 | Ht 67.0 in | Wt 153.4 lb

## 2017-12-30 DIAGNOSIS — Z888 Allergy status to other drugs, medicaments and biological substances status: Secondary | ICD-10-CM | POA: Insufficient documentation

## 2017-12-30 DIAGNOSIS — I129 Hypertensive chronic kidney disease with stage 1 through stage 4 chronic kidney disease, or unspecified chronic kidney disease: Secondary | ICD-10-CM | POA: Insufficient documentation

## 2017-12-30 DIAGNOSIS — I2510 Atherosclerotic heart disease of native coronary artery without angina pectoris: Secondary | ICD-10-CM | POA: Diagnosis not present

## 2017-12-30 DIAGNOSIS — Z79899 Other long term (current) drug therapy: Secondary | ICD-10-CM | POA: Insufficient documentation

## 2017-12-30 DIAGNOSIS — Z955 Presence of coronary angioplasty implant and graft: Secondary | ICD-10-CM | POA: Diagnosis not present

## 2017-12-30 DIAGNOSIS — I4891 Unspecified atrial fibrillation: Secondary | ICD-10-CM | POA: Diagnosis present

## 2017-12-30 DIAGNOSIS — I4892 Unspecified atrial flutter: Secondary | ICD-10-CM | POA: Diagnosis not present

## 2017-12-30 DIAGNOSIS — Z951 Presence of aortocoronary bypass graft: Secondary | ICD-10-CM | POA: Insufficient documentation

## 2017-12-30 DIAGNOSIS — N189 Chronic kidney disease, unspecified: Secondary | ICD-10-CM | POA: Diagnosis not present

## 2017-12-30 DIAGNOSIS — Z886 Allergy status to analgesic agent status: Secondary | ICD-10-CM | POA: Insufficient documentation

## 2017-12-30 DIAGNOSIS — I480 Paroxysmal atrial fibrillation: Secondary | ICD-10-CM | POA: Insufficient documentation

## 2017-12-30 DIAGNOSIS — I255 Ischemic cardiomyopathy: Secondary | ICD-10-CM | POA: Insufficient documentation

## 2017-12-30 DIAGNOSIS — Z800 Family history of malignant neoplasm of digestive organs: Secondary | ICD-10-CM | POA: Diagnosis not present

## 2017-12-30 DIAGNOSIS — E785 Hyperlipidemia, unspecified: Secondary | ICD-10-CM | POA: Diagnosis not present

## 2017-12-30 DIAGNOSIS — Z95810 Presence of automatic (implantable) cardiac defibrillator: Secondary | ICD-10-CM | POA: Diagnosis not present

## 2017-12-30 DIAGNOSIS — Z7982 Long term (current) use of aspirin: Secondary | ICD-10-CM | POA: Insufficient documentation

## 2017-12-30 DIAGNOSIS — I442 Atrioventricular block, complete: Secondary | ICD-10-CM | POA: Insufficient documentation

## 2017-12-30 DIAGNOSIS — I252 Old myocardial infarction: Secondary | ICD-10-CM | POA: Diagnosis not present

## 2017-12-30 DIAGNOSIS — M109 Gout, unspecified: Secondary | ICD-10-CM | POA: Insufficient documentation

## 2017-12-30 MED ORDER — APIXABAN 5 MG PO TABS: 5.0000 mg | ORAL_TABLET | Freq: Two times a day (BID) | ORAL | 0 refills | Status: DC

## 2017-12-30 NOTE — Patient Instructions (Signed)
Start Eliquis 5mg twice a day 

## 2017-12-31 NOTE — Progress Notes (Signed)
Primary Care Physician: Cheron SchaumannVelazquez, Gretchen Y., MD Referring Physician:Dr. Edison NasutiAllred   Dave Daniels is a 77 y.o. male with a h/o CAD, CKD, CHB with ICD for h/o ischemic cardiomyopathy,HTN, the recently was sound to have afib on his device and Dr. Johney FrameAllred suggested f/u in afib clinic to discuss anticoagulation. He is unaware of any irregular heart rhythm.  Today, he denies symptoms of palpitations, chest pain, shortness of breath, orthopnea, PND, lower extremity edema, dizziness, presyncope, syncope, or neurologic sequela. The patient is tolerating medications without difficulties and is otherwise without complaint today.   Past Medical History:  Diagnosis Date  . CAD (coronary artery disease)    [place] in 2010, then [place] [facility].  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  Marland Kitchen. Complete heart block (HCC)   . Gout   . Hyperlipidemia   . Hypertension   . Ischemic cardiomyopathy    EF 15% by echo 11/2010 and still 20-25% by follow-up echo 02/2011, s/p St. Jude Bi-V ICD implantation 04/01/11  . LBBB (left bundle branch block)   . Renal insufficiency    Cr 1.6 on 03/25/11   Past Surgical History:  Procedure Laterality Date  . BI-VENTRICULAR IMPLANTABLE CARDIOVERTER DEFIBRILLATOR N/A 04/01/2011   Procedure: BI-VENTRICULAR IMPLANTABLE CARDIOVERTER DEFIBRILLATOR  (CRT-D);  Surgeon: Marinus MawGregg W Taylor, MD;  [facility];  Service: Cardiovascular;  Laterality: N/A;  . CARDIAC DEFIBRILLATOR PLACEMENT  12/12   BiV ICD (SJM) implanted by Dr Johney FrameAllred  . Carpel tunnel surgery    . CORONARY ARTERY BYPASS GRAFT  3/12   in [place]  . EP IMPLANTABLE DEVICE N/A 08/19/2015   Procedure: BIV ICD Generator Changeout;  Surgeon: Hillis RangeJames [name], MD;  [facility];  Service: Cardiovascular;  Laterality: N/A;  . INGUINAL HERNIA REPAIR    . TONSILLECTOMY    . UMBILICAL HERNIA REPAIR      Current Outpatient Medications  Medication Sig Dispense Refill  . carvedilol  (COREG) 12.5 MG tablet TAKE 1 TABLET BY MOUTH TWICE DAILY WITH MEALS 180 tablet 2  . colchicine 0.6 MG tablet Take 0.6 mg by mouth daily as needed. Gout    . finasteride (PROSCAR) 5 MG tablet Take 5 mg by mouth daily.    . furosemide (LASIX) 40 MG tablet TAKE 1 & 1/2 (ONE & ONE-HALF) TABLETS BY MOUTH ONCE DAILY 135 tablet 1  . glucosamine-chondroitin 500-400 MG tablet Take 1 tablet by mouth daily.    . indomethacin (INDOCIN) 50 MG capsule Take 1 capsule by mouth 2 (two) times daily as needed. Gout    . losartan (COZAAR) 25 MG tablet Take 25 mg by mouth daily.   3  . potassium chloride SA (K-DUR,KLOR-CON) 20 MEQ tablet Take 1 tablet (20 mEq total) by mouth daily. 30 tablet 11  . apixaban (ELIQUIS) 5 MG TABS tablet Take 1 tablet (5 mg total) by mouth 2 (two) times daily. 60 tablet 0   No current facility-administered medications for this encounter.     Allergies  Allergen Reactions  . Statins Hives    Muscle pain & severe hives  . Nsaids Other (See Comments)    Renal insufficiency Renal insuff  . Other     Statins-reductase inhibitor  . Tolmetin     uknown    Social History   Socioeconomic History  . Marital status: Married    Spouse name: Not on file  . Number of children: 4  . Years of education: Not on  file  . 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 Financial resource strain: Not on file  . Food insecurity:    Worry: Not on file    Inability: Not on file  . Transportation needs:    Medical: Not on file    Non-medical: Not on file  Tobacco Use  . Smoking status: Never Smoker  . Smokeless tobacco: Never Used  Substance and Sexual Activity  . Alcohol use: Yes    Comment: occasional  . Drug use: No  . Sexual activity: Yes  Lifestyle  . Physical activity:    Days per week: Not on file    Minutes per session: Not on file  . Stress: Not on file  Relationships  . Social connections:    Talks on phone: Not on  file    Gets together: Not on file    Attends religious service: Not on file    Active member of club or organization: Not on file    Attends meetings of clubs or organizations: Not on file    Relationship status: Not on file  . Intimate partner violence:    Fear of current or ex partner: Not on file    Emotionally abused: Not on file    Physically abused: Not on file    Forced sexual activity: Not on file  Other Topics Concern  . 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    Family History  Problem Relation Age of Onset  . Pancreatic cancer Mother     ROS- All systems are reviewed and negative except as per the HPI above  Physical Exam: Vitals:   12/30/17 1543  BP: 124/62  Pulse: 70  Weight: 69.6 kg  Height: 5\' 7"  (1.702 m)   Wt Readings from Last 3 Encounters:  12/30/17 69.6 kg  06/23/17 73.9 kg  03/15/17 73.6 kg    Labs: Lab Results  Component Value Date   NA 143 08/12/2015   K 4.1 08/12/2015   CL 105 08/12/2015   CO2 27 08/12/2015   GLUCOSE 93 08/12/2015   BUN 25 08/12/2015   CREATININE 1.43 (H) 08/12/2015   CALCIUM 9.7 08/12/2015   Lab Results  Component Value Date   INR 1.0 03/25/2011   Lab Results  Component Value Date   CHOL 153 07/13/2012   HDL 41 07/13/2012   LDLCALC 92 07/13/2012   TRIG 102 07/13/2012     GEN- The patient is well appearing, alert and oriented x 3 today.   Head- normocephalic, atraumatic Eyes-  Sclera clear, conjunctiva pink Ears- hearing intact Oropharynx- clear Neck- supple, no JVP Lymph- no cervical lymphadenopathy Lungs- Clear to ausculation bilaterally, normal work of breathing Heart- Regular rate and rhythm, no murmurs, rubs or gallops, PMI not laterally displaced GI- soft, NT, ND, + BS Extremities- no clubbing, cyanosis, or edema MS- no significant deformity or atrophy Skin- no rash or lesion Psych- euthymic mood, full affect Neuro-  strength and sensation are intact  EKG-v paced rhythm  Paceart report per phone note 9/19 reviewed    Assessment and Plan: 1. Paroxysmal afib/flutter General education re arrhythmia He is asymptomatic Continue carvedilol 12.5 mg bid  2.CHA2DS2VASc score of at least 5 Discussed risk of stroke with presence of afib  Denies bleeding history Discussed options of anticoagulation Pt would like to use eliquis, he will start  5 mg bid  He does have creatinines that run 1.4 to 1.6, but he is less than 80 and his weight is over 60 kg Bleeding precautions discussed  Stop asa   He will return in 3 weeks with cbc/bmet   Lupita Leash C. [facility] [address] [place], [place] [address] [phone]

## 2018-01-07 ENCOUNTER — Ambulatory Visit (INDEPENDENT_AMBULATORY_CARE_PROVIDER_SITE_OTHER): Payer: Medicare HMO

## 2018-01-07 DIAGNOSIS — I5022 Chronic systolic (congestive) heart failure: Secondary | ICD-10-CM | POA: Diagnosis not present

## 2018-01-07 DIAGNOSIS — Z95810 Presence of automatic (implantable) cardiac defibrillator: Secondary | ICD-10-CM | POA: Diagnosis not present

## 2018-01-07 NOTE — Progress Notes (Signed)
EPIC Encounter for ICM Monitoring  Patient Name: Dave Daniels is a 77 y.o. male Date: 01/07/2018 Primary Care Physican: Loraine Leriche., MD Primary Cardiologist:Crenshaw Electrophysiologist: [name] Dry Weight:152lbs  Bi-V Pacing:>99%    AT/AF Burden:  2.3%     Heart Failure questions reviewed, pt asymptomatic.   Thoracic impedance normal.  Prescribed: Furosemide 40 mg 1.5 tablets (60 mg total) daily. Potassium 20 mEq 1 tablet daily.  Labs: 07/28/2017 Creatinine 1.72, BUN 38, Potassium 4.4, Sodium 144, EGFR 38 Care Everywhere 07/21/2016 Creatinine 1.64, BUN 27, Potassium 5.3, Sodium 147, EGFR 41-50 Care Everywhere  Recommendations: No changes.    Encouraged to call for fluid symptoms.  Follow-up plan: ICM clinic phone appointment on 02/07/2018.    Copy of ICM check sent to Dr. Rayann Heman.   3 month ICM trend: 01/07/2018     AT/AF   1 Year ICM trend:       Rosalene Billings, RN 01/07/2018 4:57 PM

## 2018-01-13 NOTE — Progress Notes (Signed)
Received: 2 days ago  Message Contents  Hillis Range, MD  Ginnette Gates, Josephine Igo, RN  Cc: Marily Lente, NP        Guy Sandifer in AF clinic and they decided to not pursue sinus. We just need to make sure CHF doesn't worsen. If it does, we should cardiovert.

## 2018-01-19 ENCOUNTER — Ambulatory Visit (HOSPITAL_COMMUNITY): Payer: Medicare HMO | Admitting: Nurse Practitioner

## 2018-01-20 ENCOUNTER — Encounter (HOSPITAL_COMMUNITY): Payer: Self-pay | Admitting: Nurse Practitioner

## 2018-01-20 ENCOUNTER — Ambulatory Visit (HOSPITAL_COMMUNITY)
Admission: RE | Admit: 2018-01-20 | Discharge: 2018-01-20 | Disposition: A | Discharge status: Home / Self Care | Payer: Medicare HMO | Source: Ambulatory Visit | Attending: Nurse Practitioner | Admitting: Nurse Practitioner

## 2018-01-20 VITALS — BP 140/84 | HR 70 | Ht 67.0 in | Wt 155.0 lb

## 2018-01-20 DIAGNOSIS — I480 Paroxysmal atrial fibrillation: Secondary | ICD-10-CM | POA: Insufficient documentation

## 2018-01-20 DIAGNOSIS — I129 Hypertensive chronic kidney disease with stage 1 through stage 4 chronic kidney disease, or unspecified chronic kidney disease: Secondary | ICD-10-CM | POA: Insufficient documentation

## 2018-01-20 DIAGNOSIS — Z886 Allergy status to analgesic agent status: Secondary | ICD-10-CM | POA: Insufficient documentation

## 2018-01-20 DIAGNOSIS — Z950 Presence of cardiac pacemaker: Secondary | ICD-10-CM | POA: Diagnosis not present

## 2018-01-20 DIAGNOSIS — I2510 Atherosclerotic heart disease of native coronary artery without angina pectoris: Secondary | ICD-10-CM | POA: Insufficient documentation

## 2018-01-20 DIAGNOSIS — R9431 Abnormal electrocardiogram [ECG] [EKG]: Secondary | ICD-10-CM | POA: Diagnosis not present

## 2018-01-20 DIAGNOSIS — M109 Gout, unspecified: Secondary | ICD-10-CM | POA: Diagnosis not present

## 2018-01-20 DIAGNOSIS — I447 Left bundle-branch block, unspecified: Secondary | ICD-10-CM | POA: Insufficient documentation

## 2018-01-20 DIAGNOSIS — Z7901 Long term (current) use of anticoagulants: Secondary | ICD-10-CM | POA: Insufficient documentation

## 2018-01-20 DIAGNOSIS — Z79899 Other long term (current) drug therapy: Secondary | ICD-10-CM | POA: Insufficient documentation

## 2018-01-20 DIAGNOSIS — Z951 Presence of aortocoronary bypass graft: Secondary | ICD-10-CM | POA: Diagnosis not present

## 2018-01-20 DIAGNOSIS — I252 Old myocardial infarction: Secondary | ICD-10-CM | POA: Insufficient documentation

## 2018-01-20 DIAGNOSIS — I4892 Unspecified atrial flutter: Secondary | ICD-10-CM | POA: Diagnosis not present

## 2018-01-20 DIAGNOSIS — I255 Ischemic cardiomyopathy: Secondary | ICD-10-CM | POA: Diagnosis not present

## 2018-01-20 DIAGNOSIS — E785 Hyperlipidemia, unspecified: Secondary | ICD-10-CM | POA: Diagnosis not present

## 2018-01-20 LAB — CBC
HEMATOCRIT: 40.5 % (ref 39.0–52.0)
Hemoglobin: 12.6 g/dL — ABNORMAL LOW (ref 13.0–17.0)
MCH: 26.5 pg (ref 26.0–34.0)
MCHC: 31.1 g/dL (ref 30.0–36.0)
MCV: 85.1 fL (ref 80.0–100.0)
PLATELETS: 242 10*3/uL (ref 150–400)
RBC: 4.76 MIL/uL (ref 4.22–5.81)
RDW: 14.7 % (ref 11.5–15.5)
WBC: 6.4 10*3/uL (ref 4.0–10.5)
nRBC: 0 % (ref 0.0–0.2)

## 2018-01-20 LAB — BASIC METABOLIC PANEL
Anion gap: 9 (ref 5–15)
BUN: 25 mg/dL — ABNORMAL HIGH (ref 8–23)
CALCIUM: 9.4 mg/dL (ref 8.9–10.3)
CO2: 24 mmol/L (ref 22–32)
CREATININE: 1.54 mg/dL — AB (ref 0.61–1.24)
Chloride: 104 mmol/L (ref 98–111)
GFR, EST AFRICAN AMERICAN: 48 mL/min — AB (ref >60)
GFR, EST NON AFRICAN AMERICAN: 42 mL/min — AB (ref >60)
Glucose, Bld: 100 mg/dL — ABNORMAL HIGH (ref 70–99)
Potassium: 3.8 mmol/L (ref 3.5–5.1)
Sodium: 137 mmol/L (ref 135–145)

## 2018-01-20 MED ORDER — APIXABAN 5 MG PO TABS: 5.0000 mg | ORAL_TABLET | Freq: Two times a day (BID) | ORAL | 6 refills | Status: DC

## 2018-01-20 NOTE — Progress Notes (Signed)
Primary Care Physician: Cheron Schaumann., MD Referring Physician:Dr. [name] is a 77 y.o. male with a h/o CAD, CKD, CHB with ICD for h/o ischemic cardiomyopathy,HTN, the recently was sound to have afib on his device and Dr. Johney Frame suggested f/u in afib clinic to discuss anticoagulation. He is unaware of any irregular heart rhythm.  F/u in afib clinic, 10/10. He feels no different since being on anticoagulation with eliquis. No bleeding noted. No irregular heart beat noted.   Today, he denies symptoms of palpitations, chest pain, shortness of breath, orthopnea, PND, lower extremity edema, dizziness, presyncope, syncope, or neurologic sequela. The patient is tolerating medications without difficulties and is otherwise without complaint today.   Past Medical History:  Diagnosis Date  . CAD (coronary artery disease)    MI in [place] with stenting in 2010, then MI with CABG in [facility] 06/2010.  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  Marland Kitchen Complete heart block (HCC)   . Gout   . Hyperlipidemia   . Hypertension   . Ischemic cardiomyopathy    EF 15% by echo 11/2010 and still 20-25% by follow-up echo 02/2011, s/p St. Jude Bi-V ICD implantation 04/01/11  . LBBB (left bundle branch block)   . Renal insufficiency    Cr 1.6 on 03/25/11   Past Surgical History:  Procedure Laterality Date  . BI-VENTRICULAR IMPLANTABLE CARDIOVERTER DEFIBRILLATOR N/A 04/01/2011   Procedure: BI-VENTRICULAR IMPLANTABLE CARDIOVERTER DEFIBRILLATOR  (CRT-D);  Surgeon: Marinus Maw, MD;  Location: [facility] CATH LAB;  Service: Cardiovascular;  Laterality: N/A;  . CARDIAC DEFIBRILLATOR PLACEMENT  12/12   BiV ICD (SJM) implanted by Dr Johney Frame  . Carpel tunnel surgery    . CORONARY ARTERY BYPASS GRAFT  3/12   in [place]  . EP IMPLANTABLE DEVICE N/A 08/19/2015   Procedure: BIV ICD Generator Changeout;  Surgeon: Hillis Range, MD;  Location: [facility] INVASIVE CV LAB;  Service: Cardiovascular;  Laterality: N/A;  . INGUINAL HERNIA  REPAIR    . TONSILLECTOMY    . UMBILICAL HERNIA REPAIR      Current Outpatient Medications  Medication Sig Dispense Refill  . allopurinol (ZYLOPRIM) 300 MG tablet Take 300 mg by mouth daily.  11  . apixaban (ELIQUIS) 5 MG TABS tablet Take 1 tablet (5 mg total) by mouth 2 (two) times daily. 60 tablet 6  . carvedilol (COREG) 12.5 MG tablet TAKE 1 TABLET BY MOUTH TWICE DAILY WITH MEALS 180 tablet 2  . colchicine 0.6 MG tablet Take 0.6 mg by mouth daily as needed. Gout    . finasteride (PROSCAR) 5 MG tablet Take 5 mg by mouth daily.    . furosemide (LASIX) 40 MG tablet TAKE 1 & 1/2 (ONE & ONE-HALF) TABLETS BY MOUTH ONCE DAILY 135 tablet 1  . glucosamine-chondroitin 500-400 MG tablet Take 1 tablet by mouth daily.    . indomethacin (INDOCIN) 50 MG capsule Take 1 capsule by mouth 2 (two) times daily as needed. Gout    . losartan (COZAAR) 25 MG tablet Take 25 mg by mouth daily.   3  . potassium chloride SA (K-DUR,KLOR-CON) 20 MEQ tablet Take 1 tablet (20 mEq total) by mouth daily. 30 tablet 11   No current facility-administered medications for this encounter.     Allergies  Allergen Reactions  . Statins Hives    Muscle pain & severe hives  . Nsaids Other (See Comments)    Renal insufficiency Renal insuff  . Other     Statins-reductase inhibitor  . Tolmetin  uknown    Social History   Socioeconomic History  . Marital status: Married    Spouse name: Not on file  . Number of children: 4  . Years of education: Not on file  . 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 Financial resource strain: Not on file  . Food insecurity:    Worry: Not on file    Inability: Not on file  . Transportation needs:    Medical: Not on file    Non-medical: Not on file  Tobacco Use  . Smoking status: Never Smoker  . Smokeless tobacco: Never Used  Substance and Sexual Activity  . Alcohol use: Yes    Comment: occasional  . Drug use: No  .  Sexual activity: Yes  Lifestyle  . Physical activity:    Days per week: Not on file    Minutes per session: Not on file  . Stress: Not on file  Relationships  . Social connections:    Talks on phone: Not on file    Gets together: Not on file    Attends religious service: Not on file    Active member of club or organization: Not on file    Attends meetings of clubs or organizations: Not on file    Relationship status: Not on file  . Intimate partner violence:    Fear of current or ex partner: Not on file    Emotionally abused: Not on file    Physically abused: Not on file    Forced sexual activity: Not on file  Other Topics Concern  . 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    Family History  Problem Relation Age of Onset  . Pancreatic cancer Mother     ROS- All systems are reviewed and negative except as per the HPI above  Physical Exam: Vitals:   01/20/18 1537  BP: 140/84  Pulse: 70  Weight: 70.3 kg  Height: 5\' 7"  (1.702 m)   Wt Readings from Last 3 Encounters:  01/20/18 70.3 kg  12/30/17 69.6 kg  06/23/17 73.9 kg    Labs: Lab Results  Component Value Date   NA 143 08/12/2015   K 4.1 08/12/2015   CL 105 08/12/2015   CO2 27 08/12/2015   GLUCOSE 93 08/12/2015   BUN 25 08/12/2015   CREATININE 1.43 (H) 08/12/2015   CALCIUM 9.7 08/12/2015   Lab Results  Component Value Date   INR 1.0 03/25/2011   Lab Results  Component Value Date   CHOL 153 07/13/2012   HDL 41 07/13/2012   LDLCALC 92 07/13/2012   TRIG 102 07/13/2012     GEN- The patient is well appearing, alert and oriented x 3 today.   Head- normocephalic, atraumatic Eyes-  Sclera clear, conjunctiva pink Ears- hearing intact Oropharynx- clear Neck- supple, no JVP Lymph- no cervical lymphadenopathy Lungs- Clear to ausculation bilaterally, normal work of breathing Heart- Regular rate and rhythm, no murmurs, rubs or  gallops, PMI not laterally displaced GI- soft, NT, ND, + BS Extremities- no clubbing, cyanosis, or edema MS- no significant deformity or atrophy Skin- no rash or lesion Psych- euthymic mood, full affect Neuro- strength and sensation are intact  EKG-v paced rhythm  Paceart report per phone note 9/19 reviewed    Assessment and Plan: 1. Paroxysmal afib/flutter He is asymptomatic Continue carvedilol 12.5 mg bid  2.CHA2DS2VASc score of at least 5 Doing well on eliquis 5 mg bid Denies bleeding history He does have creatinines that run 1.4 to 1.6, but he is less than 80 and his weight is over 60 kg Bleeding precautions discussed  Off asa  Cbc/bmet today  F/u with Dr. Jens Som Dr. Johney Frame as scheduled afib clinic as needed   [facility] [address] [place], [place] [address] [phone]

## 2018-01-24 ENCOUNTER — Telehealth: Payer: Self-pay | Admitting: Cardiology

## 2018-01-24 ENCOUNTER — Ambulatory Visit (INDEPENDENT_AMBULATORY_CARE_PROVIDER_SITE_OTHER): Payer: Medicare HMO | Admitting: *Deleted

## 2018-01-24 DIAGNOSIS — I255 Ischemic cardiomyopathy: Secondary | ICD-10-CM | POA: Diagnosis not present

## 2018-01-24 NOTE — Telephone Encounter (Signed)
Spoke with pt and reminded pt of remote transmission that is due today. Pt verbalized understanding.   

## 2018-01-25 ENCOUNTER — Ambulatory Visit (INDEPENDENT_AMBULATORY_CARE_PROVIDER_SITE_OTHER): Payer: Medicare HMO

## 2018-01-25 DIAGNOSIS — Z95810 Presence of automatic (implantable) cardiac defibrillator: Secondary | ICD-10-CM

## 2018-01-25 DIAGNOSIS — I5022 Chronic systolic (congestive) heart failure: Secondary | ICD-10-CM

## 2018-01-25 NOTE — Progress Notes (Signed)
EPIC Encounter for ICM Monitoring  Patient Name: Dave Daniels is a 77 y.o. male Date: 01/25/2018 Primary Care Physican: Velazquez, Gretchen Y., MD Primary Cardiologist:Crenshaw Electrophysiologist: [name] Dry Weight:152lbs  Bi-V Pacing:>99%    AT/AF Burden:  4.2% (was 1.9% on 01/03/18 transmission)       Heart Failure questions reviewed, pt asymptomatic. He missed 1-2 doses of Furosemide in last week due to traveling and ate more restaurant meals in the past week.    Thoracic impedance abnormal suggesting fluid accumulation starting 01/17/2018.   Prescribed: Furosemide 40 mg 1.5 tablets (60 mg total) daily. Potassium 20 mEq 1 tablet daily.  Labs: 01/20/2018 Creatinine 1.54. BUN 25, Potassium 3.8, Sodium 137, eGFR 42-48 07/28/2017 Creatinine 1.72, BUN 38, Potassium 4.4, Sodium 144, EGFR 38 Care Everywhere  Recommendations: Advised to limit salt intake over the next few days.  Follow-up plan: ICM clinic phone appointment on 01/28/2018 to recheck fluid levels.       Copy of ICM check sent to Dr. [name] and Dr Crenshaw for review and recommendations if needed.   3 month ICM trend: 01/24/2018    AT/AF    1 Year ICM trend:       Laurie S Short, RN 01/25/2018 11:39 AM   

## 2018-01-25 NOTE — Progress Notes (Signed)
Remote ICD transmission.   

## 2018-01-26 NOTE — Progress Notes (Signed)
HPI: FU coronary artery disease status post coronary artery bypass and graft as well as ischemic cardiomyopathy. Patient's cardiac history dates back to 2010 when he had his first myocardial infarction. He had stents placed in [place]. He had a second myocardial infarction in March of 2012 and then had coronary artery bypass and graft. Cardiac catheterization was performed in August of 2012. Ejection fraction was 20%. The right coronary and LAD were occluded and there was critical circumflex disease. There was a patent saphenous vein graft to the right coronary artery. The LIMA to the LAD was patent. The saphenous vein graft to the obtuse marginal was also patent. Patient had biventricular ICD placed in December of 2012. Last echocardiogram December 2017 showed ejection fraction 25-30%, grade 1 diastolic dysfunction, mild aortic insufficiency and mild left atrial enlargement.  Recently noted to have atrial fibrillation on his device; seen in atrial fibrillation and rate control recommended. However found to have fluid accumulation on recent device check. Since last seen, patient denies dyspnea, chest pain, palpitations or syncope.  Current Outpatient Medications  Medication Sig Dispense Refill  . allopurinol (ZYLOPRIM) 300 MG tablet Take 300 mg by mouth daily.  11  . apixaban (ELIQUIS) 5 MG TABS tablet Take 1 tablet (5 mg total) by mouth 2 (two) times daily. 60 tablet 6  . carvedilol (COREG) 12.5 MG tablet TAKE 1 TABLET BY MOUTH TWICE DAILY WITH MEALS 180 tablet 2  . colchicine 0.6 MG tablet Take 0.6 mg by mouth daily as needed. Gout    . finasteride (PROSCAR) 5 MG tablet Take 5 mg by mouth daily.    . furosemide (LASIX) 40 MG tablet TAKE 1 & 1/2 (ONE & ONE-HALF) TABLETS BY MOUTH ONCE DAILY 135 tablet 1  . indomethacin (INDOCIN) 50 MG capsule Take 1 capsule by mouth 2 (two) times daily as needed. Gout    . losartan (COZAAR) 25 MG tablet Take 25 mg by mouth daily.   3  . potassium chloride SA  (K-DUR,KLOR-CON) 20 MEQ tablet Take 1 tablet (20 mEq total) by mouth daily. 30 tablet 11   No current facility-administered medications for this visit.      Past Medical History:  Diagnosis Date  . CAD (coronary artery disease)    MI in [place] with stenting in 2010, then MI with CABG in [facility] 06/[facility].  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  Marland Kitchen Complete heart block (HCC)   . Gout   . Hyperlipidemia   . Hypertension   . Ischemic cardiomyopathy    EF 15% by echo 11/2010 and still 20-25% by follow-up echo 02/2011, s/p St. Jude Bi-V ICD implantation 04/01/11  . LBBB (left bundle branch block)   . Renal insufficiency    Cr 1.6 on 03/25/11    Past Surgical History:  Procedure Laterality Date  . BI-VENTRICULAR IMPLANTABLE CARDIOVERTER DEFIBRILLATOR N/A 04/01/2011   Procedure: BI-VENTRICULAR IMPLANTABLE CARDIOVERTER DEFIBRILLATOR  (CRT-D);  Surgeon: Marinus Maw, MD;  Location: [facility] CATH LAB;  Service: Cardiovascular;  Laterality: N/A;  . CARDIAC DEFIBRILLATOR PLACEMENT  12/12   BiV ICD (SJM) implanted by Dr Johney Frame  . Carpel tunnel surgery    . CORONARY ARTERY BYPASS GRAFT  3/12   in [place]  . EP IMPLANTABLE DEVICE N/A 08/19/2015   Procedure: BIV ICD Generator Changeout;  Surgeon: Hillis Range, MD;  Location: [facility] INVASIVE CV LAB;  Service: Cardiovascular;  Laterality: N/A;  . INGUINAL HERNIA REPAIR    . TONSILLECTOMY    . UMBILICAL HERNIA REPAIR  Social History   Socioeconomic History  . Marital status: Married    Spouse name: Not on file  . Number of children: 4  . Years of education: Not on file  . 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 Financial resource strain: Not on file  . Food insecurity:    Worry: Not on file    Inability: Not on file  . Transportation needs:    Medical: Not on file    Non-medical: Not on file  Tobacco Use  . Smoking status: Never Smoker  . Smokeless tobacco: Never Used  Substance and Sexual  Activity  . Alcohol use: Yes    Comment: occasional  . Drug use: No  . Sexual activity: Yes  Lifestyle  . Physical activity:    Days per week: Not on file    Minutes per session: Not on file  . Stress: Not on file  Relationships  . Social connections:    Talks on phone: Not on file    Gets together: Not on file    Attends religious service: Not on file    Active member of club or organization: Not on file    Attends meetings of clubs or organizations: Not on file    Relationship status: Not on file  . Intimate partner violence:    Fear of current or ex partner: Not on file    Emotionally abused: Not on file    Physically abused: Not on file    Forced sexual activity: Not on file  Other Topics Concern  . 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    Family History  Problem Relation Age of Onset  . Pancreatic cancer Mother     ROS: no fevers or chills, productive cough, hemoptysis, dysphasia, odynophagia, melena, hematochezia, dysuria, hematuria, rash, seizure activity, orthopnea, PND, pedal edema, claudication. Remaining systems are negative.  Physical Exam: Well-developed well-nourished in no acute distress.  Skin is warm and dry.  HEENT is normal.  Neck is supple.  Chest is clear to auscultation with normal expansion.  Cardiovascular exam is regular rate and rhythm.  Abdominal exam nontender or distended. No masses palpated. Extremities show no edema. neuro grossly intact  A/P  1 persistent atrial fibrillation-patient remains in atrial fibrillation today.  Continue carvedilol for rate control.  Plan had been to consider elective cardioversion.  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  He is off of apixaban at present.  The biopsy is scheduled for tomorrow.  Once this is addressed would resume apixaban.  He is scheduled to see Dr. Johney Frame on December 5.   If he has been on anticoagulation for 3 consecutive weeks at that time cardioversion could be arranged.  If he does not hold sinus rhythm then rate control and anticoagulation may be best option as he is asymptomatic.  2 coronary artery disease status post coronary artery bypass and graft-intolerant to statins.  No aspirin given need for anticoagulation.  3 chronic systolic congestive heart failure-patient is euvolemic on examination.  Continue present dose of Lasix, low-sodium diet and fluid restriction.  4 hypertension-patient's blood pressure is controlled.  Continue present medications.  5 hyperlipidemia-intolerant to statins.  Will consider Repatha in the future.  6 ischemic cardiomyopathy-continue beta-blocker and ARB.  We discussed Sherryll Burger previously but he preferred to  continue with present management.  Olga Millers, MD

## 2018-01-27 ENCOUNTER — Encounter: Payer: Self-pay | Admitting: Cardiology

## 2018-01-28 ENCOUNTER — Ambulatory Visit (INDEPENDENT_AMBULATORY_CARE_PROVIDER_SITE_OTHER): Payer: Medicare HMO

## 2018-01-28 ENCOUNTER — Telehealth: Payer: Self-pay | Admitting: Cardiology

## 2018-01-28 DIAGNOSIS — Z95810 Presence of automatic (implantable) cardiac defibrillator: Secondary | ICD-10-CM

## 2018-01-28 DIAGNOSIS — I5022 Chronic systolic (congestive) heart failure: Secondary | ICD-10-CM

## 2018-01-28 NOTE — Progress Notes (Signed)
EPIC Encounter for ICM Monitoring  Patient Name: Dave Daniels is a 77 y.o. male Date: 01/28/2018 Primary Care Physican: Velazquez, Gretchen Y., MD Primary Cardiologist:Crenshaw Electrophysiologist: [name] Dry Weight:150.4lbs  Bi-V Pacing:>99%      Heart Failure questions reviewed, pts weight decreased by 1.5 lbs.   Thoracic impedance return to normal since last remote transmission on 01/25/2018.   Prescribed: Furosemide 40 mg 1.5 tablets (60 mg total) daily. Potassium 20 mEq 1 tablet daily.  Labs: 01/20/2018 Creatinine 1.54. BUN 25, Potassium 3.8, Sodium 137, eGFR 42-48 07/28/2017 Creatinine 1.72, BUN 38, Potassium 4.4, Sodium 144, EGFR 38 Care Everywhere  Recommendations: No changes.    Encouraged to call for fluid symptoms.  Follow-up plan: ICM clinic phone appointment on 02/07/2018.     Copy of ICM check sent to Dr. [name].   3 month ICM trend: 01/28/2018    AT/AF    1 Year ICM trend:       Laurie S Short, RN 01/28/2018 2:21 PM   

## 2018-01-28 NOTE — Telephone Encounter (Signed)
Spoke with pt and reminded pt of remote transmission that is due today. Pt verbalized understanding.   

## 2018-01-31 ENCOUNTER — Telehealth (HOSPITAL_COMMUNITY): Payer: Self-pay | Admitting: *Deleted

## 2018-01-31 NOTE — Telephone Encounter (Signed)
Patient called stating he needs to have an endoscopic ultrasound and the provider is stating to hold anticoagulation for 5 days prior. He is wanting to know if this is permissible?

## 2018-01-31 NOTE — Telephone Encounter (Signed)
Patient with diagnosis of paroxysmal afib/flutter on Eliquis for anticoagulation.    Procedure: endoscopic ultrasound Date of procedure: TBD  CHADS2-VASc score of  5 (CHF, HTN, AGE, DM2, stroke/tia x 2, CAD, AGE, male)  CrCl 12ml/min  Per office protocol, patient can hold Eliquis for 2 days prior to procedure.

## 2018-01-31 NOTE — Telephone Encounter (Signed)
Pt.notified

## 2018-02-02 ENCOUNTER — Encounter: Payer: Self-pay | Admitting: Cardiology

## 2018-02-02 ENCOUNTER — Ambulatory Visit (INDEPENDENT_AMBULATORY_CARE_PROVIDER_SITE_OTHER): Payer: Medicare HMO | Admitting: Cardiology

## 2018-02-02 VITALS — BP 114/74 | HR 70 | Ht 67.0 in | Wt 153.8 lb

## 2018-02-02 DIAGNOSIS — E7800 Pure hypercholesterolemia, unspecified: Secondary | ICD-10-CM | POA: Diagnosis not present

## 2018-02-02 DIAGNOSIS — I10 Essential (primary) hypertension: Secondary | ICD-10-CM | POA: Diagnosis not present

## 2018-02-02 DIAGNOSIS — I4819 Other persistent atrial fibrillation: Secondary | ICD-10-CM

## 2018-02-02 DIAGNOSIS — I2510 Atherosclerotic heart disease of native coronary artery without angina pectoris: Secondary | ICD-10-CM | POA: Diagnosis not present

## 2018-02-02 NOTE — Patient Instructions (Signed)
Your physician recommends that you schedule a follow-up appointment in: 3 MONTHS WITH DR CRENSHAW  

## 2018-02-07 ENCOUNTER — Ambulatory Visit (INDEPENDENT_AMBULATORY_CARE_PROVIDER_SITE_OTHER): Payer: Medicare HMO

## 2018-02-07 DIAGNOSIS — Z95810 Presence of automatic (implantable) cardiac defibrillator: Secondary | ICD-10-CM

## 2018-02-07 DIAGNOSIS — I5022 Chronic systolic (congestive) heart failure: Secondary | ICD-10-CM

## 2018-02-07 NOTE — Progress Notes (Signed)
EPIC Encounter for ICM Monitoring  Patient Name: Dave Daniels is a 77 y.o. male Date: 02/07/2018 Primary Care Physican: Loraine Leriche., MD Primary Cardiologist:Crenshaw Electrophysiologist: [name] Dry Weight:151lbs  Bi-V Pacing:>99%       Heart Failure questions reviewed, pt asymptomatic and reported he is feeling fine.  Pt had outpatient procedure on 10/24 and was given IV fluids which correlates with decreased impedance.   Thoracic impedance abnormal suggesting fluid accumulation starting 02/04/2018.   Prescribed: Furosemide 40 mg 1.5 tablets (60 mg total) daily. Potassium 20 mEq 1 tablet daily.  Labs: 01/20/2018 Creatinine 1.54. BUN 25, Potassium 3.8, Sodium 137, eGFR 42-48 07/28/2017 Creatinine 1.72, BUN 38, Potassium 4.4, Sodium 144, EGFR 38 Care Everywhere  Recommendations:  Advised to limit salt intake and encouraged to call for fluid symptoms.  Follow-up plan: ICM clinic phone appointment on 02/15/2018 to recheck fluid levels.   Office appointment scheduled 03/17/2018 with Dr. Rayann Heman.    Copy of ICM check sent to Dr. Rayann Heman and Dr Stanford Breed for review and recommendations if needed.   3 month ICM trend: 02/07/2018    AT/AF   1 Year ICM trend:       Rosalene Billings, RN 02/07/2018 2:19 PM

## 2018-02-15 ENCOUNTER — Ambulatory Visit (INDEPENDENT_AMBULATORY_CARE_PROVIDER_SITE_OTHER): Payer: Medicare HMO

## 2018-02-15 ENCOUNTER — Telehealth: Payer: Self-pay

## 2018-02-15 DIAGNOSIS — I5022 Chronic systolic (congestive) heart failure: Secondary | ICD-10-CM

## 2018-02-15 DIAGNOSIS — Z95810 Presence of automatic (implantable) cardiac defibrillator: Secondary | ICD-10-CM

## 2018-02-15 NOTE — Telephone Encounter (Signed)
Remote ICM transmission received.  Attempted call to patient regarding ICM remote transmission and left detailed message, per DPR, with next ICM remote transmission date of 04/25/2018.  Advised to return call for any fluid symptoms or questions.

## 2018-02-15 NOTE — Progress Notes (Signed)
EPIC Encounter for ICM Monitoring  Patient Name: AMARRI SATTERLY is a 77 y.o. male Date: 02/15/2018 Primary Care Physican: Loraine Leriche., MD Primary Cardiologist:Crenshaw Electrophysiologist: [name] Dry Weight:Previous weight 151lbs  Bi-V Pacing:>99%    AT/AF Burden: 6.5%     Attempted call to patient and unable to reach.  Left detailed message, per DPR, regarding transmission.  Transmission reviewed.    Thoracic impedance returned to normal since 02/07/2018 remote transmission..   Prescribed: Furosemide 40 mg 1.5 tablets (60 mg total) daily. Potassium 20 mEq 1 tablet daily.  Labs: 01/20/2018 Creatinine 1.54. BUN 25, Potassium 3.8, Sodium 137, eGFR 42-48 07/28/2017 Creatinine 1.72, BUN 38, Potassium 4.4, Sodium 144, EGFR 38 Care Everywhere  Recommendations: Left voice mail with ICM number and encouraged to call if experiencing any fluid symptoms.  Follow-up plan: ICM clinic phone appointment on 04/25/2018 due to patient has defib office appointment scheduled 03/16/2018 with Dr. Rayann Heman.    Copy of ICM check sent to Dr. Rayann Heman.   3 month ICM trend: 02/15/2018    AT/AF   1 Year ICM trend:       Rosalene Billings, RN 02/15/2018 12:25 PM

## 2018-02-22 ENCOUNTER — Other Ambulatory Visit: Payer: Self-pay | Admitting: Internal Medicine

## 2018-03-16 ENCOUNTER — Ambulatory Visit (INDEPENDENT_AMBULATORY_CARE_PROVIDER_SITE_OTHER): Payer: Medicare HMO | Admitting: Internal Medicine

## 2018-03-16 ENCOUNTER — Encounter: Payer: Self-pay | Admitting: Internal Medicine

## 2018-03-16 VITALS — BP 122/70 | HR 70 | Ht 67.0 in | Wt 154.6 lb

## 2018-03-16 DIAGNOSIS — I255 Ischemic cardiomyopathy: Secondary | ICD-10-CM | POA: Diagnosis not present

## 2018-03-16 DIAGNOSIS — Z95810 Presence of automatic (implantable) cardiac defibrillator: Secondary | ICD-10-CM

## 2018-03-16 DIAGNOSIS — I4819 Other persistent atrial fibrillation: Secondary | ICD-10-CM

## 2018-03-16 DIAGNOSIS — I5022 Chronic systolic (congestive) heart failure: Secondary | ICD-10-CM | POA: Diagnosis not present

## 2018-03-16 NOTE — Patient Instructions (Signed)
Medication Instructions:  Your physician recommends that you continue on your current medications as directed. Please refer to the Current Medication list given to you today.  Labwork: None ordered.  Testing/Procedures: Your physician has recommended that you have a Cardioversion (DCCV). Electrical Cardioversion uses a jolt of electricity to your heart either through paddles or wired patches attached to your chest. This is a controlled, usually prescheduled, procedure. Defibrillation is done under light anesthesia in the hospital, and you usually go home the day of the procedure. This is done to get your heart back into a normal rhythm. You are not awake for the procedure. Please see the instruction sheet given to you today.  Follow-Up: Your physician recommends that you schedule a follow-up appointment in:   6 weeks with Dave BalsamAmber Seiler, Dave Daniels  Remote monitoring is used to monitor your ICD from home. This monitoring reduces the number of office visits required to check your device to one time per year. It allows Dave Daniels to keep an eye on the functioning of your device to ensure it is working properly. You are scheduled for a device check from home on 1/13. You may send your transmission at any time that day. If you have a wireless device, the transmission will be sent automatically. After your physician reviews your transmission, you will receive a postcard with your next transmission date.    Any Other Special Instructions Will Be Listed Below (If Applicable).  You are scheduled for a Cardioversion on January 10 with Dr. Armanda Magicraci Daniels.  [facility]: [address] [place], [place] [address] at 7am for an 8:30am procedure. Marland Kitchen.  DIET: Nothing to eat or drink after midnight except a sip of water with medications (see medication instructions below)  Medication Instructions: Hold all your morning medications except for your Eliquis.  Do not miss  a dose of your Eliquis.  You will need to continue your anticoagulant after your procedure until you  are told by your  Provider that it is safe to stop   Labs: Your lab work will be done at the hospital prior to your procedure - you will need to arrive 1  hours ahead of your procedure (7 am)  You must have a responsible person to drive you home and stay in the waiting area during your procedure. Failure to do so could result in cancellation.  Bring your insurance cards.  *Special Note: Every effort is made to have your procedure done on time. Occasionally there are emergencies that occur at the hospital that may cause delays. Please be patient if a delay does occur.     If you need a refill on your cardiac medications before your next appointment, please call your pharmacy.

## 2018-03-16 NOTE — Progress Notes (Signed)
PCP: Cheron Schaumann., MD Primary Cardiologist: Dr Jens Som Primary EP: Dr Johney Frame  [place] Service is a 77 y.o. male who presents today for routine electrophysiology followup.  Since last being seen in our clinic, the patient reports doing very well.  He recently developed persistent afib.  He has been mostly asymptomatic with afib.  Today, he denies symptoms of palpitations, chest pain, shortness of breath,  lower extremity edema, dizziness, presyncope, syncope, or ICD shocks.  Recent laceration of his R hand is healing. The patient is otherwise without complaint today.   Past Medical History:  Diagnosis Date  . CAD (coronary artery disease)    MI in [place] with stenting in 2010, then MI with CABG in [facility] 06/2010.  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  Marland Kitchen Complete heart block (HCC)   . Gout   . Hyperlipidemia   . Hypertension   . Ischemic cardiomyopathy    EF 15% by echo 11/2010 and still 20-25% by follow-up echo 02/2011, s/p St. Jude Bi-V ICD implantation 04/01/11  . LBBB (left bundle branch block)   . Renal insufficiency    Cr 1.6 on 03/25/11   Past Surgical History:  Procedure Laterality Date  . BI-VENTRICULAR IMPLANTABLE CARDIOVERTER DEFIBRILLATOR N/A 04/01/2011   Procedure: BI-VENTRICULAR IMPLANTABLE CARDIOVERTER DEFIBRILLATOR  (CRT-D);  Surgeon: Marinus Maw, MD;  Location: [facility] CATH LAB;  Service: Cardiovascular;  Laterality: N/A;  . CARDIAC DEFIBRILLATOR PLACEMENT  12/12   BiV ICD (SJM) implanted by Dr Johney Frame  . Carpel tunnel surgery    . CORONARY ARTERY BYPASS GRAFT  3/12   in [place]  . EP IMPLANTABLE DEVICE N/A 08/19/2015   Procedure: BIV ICD Generator Changeout;  Surgeon: Hillis Range, MD;  Location[facility] INV[facility]IVE CV LAB;  Service: Cardiovascular;  Laterality: N/A;  . INGUINAL HERNIA REPAIR    . TONSILLECTOMY    . UMBILICAL HERNIA REPAIR      ROS- all systems are reviewed and negative except as per HPI above  Current Outpatient Medications  Medication Sig Dispense  Refill  . allopurinol (ZYLOPRIM) 300 MG tablet Take 300 mg by mouth daily.  11  . apixaban (ELIQUIS) 5 MG TABS tablet Take 1 tablet (5 mg total) by mouth 2 (two) times daily. 60 tablet 6  . carvedilol (COREG) 12.5 MG tablet TAKE 1 TABLET BY MOUTH TWICE DAILY WITH MEALS 180 tablet 2  . colchicine 0.6 MG tablet Take 0.6 mg by mouth daily as needed. Gout    . finasteride (PROSCAR) 5 MG tablet Take 5 mg by mouth daily.    . furosemide (LASIX) 40 MG tablet TAKE 1 & 1/2 (ONE & ONE-HALF) TABLETS BY MOUTH ONCE DAILY 135 tablet 1  . indomethacin (INDOCIN) 50 MG capsule Take 1 capsule by mouth 2 (two) times daily as needed. Gout    . losartan (COZAAR) 25 MG tablet Take 25 mg by mouth daily.   3  . potassium chloride SA (K-DUR,KLOR-CON) 20 MEQ tablet Take 1 tablet (20 mEq total) by mouth daily. 30 tablet 11   No current facility-administered medications for this visit.     Physical Exam: Vitals:   03/16/18 1227  BP: 122/70  Pulse: 70  SpO2: 99%  Weight: 154 lb 9.6 oz (70.1 kg)  Height: 5\' 7"  (1.702 m)    GEN- The patient is well appearing, alert and oriented x 3 today.   Head- normocephalic, atraumatic Eyes-  Sclera clear, conjunctiva pink Ears- hearing intact Oropharynx- clear Lungs- Clear to ausculation bilaterally, normal work of  breathing Chest- ICD pocket is well healed Heart- Regular rate and rhythm (paced) GI- soft, NT, ND, + BS Extremities- no clubbing, cyanosis, or edema Lac on R hand is healing nicely, without drainage of evidence of infection  ICD interrogation- reviewed in detail today,  See PACEART report  ekg tracing ordered today is personally reviewed and shows atypical atrial flutter, V paced  Wt Readings from Last 3 Encounters:  03/16/18 154 lb 9.6 oz (70.1 kg)  02/02/18 153 lb 12.8 oz (69.8 kg)  01/20/18 155 lb (70.3 kg)    Assessment and Plan:  1.  Chronic systolic dysfunction/ CAD/ ischemic  euvolemic today Stable on an appropriate medical regimen Normal  ICD function See Pace Art report No changes today followed in ICM device clinic  2. Persistent afib Rate controlled On eliquis Will schedule cardioversion in 3-4 weeks (he missed eliquis last week).  Risks of cardioversion discussed at length with the patient who wishes to proceed.  The importance of not missing any anticoagulation was discussed today.  Follow-up in AF clinic 2 weeks post cardioversion  Merlin Return to see EP NP in a year  Hillis RangeJames Stokely Jeancharles MD, [facility] 03/16/2018 12:52 PM

## 2018-03-23 DIAGNOSIS — I4819 Other persistent atrial fibrillation: Secondary | ICD-10-CM | POA: Insufficient documentation

## 2018-04-02 LAB — CUP PACEART REMOTE DEVICE CHECK
Brady Statistic AP VP Percent: 49 %
Brady Statistic AP VS Percent: 1 %
Brady Statistic AS VP Percent: 50 %
Brady Statistic AS VS Percent: 1 %
Date Time Interrogation Session: 20191014155126
HighPow Impedance: 66 Ohm
HighPow Impedance: 66 Ohm
Implantable Lead Implant Date: 20121219
Implantable Lead Implant Date: 20121219
Implantable Lead Location: 753858
Implantable Lead Location: 753859
Implantable Lead Location: 753860
Lead Channel Pacing Threshold Amplitude: 0.5 V
Lead Channel Pacing Threshold Pulse Width: 0.5 ms
Lead Channel Pacing Threshold Pulse Width: 0.6 ms
Lead Channel Sensing Intrinsic Amplitude: 3.8 mV
Lead Channel Setting Pacing Amplitude: 1.5 V
Lead Channel Setting Pacing Amplitude: 2 V
Lead Channel Setting Pacing Pulse Width: 0.6 ms
MDC IDC LEAD IMPLANT DT: 20121219
MDC IDC MSMT BATTERY REMAINING LONGEVITY: 47 mo
MDC IDC MSMT BATTERY REMAINING PERCENTAGE: 56 %
MDC IDC MSMT BATTERY VOLTAGE: 2.92 V
MDC IDC MSMT LEADCHNL LV IMPEDANCE VALUE: 790 Ohm
MDC IDC MSMT LEADCHNL LV PACING THRESHOLD AMPLITUDE: 2.25 V
MDC IDC MSMT LEADCHNL RA IMPEDANCE VALUE: 400 Ohm
MDC IDC MSMT LEADCHNL RA PACING THRESHOLD PULSEWIDTH: 0.5 ms
MDC IDC MSMT LEADCHNL RV IMPEDANCE VALUE: 350 Ohm
MDC IDC MSMT LEADCHNL RV PACING THRESHOLD AMPLITUDE: 0.75 V
MDC IDC MSMT LEADCHNL RV SENSING INTR AMPL: 11.6 mV
MDC IDC PG IMPLANT DT: 20170508
MDC IDC SET LEADCHNL LV PACING AMPLITUDE: 2.75 V
MDC IDC SET LEADCHNL RV PACING PULSEWIDTH: 0.5 ms
MDC IDC SET LEADCHNL RV SENSING SENSITIVITY: 2 mV
MDC IDC STAT BRADY RA PERCENT PACED: 47 %
Pulse Gen Serial Number: 7306740

## 2018-04-19 ENCOUNTER — Telehealth: Payer: Self-pay | Admitting: Cardiology

## 2018-04-19 ENCOUNTER — Telehealth: Payer: Self-pay | Admitting: Internal Medicine

## 2018-04-19 NOTE — Telephone Encounter (Signed)
Returned call to Pt.  Advised per review of Dr. Jens Som answer to this question- ok for Pt to take antibiotic. This will not interfere with DCCV.  Pt indicates understanding.

## 2018-04-19 NOTE — Telephone Encounter (Signed)
DPR on file. lmom with Dr.Crenshaw's response. Pt is to call back if any questions.

## 2018-04-19 NOTE — Telephone Encounter (Signed)
Pt can have antibiotic at dentist's discretion Olga MillersBrian Crenshaw

## 2018-04-19 NOTE — Telephone Encounter (Signed)
New message     Pt stated that he has an tooth infection and wanted to know if he can take an antibiotic. Pt was advised from dentist to call and  ask. Pt also has an cardioversion with dr turner on 1/10.

## 2018-04-19 NOTE — Telephone Encounter (Signed)
fwd to MD and Pharm-D to adv

## 2018-04-19 NOTE — Telephone Encounter (Signed)
New message   Patient is taking an antibiotic (amoxillin) for his tooth. Patient wants to know if this will interfere with the procedure that he is having done on 04/22/2018? Please advise.

## 2018-04-22 ENCOUNTER — Encounter (HOSPITAL_COMMUNITY)
Admission: RE | Disposition: A | Discharge status: Home / Self Care | Payer: Self-pay | Source: Home / Self Care | Attending: Cardiology

## 2018-04-22 ENCOUNTER — Ambulatory Visit (HOSPITAL_COMMUNITY)
Admission: RE | Admit: 2018-04-22 | Discharge: 2018-04-22 | Disposition: A | Discharge status: Home / Self Care | Payer: Medicare HMO | Attending: Cardiology | Admitting: Cardiology

## 2018-04-22 ENCOUNTER — Encounter (HOSPITAL_COMMUNITY): Payer: Self-pay | Admitting: *Deleted

## 2018-04-22 ENCOUNTER — Ambulatory Visit (HOSPITAL_COMMUNITY): Payer: Medicare HMO | Admitting: Certified Registered"

## 2018-04-22 DIAGNOSIS — Z95810 Presence of automatic (implantable) cardiac defibrillator: Secondary | ICD-10-CM | POA: Insufficient documentation

## 2018-04-22 DIAGNOSIS — I442 Atrioventricular block, complete: Secondary | ICD-10-CM | POA: Insufficient documentation

## 2018-04-22 DIAGNOSIS — I252 Old myocardial infarction: Secondary | ICD-10-CM | POA: Diagnosis not present

## 2018-04-22 DIAGNOSIS — E785 Hyperlipidemia, unspecified: Secondary | ICD-10-CM | POA: Diagnosis not present

## 2018-04-22 DIAGNOSIS — Z888 Allergy status to other drugs, medicaments and biological substances status: Secondary | ICD-10-CM | POA: Insufficient documentation

## 2018-04-22 DIAGNOSIS — Z7901 Long term (current) use of anticoagulants: Secondary | ICD-10-CM | POA: Diagnosis not present

## 2018-04-22 DIAGNOSIS — Z951 Presence of aortocoronary bypass graft: Secondary | ICD-10-CM | POA: Diagnosis not present

## 2018-04-22 DIAGNOSIS — Z79899 Other long term (current) drug therapy: Secondary | ICD-10-CM | POA: Insufficient documentation

## 2018-04-22 DIAGNOSIS — I10 Essential (primary) hypertension: Secondary | ICD-10-CM | POA: Insufficient documentation

## 2018-04-22 DIAGNOSIS — I255 Ischemic cardiomyopathy: Secondary | ICD-10-CM | POA: Insufficient documentation

## 2018-04-22 DIAGNOSIS — I2510 Atherosclerotic heart disease of native coronary artery without angina pectoris: Secondary | ICD-10-CM | POA: Insufficient documentation

## 2018-04-22 DIAGNOSIS — M109 Gout, unspecified: Secondary | ICD-10-CM | POA: Diagnosis not present

## 2018-04-22 DIAGNOSIS — I4892 Unspecified atrial flutter: Secondary | ICD-10-CM

## 2018-04-22 DIAGNOSIS — I447 Left bundle-branch block, unspecified: Secondary | ICD-10-CM | POA: Insufficient documentation

## 2018-04-22 DIAGNOSIS — I4819 Other persistent atrial fibrillation: Secondary | ICD-10-CM | POA: Diagnosis not present

## 2018-04-22 HISTORY — PX: CARDIOVERSION: SHX1299

## 2018-04-22 LAB — POCT I-STAT 4, (NA,K, GLUC, HGB,HCT)
Glucose, Bld: 92 mg/dL (ref 70–99)
HCT: 40 % (ref 39.0–52.0)
HEMOGLOBIN: 13.6 g/dL (ref 13.0–17.0)
Potassium: 3.7 mmol/L (ref 3.5–5.1)
SODIUM: 141 mmol/L (ref 135–145)

## 2018-04-22 SURGERY — CARDIOVERSION
Anesthesia: General

## 2018-04-22 MED ORDER — ETOMIDATE 2 MG/ML IV SOLN
INTRAVENOUS | Status: DC | PRN
  Administered 2018-04-22: 14 mg via INTRAVENOUS

## 2018-04-22 MED ORDER — SODIUM CHLORIDE 0.9 % IV SOLN
INTRAVENOUS | Status: DC | PRN
  Administered 2018-04-22: 10:00:00 via INTRAVENOUS

## 2018-04-22 NOTE — Anesthesia Postprocedure Evaluation (Signed)
Anesthesia Post Note  Patient: Dave Daniels  Procedure(s) Performed: CARDIOVERSION (N/A )     Patient location during evaluation: Endoscopy Anesthesia Type: General Level of consciousness: awake and alert, patient cooperative and oriented Pain management: pain level controlled Vital Signs Assessment: post-procedure vital signs reviewed and stable Respiratory status: spontaneous breathing, nonlabored ventilation and respiratory function stable Cardiovascular status: blood pressure returned to baseline and stable Postop Assessment: no apparent nausea or vomiting Anesthetic complications: no    Last Vitals:  Vitals:   04/22/18 0652 04/22/18 0946  BP: 139/70 137/65  Pulse: 70 68  Resp: 11 (!) 22  Temp: 36.5 C 36.7 C  SpO2: 96% 99%    Last Pain:  Vitals:   04/22/18 0652  TempSrc: Oral  PainSc: 0-No pain                 Shauntay Brunelli,E. Jazman Reuter

## 2018-04-22 NOTE — H&P (Signed)
Cardiology Admission History and Physical:   Patient ID: Dave Daniels MRN: [id number]; DOB: June 18, 1940   Admission date: 04/22/2018  Primary Care Provider: Cheron SchaumannVelazquez, Gretchen Y., MD Primary Cardiologist: Dr. Jens Somrenshaw Primary Electrophysiologist:  Dr. Johney FrameAllred  Chief Complaint:  AFib, here for DCCV  Patient Profile:   Dave Daniels is a 78 y.o. male with PMHx of CAD, ICM, CHB w/CRT-D, HTN, HLD, and AFib  History of Present Illness:   Dave Daniels saw Dr. Johney FrameAllred 03/16/18, his ICD was functioning normally , no programming changes were made.  He was planned for DCCV 2/2 persistent AFib,  (atypical AFlutter) though needed to wait given he had missed a dose of his Eliquis.  The patient confirms today no missed doses of his Eliquis for greater then 4 weeks, he feels well, no CP, SOB, no recent illnesses.  We discussed the DCCV procedure, potential risks and benefits, and he would like to proceed.   Past Medical History:  Diagnosis Date  . CAD (coronary artery disease)    [place] in 2010, [place] [facility].  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  Marland Kitchen. Complete heart block (HCC)   . Gout   . Hyperlipidemia   . Hypertension   . Ischemic cardiomyopathy    EF 15% by echo 11/2010 and still 20-25% by follow-up echo 02/2011, s/p St. Jude Bi-V ICD implantation 04/01/11  . LBBB (left bundle branch block)   . Renal insufficiency    Cr 1.6 on 03/25/11    Past Surgical History:  Procedure Laterality Date  . BI-VENTRICULAR IMPLANTABLE CARDIOVERTER DEFIBRILLATOR N/A 04/01/2011   Procedure: BI-VENTRICULAR IMPLANTABLE CARDIOVERTER DEFIBRILLATOR  (CRT-D);  Surgeon: Marinus MawGregg W Taylor, MD;  [facility];  Service: Cardiovascular;  Laterality: N/A;  . CARDIAC DEFIBRILLATOR PLACEMENT  12/12   BiV ICD (SJM) implanted by Dr Johney FrameAllred  . Carpel tunnel surgery    . CORONARY ARTERY BYPASS GRAFT  3/12   [place]  . EP IMPLANTABLE DEVICE N/A 08/19/2015   Procedure: BIV ICD  Generator Changeout;  Surgeon: Hillis RangeJames [name], MD;  [facility];  Service: Cardiovascular;  Laterality: N/A;  . INGUINAL HERNIA REPAIR    . TONSILLECTOMY    . UMBILICAL HERNIA REPAIR       Medications Prior to Admission: Prior to Admission medications   Medication Sig Start Date End Date Taking? Authorizing Provider  acetaminophen (TYLENOL) 500 MG tablet Take 1,000 mg by mouth daily as needed for moderate pain or headache.   Yes [provider]  allopurinol (ZYLOPRIM) 300 MG tablet Take 300 mg by mouth daily at 2 PM.  12/31/17  Yes [provider]  apixaban (ELIQUIS) 5 MG TABS tablet Take 1 tablet (5 mg total) by mouth 2 (two) times daily. 01/20/18  Yes Newman Niparroll, Donna C, NP  carvedilol (COREG) 12.5 MG tablet TAKE 1 TABLET BY MOUTH TWICE DAILY WITH MEALS 11/12/17  Yes Lewayne Buntingrenshaw, Brian S, MD  colchicine 0.6 MG tablet Take 0.6 mg by mouth daily as needed (gout flare).    Yes [provider]  finasteride (PROSCAR) 5 MG tablet Take 5 mg by mouth daily.   Yes [provider]  furosemide (LASIX) 40 MG tablet TAKE 1 & 1/2 (ONE & ONE-HALF) TABLETS BY MOUTH ONCE DAILY Patient taking differently: Take 60 mg by mouth daily.  02/23/18  Yes [name], Fayrene FearingJames, MD  indomethacin (INDOCIN) 50 MG capsule Take 50 mg by mouth daily as needed (gout flare).  11/12/15  Yes [provider]  losartan (COZAAR) 25 MG tablet Take 25 mg by mouth daily.  06/06/14  Yes [provider]  potassium chloride SA (K-DUR,KLOR-CON) 20 MEQ tablet Take 1 tablet (20 mEq total) by mouth daily. 10/24/14 01/05/22 Yes Lewayne Bunting, MD     Allergies:    Allergies  Allergen Reactions  . Statins Hives    Muscle pain & severe hives  . Nsaids Other (See Comments)    Renal insufficiency   . Other     Statins-reductase inhibitor  . Tolmetin Other (See Comments)    Renal insufficiency    Social History:   Social History   Socioeconomic History  . Marital status: Married      Spouse name: Not on file  . Number of children: 4  . Years of education: Not on file  . 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 Financial resource strain: Not on file  . Food insecurity:    Worry: Not on file    Inability: Not on file  . Transportation needs:    Medical: Not on file    Non-medical: Not on file  Tobacco Use  . Smoking status: Never Smoker  . Smokeless tobacco: Never Used  Substance and Sexual Activity  . Alcohol use: Yes    Comment: occasional  . Drug use: No  . Sexual activity: Yes  Lifestyle  . Physical activity:    Days per week: Not on file    Minutes per session: Not on file  . Stress: Not on file  Relationships  . Social connections:    Talks on phone: Not on file    Gets together: Not on file    Attends religious service: Not on file    Active member of club or organization: Not on file    Attends meetings of clubs or organizations: Not on file    Relationship status: Not on file  . Intimate partner violence:    Fear of current or ex partner: Not on file    Emotionally abused: Not on file    Physically abused: Not on file    Forced sexual activity: Not on file  Other Topics Concern  . Not on file  Social History Narrative   Lives in [place], recently moved from [place].  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    Family History:   The patient's family history includes Pancreatic cancer in his mother.    ROS:  Please see the history of present illness.  All other ROS reviewed and negative.     Physical Exam/Data:   Vitals:   04/22/18 0652  BP: 139/70  Pulse: 70  Resp: 11  Temp: 97.7 F (36.5 C)  TempSrc: Oral  SpO2: 96%  Weight: 68 kg  Height: 5\' 7"  (1.702 m)   No intake or output data in the 24 hours ending 04/22/18 0804 Last 3 Weights 04/22/2018 03/16/2018 02/02/2018  Weight (lbs) 150 lb 154 lb 9.6 oz 153 lb 12.8 oz  Weight (kg) 68.04 kg 70.126 kg 69.763 kg     Body mass index is  23.49 kg/m.  General:  Well nourished, well developed, in no acute distress HEENT: normal Lymph: no adenopathy Neck: no JVD Endocrine:  No thryomegaly Vascular: No carotid bruits Cardiac:  RRR (paced); no murmurs, gallops or rubs Lungs:  CTA b/l, no wheezing, rhonchi or rales  Abd: soft, nontender  Ext: no edema Musculoskeletal:  No  deformities Skin: warm and dry  Neuro: no gross focal abnormalities noted Psych:  Normal affect    EKG:  No ekg, telemetry looks AF/V paced  Relevant CV Studies:  03/16/16: TTE Study Conclusions - Left ventricle: The cavity size was normal. Wall thickness was   increased in a pattern of moderate LVH. Systolic function was   severely reduced. The estimated ejection fraction was in the   range of 25% to 30%. There is akinesis of the anteroseptal and   anterior myocardium. Doppler parameters are consistent with   abnormal left ventricular relaxation (grade 1 diastolic   dysfunction). Doppler parameters are consistent with high   ventricular filling pressure. - Aortic valve: There was mild regurgitation directed eccentrically   in the LVOT and towards the mitral anterior leaflet. - Left atrium: The atrium was mildly dilated. Impressions: - Compared to the prior study, there has been no significant   interval change.  Laboratory Data:  ChemistryNo results for input(s): NA, K, CL, CO2, GLUCOSE, BUN, CREATININE, CALCIUM, GFRNONAA, GFRAA, ANIONGAP in the last 168 hours.  No results for input(s): PROT, ALBUMIN, AST, ALT, ALKPHOS, BILITOT in the last 168 hours. HematologyNo results for input(s): WBC, RBC, HGB, HCT, MCV, MCH, MCHC, RDW, PLT in the last 168 hours. Cardiac EnzymesNo results for input(s): TROPONINI in the last 168 hours. No results for input(s): TROPIPOC in the last 168 hours.  BNPNo results for input(s): BNP, PROBNP in the last 168 hours.  DDimer No results for input(s): DDIMER in the last 168 hours.  Radiology/Studies:  No results  found.  Assessment and Plan:   1. Persistent AFib, atypical AFlutter     CHA2DS2Vasc is 4, on Eliquis, appropriately dosed for age/weight     appeasr to be in AF on tele, V paced     SJM rep will be at bedside for dccv      Plan DCCV today Has AFib clinic f/u in place  2. ICM     Appears compensated current;y  3. CAD     No anginal complaints     For questions or updates, please contact [facility] Please consult [email or web address] for contact info under        Signed, Sheilah Pigeon, PA-C  04/22/2018 8:04 AM

## 2018-04-22 NOTE — Progress Notes (Signed)
ISTAT obtained, results did not move over to chart. Results listed below.   ISTAT @ 0700  Na 141 K 3.7 Glu 92 Hct 40 Hb 13.6  MD and MDA made aware of results.

## 2018-04-22 NOTE — Discharge Instructions (Signed)
Chemical Cardioversion, Care After  This sheet gives you information about how to care for yourself after your procedure. Your health care provider may also give you more specific instructions. If you have problems or questions, contact your health care provider.  What can I expect after the procedure?  After the procedure, it is common to have:  · Fatigue or tiredness.  · Mild throat discomfort, if you had a test to look for blood clots in your heart (transesophageal echocardiogram, TEE).  Follow these instructions at home:  General instructions    · Take over-the-counter and prescription medicines only as told by your health care provider. You may need to take blood thinners (anticoagulants) or medicines to control your heart rhythm.  · If you are taking blood thinners:  ? Talk with your health care provider before you take any medicines that contain aspirin or NSAIDs. These medicines increase your risk for dangerous bleeding.  ? Take your medicine exactly as told, at the same time every day.  ? Avoid activities that could cause injury or bruising, and follow instructions about how to prevent falls.  ? Wear a medical alert bracelet or carry a card that lists what medicines you take.  · Keep all follow-up visits as told by your health care provider. This is important.  Eating and drinking    · Follow instructions from your health care provider about eating and drinking restrictions. You may have to follow a low-salt (low-sodium), low-fat, and low-cholesterol diet.  · Drink enough fluid to keep your urine pale yellow.  Activity  · Ask your health care provider what activities are safe for you.  · Do not drive for 24 hours if you were given a medicine to help you relax (sedative) during your procedure.  Lifestyle  · Limit alcohol intake to no more than 1 drink a day for nonpregnant women and 2 drinks a day for men. One drink equals 12 oz of beer, 5 oz of wine, or 1½ oz of hard liquor.  · Do not use any products that  contain nicotine or tobacco, such as cigarettes and e-cigarettes. If you need help quitting, ask your health care provider.  Contact a health care provider if:  · You have a fever.  · You have severe pain, and medicines do not help.  · You have problems taking your medicines.  · You have irregular heartbeats.  Get help right away if:    · Your heart rhythm changes.  · You have chest pain or shortness of breath.  · You feel dizzy.  · You faint.  · You have any symptoms of a stroke. "BE FAST" is an easy way to remember the main warning signs of a stroke:  ? B - Balance. Signs are dizziness, sudden trouble walking, or loss of balance.  ? E - Eyes. Signs are trouble seeing or a sudden change in vision.  ? F - Face. Signs are sudden weakness or numbness of the face, or the face or eyelid drooping on one side.  ? A - Arms. Signs are weakness or numbness in an arm. This happens suddenly and usually on one side of the body.  ? S - Speech. Signs are sudden trouble speaking, slurred speech, or trouble understanding what people say.  ? T - Time. Time to call emergency services. Write down what time symptoms started.  · You have other signs of a stroke, such as:  ? A sudden, severe headache with no known   cause.  ? Nausea or vomiting.  ? Seizure.  These symptoms may represent a serious problem that is an emergency. Do not wait to see if the symptoms will go away. Get medical help right away. Call your local emergency services (911 in the [place] Do not drive yourself to the hospital.  Summary  · Some fatigue is common after this procedure.  · You may have to take blood thinners (anticoagulants) or medicines to control your heart rhythm.  · If you have symptoms of a stroke, get help right away. "BE FAST" is an easy way to remember the main warning signs of a stroke.  This information is not intended to replace advice given to you by your health care provider. Make sure you discuss any questions you have with your health care  provider.  Document Released: 09/18/2016 Document Revised: 12/17/2017 Document Reviewed: 09/18/2016  Elsevier Interactive Patient Education © 2019 [employer] Inc[employer]

## 2018-04-22 NOTE — Anesthesia Preprocedure Evaluation (Addendum)
Anesthesia Evaluation  Patient identified by MRN, date of birth, ID band Patient awake    Reviewed: Allergy & Precautions, NPO status , Patient's Chart, lab work & pertinent test results, reviewed documented beta blocker date and time   History of Anesthesia Complications Negative for: history of anesthetic complications  Airway Mallampati: I  TM Distance: >3 FB Neck ROM: Full    Dental  (+) Dental Advisory Given   Pulmonary neg pulmonary ROS,    breath sounds clear to auscultation       Cardiovascular hypertension, Pt. on medications and Pt. on home beta blockers (-) angina+ CAD, + Past MI, + Cardiac Stents and + CABG  + dysrhythmias Atrial Fibrillation + Cardiac Defibrillator (St Jude BiV)  Rhythm:Irregular Rate:Normal  '17 ECHO: EF 25-30%, mild AI   Neuro/Psych negative neurological ROS     GI/Hepatic negative GI ROS, Neg liver ROS,   Endo/Other  negative endocrine ROS  Renal/GU Renal InsufficiencyRenal disease     Musculoskeletal   Abdominal   Peds  Hematology eliquis   Anesthesia Other Findings   Reproductive/Obstetrics                            Anesthesia Physical Anesthesia Plan  ASA: IV  Anesthesia Plan: General   Post-op Pain Management:    Induction: Intravenous  PONV Risk Score and Plan: 2 and Treatment may vary due to age or medical condition  Airway Management Planned: Natural Airway and Mask  Additional Equipment:   Intra-op Plan:   Post-operative Plan:   Informed Consent: I have reviewed the patients History and Physical, chart, labs and discussed the procedure including the risks, benefits and alternatives for the proposed anesthesia with the patient or authorized representative who has indicated his/her understanding and acceptance.   Dental advisory given  Plan Discussed with: CRNA and Surgeon  Anesthesia Plan Comments: (Plan routine monitors, GA for  cardioversion)       Anesthesia Quick Evaluation

## 2018-04-22 NOTE — CV Procedure (Addendum)
   Electrical Cardioversion Procedure Note SHAYQUAN NEWSTROM [id number] [birth year]  Procedure: Electrical Cardioversion Indications:  Atrial Flutter  Time Out: Verified patient identification, verified procedure,medications/allergies/relevent history reviewed, required imaging and test results available.  Performed  Procedure Details  The patient was NPO after midnight. Anesthesia was administered at the beside  by Dr.Jackson with 12mg  of Etomodate.  Cardioversion was done with synchronized biphasic defibrillation with AP pads with 150watts.  The patient converted to normal sinus rhythm. The patient tolerated the procedure well   IMPRESSION:  Successful cardioversion of atrial flutter    Traci Turner 04/22/2018, 8:05 AM

## 2018-04-22 NOTE — Transfer of Care (Signed)
Immediate Anesthesia Transfer of Care Note  Patient: Dave Daniels  Procedure(s) Performed: CARDIOVERSION (N/A )  Patient Location: Endoscopy Unit  Anesthesia Type:General  Level of Consciousness: awake and drowsy  Airway & Oxygen Therapy: Patient Spontanous Breathing  Post-op Assessment: Report given to RN, Post -op Vital signs reviewed and stable and Patient moving all extremities X 4  Post vital signs: Reviewed and stable  Last Vitals:  Vitals Value Taken Time  BP    Temp    Pulse    Resp    SpO2      Last Pain:  Vitals:   04/22/18 0652  TempSrc: Oral  PainSc: 0-No pain         Complications: No apparent anesthesia complications

## 2018-04-24 ENCOUNTER — Encounter (HOSPITAL_COMMUNITY): Payer: Self-pay | Admitting: Cardiology

## 2018-04-25 ENCOUNTER — Ambulatory Visit (INDEPENDENT_AMBULATORY_CARE_PROVIDER_SITE_OTHER): Payer: Medicare HMO

## 2018-04-25 DIAGNOSIS — I255 Ischemic cardiomyopathy: Secondary | ICD-10-CM | POA: Diagnosis not present

## 2018-04-25 DIAGNOSIS — I5022 Chronic systolic (congestive) heart failure: Secondary | ICD-10-CM

## 2018-04-25 NOTE — Progress Notes (Signed)
HPI: FU coronary artery disease status post coronary artery bypass and graft as well as ischemic cardiomyopathy. Patient's cardiac history dates back to 2010 when he had his first myocardial infarction. [place] st[place] in [place]. He had a second myocardial infarction in March of 2012 and then had coronary artery bypass and graft.Cardiac catheterization was performed in August of 2012. Ejection fraction was 20%. The right coronary and LAD were occluded and there was critical circumflex disease. There was a patent saphenous vein graft to the right coronary artery. The LIMA to the LAD was patent. The saphenous vein graft to the obtuse marginal was also patent.Patient had biventricular ICD placed in December of 2012.Last echocardiogram December 2017 showed ejection fraction 25-30%, grade 1 diastolic dysfunction, mild aortic insufficiency and mild left atrial enlargement. Recently noted to have atrial fibrillation on his device; seen in atrial fibrillation and rate control recommended. However found to have fluid accumulation on FU device check.  Underwent successful cardioversion on 04/22/18.  Since last seen,  patient denies dyspnea, chest pain, palpitations, syncope.  Energy is unchanged since cardioversion.  Current Outpatient Medications  Medication Sig Dispense Refill  . acetaminophen (TYLENOL) 500 MG tablet Take 1,000 mg by mouth daily as needed for moderate pain or headache.    . allopurinol (ZYLOPRIM) 300 MG tablet Take 300 mg by mouth daily at 2 PM.   11  . apixaban (ELIQUIS) 5 MG TABS tablet Take 1 tablet (5 mg total) by mouth 2 (two) times daily. 60 tablet 6  . carvedilol (COREG) 12.5 MG tablet TAKE 1 TABLET BY MOUTH TWICE DAILY WITH MEALS 180 tablet 2  . colchicine 0.6 MG tablet Take 0.6 mg by mouth daily as needed (gout flare).     . finasteride (PROSCAR) 5 MG tablet Take 5 mg by mouth daily.    . furosemide (LASIX) 40 MG tablet TAKE 1 & 1/2 (ONE & ONE-HALF) TABLETS BY MOUTH ONCE  DAILY (Patient taking differently: Take 60 mg by mouth daily. ) 135 tablet 1  . indomethacin (INDOCIN) 50 MG capsule Take 50 mg by mouth daily as needed (gout flare).     Marland Kitchen. losartan (COZAAR) 25 MG tablet Take 25 mg by mouth daily.   3  . potassium chloride SA (K-DUR,KLOR-CON) 20 MEQ tablet Take 1 tablet (20 mEq total) by mouth daily. 30 tablet 11   No current facility-administered medications for this visit.      Past Medical History:  Diagnosis Date  . CAD (coronary artery disease)    [place] with [place]ing in 2010, then [place] [facility] [place] [place].  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  Marland Kitchen. Complete heart block (HCC)   . Gout   . Hyperlipidemia   . Hypertension   . Ischemic cardiomyopathy    EF 15% by echo 11/2010 and still 20-25% by follow-up echo 02/2011, s/p St. Jude Bi-V ICD implantation 04/01/11  . LBBB (left bundle branch block)   . Renal insufficiency    Cr 1.6 on 03/25/11    Past Surgical History:  Procedure Laterality Date  . BI-VENTRICULAR IMPLANTABLE CARDIOVERTER DEFIBRILLATOR N/A 04/01/2011   Procedure: BI-VENTRICULAR IMPLANTABLE CARDIOVERTER DEFIBRILLATOR  (CRT-D);  Surgeon: Marinus MawGregg W Taylor, MD;  [facility];  Service: Cardiovascular;  Laterality: N/A;  . CARDIAC DEFIBRILLATOR PLACEMENT  12/12   BiV ICD (SJM) implanted by Dr Johney FrameAllred  . CARDIOVERSION N/A 04/22/2018   Procedure: CARDIOVERSION;  Surgeon: Quintella Reicherturner, Traci R, MD;  [facility];  Service:  Cardiovascular;  Laterality: N/A;  . Carpel tunnel surgery    . CORONARY ARTERY BYPASS GRAFT  3/12   in [place]  . EP IMPLANTABLE DEVICE N/A 08/19/2015   Procedure: BIV ICD Generator Changeout;  Surgeon: Hillis Range, MD;  Location: [facility] INVASIVE CV LAB;  Service: Cardiovascular;  Laterality: N/A;  . INGUINAL HERNIA REPAIR    . TONSILLECTOMY    . UMBILICAL HERNIA REPAIR      Social History   Socioeconomic History  . Marital status: Married    Spouse name: Not on file  . Number of children: 4  . Years of  education: Not on file  . 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 Financial resource strain: Not on file  . Food insecurity:    Worry: Not on file    Inability: Not on file  . Transportation needs:    Medical: Not on file    Non-medical: Not on file  Tobacco Use  . Smoking status: Never Smoker  . Smokeless tobacco: Never Used  Substance and Sexual Activity  . Alcohol use: Yes    Comment: occasional  . Drug use: No  . Sexual activity: Yes  Lifestyle  . Physical activity:    Days per week: Not on file    Minutes per session: Not on file  . Stress: Not on file  Relationships  . Social connections:    Talks on phone: Not on file    Gets together: Not on file    Attends religious service: Not on file    Active member of club or organization: Not on file    Attends meetings of clubs or organizations: Not on file    Relationship status: Not on file  . Intimate partner violence:    Fear of current or ex partner: Not on file    Emotionally abused: Not on file    Physically abused: Not on file    Forced sexual activity: Not on file  Other Topics Concern  . Not on file  Social History Narrative   Lives in [place], recently moved from [place].  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    Family History  Problem Relation Age of Onset  . Pancreatic cancer Mother     ROS: no fevers or chills, productive cough, hemoptysis, dysphasia, odynophagia, melena, hematochezia, dysuria, hematuria, rash, seizure activity, orthopnea, PND, pedal edema, claudication. Remaining systems are negative.  Physical Exam: Well-developed well-nourished in no acute distress.  Skin is warm and dry.  HEENT is normal.  Neck is supple.  Chest is clear to auscultation with normal expansion.  Cardiovascular exam is regular rate and rhythm.  Abdominal exam nontender or distended. No masses palpated. Extremities show no edema. neuro grossly intact  ECG-sinus  rhythm with occasional PVC and ventricular pacing.  Personally reviewed  A/P  1 paroxysmal atrial fibrillation-patient is now status post cardioversion and remains in sinus rhythm.  We will continue with anticoagulation and carvedilol.  If he has atrial fibrillation in the future then rate control and anticoagulation would likely be best option.  2 coronary artery disease-plan to continue medical therapy as he is not having chest pain.  He is intolerant to statins.  He is not on aspirin given need for anticoagulation.  3 hypertension-patient's blood pressure is controlled.  Continue present medications and follow.  4 hyperlipidemia-patient is intolerant to statins.  Check lipids.  If LDL elevated will consider Zetia or Repatha.  5 chronic systolic congestive heart failure-he is not volume overloaded on examination.  Continue present dose of Lasix.  Continue fluid restriction and low-sodium diet.  6 ischemic cardiomyopathy-continue ARB and beta-blocker.  He declined Entresto previously.  Olga Millers, MD

## 2018-04-26 NOTE — Progress Notes (Signed)
Remote ICD transmission.   

## 2018-04-27 ENCOUNTER — Ambulatory Visit (INDEPENDENT_AMBULATORY_CARE_PROVIDER_SITE_OTHER): Payer: Medicare HMO | Admitting: Cardiology

## 2018-04-27 ENCOUNTER — Encounter: Payer: Self-pay | Admitting: Cardiology

## 2018-04-27 VITALS — BP 110/60 | HR 61 | Ht 67.0 in | Wt 153.8 lb

## 2018-04-27 DIAGNOSIS — I480 Paroxysmal atrial fibrillation: Secondary | ICD-10-CM

## 2018-04-27 DIAGNOSIS — I2510 Atherosclerotic heart disease of native coronary artery without angina pectoris: Secondary | ICD-10-CM

## 2018-04-27 DIAGNOSIS — I10 Essential (primary) hypertension: Secondary | ICD-10-CM

## 2018-04-27 DIAGNOSIS — E7800 Pure hypercholesterolemia, unspecified: Secondary | ICD-10-CM

## 2018-04-27 NOTE — Patient Instructions (Signed)
Medication Instructions:   NO CHANGE  Follow-Up:  Your physician recommends that you schedule a follow-up appointment in: 6 MONTHS WITH DR CRENSHAW PLEASE GIVE OUR OFFICE A CALL 2 MONTHS PRIOR TO THAT APPOINTMENT TIME TO SCHEDULE   CALL IN MAY FOR AN APPOINTMENT IN JULY      

## 2018-04-29 LAB — CUP PACEART REMOTE DEVICE CHECK
Battery Remaining Longevity: 44 mo
Battery Remaining Percentage: 52 %
Battery Voltage: 2.92 V
Brady Statistic AP VP Percent: 43 %
Brady Statistic AP VS Percent: 1 %
Brady Statistic AS VP Percent: 57 %
Brady Statistic AS VS Percent: 1 %
Brady Statistic RA Percent Paced: 3.2 %
HighPow Impedance: 66 Ohm
HighPow Impedance: 66 Ohm
Implantable Lead Implant Date: 20121219
Implantable Lead Implant Date: 20121219
Implantable Lead Implant Date: 20121219
Implantable Lead Location: 753858
Implantable Lead Location: 753859
Implantable Lead Location: 753860
Implantable Pulse Generator Implant Date: 20170508
Lead Channel Impedance Value: 360 Ohm
Lead Channel Impedance Value: 390 Ohm
Lead Channel Impedance Value: 800 Ohm
Lead Channel Pacing Threshold Amplitude: 0.75 V
Lead Channel Pacing Threshold Amplitude: 2.375 V
Lead Channel Pacing Threshold Pulse Width: 0.5 ms
Lead Channel Pacing Threshold Pulse Width: 0.5 ms
Lead Channel Pacing Threshold Pulse Width: 0.6 ms
Lead Channel Sensing Intrinsic Amplitude: 12 mV
Lead Channel Sensing Intrinsic Amplitude: 3.7 mV
Lead Channel Setting Pacing Amplitude: 2 V
Lead Channel Setting Pacing Amplitude: 2 V
Lead Channel Setting Pacing Amplitude: 2.875
Lead Channel Setting Pacing Pulse Width: 0.5 ms
Lead Channel Setting Sensing Sensitivity: 2 mV
MDC IDC MSMT LEADCHNL RA PACING THRESHOLD AMPLITUDE: 0.625 V
MDC IDC PG SERIAL: 7306740
MDC IDC SESS DTM: 20200113150542
MDC IDC SET LEADCHNL LV PACING PULSEWIDTH: 0.6 ms

## 2018-05-04 LAB — CUP PACEART INCLINIC DEVICE CHECK
Battery Remaining Longevity: 40 mo
Brady Statistic RA Percent Paced: 43 %
Brady Statistic RV Percent Paced: 99.69 %
Date Time Interrogation Session: 20191204183811
HighPow Impedance: 69 Ohm
Implantable Lead Implant Date: 20121219
Implantable Lead Implant Date: 20121219
Implantable Lead Location: 753859
Implantable Lead Location: 753860
Implantable Pulse Generator Implant Date: 20170508
Lead Channel Impedance Value: 375 Ohm
Lead Channel Impedance Value: 425 Ohm
Lead Channel Pacing Threshold Amplitude: 0.75 V
Lead Channel Pacing Threshold Amplitude: 1.875 V
Lead Channel Pacing Threshold Pulse Width: 0.5 ms
Lead Channel Pacing Threshold Pulse Width: 0.6 ms
Lead Channel Sensing Intrinsic Amplitude: 5 mV
Lead Channel Setting Pacing Amplitude: 1.5 V
Lead Channel Setting Pacing Amplitude: 2.375
Lead Channel Setting Pacing Pulse Width: 0.5 ms
Lead Channel Setting Pacing Pulse Width: 0.6 ms
Lead Channel Setting Sensing Sensitivity: 2 mV
MDC IDC LEAD IMPLANT DT: 20121219
MDC IDC LEAD LOCATION: 753858
MDC IDC MSMT LEADCHNL LV IMPEDANCE VALUE: 825 Ohm
MDC IDC SET LEADCHNL RV PACING AMPLITUDE: 2 V
Pulse Gen Serial Number: 7306740

## 2018-05-05 ENCOUNTER — Ambulatory Visit (INDEPENDENT_AMBULATORY_CARE_PROVIDER_SITE_OTHER): Payer: Medicare HMO

## 2018-05-05 DIAGNOSIS — I5022 Chronic systolic (congestive) heart failure: Secondary | ICD-10-CM

## 2018-05-05 DIAGNOSIS — Z95810 Presence of automatic (implantable) cardiac defibrillator: Secondary | ICD-10-CM | POA: Diagnosis not present

## 2018-05-06 NOTE — Progress Notes (Signed)
EPIC Encounter for ICM Monitoring  Patient Name: Dave Daniels is a 78 y.o. male Date: 05/06/2018 Primary Care Physican: Loraine Leriche., MD Primary Cardiologist:Crenshaw Electrophysiologist: [name] Dry Weight:Previous weight 151lbs  Bi-V Pacing:>99%       AT/AF Burden: 75%                                 Spoke with patient. He said he is feeling well.  Cardioversion 04/22/2018.  Report appears to show no AT/AF after 04/27/2018   Thoracic impedance normal.  Prescribed: Furosemide 40 mg 1.5 tablets (60 mg total) daily. Potassium 20 mEq 1 tablet daily.  Labs: 01/20/2018 Creatinine 1.54. BUN 25, Potassium 3.8, Sodium 137, eGFR 42-48 07/28/2017 Creatinine 1.72, BUN 38, Potassium 4.4, Sodium 144, EGFR 38 Care Everywhere  Recommendations: No changes.   Follow-up plan: ICM clinic phone appointment on 06/14/2018 due to patient has defib office appointment scheduled 05/12/2018 with Chanetta Marshall, NP.    Copy of ICM check sent to Dr. Rayann Heman.   AT/AF    3 month ICM trend: 05/05/2018    1 Year ICM trend:       Rosalene Billings, RN 05/06/2018 1:57 PM

## 2018-05-11 NOTE — Progress Notes (Signed)
Electrophysiology Office Note Date: 05/12/2018  ID:  [place] ServiceFabio A Daniels, DOB Jan 03, 1941, MRN [id number]  PCP: Cheron SchaumannVelazquez, Dave Y., MD Primary Cardiologist: Dave Daniels Electrophysiologist: [name]  CC: Routine ICD follow-up  Boston ServiceFabio A Kannan is a 78 y.o. male seen today for Dr Dave Daniels.  He presents today for routine electrophysiology followup.  Since last being seen in our clinic, the patient reports doing very well. He denies chest pain, palpitations, dyspnea, PND, orthopnea, nausea, vomiting, dizziness, syncope, edema, weight gain, or early satiety.  He has not had ICD shocks.   Device History: STJ CRTD implanted 2012 for ICM, CHF; gen change 2017 History of appropriate therapy: No History of AAD therapy: No   Past Medical History:  Diagnosis Date  . CAD (coronary artery disease)    [place]ing in 2010, then [place] [facility].  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  Dave Daniels. Complete heart block (HCC)   . Gout   . Hyperlipidemia   . Hypertension   . Ischemic cardiomyopathy    EF 15% by echo 11/2010 and still 20-25% by follow-up echo 02/2011, s/p St. Jude Bi-V ICD implantation 04/01/11  . LBBB (left bundle branch block)   . Renal insufficiency    Cr 1.6 on 03/25/11   Past Surgical History:  Procedure Laterality Date  . BI-VENTRICULAR IMPLANTABLE CARDIOVERTER DEFIBRILLATOR N/A 04/01/2011   Procedure: BI-VENTRICULAR IMPLANTABLE CARDIOVERTER DEFIBRILLATOR  (CRT-D);  Surgeon: Marinus MawGregg W Taylor, MD;  [facility];  Service: Cardiovascular;  Laterality: N/A;  . CARDIAC DEFIBRILLATOR PLACEMENT  12/12   BiV ICD (SJM) implanted by Dr Dave Daniels  . CARDIOVERSION N/A 04/22/2018   Procedure: CARDIOVERSION;  Surgeon: Quintella Daniels, Dave R, MD;  [facility];  Service: Cardiovascular;  Laterality: N/A;  . Carpel tunnel surgery    . CORONARY ARTERY BYPASS GRAFT  3/12   in [place]  . EP IMPLANTABLE DEVICE N/A 08/19/2015   Procedure: BIV ICD Generator Changeout;  Surgeon: Hillis RangeJames  [name], MD;  [facility];  Service: Cardiovascular;  Laterality: N/A;  . INGUINAL HERNIA REPAIR    . TONSILLECTOMY    . UMBILICAL HERNIA REPAIR      Current Outpatient Medications  Medication Sig Dispense Refill  . acetaminophen (TYLENOL) 500 MG tablet Take 1,000 mg by mouth daily as needed for moderate pain or headache.    . allopurinol (ZYLOPRIM) 300 MG tablet Take 300 mg by mouth daily at 2 PM.   11  . apixaban (ELIQUIS) 5 MG TABS tablet Take 1 tablet (5 mg total) by mouth 2 (two) times daily. 60 tablet 6  . carvedilol (COREG) 12.5 MG tablet TAKE 1 TABLET BY MOUTH TWICE DAILY WITH MEALS 180 tablet 2  . colchicine 0.6 MG tablet Take 0.6 mg by mouth daily as needed (gout flare).     . finasteride (PROSCAR) 5 MG tablet Take 5 mg by mouth daily.    . furosemide (LASIX) 40 MG tablet TAKE 1 & 1/2 (ONE & ONE-HALF) TABLETS BY MOUTH ONCE DAILY 135 tablet 1  . indomethacin (INDOCIN) 50 MG capsule Take 50 mg by mouth daily as needed (gout flare).     Dave Daniels. losartan (COZAAR) 25 MG tablet Take 25 mg by mouth daily.   3  . potassium chloride SA (K-DUR,KLOR-CON) 20 MEQ tablet Take 1 tablet (20 mEq total) by mouth daily. 30 tablet 11   No current facility-administered medications for this visit.     Allergies:   Statins; Nsaids; Other; and Tolmetin  Social History: Social History   Socioeconomic History  . Marital status: Married    Spouse name: Not on file  . Number of children: 4  . Years of education: Not on file  . 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 Financial resource strain: Not on file  . Food insecurity:    Worry: Not on file    Inability: Not on file  . Transportation needs:    Medical: Not on file    Non-medical: Not on file  Tobacco Use  . Smoking status: Never Smoker  . Smokeless tobacco: Never Used  Substance and Sexual Activity  . Alcohol use: Yes    Comment: occasional  . Drug use: No  .  Sexual activity: Yes  Lifestyle  . Physical activity:    Days per week: Not on file    Minutes per session: Not on file  . Stress: Not on file  Relationships  . Social connections:    Talks on phone: Not on file    Gets together: Not on file    Attends religious service: Not on file    Active member of club or organization: Not on file    Attends meetings of clubs or organizations: Not on file    Relationship status: Not on file  . Intimate partner violence:    Fear of current or ex partner: Not on file    Emotionally abused: Not on file    Physically abused: Not on file    Forced sexual activity: Not on file  Other Topics Concern  . Not on file  Social History Narrative   [place], [place]  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    Family History: Family History  Problem Relation Age of Onset  . Pancreatic cancer Mother     Review of Systems: All other systems reviewed and are otherwise negative except as noted above.   Physical Exam: VS:  BP 110/68   Pulse 60   Ht 5\' 7"  (1.702 m)   Wt 151 lb 9.6 oz (68.8 kg)   SpO2 98%   BMI 23.74 kg/m  , BMI Body mass index is 23.74 kg/m.  GEN- The patient is well appearing, alert and oriented x 3 today.   HEENT: normocephalic, atraumatic; sclera clear, conjunctiva pink; hearing intact; oropharynx clear; neck supple  Lungs- Clear to ausculation bilaterally, normal work of breathing.  No wheezes, rales, rhonchi Heart- Regular rate and rhythm (paced) GI- soft, non-tender, non-distended, bowel sounds present  Extremities- no clubbing, cyanosis, or edema  MS- no significant deformity or atrophy Skin- warm and dry, no rash or lesion; ICD pocket well healed Psych- euthymic mood, full affect Neuro- strength and sensation are intact  ICD interrogation- reviewed in detail today,  See PACEART report  EKG:  EKG is not ordered today.  Recent Labs: 01/20/2018: BUN 25; Creatinine, Ser 1.54; Platelets 242 04/22/2018:  Hemoglobin 13.6; Potassium 3.7; Sodium 141   Wt Readings from Last 3 Encounters:  05/12/18 151 lb 9.6 oz (68.8 kg)  04/27/18 153 lb 12.8 oz (69.8 kg)  04/22/18 150 lb (68 kg)     Other studies Reviewed: Additional studies/ records that were reviewed today include: Dr Dave Daniels and Dr Ludwig Clarksrenshaw's office notes   Assessment and Plan:  1.  Chronic systolic dysfunction euvolemic today Stable on an appropriate medical regimen Normal ICD function See Pace Art report No changes today  2.  Persistent AF Maintaining SR post cardioversion Continue Eliquis for CHADS2VASC of 4 He has not been symptomatically changed in SR    Current medicines are reviewed at length with the patient today.   The patient does not have concerns regarding his medicines.  The following changes were made today:  none  Labs/ tests ordered today include: none No orders of the defined types were placed in this encounter.    Disposition:   Follow up with Arsenio Katz, ICM clinic, Dr Dave Som as scheduled, me in 1 year    Signed, Gypsy Balsam, NP 05/12/2018 12:30 PM  [facility] [address] [employer] [place] [address] [phone] (office) [phone] (fax)

## 2018-05-12 ENCOUNTER — Ambulatory Visit: Payer: Medicare HMO | Admitting: Nurse Practitioner

## 2018-05-12 ENCOUNTER — Encounter: Payer: Self-pay | Admitting: Nurse Practitioner

## 2018-05-12 VITALS — BP 110/68 | HR 60 | Ht 67.0 in | Wt 151.6 lb

## 2018-05-12 DIAGNOSIS — I4819 Other persistent atrial fibrillation: Secondary | ICD-10-CM | POA: Diagnosis not present

## 2018-05-12 DIAGNOSIS — I5022 Chronic systolic (congestive) heart failure: Secondary | ICD-10-CM

## 2018-05-12 NOTE — Patient Instructions (Signed)
Medication Instructions:  NONE If you need a refill on your cardiac medications before your next appointment, please call your pharmacy.   Lab work: NONE If you have labs (blood work) drawn today and your tests are completely normal, you will receive your results only by: Marland Kitchen MyChart Message (if you have MyChart) OR . A paper copy in the mail If you have any lab test that is abnormal or we need to change your treatment, we will call you to review the results.  Testing/Procedures: NONE  Follow-Up: At [facility], you and your health needs are our priority.  As part of our continuing mission to provide you with exceptional heart care, we have created designated Provider Care Teams.  These Care Teams include your primary Cardiologist (physician) and Advanced Practice Providers (APPs -  Physician Assistants and Nurse Practitioners) who all work together to provide you with the care you need, when you need it. You will need a follow up appointment in 1 years.  Please call our office 2 months in advance to schedule this appointment.  You may see Dr Johney Frame or one of the following Advanced Practice Providers on your designated Care Team:   Gypsy Balsam, NP . Francis Dowse, PA-C  Any Other Special Instructions Will Be Listed Below (If Applicable). Remote monitoring is used to monitor your  ICD from home. This monitoring reduces the number of office visits required to check your device to one time per year. It allows [place] to keep an eye on the functioning of your device to ensure it is working properly. You are scheduled for a device check from home on 07/25/18. You may send your transmission at any time that day. If you have a wireless device, the transmission will be sent automatically. After your physician reviews your transmission, you will receive a postcard with your next transmission date.

## 2018-06-14 ENCOUNTER — Ambulatory Visit (INDEPENDENT_AMBULATORY_CARE_PROVIDER_SITE_OTHER): Payer: Medicare HMO

## 2018-06-14 ENCOUNTER — Telehealth: Payer: Self-pay

## 2018-06-14 DIAGNOSIS — I5022 Chronic systolic (congestive) heart failure: Secondary | ICD-10-CM | POA: Diagnosis not present

## 2018-06-14 DIAGNOSIS — Z95810 Presence of automatic (implantable) cardiac defibrillator: Secondary | ICD-10-CM

## 2018-06-14 NOTE — Telephone Encounter (Signed)
Spoke with patient to remind of missed remote transmission 

## 2018-06-15 NOTE — Progress Notes (Signed)
EPIC Encounter for ICM Monitoring  Patient Name: Dave Daniels is a 78 y.o. male Date: 06/15/2018 Primary Care Physican: Loraine Leriche., MD Primary Cardiologist:Crenshaw Electrophysiologist: [name] Last Weight:151lbs  Bi-V Pacing:>99%  AT/AF Burden: 75%  Spoke with patient. He said he had a really bad cold but starting to feel better.  Cardioversion 04/22/2018.  Report appears to show no AT/AF after 04/27/2018  Thoracic impedancenormal.   Prescribed:Furosemide 40 mg 1.5 tablets (60 mg total) daily. Potassium 20 mEq 1 tablet daily.  Labs: 01/20/2018 Creatinine 1.54. BUN 25, Potassium 3.8, Sodium 137, eGFR 42-48 07/28/2017 Creatinine 1.72, BUN 38, Potassium 4.4, Sodium 144, EGFR 38 Care Everywhere  Recommendations:No changes.   Follow-up plan: ICM clinic phone appointment on4/14/2020 (91 day 4/13).  Copy of ICM check sent to Dr.[name].   3 month ICM trend: 06/14/2018    AT/AF   1 Year ICM trend:       Rosalene Billings, RN 06/15/2018 8:11 AM

## 2018-07-25 ENCOUNTER — Other Ambulatory Visit: Payer: Self-pay

## 2018-07-25 ENCOUNTER — Ambulatory Visit (INDEPENDENT_AMBULATORY_CARE_PROVIDER_SITE_OTHER): Payer: Medicare HMO | Admitting: *Deleted

## 2018-07-25 DIAGNOSIS — I480 Paroxysmal atrial fibrillation: Secondary | ICD-10-CM

## 2018-07-25 DIAGNOSIS — I5022 Chronic systolic (congestive) heart failure: Secondary | ICD-10-CM

## 2018-07-25 LAB — CUP PACEART REMOTE DEVICE CHECK
Battery Remaining Longevity: 42 mo
Battery Remaining Percentage: 49 %
Battery Voltage: 2.92 V
Brady Statistic AP VP Percent: 43 %
Brady Statistic AP VS Percent: 1 %
Brady Statistic AS VP Percent: 57 %
Brady Statistic AS VS Percent: 1 %
Brady Statistic RA Percent Paced: 43 %
Date Time Interrogation Session: 20200413162937
HighPow Impedance: 71 Ohm
HighPow Impedance: 71 Ohm
Implantable Lead Implant Date: 20121219
Implantable Lead Implant Date: 20121219
Implantable Lead Implant Date: 20121219
Implantable Lead Location: 753858
Implantable Lead Location: 753859
Implantable Lead Location: 753860
Implantable Pulse Generator Implant Date: 20170508
Lead Channel Impedance Value: 350 Ohm
Lead Channel Impedance Value: 440 Ohm
Lead Channel Impedance Value: 780 Ohm
Lead Channel Pacing Threshold Amplitude: 0.5 V
Lead Channel Pacing Threshold Amplitude: 0.75 V
Lead Channel Pacing Threshold Amplitude: 2 V
Lead Channel Pacing Threshold Pulse Width: 0.5 ms
Lead Channel Pacing Threshold Pulse Width: 0.5 ms
Lead Channel Pacing Threshold Pulse Width: 0.6 ms
Lead Channel Sensing Intrinsic Amplitude: 11.6 mV
Lead Channel Sensing Intrinsic Amplitude: 4.1 mV
Lead Channel Setting Pacing Amplitude: 2 V
Lead Channel Setting Pacing Amplitude: 2 V
Lead Channel Setting Pacing Amplitude: 2.5 V
Lead Channel Setting Pacing Pulse Width: 0.5 ms
Lead Channel Setting Pacing Pulse Width: 0.6 ms
Lead Channel Setting Sensing Sensitivity: 2 mV
Pulse Gen Serial Number: 7306740

## 2018-07-26 ENCOUNTER — Ambulatory Visit (INDEPENDENT_AMBULATORY_CARE_PROVIDER_SITE_OTHER): Payer: Medicare HMO

## 2018-07-26 ENCOUNTER — Other Ambulatory Visit: Payer: Self-pay

## 2018-07-26 DIAGNOSIS — Z95810 Presence of automatic (implantable) cardiac defibrillator: Secondary | ICD-10-CM

## 2018-07-26 DIAGNOSIS — I5022 Chronic systolic (congestive) heart failure: Secondary | ICD-10-CM | POA: Diagnosis not present

## 2018-07-29 NOTE — Progress Notes (Signed)
EPIC Encounter for ICM Monitoring  Patient Name: Dave Daniels is a 78 y.o. male Date: 07/29/2018 Primary Care Physican: Cheron Schaumann., MD Primary Cardiologist:Crenshaw Electrophysiologist: [name] Last Weight:150lbs  Bi-V Pacing:>99%   Spoke with patient. He reports he is doing well.  Thoracic impedancenormal.   Prescribed:Furosemide 40 mg 1.5 tablets (60 mg total) daily. Potassium 20 mEq 1 tablet daily.  Labs: 01/20/2018 Creatinine 1.54. BUN 25, Potassium 3.8, Sodium 137, GFR 42-48 07/28/2017 Creatinine 1.72, BUN 38, Potassium 4.4, Sodium 144, GFR 38 Care Everywhere  Recommendations:No changes and encouraged to call for fluid symptoms.  Follow-up plan: ICM clinic phone appointment on5/18/2020.  Copy of ICM check sent to Dr.[name].  Direct Trend Viewer shows through 07/27/2018 and at baseline    3 month ICM trend: 07/25/2018    1 Year ICM trend:       Karie Soda, RN 07/29/2018 12:34 PM

## 2018-08-05 NOTE — Progress Notes (Signed)
Remote pacemaker transmission.   

## 2018-08-29 ENCOUNTER — Ambulatory Visit (INDEPENDENT_AMBULATORY_CARE_PROVIDER_SITE_OTHER): Payer: Medicare HMO

## 2018-08-29 ENCOUNTER — Other Ambulatory Visit: Payer: Self-pay

## 2018-08-29 DIAGNOSIS — I5022 Chronic systolic (congestive) heart failure: Secondary | ICD-10-CM

## 2018-08-29 DIAGNOSIS — Z95810 Presence of automatic (implantable) cardiac defibrillator: Secondary | ICD-10-CM | POA: Diagnosis not present

## 2018-09-01 ENCOUNTER — Other Ambulatory Visit (HOSPITAL_COMMUNITY): Payer: Self-pay | Admitting: Nurse Practitioner

## 2018-09-01 NOTE — Telephone Encounter (Signed)
Last OV 05/12/2018 78 years old 68.8kg Scr 1.54 on 01/20/2018 Eliquis 5mg  BID sent to pharmacy

## 2018-09-02 NOTE — Progress Notes (Signed)
EPIC Encounter for ICM Monitoring  Patient Name: Dave Daniels is a 78 y.o. male Date: 09/02/2018 Primary Care Physican: Cheron Schaumann., MD Primary Cardiologist:Crenshaw Electrophysiologist: [name] Last Weight:150lbs  Bi-V Pacing:>99%   Transmission reviewed and results sent via mychart.  Thoracic impedancenormal.   Prescribed:Furosemide 40 mg 1.5 tablets (60 mg total) daily. Potassium 20 mEq 1 tablet daily.  Labs: 01/20/2018 Creatinine 1.54. BUN 25, Potassium 3.8, Sodium 137, GFR 42-48 07/28/2017 Creatinine 1.72, BUN 38, Potassium 4.4, Sodium 144, GFR 38 Care Everywhere  Recommendations: None  Follow-up plan: ICM clinic phone appointment on6/22/2020.  Copy of ICM check sent to Dr.[name].  3 month ICM trend: 08/30/2018    1 Year ICM trend:       Karie Soda, RN 09/02/2018 8:31 AM

## 2018-09-08 ENCOUNTER — Other Ambulatory Visit: Payer: Self-pay | Admitting: Internal Medicine

## 2018-10-03 ENCOUNTER — Ambulatory Visit (INDEPENDENT_AMBULATORY_CARE_PROVIDER_SITE_OTHER): Payer: Medicare HMO

## 2018-10-03 DIAGNOSIS — I5022 Chronic systolic (congestive) heart failure: Secondary | ICD-10-CM | POA: Diagnosis not present

## 2018-10-03 DIAGNOSIS — Z95810 Presence of automatic (implantable) cardiac defibrillator: Secondary | ICD-10-CM

## 2018-10-07 ENCOUNTER — Telehealth: Payer: Self-pay

## 2018-10-07 NOTE — Telephone Encounter (Signed)
Remote ICM transmission received.  Attempted call to patient regarding ICM remote transmission and left detailed message, per DPR, with next ICM remote transmission date of 11/07/2018.  Advised to return call for any fluid symptoms or questions.    

## 2018-10-07 NOTE — Progress Notes (Signed)
EPIC Encounter for ICM Monitoring  Patient Name: Dave Daniels is a 78 y.o. male Date: 10/07/2018 Primary Care Physican: Loraine Leriche., MD Primary Cardiologist:Crenshaw Electrophysiologist: [name] Last Weight:150lbs  Bi-V Pacing:>99%   Attempted call to patient and unable to reach.  Left detailed message per DPR regarding transmission. Transmission reviewed.   Corvue thoracic impedancenormal.   Prescribed:Furosemide 40 mg 1.5 tablets (60 mg total) daily. Potassium 20 mEq 1 tablet daily.  Labs: 01/20/2018 Creatinine 1.54. BUN 25, Potassium 3.8, Sodium 137, GFR 42-48 07/28/2017 Creatinine 1.72, BUN 38, Potassium 4.4, Sodium 144, GFR 38 Care Everywhere  Recommendations: Left voice mail with ICM number and encouraged to call if experiencing any fluid symptoms.  Follow-up plan: ICM clinic phone appointment on7/27/2020.  Copy of ICM check sent to Dr.[name].   3 month ICM trend: 10/03/2018    1 Year ICM trend:       Rosalene Billings, RN 10/07/2018 10:07 AM

## 2018-10-24 ENCOUNTER — Ambulatory Visit (INDEPENDENT_AMBULATORY_CARE_PROVIDER_SITE_OTHER): Payer: Medicare HMO | Admitting: *Deleted

## 2018-10-24 ENCOUNTER — Telehealth: Payer: Self-pay

## 2018-10-24 DIAGNOSIS — I255 Ischemic cardiomyopathy: Secondary | ICD-10-CM | POA: Diagnosis not present

## 2018-10-24 NOTE — Telephone Encounter (Signed)
Left message for patient to remind of missed remote transmission.  

## 2018-10-25 LAB — CUP PACEART REMOTE DEVICE CHECK
Date Time Interrogation Session: 20200714183557
Implantable Lead Implant Date: 20121219
Implantable Lead Implant Date: 20121219
Implantable Lead Implant Date: 20121219
Implantable Lead Location: 753858
Implantable Lead Location: 753859
Implantable Lead Location: 753860
Implantable Pulse Generator Implant Date: 20170508
Pulse Gen Serial Number: 7306740

## 2018-11-01 NOTE — Progress Notes (Signed)
HPI: FU coronary artery disease status post coronary artery bypass and graft as well as ischemic cardiomyopathy. Patient's cardiac history dates back to 2010 when he had his first myocardial infarction. [place] in [place]. He had a second myocardial infarction in March of 2012 and then had coronary artery bypass and graft.Cardiac catheterization was performed in August of 2012. Ejection fraction was 20%. The right coronary and LAD were occluded and there was critical circumflex disease. There was a patent saphenous vein graft to the right coronary artery. The LIMA to the LAD was patent. The saphenous vein graft to the obtuse marginal was also patent.Patient had biventricular ICD placed in December of 2012.Last echocardiogram December 2017 showed ejection fraction 25-30%, grade 1 diastolic dysfunction, mild aortic insufficiency and mild left atrial enlargement.Previously noted to have atrial fibrillation on his device; seen in atrial fibrillation and rate control recommended. However found to have fluid accumulation on FU device check.  Underwent successful cardioversion on 04/22/18. Since last seen,the patient denies any dyspnea on exertion, orthopnea, PND, pedal edema, palpitations, syncope or chest pain.    Current Outpatient Medications  Medication Sig Dispense Refill  . acetaminophen (TYLENOL) 500 MG tablet Take 1,000 mg by mouth daily as needed for moderate pain or headache.    . allopurinol (ZYLOPRIM) 300 MG tablet Take 300 mg by mouth daily at 2 PM.   11  . carvedilol (COREG) 12.5 MG tablet TAKE 1 TABLET BY MOUTH TWICE DAILY WITH MEALS 180 tablet 2  . colchicine 0.6 MG tablet Take 0.6 mg by mouth daily as needed (gout flare).     Marland Kitchen. ELIQUIS 5 MG TABS tablet Take 1 tablet by mouth twice daily 60 tablet 3  . finasteride (PROSCAR) 5 MG tablet Take 5 mg by mouth daily.    . furosemide (LASIX) 40 MG tablet TAKE 1 & 1/2 (ONE & ONE-HALF) TABLETS BY MOUTH ONCE DAILY 180 tablet 1   . losartan (COZAAR) 25 MG tablet Take 25 mg by mouth daily.   3  . potassium chloride SA (K-DUR,KLOR-CON) 20 MEQ tablet Take 1 tablet (20 mEq total) by mouth daily. 30 tablet 11  . meloxicam (MOBIC) 7.5 MG tablet TAKE 1 TABLET BY MOUTH ONCE DAILY. REPLACES INDOMETHACIN     No current facility-administered medications for this visit.      Past Medical History:  Diagnosis Date  . CAD (coronary artery disease)    [place] with stenting in 2010, [place] [facility] [place] [facility].  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  Marland Kitchen. Complete heart block (HCC)   . Gout   . Hyperlipidemia   . Hypertension   . Ischemic cardiomyopathy    EF 15% by echo 11/2010 and still 20-25% by follow-up echo 02/2011, s/p St. Jude Bi-V ICD implantation 04/01/11  . LBBB (left bundle branch block)   . Renal insufficiency    Cr 1.6 on 03/25/11    Past Surgical History:  Procedure Laterality Date  . BI-VENTRICULAR IMPLANTABLE CARDIOVERTER DEFIBRILLATOR N/A 04/01/2011   Procedure: BI-VENTRICULAR IMPLANTABLE CARDIOVERTER DEFIBRILLATOR  (CRT-D);  Surgeon: Marinus MawGregg W Taylor, MD;  [facility];  Service: Cardiovascular;  Laterality: N/A;  . CARDIAC DEFIBRILLATOR PLACEMENT  12/12   BiV ICD (SJM) implanted by Dr Johney FrameAllred  . CARDIOVERSION N/A 04/22/2018   Procedure: CARDIOVERSION;  Surgeon: Quintella Reicherturner, Traci R, MD;  [facility];  Service: Cardiovascular;  Laterality: N/A;  . Carpel tunnel surgery    . CORONARY ARTERY BYPASS GRAFT  3/12   in [place]  . EP IMPLANTABLE DEVICE N/A 08/19/2015   Procedure: BIV ICD Generator Changeout;  Surgeon: Hillis RangeJames [name], MD;  [facility];  Service: Cardiovascular;  Laterality: N/A;  . INGUINAL HERNIA REPAIR    . TONSILLECTOMY    . UMBILICAL HERNIA REPAIR      Social History   Socioeconomic History  . Marital status: Married    Spouse name: Not on file  . Number of children: 4  . Years of education: Not on file  . 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 Financial resource strain: Not on file  . Food insecurity    Worry: Not on file    Inability: Not on file  . Transportation needs    Medical: Not on file    Non-medical: Not on file  Tobacco Use  . Smoking status: Never Smoker  . Smokeless tobacco: Never Used  Substance and Sexual Activity  . Alcohol use: Yes    Comment: occasional  . Drug use: No  . Sexual activity: Yes  Lifestyle  . Physical activity    Days per week: Not on file    Minutes per session: Not on file  . Stress: Not on file  Relationships  . Social Musicianconnections    Talks on phone: Not on file    Gets together: Not on file    Attends religious service: Not on file    Active member of club or organization: Not on file    Attends meetings of clubs or organizations: Not on file    Relationship status: Not on file  . Intimate partner violence    Fear of current or ex partner: Not on file    Emotionally abused: Not on file    Physically abused: Not on file    Forced sexual activity: Not on file  Other Topics Concern  . Not on file  Social History Narrative   [place] in [place], [place] moved from [place].  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    Family History  Problem Relation Age of Onset  . Pancreatic cancer Mother     ROS: no fevers or chills, productive cough, hemoptysis, dysphasia, odynophagia, melena, hematochezia, dysuria, hematuria, rash, seizure activity, orthopnea, PND, pedal edema, claudication. Remaining systems are negative.  Physical Exam: Well-developed well-nourished in no acute distress.  Skin is warm and dry.  HEENT is normal.  Neck is supple.  Chest is clear to auscultation with normal expansion.  Cardiovascular exam is regular rate and rhythm.  Abdominal exam nontender or distended. No masses palpated. Extremities show no edema. neuro grossly intact  ECG- personally reviewed  A/P  1 coronary artery disease-patient denies chest  pain.  Plan to continue medical therapy.  He is intolerant to statins.  He is not on aspirin given need for anticoagulation.  2 paroxysmal atrial fibrillation-Continue apixaban and carvedilol.  He was not particularly symptomatic at time of previous atrial fibrillation.  Therefore if atrial fibrillation recurs best option likely rate control and anticoagulation.  3 hypertension-patient's blood pressure is controlled.  Continue present medications and follow.  4 hyperlipidemia-patient has been intolerant to statins.  He will forward his most recent lipids to us.  We will add Zetia or Repatha if he is agreeable.  5 chronic systolic congestive heart failure-his volume status appears to be stable today.  Continue Lasix, fluid restriction and low-sodium diet.  Check potassium and renal function.  6 ischemic cardiomyopathy-we will continue with present dose of ARB and beta-blocker.  He has declined Entresto previously.  Kirk Ruths, MD

## 2018-11-04 NOTE — Progress Notes (Signed)
Remote ICD transmission.   

## 2018-11-07 ENCOUNTER — Ambulatory Visit (INDEPENDENT_AMBULATORY_CARE_PROVIDER_SITE_OTHER): Payer: Medicare HMO

## 2018-11-07 DIAGNOSIS — Z95810 Presence of automatic (implantable) cardiac defibrillator: Secondary | ICD-10-CM

## 2018-11-07 DIAGNOSIS — I5022 Chronic systolic (congestive) heart failure: Secondary | ICD-10-CM | POA: Diagnosis not present

## 2018-11-09 ENCOUNTER — Ambulatory Visit (INDEPENDENT_AMBULATORY_CARE_PROVIDER_SITE_OTHER): Payer: Medicare HMO | Admitting: Cardiology

## 2018-11-09 ENCOUNTER — Telehealth: Payer: Self-pay

## 2018-11-09 ENCOUNTER — Encounter: Payer: Self-pay | Admitting: Cardiology

## 2018-11-09 ENCOUNTER — Other Ambulatory Visit: Payer: Self-pay

## 2018-11-09 VITALS — BP 112/68 | HR 68 | Ht 67.0 in | Wt 159.1 lb

## 2018-11-09 DIAGNOSIS — E7800 Pure hypercholesterolemia, unspecified: Secondary | ICD-10-CM

## 2018-11-09 DIAGNOSIS — I480 Paroxysmal atrial fibrillation: Secondary | ICD-10-CM

## 2018-11-09 DIAGNOSIS — I255 Ischemic cardiomyopathy: Secondary | ICD-10-CM | POA: Diagnosis not present

## 2018-11-09 DIAGNOSIS — I5022 Chronic systolic (congestive) heart failure: Secondary | ICD-10-CM

## 2018-11-09 NOTE — Patient Instructions (Signed)
Medication Instructions:  NO CHANGE If you need a refill on your cardiac medications before your next appointment, please call your pharmacy.   Lab work: Your physician recommends that you HAVE LAB WORK TODAY If you have labs (blood work) drawn today and your tests are completely normal, you will receive your results only by: . MyChart Message (if you have MyChart) OR . A paper copy in the mail If you have any lab test that is abnormal or we need to change your treatment, we will call you to review the results.  Follow-Up: At [facility], you and your health needs are our priority.  As part of our continuing mission to provide you with exceptional heart care, we have created designated Provider Care Teams.  These Care Teams include your primary Cardiologist (physician) and Advanced Practice Providers (APPs -  Physician Assistants and Nurse Practitioners) who all work together to provide you with the care you need, when you need it. . Your physician recommends that you schedule a follow-up appointment in: 6 MONTHS WITH DR CRENSHAW      

## 2018-11-09 NOTE — Progress Notes (Signed)
EPIC Encounter for ICM Monitoring  Patient Name: Dave Daniels is a 78 y.o. male Date: 11/09/2018 Primary Care Physican: Loraine Leriche., MD Primary Cardiologist:Crenshaw Electrophysiologist: [name] Last Weight:150lbs  Bi-V Pacing:>99%   Attempted call to patient and unable to reach.  Left detailed message per DPR regarding transmission. Transmission reviewed.   Corvue thoracic impedancenormal.   Prescribed:Furosemide 40 mg 1.5 tablets (60 mg total) daily. Potassium 20 mEq 1 tablet daily.  Recommendations:Left voice mail with ICM number and encouraged to call if experiencing any fluid symptoms.  Follow-up plan: ICM clinic phone appointment on8/31/2020.  Copy of ICM check sent to Dr.[name].   3 month ICM trend: 11/07/2018    1 Year ICM trend:       Rosalene Billings, RN 11/09/2018 2:08 PM

## 2018-11-09 NOTE — Telephone Encounter (Signed)
Remote ICM transmission received.  Attempted call to patient regarding ICM remote transmission and left detailed message, per DPR, with next ICM remote transmission date of 12/12/2018.  Advised to return call for any fluid symptoms or questions.    

## 2018-11-10 ENCOUNTER — Telehealth: Payer: Self-pay | Admitting: *Deleted

## 2018-11-10 DIAGNOSIS — I5022 Chronic systolic (congestive) heart failure: Secondary | ICD-10-CM

## 2018-11-10 LAB — BASIC METABOLIC PANEL
BUN/Creatinine Ratio: 19 (ref 10–24)
BUN: 33 mg/dL — ABNORMAL HIGH (ref 8–27)
CO2: 22 mmol/L (ref 20–29)
Calcium: 9.6 mg/dL (ref 8.6–10.2)
Chloride: 101 mmol/L (ref 96–106)
Creatinine, Ser: 1.71 mg/dL — ABNORMAL HIGH (ref 0.76–1.27)
GFR calc Af Amer: 44 mL/min/{1.73_m2} — ABNORMAL LOW (ref >59)
GFR calc non Af Amer: 38 mL/min/{1.73_m2} — ABNORMAL LOW (ref >59)
Glucose: 80 mg/dL (ref 65–99)
Potassium: 4.2 mmol/L (ref 3.5–5.2)
Sodium: 139 mmol/L (ref 134–144)

## 2018-11-10 MED ORDER — FUROSEMIDE 40 MG PO TABS: 40.0000 mg | ORAL_TABLET | Freq: Every day | ORAL | 1 refills | Status: DC

## 2018-11-10 NOTE — Telephone Encounter (Addendum)
My chart message to patient and Lab orders mailed.   ----- Message from Lelon Perla, MD sent at 11/10/2018  7:14 AM EDT ----- Change lasix to 40 mg daily instead of 60; bmet 2 weeks Kirk Ruths

## 2018-11-22 ENCOUNTER — Telehealth: Payer: Self-pay | Admitting: Cardiology

## 2018-11-22 DIAGNOSIS — I255 Ischemic cardiomyopathy: Secondary | ICD-10-CM

## 2018-11-22 DIAGNOSIS — I2583 Coronary atherosclerosis due to lipid rich plaque: Secondary | ICD-10-CM

## 2018-11-22 DIAGNOSIS — I2510 Atherosclerotic heart disease of native coronary artery without angina pectoris: Secondary | ICD-10-CM

## 2018-11-22 DIAGNOSIS — I480 Paroxysmal atrial fibrillation: Secondary | ICD-10-CM

## 2018-11-22 NOTE — Telephone Encounter (Signed)
  Patient wants to know if his blood count will be included in the lab work he is doing tomorrow. He is taking medication that can affect that and he wants it checked.

## 2018-11-22 NOTE — Telephone Encounter (Signed)
Returned call to patient he stated he would like a cbc added to lab work to be done 11/23/18.Advised I will place order for a cbc.

## 2018-11-24 LAB — CBC WITH DIFFERENTIAL/PLATELET
Basophils Absolute: 0 10*3/uL (ref 0.0–0.2)
Basos: 1 %
EOS (ABSOLUTE): 0.1 10*3/uL (ref 0.0–0.4)
Eos: 2 %
Hematocrit: 40.2 % (ref 37.5–51.0)
Hemoglobin: 13.1 g/dL (ref 13.0–17.7)
Immature Grans (Abs): 0 10*3/uL (ref 0.0–0.1)
Immature Granulocytes: 0 %
Lymphocytes Absolute: 0.7 10*3/uL (ref 0.7–3.1)
Lymphs: 12 %
MCH: 29.2 pg (ref 26.6–33.0)
MCHC: 32.6 g/dL (ref 31.5–35.7)
MCV: 90 fL (ref 79–97)
Monocytes Absolute: 0.2 10*3/uL (ref 0.1–0.9)
Monocytes: 4 %
Neutrophils Absolute: 4.6 10*3/uL (ref 1.4–7.0)
Neutrophils: 81 %
Platelets: 236 10*3/uL (ref 150–450)
RBC: 4.48 x10E6/uL (ref 4.14–5.80)
RDW: 14.1 % (ref 11.6–15.4)
WBC: 5.6 10*3/uL (ref 3.4–10.8)

## 2018-11-24 LAB — BASIC METABOLIC PANEL
BUN/Creatinine Ratio: 17 (ref 10–24)
BUN: 27 mg/dL (ref 8–27)
CO2: 23 mmol/L (ref 20–29)
Calcium: 10.1 mg/dL (ref 8.6–10.2)
Chloride: 105 mmol/L (ref 96–106)
Creatinine, Ser: 1.59 mg/dL — ABNORMAL HIGH (ref 0.76–1.27)
GFR calc Af Amer: 48 mL/min/{1.73_m2} — ABNORMAL LOW (ref >59)
GFR calc non Af Amer: 41 mL/min/{1.73_m2} — ABNORMAL LOW (ref >59)
Glucose: 110 mg/dL — ABNORMAL HIGH (ref 65–99)
Potassium: 4.5 mmol/L (ref 3.5–5.2)
Sodium: 143 mmol/L (ref 134–144)

## 2018-11-28 ENCOUNTER — Other Ambulatory Visit: Payer: Self-pay | Admitting: Cardiology

## 2018-12-12 ENCOUNTER — Ambulatory Visit (INDEPENDENT_AMBULATORY_CARE_PROVIDER_SITE_OTHER): Payer: Medicare HMO

## 2018-12-12 DIAGNOSIS — I5022 Chronic systolic (congestive) heart failure: Secondary | ICD-10-CM

## 2018-12-12 DIAGNOSIS — Z95810 Presence of automatic (implantable) cardiac defibrillator: Secondary | ICD-10-CM

## 2018-12-13 NOTE — Progress Notes (Signed)
EPIC Encounter for ICM Monitoring  Patient Name: SONYA PUCCI is a 78 y.o. male Date: 12/13/2018 Primary Care Physican: Loraine Leriche., MD Primary Cardiologist:Crenshaw Electrophysiologist: [name] 12/14/2018 Weight:153lbs  Bi-V Pacing:>99%   Spoke with patient.  He said he is doing fine and denies any fluid symptoms.      Corvue thoracic impedancenormal.   Prescribed:Furosemide 40 mg 1 tablet (40 mg total) daily. Potassium 20 mEq 1 tablet daily.  Labs: 11/23/2018 Creatinine 1.59, BUN 27, Potassium 4.5, Sodium 143, GFR 41-48 11/09/2018 Creatinine 1.71, BUN 33, Potassium 4.2, Sodium 139, GFR 38-44  A complete set of results can be found in Results Review.  Recommendations:No changes and encouraged to call if experiencing any fluid symptoms.  Follow-up plan: ICM clinic phone appointment on 01/25/2019.   91 day device clinic remote transmission 01/24/2019.    Copy of ICM check sent to Dr. Rayann Heman.   3 month ICM trend: 12/12/2018    1 Year ICM trend:       Rosalene Billings, RN 12/13/2018 11:32 AM

## 2019-01-03 ENCOUNTER — Telehealth: Payer: Self-pay | Admitting: *Deleted

## 2019-01-03 NOTE — Telephone Encounter (Signed)
Pt states he has an ingrown toenail and bunion that are bothering him on the same foot. Pt asked if we accepted [facility] PPO. I asked A. [place] and she stated we did accept and I transferred pt to her to schedule.

## 2019-01-03 NOTE — Telephone Encounter (Signed)
Pt states he is having a problem with an ingrown toenail.

## 2019-01-16 ENCOUNTER — Other Ambulatory Visit (HOSPITAL_COMMUNITY): Payer: Self-pay | Admitting: Internal Medicine

## 2019-01-16 ENCOUNTER — Ambulatory Visit: Payer: Self-pay | Admitting: Podiatry

## 2019-01-16 ENCOUNTER — Telehealth: Payer: Self-pay | Admitting: *Deleted

## 2019-01-16 NOTE — Telephone Encounter (Signed)
Eliquis 5mg  refill request received; pt is 78 yrs old, weight-72.2kg, Crea-1.59 on 11/23/2018, Diagnosis-Afib, and last seen by Dr. Stanford Breed on 11/09/2018. Dose is appropriate based on dosing criteria. Will send in refill to requested pharmacy.

## 2019-01-16 NOTE — Telephone Encounter (Signed)
Pt called states he has an appt on 01/18/2019 and has an question about a medication.

## 2019-01-16 NOTE — Telephone Encounter (Signed)
I spoke with pt and he asked if he should stop the eliquis prior to possibly having an ingrown toenail procedure. I told pt to continue the eliquis, we generally do no stop pt's from stopping, he just may have little more bleeding after the procedure than someone not on a blood thinner. Pt asked if he should bring a looser shoe and I told him yes.

## 2019-01-18 ENCOUNTER — Other Ambulatory Visit: Payer: Self-pay

## 2019-01-18 ENCOUNTER — Encounter: Payer: Self-pay | Admitting: Podiatry

## 2019-01-18 ENCOUNTER — Ambulatory Visit (INDEPENDENT_AMBULATORY_CARE_PROVIDER_SITE_OTHER): Payer: Medicare HMO

## 2019-01-18 ENCOUNTER — Ambulatory Visit: Payer: Medicare HMO | Admitting: Podiatry

## 2019-01-18 VITALS — BP 160/84 | HR 60

## 2019-01-18 DIAGNOSIS — M21612 Bunion of left foot: Secondary | ICD-10-CM

## 2019-01-18 DIAGNOSIS — L600 Ingrowing nail: Secondary | ICD-10-CM | POA: Diagnosis not present

## 2019-01-18 NOTE — Patient Instructions (Signed)

## 2019-01-22 NOTE — Progress Notes (Signed)
   Subjective: Patient presents today for evaluation of intermittent aching pain to the medial border of the left great toe that began about two months ago. Patient is concerned for possible ingrown nail. Walking increases the pain. He has been applying Vick's vapor rub and bandaging the toe for treatment. Patient presents today for further treatment and evaluation.  Past Medical History:  Diagnosis Date  . CAD (coronary artery disease)    MI in [place] with stenting in 2010, then MI with CABG in [facility] 06/2010.  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  Marland Kitchen Complete heart block ([place])   . Gout   . Hyperlipidemia   . Hypertension   . Ischemic cardiomyopathy    EF 15% by echo 11/2010 and still 20-25% by follow-up echo 02/2011, s/p St. Jude Bi-V ICD implantation 04/01/11  . LBBB (left bundle branch block)   . Renal insufficiency    Cr 1.6 on 03/25/11    Objective:  General: Well developed, nourished, in no acute distress, alert and oriented x3   Dermatology: Skin is warm, dry and supple bilateral. Medial border of the left great toe appears to be erythematous with evidence of an ingrowing nail. Pain on palpation noted to the border of the nail fold. The remaining nails appear unremarkable at this time. There are no open sores, lesions.  Vascular: Dorsalis Pedis artery and Posterior Tibial artery pedal pulses palpable. No lower extremity edema noted.   Neruologic: Grossly intact via light touch bilateral.  Musculoskeletal: Muscular strength within normal limits in all groups bilateral. Normal range of motion noted to all pedal and ankle joints.   Radiographic Exam:  Normal osseous mineralization. Joint spaces preserved. No fracture/dislocation/boney destruction.    Assesement: #1 Paronychia with ingrowing nail medial border left hallux #2 Pain in toe #3 Incurvated nail  Plan of Care:  1. Patient evaluated. X-Rays reviewed. 2. Discussed treatment alternatives and plan of care. Explained nail  avulsion procedure and post procedure course to patient. 3. Patient opted for permanent partial nail avulsion of the medial border left hallux.  4. Prior to procedure, local anesthesia infiltration utilized using 3 ml of a 50:50 mixture of 2% plain lidocaine and 0.5% plain marcaine in a normal hallux block fashion and a betadine prep performed.  5. Partial permanent nail avulsion with chemical matrixectomy performed using [id number] applications of phenol followed by alcohol flush.  6. Light dressing applied. 7. Prescription for Gentamicin cream provided to patient to use daily with a bandage.  8. Return to clinic in 2 weeks.  Referred by Dr. Tyler Pita. From [place].   Edrick Kins, DPM [facility]  Dr. Edrick Kins, [address]                                        [place], [place] [address]                Office [phone]  Fax [phone]

## 2019-01-24 ENCOUNTER — Ambulatory Visit (INDEPENDENT_AMBULATORY_CARE_PROVIDER_SITE_OTHER): Payer: Medicare HMO | Admitting: *Deleted

## 2019-01-24 DIAGNOSIS — I255 Ischemic cardiomyopathy: Secondary | ICD-10-CM

## 2019-01-24 DIAGNOSIS — I4819 Other persistent atrial fibrillation: Secondary | ICD-10-CM | POA: Diagnosis not present

## 2019-01-24 LAB — CUP PACEART REMOTE DEVICE CHECK
Battery Remaining Longevity: 36 mo
Battery Remaining Percentage: 42 %
Battery Voltage: 2.9 V
Brady Statistic AP VP Percent: 46 %
Brady Statistic AP VS Percent: 1 %
Brady Statistic AS VP Percent: 53 %
Brady Statistic AS VS Percent: 1 %
Brady Statistic RA Percent Paced: 46 %
Date Time Interrogation Session: 20201013123618
HighPow Impedance: 72 Ohm
HighPow Impedance: 72 Ohm
Implantable Lead Implant Date: 20121219
Implantable Lead Implant Date: 20121219
Implantable Lead Implant Date: 20121219
Implantable Lead Location: 753858
Implantable Lead Location: 753859
Implantable Lead Location: 753860
Implantable Pulse Generator Implant Date: 20170508
Lead Channel Impedance Value: 410 Ohm
Lead Channel Impedance Value: 440 Ohm
Lead Channel Impedance Value: 780 Ohm
Lead Channel Pacing Threshold Amplitude: 0.625 V
Lead Channel Pacing Threshold Amplitude: 0.75 V
Lead Channel Pacing Threshold Amplitude: 2 V
Lead Channel Pacing Threshold Pulse Width: 0.5 ms
Lead Channel Pacing Threshold Pulse Width: 0.5 ms
Lead Channel Pacing Threshold Pulse Width: 0.6 ms
Lead Channel Sensing Intrinsic Amplitude: 12 mV
Lead Channel Sensing Intrinsic Amplitude: 4.4 mV
Lead Channel Setting Pacing Amplitude: 2 V
Lead Channel Setting Pacing Amplitude: 2 V
Lead Channel Setting Pacing Amplitude: 2.5 V
Lead Channel Setting Pacing Pulse Width: 0.5 ms
Lead Channel Setting Pacing Pulse Width: 0.6 ms
Lead Channel Setting Sensing Sensitivity: 2 mV
Pulse Gen Serial Number: 7306740

## 2019-01-25 ENCOUNTER — Ambulatory Visit (INDEPENDENT_AMBULATORY_CARE_PROVIDER_SITE_OTHER): Payer: Medicare HMO

## 2019-01-25 DIAGNOSIS — I5022 Chronic systolic (congestive) heart failure: Secondary | ICD-10-CM

## 2019-01-25 DIAGNOSIS — Z95810 Presence of automatic (implantable) cardiac defibrillator: Secondary | ICD-10-CM

## 2019-01-27 NOTE — Progress Notes (Signed)
EPIC Encounter for ICM Monitoring  Patient Name: Dave Daniels is a 78 y.o. male Date: 01/27/2019 Primary Care Physican: Loraine Leriche., MD Primary Cardiologist:Crenshaw Electrophysiologist: [name] 12/14/2018 Weight:153lbs  Bi-V Pacing:>99%   Spoke with patient.  He said he is doing fine and denies any fluid symptoms.      Corvue thoracic impedancenormal.   Prescribed:Furosemide 40 mg 1 tablet (40 mg total) daily. Potassium 20 mEq 1 tablet daily.  Labs: 11/23/2018 Creatinine 1.59, BUN 27, Potassium 4.5, Sodium 143, GFR 41-48 11/09/2018 Creatinine 1.71, BUN 33, Potassium 4.2, Sodium 139, GFR 38-44  A complete set of results can be found in Results Review  Recommendations: No changes and encouraged to call if experiencing any fluid symptoms.  Follow-up plan: ICM clinic phone appointment on 02/27/2019.   91 day device clinic remote transmission 04/25/2019.      Copy of ICM check sent to Dr. Rayann Heman.   3 month ICM trend: 01/24/2019    1 Year ICM trend:       Rosalene Billings, RN 01/27/2019 2:23 PM

## 2019-01-31 DIAGNOSIS — R3129 Other microscopic hematuria: Secondary | ICD-10-CM | POA: Insufficient documentation

## 2019-02-01 ENCOUNTER — Other Ambulatory Visit: Payer: Self-pay

## 2019-02-01 ENCOUNTER — Ambulatory Visit (INDEPENDENT_AMBULATORY_CARE_PROVIDER_SITE_OTHER): Payer: Medicare HMO | Admitting: Podiatry

## 2019-02-01 DIAGNOSIS — M21612 Bunion of left foot: Secondary | ICD-10-CM | POA: Diagnosis not present

## 2019-02-01 DIAGNOSIS — L600 Ingrowing nail: Secondary | ICD-10-CM | POA: Diagnosis not present

## 2019-02-03 NOTE — Progress Notes (Signed)
Remote ICD transmission.   

## 2019-02-05 NOTE — Progress Notes (Signed)
   Subjective: 78 y.o. male presents today status post permanent nail avulsion procedure of the medial border of the left hallux that was performed on 01/18/2019. He states he is doing well. He denies any pain or drainage. He has been soaking the toe for treatment. Patient is here for further evaluation and treatment.    Past Medical History:  Diagnosis Date  . CAD (coronary artery disease)    MI in [place] with stenting in 2010, then MI with CABG in [facility] 06/2010.  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  Marland Kitchen Complete heart block ([place])   . Gout   . Hyperlipidemia   . Hypertension   . Ischemic cardiomyopathy    EF 15% by echo 11/2010 and still 20-25% by follow-up echo 02/2011, s/p St. Jude Bi-V ICD implantation 04/01/11  . LBBB (left bundle branch block)   . Renal insufficiency    Cr 1.6 on 03/25/11    Objective: Skin is warm, dry and supple. Nail and respective nail fold appears to be healing appropriately. Open wound to the associated nail fold with a granular wound base and moderate amount of fibrotic tissue. Minimal drainage noted. Mild erythema around the periungual region likely due to phenol chemical matricectomy.  Assessment: #1 postop permanent partial nail avulsion medial border left hallux  #2 open wound periungual nail fold of respective digit.   Plan of care: #1 patient was evaluated  #2 debridement of open wound was performed to the periungual border of the respective toe using a currette. Antibiotic ointment and Band-Aid was applied. #3 patient is to return to clinic on a PRN basis.   Edrick Kins, DPM [facility]  Dr. Edrick Kins, [place]                                        [place], [place] [address]                Office [phone]  Fax [phone]

## 2019-02-27 ENCOUNTER — Ambulatory Visit (INDEPENDENT_AMBULATORY_CARE_PROVIDER_SITE_OTHER): Payer: Medicare HMO

## 2019-02-27 DIAGNOSIS — Z95810 Presence of automatic (implantable) cardiac defibrillator: Secondary | ICD-10-CM

## 2019-02-27 DIAGNOSIS — I5022 Chronic systolic (congestive) heart failure: Secondary | ICD-10-CM | POA: Diagnosis not present

## 2019-03-01 NOTE — Progress Notes (Signed)
EPIC Encounter for ICM Monitoring  Patient Name: Dave Daniels is a 78 y.o. male Date: 03/01/2019 Primary Care Physican: Loraine Leriche., MD Primary Cardiologist:Crenshaw Electrophysiologist: [name] 9/2/2020Weight:153lbs  Bi-V Pacing:>99%  Attempted call to patient and unable to reach.  Left detailed message per DPR regarding transmission. Transmission reviewed.   Corvue thoracic impedancenormal.   Prescribed:Furosemide 40 mg 1 tablet (40 mg total) daily. Potassium 20 mEq 1 tablet daily.  Labs: 11/23/2018 Creatinine1.59Glory Buff, Potassium4.5, [id number], HUD14-97 11/09/2018 Creatinine1.71, BUN33, Potassium4.2, [id number], [id number] A complete set of results can be found in Results Review  Recommendations: Left voice mail with ICM number and encouraged to call if experiencing any fluid symptoms.  Follow-up plan: ICM clinic phone appointment on 04/03/2019.   91 day device clinic remote transmission 04/25/2019.    Copy of ICM check sent to Dr. Rayann Heman.   3 month ICM trend: 02/27/2019    1 Year ICM trend:       Rosalene Billings, RN 03/01/2019 2:14 PM

## 2019-04-03 ENCOUNTER — Telehealth: Payer: Self-pay

## 2019-04-03 ENCOUNTER — Ambulatory Visit (INDEPENDENT_AMBULATORY_CARE_PROVIDER_SITE_OTHER): Payer: Medicare HMO

## 2019-04-03 DIAGNOSIS — I5022 Chronic systolic (congestive) heart failure: Secondary | ICD-10-CM | POA: Diagnosis not present

## 2019-04-03 DIAGNOSIS — Z95810 Presence of automatic (implantable) cardiac defibrillator: Secondary | ICD-10-CM

## 2019-04-03 NOTE — Telephone Encounter (Signed)
Remote ICM transmission received.  Attempted call to patient regarding ICM remote transmission and left detailed message per DPR to return call.  Advised to return call for any fluid symptoms or questions.      

## 2019-04-03 NOTE — Progress Notes (Signed)
EPIC Encounter for ICM Monitoring  Patient Name: Dave Daniels is a 78 y.o. male Date: 04/03/2019 Primary Care Physican: Loraine Leriche., MD Primary Cardiologist:Crenshaw Electrophysiologist: [name] 9/2/2020Weight:153lbs  Bi-V Pacing:>99%  Attempted call to patient and unable to reach.  Left detailed message per DPR regarding transmission. Transmission reviewed.   Corvue thoracic impedancesuggesting possible ongoing fluid accumulation since 04/02/2019. Impedance also suggested fluid accumulation from 11/27 - 12/6.  Prescribed:Furosemide 40 mg 1 tablet (40 mg total) daily. Potassium 20 mEq 1 tablet daily.  Labs: 11/23/2018 Creatinine1.59Glory Buff, Potassium4.5, JQGBEE100, FHQ19-75 11/09/2018 Creatinine1.71, BUN33, Potassium4.2, [id number], [id number] A complete set of results can be found in Results Review  Recommendations: Left voice mail with ICM number and encouraged to call if experiencing any fluid symptoms.  Follow-up plan: ICM clinic phone appointment on 04/11/2019 to recheck fluid levels.   91 day device clinic remote transmission 04/25/2019.   Copy of ICM check sent to Dr. Rayann Heman and Dr Stanford Breed.   3 month ICM trend: 04/03/2019    1 Year ICM trend:       Rosalene Billings, RN 04/03/2019 3:23 PM

## 2019-04-11 ENCOUNTER — Ambulatory Visit (INDEPENDENT_AMBULATORY_CARE_PROVIDER_SITE_OTHER): Payer: Medicare HMO

## 2019-04-11 DIAGNOSIS — I5022 Chronic systolic (congestive) heart failure: Secondary | ICD-10-CM

## 2019-04-11 DIAGNOSIS — Z95810 Presence of automatic (implantable) cardiac defibrillator: Secondary | ICD-10-CM

## 2019-04-12 NOTE — Progress Notes (Signed)
EPIC Encounter for ICM Monitoring  Patient Name: Dave Daniels is a 78 y.o. male Date: 04/12/2019 Primary Care Physican: Loraine Leriche., MD Primary Cardiologist:Crenshaw Electrophysiologist: [name] LastWeight:153lbs  Bi-V Pacing:>99%  Transmission reviewed.  Corvue thoracic impedancereturned to normal since 12/21 remote transmission.  Prescribed:Furosemide 40 mg 1 tablet (40 mg total) daily. Potassium 20 mEq 1 tablet daily.  Labs: 11/23/2018 Creatinine1.59Glory Buff, Potassium4.5, [id number], AYT01-60 11/09/2018 Creatinine1.71, BUN33, Potassium4.2, [id number], [id number] A complete set of results can be found in Results Review  Recommendations:  None  Follow-up plan: ICM clinic phone appointment on 05/08/2019.   91 day device clinic remote transmission 04/25/2019.   Copy of ICM check sent to Dr. Rayann Heman.  3 month ICM trend: 04/11/2019    1 Year ICM trend:       Rosalene Billings, RN 04/12/2019 12:55 PM

## 2019-04-25 ENCOUNTER — Ambulatory Visit (INDEPENDENT_AMBULATORY_CARE_PROVIDER_SITE_OTHER): Payer: Medicare HMO | Admitting: *Deleted

## 2019-04-25 DIAGNOSIS — I4819 Other persistent atrial fibrillation: Secondary | ICD-10-CM | POA: Diagnosis not present

## 2019-04-25 LAB — CUP PACEART REMOTE DEVICE CHECK
Battery Remaining Longevity: 32 mo
Battery Remaining Percentage: 38 %
Battery Voltage: 2.9 V
Brady Statistic AP VP Percent: 47 %
Brady Statistic AP VS Percent: 1 %
Brady Statistic AS VP Percent: 52 %
Brady Statistic AS VS Percent: 1 %
Brady Statistic RA Percent Paced: 47 %
Date Time Interrogation Session: 20210112091448
HighPow Impedance: 70 Ohm
HighPow Impedance: 70 Ohm
Implantable Lead Implant Date: 20121219
Implantable Lead Implant Date: 20121219
Implantable Lead Implant Date: 20121219
Implantable Lead Location: 753858
Implantable Lead Location: 753859
Implantable Lead Location: 753860
Implantable Pulse Generator Implant Date: 20170508
Lead Channel Impedance Value: 340 Ohm
Lead Channel Impedance Value: 440 Ohm
Lead Channel Impedance Value: 750 Ohm
Lead Channel Pacing Threshold Amplitude: 0.5 V
Lead Channel Pacing Threshold Amplitude: 0.75 V
Lead Channel Pacing Threshold Amplitude: 2.5 V
Lead Channel Pacing Threshold Pulse Width: 0.5 ms
Lead Channel Pacing Threshold Pulse Width: 0.5 ms
Lead Channel Pacing Threshold Pulse Width: 0.6 ms
Lead Channel Sensing Intrinsic Amplitude: 12 mV
Lead Channel Sensing Intrinsic Amplitude: 4 mV
Lead Channel Setting Pacing Amplitude: 2 V
Lead Channel Setting Pacing Amplitude: 2 V
Lead Channel Setting Pacing Amplitude: 3 V
Lead Channel Setting Pacing Pulse Width: 0.5 ms
Lead Channel Setting Pacing Pulse Width: 0.6 ms
Lead Channel Setting Sensing Sensitivity: 2 mV
Pulse Gen Serial Number: 7306740

## 2019-05-08 ENCOUNTER — Ambulatory Visit (INDEPENDENT_AMBULATORY_CARE_PROVIDER_SITE_OTHER): Payer: Medicare HMO

## 2019-05-08 DIAGNOSIS — I5022 Chronic systolic (congestive) heart failure: Secondary | ICD-10-CM

## 2019-05-08 DIAGNOSIS — Z95810 Presence of automatic (implantable) cardiac defibrillator: Secondary | ICD-10-CM

## 2019-05-09 ENCOUNTER — Telehealth: Payer: Self-pay

## 2019-05-09 NOTE — Telephone Encounter (Signed)
Left message for patient to remind of missed remote transmission.  

## 2019-05-10 ENCOUNTER — Telehealth: Payer: Self-pay | Admitting: Emergency Medicine

## 2019-05-10 ENCOUNTER — Telehealth: Payer: Self-pay

## 2019-05-10 NOTE — Telephone Encounter (Signed)
Received alert that patient is in AF.+ Eliquis, asymptomatic. Patient has video visit with Dr Johney Frame on 05/15/19 and encouraged to keep it.

## 2019-05-12 NOTE — Progress Notes (Signed)
EPIC Encounter for ICM Monitoring  Patient Name: Dave Daniels is a 79 y.o. male Date: 05/12/2019 Primary Care Physican: Cheron Schaumann., MD Primary Cardiologist:Crenshaw Electrophysiologist: [name] LastWeight:153lbs  Bi-V Pacing:>99%  AT/AF <1%  Spoke with wife and patient was not home.   Corvue thoracic impedancesuggesting possible fluid accumulation since 05/09/19.  Prescribed:Furosemide 40 mg 1 tablet (40 mg total) daily. Potassium 20 mEq 1 tablet daily.  Labs: 11/23/2018 Creatinine1.59Anastasia Pall, Potassium4.5, [id number], MBW46-65 11/09/2018 Creatinine1.71, BUN33, Potassium4.2, [id number], [id number] A complete set of results can be found in Results Review  Recommendations: None  Follow-up plan: ICM clinic phone appointment on 05/22/2019 to recheck fluid levels.   91 day device clinic remote transmission 07/25/2019.  Office appt 05/15/2019 with Dr. Johney Frame.    Copy of ICM check sent to Dr. Johney Frame and Dr Jens Som.   3 month ICM trend: 05/10/2019    1 Year ICM trend:       Karie Soda, RN 05/12/2019 4:08 PM

## 2019-05-15 ENCOUNTER — Encounter: Payer: Self-pay | Admitting: Internal Medicine

## 2019-05-15 ENCOUNTER — Telehealth (INDEPENDENT_AMBULATORY_CARE_PROVIDER_SITE_OTHER): Payer: Medicare HMO | Admitting: Internal Medicine

## 2019-05-15 VITALS — BP 118/64 | HR 70 | Ht 67.0 in | Wt 158.0 lb

## 2019-05-15 DIAGNOSIS — I255 Ischemic cardiomyopathy: Secondary | ICD-10-CM

## 2019-05-15 DIAGNOSIS — D6869 Other thrombophilia: Secondary | ICD-10-CM

## 2019-05-15 DIAGNOSIS — I5022 Chronic systolic (congestive) heart failure: Secondary | ICD-10-CM | POA: Diagnosis not present

## 2019-05-15 DIAGNOSIS — I10 Essential (primary) hypertension: Secondary | ICD-10-CM | POA: Diagnosis not present

## 2019-05-15 DIAGNOSIS — I4819 Other persistent atrial fibrillation: Secondary | ICD-10-CM | POA: Diagnosis not present

## 2019-05-15 NOTE — Progress Notes (Signed)
Electrophysiology TeleHealth Note   Due to national recommendations of social distancing due to COVID 19, an audio/video telehealth visit is felt to be most appropriate for this patient at this time.  See MyChart message from today for the patient's consent to telehealth for [facility].   Date:  05/15/2019   ID:  Dave Daniels, DOB [birth year], MRN [id number]  Location: patient's home  Provider location:  [facility]  Evaluation Performed: Follow-up visit  PCP:  Loraine Leriche., MD   Electrophysiologist:  Dr Rayann Heman  Chief Complaint:  palpitations  History of Present Illness:    Dave Daniels is a 79 y.o. male who presents via telehealth conferencing today.  Since last being seen in our clinic, the patient reports doing very well.  Today, he denies symptoms of palpitations, chest pain, shortness of breath,  lower extremity edema, dizziness, presyncope, or syncope.  The patient is otherwise without complaint today.  The patient denies symptoms of fevers, chills, cough, or new SOB worrisome for COVID 19.  Past Medical History:  Diagnosis Date  . CAD (coronary artery disease)    MI in [place] with stenting in 2010, then MI with CABG in [facility] 06/2010.  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  Marland Kitchen Complete heart block ([place])   . Gout   . Hyperlipidemia   . Hypertension   . Ischemic cardiomyopathy    EF 15% by echo 11/2010 and still 20-25% by follow-up echo 02/2011, s/p St. Jude Bi-V ICD implantation 04/01/11  . LBBB (left bundle branch block)   . Renal insufficiency    Cr 1.6 on 03/25/11    Past Surgical History:  Procedure Laterality Date  . BI-VENTRICULAR IMPLANTABLE CARDIOVERTER DEFIBRILLATOR N/A 04/01/2011   Procedure: BI-VENTRICULAR IMPLANTABLE CARDIOVERTER DEFIBRILLATOR  (CRT-D);  Surgeon: Evans Lance, MD;  Location: [facility] CATH LAB;  Service: Cardiovascular;  Laterality: N/A;  . CARDIAC DEFIBRILLATOR PLACEMENT  12/12   BiV ICD (SJM) implanted by Dr Rayann Heman  .  CARDIOVERSION N/A 04/22/2018   Procedure: CARDIOVERSION;  Surgeon: Sueanne Margarita, MD;  Location: [facility] ENDOSCOPY;  Service: Cardiovascular;  Laterality: N/A;  . Carpel tunnel surgery    . CORONARY ARTERY BYPASS GRAFT  3/12   in [place]  . EP IMPLANTABLE DEVICE N/A 08/19/2015   Procedure: BIV ICD Generator Changeout;  Surgeon: Thompson Grayer, MD;  Location: [place] CV LAB;  Service: Cardiovascular;  Laterality: N/A;  . INGUINAL HERNIA REPAIR    . TONSILLECTOMY    . UMBILICAL HERNIA REPAIR      Current Outpatient Medications  Medication Sig Dispense Refill  . acetaminophen (TYLENOL) 500 MG tablet Take 1,000 mg by mouth daily as needed for moderate pain or headache.    . allopurinol (ZYLOPRIM) 300 MG tablet Take 300 mg by mouth daily at 2 PM.   11  . carvedilol (COREG) 12.5 MG tablet TAKE 1 TABLET BY MOUTH TWICE DAILY WITH MEALS 180 tablet 3  . colchicine 0.6 MG tablet Take 0.6 mg by mouth daily as needed (gout flare).     Marland Kitchen ELIQUIS 5 MG TABS tablet Take 1 tablet by mouth twice daily 60 tablet 5  . finasteride (PROSCAR) 5 MG tablet Take 5 mg by mouth daily.    . furosemide (LASIX) 40 MG tablet Take 1 tablet (40 mg total) by mouth daily. 180 tablet 1  . losartan (COZAAR) 25 MG tablet Take 25 mg by mouth daily.   3  . meloxicam (MOBIC) 15 MG tablet     .  meloxicam (MOBIC) 7.5 MG tablet TAKE 1 TABLET BY MOUTH ONCE DAILY. REPLACES INDOMETHACIN    . potassium chloride SA (K-DUR,KLOR-CON) 20 MEQ tablet Take 1 tablet (20 mEq total) by mouth daily. 30 tablet 11   No current facility-administered medications for this visit.    Allergies:   Statins, Nsaids, Other, and Tolmetin   Social History:  The patient  reports that he has never smoked. He has never used smokeless tobacco. He reports current alcohol use. He reports that he does not use drugs.   Family History:  The patient's family history includes Pancreatic cancer in his mother.   ROS:  Please see the history of present illness.   All other  systems are personally reviewed and negative.    Exam:    Vital Signs:  BP 118/64   Ht 5\' 7"  (1.702 m)   Wt 158 lb (71.7 kg)   BMI 24.75 kg/m   Well sounding and appearing, alert and conversant, regular work of breathing,  good skin color Eyes- anicteric, neuro- grossly intact, skin- no apparent rash or lesions or cyanosis, mouth- oral mucosa is pink  Labs/Other Tests and Data Reviewed:    Recent Labs: 11/23/2018: BUN 27; Creatinine, Ser 1.59; Hemoglobin 13.1; Platelets 236; Potassium 4.5; Sodium 143   Wt Readings from Last 3 Encounters:  05/15/19 158 lb (71.7 kg)  11/09/18 159 lb 1.3 oz (72.2 kg)  05/12/18 151 lb 9.6 oz (68.8 kg)     Last device remote is reviewed from PaceART PDF which reveals normal device function, no arrhythmias    ASSESSMENT & PLAN:    1.  Chronic systolic dysfunction/ CAD/ ischemic CM No ischemic symptoms euvolemic ICD function is normal Remotes are uptodate Followed in ICM device clinic  2. Persistent afib Doing well without AAD chads2vasc score is at least 4.  He is on eliquis  3. HTN Stable No change required today   Follow-up:  12 months with me   Patient Risk:  after full review of this patients clinical status, I feel that they are at moderate risk at this time.  Today, I have spent 15 minutes with the patient with telehealth technology discussing arrhythmia management .    05/14/18, MD  05/15/2019 10:54 AM     [facility] [address] [place] [id number] (office) [phone] (fax)

## 2019-05-18 ENCOUNTER — Telehealth: Payer: Self-pay

## 2019-05-18 NOTE — Telephone Encounter (Signed)
Called and spoke with pt.  He requested appt middle of next wk d/t his schedule.  Pt is scheduled 05/24/19 with Rudi Coco, NP, pt aware

## 2019-05-18 NOTE — Telephone Encounter (Signed)
ICD alert received- Pt continues in Persistent AF since 05/07/19.  Known AF on OAC.  Pt had VV with Dr. Johney Frame yesterday, no changes made.  Current medications include Carvedilol 12.5mg  BID.  Reviewed with ASeiler, NP she recommends contact pt to disucss symptoms and offer AFIB clinic due to ongoing AF episode.    Spoke with pt, he is asymptomatic and has been throughout current episode.  He confirms he is taking his medications as ordered.    Pt agreeable to AFIB clinic

## 2019-05-22 ENCOUNTER — Ambulatory Visit (INDEPENDENT_AMBULATORY_CARE_PROVIDER_SITE_OTHER): Payer: Medicare HMO

## 2019-05-22 DIAGNOSIS — Z95810 Presence of automatic (implantable) cardiac defibrillator: Secondary | ICD-10-CM

## 2019-05-22 DIAGNOSIS — I5022 Chronic systolic (congestive) heart failure: Secondary | ICD-10-CM

## 2019-05-23 ENCOUNTER — Other Ambulatory Visit: Payer: Self-pay

## 2019-05-23 ENCOUNTER — Ambulatory Visit (HOSPITAL_COMMUNITY)
Admission: RE | Admit: 2019-05-23 | Discharge: 2019-05-23 | Disposition: A | Discharge status: Home / Self Care | Payer: Medicare HMO | Source: Ambulatory Visit | Attending: Nurse Practitioner | Admitting: Nurse Practitioner

## 2019-05-23 ENCOUNTER — Encounter (HOSPITAL_COMMUNITY): Payer: Self-pay | Admitting: Nurse Practitioner

## 2019-05-23 VITALS — BP 126/66 | HR 70 | Ht 67.0 in | Wt 164.8 lb

## 2019-05-23 DIAGNOSIS — Z95810 Presence of automatic (implantable) cardiac defibrillator: Secondary | ICD-10-CM | POA: Insufficient documentation

## 2019-05-23 DIAGNOSIS — E785 Hyperlipidemia, unspecified: Secondary | ICD-10-CM | POA: Diagnosis not present

## 2019-05-23 DIAGNOSIS — N289 Disorder of kidney and ureter, unspecified: Secondary | ICD-10-CM | POA: Diagnosis not present

## 2019-05-23 DIAGNOSIS — D6869 Other thrombophilia: Secondary | ICD-10-CM | POA: Diagnosis not present

## 2019-05-23 DIAGNOSIS — Z886 Allergy status to analgesic agent status: Secondary | ICD-10-CM | POA: Insufficient documentation

## 2019-05-23 DIAGNOSIS — Z800 Family history of malignant neoplasm of digestive organs: Secondary | ICD-10-CM | POA: Insufficient documentation

## 2019-05-23 DIAGNOSIS — Z951 Presence of aortocoronary bypass graft: Secondary | ICD-10-CM | POA: Insufficient documentation

## 2019-05-23 DIAGNOSIS — Z888 Allergy status to other drugs, medicaments and biological substances status: Secondary | ICD-10-CM | POA: Diagnosis not present

## 2019-05-23 DIAGNOSIS — I252 Old myocardial infarction: Secondary | ICD-10-CM | POA: Insufficient documentation

## 2019-05-23 DIAGNOSIS — Z79899 Other long term (current) drug therapy: Secondary | ICD-10-CM | POA: Diagnosis not present

## 2019-05-23 DIAGNOSIS — I2510 Atherosclerotic heart disease of native coronary artery without angina pectoris: Secondary | ICD-10-CM | POA: Diagnosis not present

## 2019-05-23 DIAGNOSIS — I442 Atrioventricular block, complete: Secondary | ICD-10-CM | POA: Insufficient documentation

## 2019-05-23 DIAGNOSIS — I10 Essential (primary) hypertension: Secondary | ICD-10-CM | POA: Insufficient documentation

## 2019-05-23 DIAGNOSIS — Z7901 Long term (current) use of anticoagulants: Secondary | ICD-10-CM | POA: Insufficient documentation

## 2019-05-23 DIAGNOSIS — I4891 Unspecified atrial fibrillation: Secondary | ICD-10-CM | POA: Diagnosis present

## 2019-05-23 DIAGNOSIS — M109 Gout, unspecified: Secondary | ICD-10-CM | POA: Insufficient documentation

## 2019-05-23 DIAGNOSIS — I4819 Other persistent atrial fibrillation: Secondary | ICD-10-CM | POA: Diagnosis not present

## 2019-05-23 LAB — BASIC METABOLIC PANEL
Anion gap: 11 (ref 5–15)
BUN: 31 mg/dL — ABNORMAL HIGH (ref 8–23)
CO2: 25 mmol/L (ref 22–32)
Calcium: 9.6 mg/dL (ref 8.9–10.3)
Chloride: 103 mmol/L (ref 98–111)
Creatinine, Ser: 1.88 mg/dL — ABNORMAL HIGH (ref 0.61–1.24)
GFR calc Af Amer: 39 mL/min — ABNORMAL LOW (ref >60)
GFR calc non Af Amer: 33 mL/min — ABNORMAL LOW (ref >60)
Glucose, Bld: 98 mg/dL (ref 70–99)
Potassium: 4.7 mmol/L (ref 3.5–5.1)
Sodium: 139 mmol/L (ref 135–145)

## 2019-05-23 LAB — CBC
HCT: 43.8 % (ref 39.0–52.0)
Hemoglobin: 13.7 g/dL (ref 13.0–17.0)
MCH: 28.8 pg (ref 26.0–34.0)
MCHC: 31.3 g/dL (ref 30.0–36.0)
MCV: 92.2 fL (ref 80.0–100.0)
Platelets: 216 10*3/uL (ref 150–400)
RBC: 4.75 MIL/uL (ref 4.22–5.81)
RDW: 14.2 % (ref 11.5–15.5)
WBC: 7.5 10*3/uL (ref 4.0–10.5)
nRBC: 0 % (ref 0.0–0.2)

## 2019-05-23 LAB — TSH: TSH: 1.809 u[IU]/mL (ref 0.350–4.500)

## 2019-05-23 NOTE — Patient Instructions (Signed)
Cardioversion scheduled for Thursday. February 18th  - Arrive at the [facility] and go to admitting at [facility] not eat or drink anything after midnight the night prior to your procedure.  - Take all your morning medication with a sip of water prior to arrival.  - You will not be able to drive home after your procedure.

## 2019-05-23 NOTE — Progress Notes (Signed)
 Primary Care Physician: Velazquez, Gretchen Y., MD Referring Physician:Dr. [name]   Dave Daniels is a 79 y.o. male with a h/o CAD, CKD, CHB with ICD for h/o ischemic cardiomyopathy,HTN, that  is in the clinic for device clinic finding persistent afib since 05/07/19. He has not missed any of his eliquis 5 mg bid for a CHA2DS2VASc score of 5.   He is unaware of any irregular heart rhythm, denies fatigue/ shortness of breath.  The only trigger is that he had a spell of gout mid January.  Today, he denies symptoms of palpitations, chest pain, shortness of breath, orthopnea, PND, lower extremity edema, dizziness, presyncope, syncope, or neurologic sequela. The patient is tolerating medications without difficulties and is otherwise without complaint today.   Past Medical History:  Diagnosis Date  . CAD (coronary artery disease)    MI in [place] with stenting in 2010, then MI with CABG in [place] 06/2010.  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  . Complete heart block ([facility])   . Gout   . Hyperlipidemia   . Hypertension   . Ischemic cardiomyopathy    EF 15% by echo 11/2010 and still 20-25% by follow-up echo 02/2011, s/p St. Jude Bi-V ICD implantation 04/01/11  . LBBB (left bundle branch block)   . Renal insufficiency    Cr 1.6 on 03/25/11   Past Surgical History:  Procedure Laterality Date  . BI-VENTRICULAR IMPLANTABLE CARDIOVERTER DEFIBRILLATOR N/A 04/01/2011   Procedure: BI-VENTRICULAR IMPLANTABLE CARDIOVERTER DEFIBRILLATOR  (CRT-D);  Surgeon: Gregg W Taylor, MD;  Location: [facility] CATH LAB;  Service: Cardiovascular;  Laterality: N/A;  . CARDIAC DEFIBRILLATOR PLACEMENT  12/12   BiV ICD (SJM) implanted by Dr [name]  . CARDIOVERSION N/A 04/22/2018   Procedure: CARDIOVERSION;  Surgeon: Turner, Traci R, MD;  Location: [facility] ENDOSCOPY;  Service: Cardiovascular;  Laterality: N/A;  . Carpel tunnel surgery    . CORONARY ARTERY BYPASS GRAFT  3/12   in [place]  . EP IMPLANTABLE DEVICE N/A 08/19/2015   Procedure: BIV  ICD Generator Changeout;  Surgeon: James [name], MD;  Location: [facility] INVASIVE CV LAB;  Service: Cardiovascular;  Laterality: N/A;  . INGUINAL HERNIA REPAIR    . TONSILLECTOMY    . UMBILICAL HERNIA REPAIR      Current Outpatient Medications  Medication Sig Dispense Refill  . acetaminophen (TYLENOL) 500 MG tablet Take 1,000 mg by mouth daily as needed for moderate pain or headache.    . allopurinol (ZYLOPRIM) 300 MG tablet Take 300 mg by mouth daily at 2 PM.   11  . carvedilol (COREG) 12.5 MG tablet TAKE 1 TABLET BY MOUTH TWICE DAILY WITH MEALS 180 tablet 3  . colchicine 0.6 MG tablet Take 0.6 mg by mouth daily as needed (gout flare).     . ELIQUIS 5 MG TABS tablet Take 1 tablet by mouth twice daily 60 tablet 5  . finasteride (PROSCAR) 5 MG tablet Take 5 mg by mouth daily.    . furosemide (LASIX) 40 MG tablet Take 1 tablet (40 mg total) by mouth daily. 180 tablet 1  . losartan (COZAAR) 25 MG tablet Take 25 mg by mouth daily.   3  . meloxicam (MOBIC) 15 MG tablet     . potassium chloride SA (K-DUR,KLOR-CON) 20 MEQ tablet Take 1 tablet (20 mEq total) by mouth daily. 30 tablet 11   No current facility-administered medications for this encounter.    Allergies  Allergen Reactions  . Statins Hives    Muscle pain & severe hives  .   Nsaids Other (See Comments)    Renal insufficiency   . Other     Statins-reductase inhibitor  . Tolmetin Other (See Comments)    Renal insufficiency    Social History   Socioeconomic History  . Marital status: Married    Spouse name: Not on file  . Number of children: 4  . Years of education: Not on file  . Highest education level: Not on file  Occupational History    Comment: Retired  Tobacco Use  . Smoking status: Never Smoker  . Smokeless tobacco: Never Used  Substance and Sexual Activity  . Alcohol use: Yes    Alcohol/week: 2.0 standard drinks    Types: 2 Glasses of wine per week    Comment: occasional  . Drug use: No  . Sexual activity: Yes    Other Topics Concern  . 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   Social Determinants of Corporate investment banker Strain:   . Difficulty of Paying Living Expenses: Not on file  Food Insecurity:   . Worried About Programme researcher, broadcasting/film/video in the Last Year: Not on file  . Ran Out of Food in the Last Year: Not on file  Transportation Needs:   . Lack of Transportation (Medical): Not on file  . Lack of Transportation (Non-Medical): Not on file  Physical Activity:   . Days of Exercise per Week: Not on file  . Minutes of Exercise per Session: Not on file  Stress:   . Feeling of Stress : Not on file  Social Connections:   . Frequency of Communication with Friends and Family: Not on file  . Frequency of Social Gatherings with Friends and Family: Not on file  . Attends Religious Services: Not on file  . Active Member of Clubs or Organizations: Not on file  . Attends Banker Meetings: Not on file  . Marital Status: Not on file  Intimate Partner Violence:   . Fear of Current or Ex-Partner: Not on file  . Emotionally Abused: Not on file  . Physically Abused: Not on file  . Sexually Abused: Not on file    Family History  Problem Relation Age of Onset  . Pancreatic cancer Mother     ROS- All systems are reviewed and negative except as per the HPI above  Physical Exam: Vitals:   05/23/19 1329  BP: 126/66  Pulse: 70  Weight: 74.8 kg  Height: 5\' 7"  (1.702 m)   Wt Readings from Last 3 Encounters:  05/23/19 74.8 kg  05/15/19 71.7 kg  11/09/18 72.2 kg    Labs: Lab Results  Component Value Date   NA 143 11/23/2018   K 4.5 11/23/2018   CL 105 11/23/2018   CO2 23 11/23/2018   GLUCOSE 110 (H) 11/23/2018   BUN 27 11/23/2018   CREATININE 1.59 (H) 11/23/2018   CALCIUM 10.1 11/23/2018   Lab Results  Component Value Date   INR 1.0 03/25/2011   Lab Results  Component Value Date   CHOL 153  07/13/2012   HDL 41 07/13/2012   LDLCALC 92 07/13/2012   TRIG 102 07/13/2012     GEN- The patient is well appearing, alert and oriented x 3 today.   Head- normocephalic, atraumatic Eyes-  Sclera clear, conjunctiva pink Ears- hearing intact Oropharynx- clear Neck- supple, no JVP Lymph- no cervical lymphadenopathy Lungs- Clear to ausculation bilaterally, normal work of  breathing Heart- Regular rate and rhythm, no murmurs, rubs or gallops, PMI not laterally displaced GI- soft, NT, ND, + BS Extremities- no clubbing, cyanosis, or edema MS- no significant deformity or atrophy Skin- no rash or lesion Psych- euthymic mood, full affect Neuro- strength and sensation are intact  EKG-v paced rhythm at 70 bpm Paceart report reviewed from 05/18/19 showing persistent aAF since 05/07/19    Assessment and Plan: 1. Persistent  afib since 05/07/19 He is asymptomatic Continue carvedilol 12.5 mg bid Will set up for cardioversion  Risk vrs benefit discussed  Cbc/bmet/tsh today   2.CHA2DS2VASc score of at least 5 Doing well on eliquis 5 mg bid States no missed doses x 3 weeks   F/u with afib clinic one week after cardioversion  Butch Penny C. Detric Scalisi, [facility] [address] [place], [place] [address] [phone]

## 2019-05-23 NOTE — H&P (View-Only) (Signed)
Primary Care Physician: Cheron Schaumann., MD Referring Physician:Dr. [name] is a 79 y.o. male with a h/o CAD, CKD, CHB with ICD for h/o ischemic cardiomyopathy,HTN, that  is in the clinic for device clinic finding persistent afib since 05/07/19. He has not missed any of his eliquis 5 mg bid for a CHA2DS2VASc score of 5.   He is unaware of any irregular heart rhythm, denies fatigue/ shortness of breath.  The only trigger is that he had a spell of gout mid January.  Today, he denies symptoms of palpitations, chest pain, shortness of breath, orthopnea, PND, lower extremity edema, dizziness, presyncope, syncope, or neurologic sequela. The patient is tolerating medications without difficulties and is otherwise without complaint today.   Past Medical History:  Diagnosis Date  . CAD (coronary artery disease)    MI in [place] with stenting in 2010, then MI with CABG in [facility] 06/2010.  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  Marland Kitchen Complete heart block (HCC)   . Gout   . Hyperlipidemia   . Hypertension   . Ischemic cardiomyopathy    EF 15% by echo 11/2010 and still 20-25% by follow-up echo 02/2011, s/p St. Jude Bi-V ICD implantation 04/01/11  . LBBB (left bundle branch block)   . Renal insufficiency    Cr 1.6 on 03/25/11   Past Surgical History:  Procedure Laterality Date  . BI-VENTRICULAR IMPLANTABLE CARDIOVERTER DEFIBRILLATOR N/A 04/01/2011   Procedure: BI-VENTRICULAR IMPLANTABLE CARDIOVERTER DEFIBRILLATOR  (CRT-D);  Surgeon: Marinus Maw, MD;  [facility] C[facility] LAB;  Service: Cardiovascular;  Laterality: N/A;  . CARDIAC DEFIBRILLATOR PLACEMENT  12/12   BiV ICD (SJM) implanted by Dr Johney Frame  . CARDIOVERSION N/A 04/22/2018   Procedure: CARDIOVERSION;  Surgeon: Quintella Reichert, MD;  Location: [facility] ENDOSCOPY;  Service: Cardiovascular;  Laterality: N/A;  . Carpel tunnel surgery    . CORONARY ARTERY BYPASS GRAFT  3/12   in [place]  . EP IMPLANTABLE DEVICE N/A 08/19/2015   Procedure: BIV  ICD Generator Changeout;  Surgeon: Hillis Range, MD;  Location: [facility] INVASIVE CV LAB;  Service: Cardiovascular;  Laterality: N/A;  . INGUINAL HERNIA REPAIR    . TONSILLECTOMY    . UMBILICAL HERNIA REPAIR      Current Outpatient Medications  Medication Sig Dispense Refill  . acetaminophen (TYLENOL) 500 MG tablet Take 1,000 mg by mouth daily as needed for moderate pain or headache.    . allopurinol (ZYLOPRIM) 300 MG tablet Take 300 mg by mouth daily at 2 PM.   11  . carvedilol (COREG) 12.5 MG tablet TAKE 1 TABLET BY MOUTH TWICE DAILY WITH MEALS 180 tablet 3  . colchicine 0.6 MG tablet Take 0.6 mg by mouth daily as needed (gout flare).     Marland Kitchen ELIQUIS 5 MG TABS tablet Take 1 tablet by mouth twice daily 60 tablet 5  . finasteride (PROSCAR) 5 MG tablet Take 5 mg by mouth daily.    . furosemide (LASIX) 40 MG tablet Take 1 tablet (40 mg total) by mouth daily. 180 tablet 1  . losartan (COZAAR) 25 MG tablet Take 25 mg by mouth daily.   3  . meloxicam (MOBIC) 15 MG tablet     . potassium chloride SA (K-DUR,KLOR-CON) 20 MEQ tablet Take 1 tablet (20 mEq total) by mouth daily. 30 tablet 11   No current facility-administered medications for this encounter.    Allergies  Allergen Reactions  . Statins Hives    Muscle pain & severe hives  .  Nsaids Other (See Comments)    Renal insufficiency   . Other     Statins-reductase inhibitor  . Tolmetin Other (See Comments)    Renal insufficiency    Social History   Socioeconomic History  . Marital status: Married    Spouse name: Not on file  . Number of children: 4  . Years of education: Not on file  . Highest education level: Not on file  Occupational History    Comment: Retired  Tobacco Use  . Smoking status: Never Smoker  . Smokeless tobacco: Never Used  Substance and Sexual Activity  . Alcohol use: Yes    Alcohol/week: 2.0 standard drinks    Types: 2 Glasses of wine per week    Comment: occasional  . Drug use: No  . Sexual activity: Yes    Other Topics Concern  . 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   Social Determinants of Corporate investment banker Strain:   . Difficulty of Paying Living Expenses: Not on file  Food Insecurity:   . Worried About Programme researcher, broadcasting/film/video in the Last Year: Not on file  . Ran Out of Food in the Last Year: Not on file  Transportation Needs:   . Lack of Transportation (Medical): Not on file  . Lack of Transportation (Non-Medical): Not on file  Physical Activity:   . Days of Exercise per Week: Not on file  . Minutes of Exercise per Session: Not on file  Stress:   . Feeling of Stress : Not on file  Social Connections:   . Frequency of Communication with Friends and Family: Not on file  . Frequency of Social Gatherings with Friends and Family: Not on file  . Attends Religious Services: Not on file  . Active Member of Clubs or Organizations: Not on file  . Attends Banker Meetings: Not on file  . Marital Status: Not on file  Intimate Partner Violence:   . Fear of Current or Ex-Partner: Not on file  . Emotionally Abused: Not on file  . Physically Abused: Not on file  . Sexually Abused: Not on file    Family History  Problem Relation Age of Onset  . Pancreatic cancer Mother     ROS- All systems are reviewed and negative except as per the HPI above  Physical Exam: Vitals:   05/23/19 1329  BP: 126/66  Pulse: 70  Weight: 74.8 kg  Height: 5\' 7"  (1.702 m)   Wt Readings from Last 3 Encounters:  05/23/19 74.8 kg  05/15/19 71.7 kg  11/09/18 72.2 kg    Labs: Lab Results  Component Value Date   NA 143 11/23/2018   K 4.5 11/23/2018   CL 105 11/23/2018   CO2 23 11/23/2018   GLUCOSE 110 (H) 11/23/2018   BUN 27 11/23/2018   CREATININE 1.59 (H) 11/23/2018   CALCIUM 10.1 11/23/2018   Lab Results  Component Value Date   INR 1.0 03/25/2011   Lab Results  Component Value Date   CHOL 153  07/13/2012   HDL 41 07/13/2012   LDLCALC 92 07/13/2012   TRIG 102 07/13/2012     GEN- The patient is well appearing, alert and oriented x 3 today.   Head- normocephalic, atraumatic Eyes-  Sclera clear, conjunctiva pink Ears- hearing intact Oropharynx- clear Neck- supple, no JVP Lymph- no cervical lymphadenopathy Lungs- Clear to ausculation bilaterally, normal work of  breathing Heart- Regular rate and rhythm, no murmurs, rubs or gallops, PMI not laterally displaced GI- soft, NT, ND, + BS Extremities- no clubbing, cyanosis, or edema MS- no significant deformity or atrophy Skin- no rash or lesion Psych- euthymic mood, full affect Neuro- strength and sensation are intact  EKG-v paced rhythm at 70 bpm Paceart report reviewed from 05/18/19 showing persistent aAF since 05/07/19    Assessment and Plan: 1. Persistent  afib since 05/07/19 He is asymptomatic Continue carvedilol 12.5 mg bid Will set up for cardioversion  Risk vrs benefit discussed  Cbc/bmet/tsh today   2.CHA2DS2VASc score of at least 5 Doing well on eliquis 5 mg bid States no missed doses x 3 weeks   F/u with afib clinic one week after cardioversion  Dave Daniels, [facility] [address] [place], [place] [address] [phone]

## 2019-05-24 ENCOUNTER — Telehealth: Payer: Self-pay

## 2019-05-24 ENCOUNTER — Ambulatory Visit (HOSPITAL_COMMUNITY): Payer: Medicare HMO | Admitting: Nurse Practitioner

## 2019-05-24 NOTE — Addendum Note (Signed)
Addended by: Karie Soda on: 05/24/2019 05:06 PM   Modules accepted: Level of Service

## 2019-05-24 NOTE — Telephone Encounter (Signed)
Remote ICM transmission received.  Attempted call to patient regarding ICM remote transmission and left detailed message per DPR.  Advised to return call for any fluid symptoms or questions. Next ICM remote transmission scheduled 06/26/2019.     

## 2019-05-24 NOTE — Progress Notes (Signed)
EPIC Encounter for ICM Monitoring  Patient Name: Dave Daniels is a 79 y.o. male Date: 05/24/2019 Primary Care Physican: Cheron Schaumann., MD Primary Cardiologist:Crenshaw Electrophysiologist: [name] LastWeight:153lbs  Bi-V Pacing:>99%  AT/AF 10%  Attempted call to patient and unable to reach.  Left detailed message per DPR regarding transmission. Transmission reviewed.   Corvue thoracic impedancesuggesting possible fluid accumulation since 05/09/19.  Contacted by device clinic RN regarding AT/AF  Prescribed:Furosemide 40 mg 1 tablet (40 mg total) daily. Potassium 20 mEq 1 tablet daily.  Labs: 11/23/2018 Creatinine1.59Anastasia Pall, Potassium4.5, [id number], [id number] 11/09/2018 Creatinine1.71, BUN33, Potassium4.2, [id number], [id number] A complete set of results can be found in Results Review  Recommendations:  Left voice mail with ICM number and encouraged to call if experiencing any fluid symptoms.  Follow-up plan: ICM clinic phone appointment on 06/26/2019.   91 day device clinic remote transmission 07/25/2019.   Copy of ICM check sent to Dr. Johney Frame  3 month ICM trend: 05/24/2019    1 Year ICM trend:       Karie Soda, RN 05/24/2019 4:59 PM

## 2019-05-29 ENCOUNTER — Other Ambulatory Visit (HOSPITAL_COMMUNITY)
Admission: RE | Admit: 2019-05-29 | Discharge: 2019-05-29 | Disposition: A | Discharge status: Home / Self Care | Payer: Medicare HMO | Source: Ambulatory Visit | Attending: Cardiology | Admitting: Cardiology

## 2019-05-29 DIAGNOSIS — Z20822 Contact with and (suspected) exposure to covid-19: Secondary | ICD-10-CM | POA: Insufficient documentation

## 2019-05-29 DIAGNOSIS — Z01812 Encounter for preprocedural laboratory examination: Secondary | ICD-10-CM | POA: Insufficient documentation

## 2019-05-29 LAB — SARS CORONAVIRUS 2 (TAT 6-24 HRS): SARS Coronavirus 2: NEGATIVE

## 2019-06-01 ENCOUNTER — Encounter (HOSPITAL_COMMUNITY): Payer: Self-pay | Admitting: Cardiology

## 2019-06-01 ENCOUNTER — Ambulatory Visit (HOSPITAL_COMMUNITY): Admission: RE | Admit: 2019-06-01 | Payer: Medicare HMO | Source: Home / Self Care | Admitting: Cardiology

## 2019-06-01 ENCOUNTER — Encounter (HOSPITAL_COMMUNITY): Admission: RE | Payer: Self-pay | Source: Home / Self Care

## 2019-06-01 ENCOUNTER — Encounter (HOSPITAL_COMMUNITY): Payer: Self-pay | Admitting: Certified Registered Nurse Anesthetist

## 2019-06-01 SURGERY — CARDIOVERSION
Anesthesia: General

## 2019-06-02 ENCOUNTER — Other Ambulatory Visit (HOSPITAL_COMMUNITY): Payer: Self-pay | Admitting: *Deleted

## 2019-06-02 ENCOUNTER — Encounter (HOSPITAL_COMMUNITY): Payer: Self-pay

## 2019-06-08 ENCOUNTER — Ambulatory Visit (HOSPITAL_COMMUNITY): Payer: Medicare HMO | Admitting: Nurse Practitioner

## 2019-06-09 ENCOUNTER — Other Ambulatory Visit (HOSPITAL_COMMUNITY)
Admission: RE | Admit: 2019-06-09 | Discharge: 2019-06-09 | Disposition: A | Discharge status: Home / Self Care | Payer: Medicare HMO | Source: Ambulatory Visit | Attending: Cardiology | Admitting: Cardiology

## 2019-06-09 DIAGNOSIS — Z20822 Contact with and (suspected) exposure to covid-19: Secondary | ICD-10-CM | POA: Insufficient documentation

## 2019-06-09 DIAGNOSIS — Z01812 Encounter for preprocedural laboratory examination: Secondary | ICD-10-CM | POA: Insufficient documentation

## 2019-06-09 LAB — SARS CORONAVIRUS 2 (TAT 6-24 HRS): SARS Coronavirus 2: NEGATIVE

## 2019-06-12 NOTE — Progress Notes (Signed)
St. Jude rep notified of patient scheduled for cardioversion 06/13/2019.

## 2019-06-13 ENCOUNTER — Ambulatory Visit (HOSPITAL_COMMUNITY): Payer: Medicare HMO | Admitting: Certified Registered Nurse Anesthetist

## 2019-06-13 ENCOUNTER — Encounter (HOSPITAL_COMMUNITY): Payer: Self-pay | Admitting: Cardiology

## 2019-06-13 ENCOUNTER — Encounter (HOSPITAL_COMMUNITY)
Admission: RE | Disposition: A | Discharge status: Home / Self Care | Payer: Self-pay | Source: Home / Self Care | Attending: Cardiology

## 2019-06-13 ENCOUNTER — Other Ambulatory Visit: Payer: Self-pay

## 2019-06-13 ENCOUNTER — Ambulatory Visit (HOSPITAL_COMMUNITY)
Admission: RE | Admit: 2019-06-13 | Discharge: 2019-06-13 | Disposition: A | Discharge status: Home / Self Care | Payer: Medicare HMO | Attending: Cardiology | Admitting: Cardiology

## 2019-06-13 DIAGNOSIS — Z79899 Other long term (current) drug therapy: Secondary | ICD-10-CM | POA: Diagnosis not present

## 2019-06-13 DIAGNOSIS — Z955 Presence of coronary angioplasty implant and graft: Secondary | ICD-10-CM | POA: Insufficient documentation

## 2019-06-13 DIAGNOSIS — Z7901 Long term (current) use of anticoagulants: Secondary | ICD-10-CM | POA: Insufficient documentation

## 2019-06-13 DIAGNOSIS — Z95810 Presence of automatic (implantable) cardiac defibrillator: Secondary | ICD-10-CM | POA: Diagnosis not present

## 2019-06-13 DIAGNOSIS — M109 Gout, unspecified: Secondary | ICD-10-CM | POA: Insufficient documentation

## 2019-06-13 DIAGNOSIS — I2510 Atherosclerotic heart disease of native coronary artery without angina pectoris: Secondary | ICD-10-CM | POA: Diagnosis not present

## 2019-06-13 DIAGNOSIS — Z888 Allergy status to other drugs, medicaments and biological substances status: Secondary | ICD-10-CM | POA: Diagnosis not present

## 2019-06-13 DIAGNOSIS — Z951 Presence of aortocoronary bypass graft: Secondary | ICD-10-CM | POA: Insufficient documentation

## 2019-06-13 DIAGNOSIS — I10 Essential (primary) hypertension: Secondary | ICD-10-CM | POA: Insufficient documentation

## 2019-06-13 DIAGNOSIS — I255 Ischemic cardiomyopathy: Secondary | ICD-10-CM | POA: Diagnosis not present

## 2019-06-13 DIAGNOSIS — E785 Hyperlipidemia, unspecified: Secondary | ICD-10-CM | POA: Insufficient documentation

## 2019-06-13 DIAGNOSIS — I252 Old myocardial infarction: Secondary | ICD-10-CM | POA: Insufficient documentation

## 2019-06-13 DIAGNOSIS — I4819 Other persistent atrial fibrillation: Secondary | ICD-10-CM | POA: Diagnosis present

## 2019-06-13 DIAGNOSIS — Z886 Allergy status to analgesic agent status: Secondary | ICD-10-CM | POA: Insufficient documentation

## 2019-06-13 HISTORY — PX: CARDIOVERSION: SHX1299

## 2019-06-13 LAB — POCT I-STAT, CHEM 8
BUN: 45 mg/dL — ABNORMAL HIGH (ref 8–23)
Calcium, Ion: 1.15 mmol/L (ref 1.15–1.40)
Chloride: 104 mmol/L (ref 98–111)
Creatinine, Ser: 1.9 mg/dL — ABNORMAL HIGH (ref 0.61–1.24)
Glucose, Bld: 86 mg/dL (ref 70–99)
HCT: 46 % (ref 39.0–52.0)
Hemoglobin: 15.6 g/dL (ref 13.0–17.0)
Potassium: 4.6 mmol/L (ref 3.5–5.1)
Sodium: 142 mmol/L (ref 135–145)
TCO2: 32 mmol/L (ref 22–32)

## 2019-06-13 SURGERY — CARDIOVERSION
Anesthesia: General

## 2019-06-13 MED ORDER — SODIUM CHLORIDE 0.9 % IV SOLN
INTRAVENOUS | Status: AC | PRN
  Administered 2019-06-13: 500 mL via INTRAMUSCULAR

## 2019-06-13 MED ORDER — LIDOCAINE 2% (20 MG/ML) 5 ML SYRINGE
INTRAMUSCULAR | Status: DC | PRN
  Administered 2019-06-13: 50 mg via INTRAVENOUS

## 2019-06-13 MED ORDER — ETOMIDATE 2 MG/ML IV SOLN
INTRAVENOUS | Status: DC | PRN
  Administered 2019-06-13: 14 mg via INTRAVENOUS

## 2019-06-13 NOTE — CV Procedure (Signed)
   Electrical Cardioversion Procedure Note Dave Daniels [id number] 23-Oct-1940  Procedure: Electrical Cardioversion Indications:  Atrial Fibrillation  Time Out: Verified patient identification, verified procedure,medications/allergies/relevent history reviewed, required imaging and test results available.  Performed  Procedure Details  The patient was NPO after midnight. Anesthesia was administered at the beside  by Dr.Foster with 14mg  of Etomidate and 50mg  Lidocaine.  Cardioversion was done with synchronized biphasic defibrillation with AP pads with 150watts.  The patient converted to AV paced rhythm. The patient tolerated the procedure well   IMPRESSION:  Successful cardioversion of atrial fibrillation    Dave Daniels 06/13/2019, 9:42 AM

## 2019-06-13 NOTE — Transfer of Care (Signed)
Immediate Anesthesia Transfer of Care Note  Patient: Dave Daniels  Procedure(s) Performed: CARDIOVERSION (N/A )  Patient Location: Endoscopy Unit  Anesthesia Type:General  Level of Consciousness: drowsy  Airway & Oxygen Therapy: Patient Spontanous Breathing  Post-op Assessment: Report given to RN and Post -op Vital signs reviewed and stable  Post vital signs: Reviewed  Last Vitals:  Vitals Value Taken Time  BP 136/71 06/13/19 1017  Temp    Pulse 59 06/13/19 1018  Resp 14 06/13/19 1018  SpO2 100 % 06/13/19 1018  Vitals shown include unvalidated device data.  Last Pain:  Vitals:   06/13/19 1017  TempSrc:   PainSc: Asleep         Complications: No apparent anesthesia complications

## 2019-06-13 NOTE — Interval H&P Note (Signed)
History and Physical Interval Note:  06/13/2019 9:42 AM  Dave Daniels  has presented today for surgery, with the diagnosis of AFIB.  The various methods of treatment have been discussed with the patient and family. After consideration of risks, benefits and other options for treatment, the patient has consented to  Procedure(s): CARDIOVERSION (N/A) as a surgical intervention.  The patient's history has been reviewed, patient examined, no change in status, stable for surgery.  I have reviewed the patient's chart and labs.  Questions were answered to the patient's satisfaction.     Armanda Magic

## 2019-06-13 NOTE — Anesthesia Preprocedure Evaluation (Addendum)
Anesthesia Evaluation  Patient identified by MRN, date of birth, ID band Patient awake    Reviewed: Allergy & Precautions, NPO status , Patient's Chart, lab work & pertinent test results, reviewed documented beta blocker date and time   Airway Mallampati: I  TM Distance: >3 FB Neck ROM: Full    Dental no notable dental hx. (+) Teeth Intact   Pulmonary shortness of breath and with exertion,    Pulmonary exam normal breath sounds clear to auscultation       Cardiovascular hypertension, Pt. on medications and Pt. on home beta blockers + CAD, + Past MI and + Cardiac Stents  Normal cardiovascular exam+ dysrhythmias Atrial Fibrillation + pacemaker + Cardiac Defibrillator  Rhythm:Regular Rate:Normal  LVEF 25-30 %, ischemic CM MI 2010- stent, MI 06/2010 CABG - [place] [facility] 03/16/2016 Left ventricle: The cavity size was normal. Wall thickness was increased in a pattern of moderate LVH. Systolic function wasseverely reduced. The estimated ejection fraction was in the range of 25% to 30%. There is akinesis of the anteroseptal and anterior myocardium. Doppler parameters are consistent with abnormal left ventricular relaxation (grade 1 diastolic dysfunction). Doppler parameters are consistent with high ventricular filling pressure.  - Aortic valve: There was mild regurgitation directed eccentrically in the LVOT and towards the mitral anterior leaflet.  - Left atrium: The atrium was mildly dilated.   EKG- A. Fib, LBBB   Neuro/Psych negative neurological ROS  negative psych ROS   GI/Hepatic negative GI ROS, Neg liver ROS,   Endo/Other  Hyperlipidemia  Renal/GU Renal InsufficiencyRenal disease   BPH Elevated PSA    Musculoskeletal  (+) Arthritis , Gout   Abdominal   Peds  Hematology Eliquis therapy- last dose this am   Anesthesia Other Findings   Reproductive/Obstetrics                          Anesthesia  Physical Anesthesia Plan  ASA: IV  Anesthesia Plan: General   Post-op Pain Management:    Induction: Intravenous  PONV Risk Score and Plan: 3 and Ondansetron and Treatment may vary due to age or medical condition  Airway Management Planned: Natural Airway and Mask  Additional Equipment:   Intra-op Plan:   Post-operative Plan:   Informed Consent: I have reviewed the patients History and Physical, chart, labs and discussed the procedure including the risks, benefits and alternatives for the proposed anesthesia with the patient or authorized representative who has indicated his/her understanding and acceptance.     Dental advisory given  Plan Discussed with: CRNA and Anesthesiologist  Anesthesia Plan Comments:         Anesthesia Quick Evaluation

## 2019-06-13 NOTE — Anesthesia Procedure Notes (Signed)
Procedure Name: General with mask airway Date/Time: 06/13/2019 10:07 AM Performed by: Audie Pinto, CRNA Pre-anesthesia Checklist: Patient identified, Emergency Drugs available and Suction available Oxygen Delivery Method: Ambu bag Dental Injury: Teeth and Oropharynx as per pre-operative assessment

## 2019-06-13 NOTE — Anesthesia Postprocedure Evaluation (Signed)
Anesthesia Post Note  Patient: Dave Daniels Service  Procedure(s) Performed: CARDIOVERSION (N/A )     Patient location during evaluation: PACU Anesthesia Type: General Level of consciousness: awake and alert and oriented Pain management: pain level controlled Vital Signs Assessment: post-procedure vital signs reviewed and stable Respiratory status: spontaneous breathing, nonlabored ventilation and respiratory function stable Cardiovascular status: blood pressure returned to baseline and stable Postop Assessment: no apparent nausea or vomiting Anesthetic complications: no    Last Vitals:  Vitals:   06/13/19 1027 06/13/19 1037  BP: (!) 141/72 (!) 142/73  Pulse: 60 60  Resp: 13 12  Temp:    SpO2: 100% 100%    Last Pain:  Vitals:   06/13/19 1037  TempSrc:   PainSc: 0-No pain                 Milina Pagett A.

## 2019-06-20 ENCOUNTER — Other Ambulatory Visit: Payer: Self-pay

## 2019-06-20 ENCOUNTER — Ambulatory Visit (HOSPITAL_COMMUNITY)
Admission: RE | Admit: 2019-06-20 | Discharge: 2019-06-20 | Disposition: A | Discharge status: Home / Self Care | Payer: Medicare HMO | Source: Ambulatory Visit | Attending: Nurse Practitioner | Admitting: Nurse Practitioner

## 2019-06-20 ENCOUNTER — Encounter (HOSPITAL_COMMUNITY): Payer: Self-pay | Admitting: Nurse Practitioner

## 2019-06-20 VITALS — BP 118/60 | HR 60 | Ht 67.0 in | Wt 162.4 lb

## 2019-06-20 DIAGNOSIS — E785 Hyperlipidemia, unspecified: Secondary | ICD-10-CM | POA: Insufficient documentation

## 2019-06-20 DIAGNOSIS — Z791 Long term (current) use of non-steroidal anti-inflammatories (NSAID): Secondary | ICD-10-CM | POA: Diagnosis not present

## 2019-06-20 DIAGNOSIS — M109 Gout, unspecified: Secondary | ICD-10-CM | POA: Insufficient documentation

## 2019-06-20 DIAGNOSIS — D6869 Other thrombophilia: Secondary | ICD-10-CM

## 2019-06-20 DIAGNOSIS — I2510 Atherosclerotic heart disease of native coronary artery without angina pectoris: Secondary | ICD-10-CM | POA: Insufficient documentation

## 2019-06-20 DIAGNOSIS — I129 Hypertensive chronic kidney disease with stage 1 through stage 4 chronic kidney disease, or unspecified chronic kidney disease: Secondary | ICD-10-CM | POA: Insufficient documentation

## 2019-06-20 DIAGNOSIS — N189 Chronic kidney disease, unspecified: Secondary | ICD-10-CM | POA: Diagnosis not present

## 2019-06-20 DIAGNOSIS — I447 Left bundle-branch block, unspecified: Secondary | ICD-10-CM | POA: Diagnosis not present

## 2019-06-20 DIAGNOSIS — Z7901 Long term (current) use of anticoagulants: Secondary | ICD-10-CM | POA: Insufficient documentation

## 2019-06-20 DIAGNOSIS — I4819 Other persistent atrial fibrillation: Secondary | ICD-10-CM

## 2019-06-20 DIAGNOSIS — Z79899 Other long term (current) drug therapy: Secondary | ICD-10-CM | POA: Insufficient documentation

## 2019-06-20 NOTE — Progress Notes (Signed)
Primary Care Physician: Loraine Leriche., MD Referring Physician:Dr. Bridgett Larsson is a 79 y.o. male with a h/o CAD, CKD, CHB with ICD for h/o ischemic cardiomyopathy,HTN, that  is in the clinic for device clinic finding persistent afib since 05/07/19. He has not missed any of his eliquis 5 mg bid for a CHA2DS2VASc score of 5.   He is unaware of any irregular heart rhythm, denies fatigue/ shortness of breath.  The only trigger is that he had a spell of gout mid January.  F/u in afib clinic one week after cardioversion. He remains  in a paced rhythm. He feels no different in afib vrs SR.  Today, he denies symptoms of palpitations, chest pain, shortness of breath, orthopnea, PND, lower extremity edema, dizziness, presyncope, syncope, or neurologic sequela. The patient is tolerating medications without difficulties and is otherwise without complaint today.   Past Medical History:  Diagnosis Date  . CAD (coronary artery disease)    MI in [place] with stenting in 2010, then MI with CABG in [facility] 06/2010.  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  Marland Kitchen Complete heart block ([place])   . Gout   . Hyperlipidemia   . Hypertension   . Ischemic cardiomyopathy    EF 15% by echo 11/2010 and still 20-25% by follow-up echo 02/2011, s/p St. Jude Bi-V ICD implantation 04/01/11  . LBBB (left bundle branch block)   . Renal insufficiency    Cr 1.6 on 03/25/11   Past Surgical History:  Procedure Laterality Date  . BI-VENTRICULAR IMPLANTABLE CARDIOVERTER DEFIBRILLATOR N/A 04/01/2011   Procedure: BI-VENTRICULAR IMPLANTABLE CARDIOVERTER DEFIBRILLATOR  (CRT-D);  Surgeon: Evans Lance, MD;  Location: [facility] CATH LAB;  Service: Cardiovascular;  Laterality: N/A;  . CARDIAC DEFIBRILLATOR PLACEMENT  12/12   BiV ICD (SJM) implanted by Dr Rayann Heman  . CARDIOVERSION N/A 04/22/2018   Procedure: CARDIOVERSION;  Surgeon: Sueanne Margarita, MD;  Location: [facility] ENDOSCOPY;  Service: Cardiovascular;  Laterality: N/A;  . CARDIOVERSION  N/A 06/13/2019   Procedure: CARDIOVERSION;  Surgeon: Sueanne Margarita, MD;  Location: [facility] ENDOSCOPY;  Service: Cardiovascular;  Laterality: N/A;  . Carpel tunnel surgery    . CORONARY ARTERY BYPASS GRAFT  3/12   in [place]  . EP IMPLANTABLE DEVICE N/A 08/19/2015   Procedure: BIV ICD Generator Changeout;  Surgeon: Thompson Grayer, MD;  Location: [facility] CV LAB;  Service: Cardiovascular;  Laterality: N/A;  . INGUINAL HERNIA REPAIR    . TONSILLECTOMY    . UMBILICAL HERNIA REPAIR      Current Outpatient Medications  Medication Sig Dispense Refill  . acetaminophen (TYLENOL) 500 MG tablet Take 1,000 mg by mouth as needed for moderate pain or headache.     . allopurinol (ZYLOPRIM) 300 MG tablet Take 300 mg by mouth every evening.   11  . carvedilol (COREG) 12.5 MG tablet TAKE 1 TABLET BY MOUTH TWICE DAILY WITH MEALS (Patient taking differently: Take 12.5 mg by mouth in the morning and at bedtime. ) 180 tablet 3  . cholecalciferol (VITAMIN D3) 25 MCG (1000 UNIT) tablet Take 1,000 Units by mouth daily.    . colchicine 0.6 MG tablet Take 0.3 mg by mouth every evening.     Marland Kitchen ELIQUIS 5 MG TABS tablet Take 1 tablet by mouth twice daily (Patient taking differently: Take 5 mg by mouth 2 (two) times daily. ) 60 tablet 5  . finasteride (PROSCAR) 5 MG tablet Take 5 mg by mouth daily.    . furosemide (LASIX) 40  MG tablet Take 1 tablet (40 mg total) by mouth daily. 180 tablet 1  . losartan (COZAAR) 25 MG tablet Take 25 mg by mouth daily.   3  . meloxicam (MOBIC) 15 MG tablet Take 15 mg by mouth as needed for pain. For gout flare ups    . potassium chloride SA (K-DUR,KLOR-CON) 20 MEQ tablet Take 1 tablet (20 mEq total) by mouth daily. 30 tablet 11   No current facility-administered medications for this encounter.    Allergies  Allergen Reactions  . Statins Hives    Muscle pain & severe hives  . Nsaids Other (See Comments)    Renal insufficiency   . Tolmetin Other (See Comments)    Renal insufficiency     Social History   Socioeconomic History  . Marital status: Married    Spouse name: Not on file  . Number of children: 4  . Years of education: Not on file  . Highest education level: Not on file  Occupational History    Comment: Retired  Tobacco Use  . Smoking status: Never Smoker  . Smokeless tobacco: Never Used  Substance and Sexual Activity  . Alcohol use: Yes    Alcohol/week: 2.0 standard drinks    Types: 2 Glasses of wine per week    Comment: occasional  . Drug use: No  . Sexual activity: Yes  Other Topics Concern  . 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   Social Determinants of Corporate investment banker Strain:   . Difficulty of Paying Living Expenses: Not on file  Food Insecurity:   . Worried About Programme researcher, broadcasting/film/video in the Last Year: Not on file  . Ran Out of Food in the Last Year: Not on file  Transportation Needs:   . Lack of Transportation (Medical): Not on file  . Lack of Transportation (Non-Medical): Not on file  Physical Activity:   . Days of Exercise per Week: Not on file  . Minutes of Exercise per Session: Not on file  Stress:   . Feeling of Stress : Not on file  Social Connections:   . Frequency of Communication with Friends and Family: Not on file  . Frequency of Social Gatherings with Friends and Family: Not on file  . Attends Religious Services: Not on file  . Active Member of Clubs or Organizations: Not on file  . Attends Banker Meetings: Not on file  . Marital Status: Not on file  Intimate Partner Violence:   . Fear of Current or Ex-Partner: Not on file  . Emotionally Abused: Not on file  . Physically Abused: Not on file  . Sexually Abused: Not on file    Family History  Problem Relation Age of Onset  . Pancreatic cancer Mother     ROS- All systems are reviewed and negative except as per the HPI above  Physical Exam: Vitals:   06/20/19  1332  BP: 118/60  Pulse: 60  Weight: 73.7 kg  Height: 5\' 7"  (1.702 m)   Wt Readings from Last 3 Encounters:  06/20/19 73.7 kg  06/13/19 72.6 kg  05/23/19 74.8 kg    Labs: Lab Results  Component Value Date   NA 142 06/13/2019   K 4.6 06/13/2019   CL 104 06/13/2019   CO2 25 05/23/2019   GLUCOSE 86 06/13/2019   BUN 45 (H) 06/13/2019   CREATININE 1.90 (H) 06/13/2019  CALCIUM 9.6 05/23/2019   Lab Results  Component Value Date   INR 1.0 03/25/2011   Lab Results  Component Value Date   CHOL 153 07/13/2012   HDL 41 07/13/2012   LDLCALC 92 07/13/2012   TRIG 102 07/13/2012     GEN- The patient is well appearing, alert and oriented x 3 today.   Head- normocephalic, atraumatic Eyes-  Sclera clear, conjunctiva pink Ears- hearing intact Oropharynx- clear Neck- supple, no JVP Lymph- no cervical lymphadenopathy Lungs- Clear to ausculation bilaterally, normal work of breathing Heart- Regular rate and rhythm, no murmurs, rubs or gallops, PMI not laterally displaced GI- soft, NT, ND, + BS Extremities- no clubbing, cyanosis, or edema MS- no significant deformity or atrophy Skin- no rash or lesion Psych- euthymic mood, full affect Neuro- strength and sensation are intact  EKG-AV dual paced rhythm at 60 bpm, pr int 186 ms, qrs int 170 ms, qtc 488 ms Paceart report reviewed from 05/18/19 showing persistent aAF since 05/07/19    Assessment and Plan: 1. Persistent  afib since 05/07/19 He was asymptomatic Continue carvedilol 12.5 mg bid Cardioversion 06/12/19 was successful   2.CHA2DS2VASc score of at least 5 Doing well on eliquis 5 mg bid States no missed doses    F/u with remote checks/device clinic  Lupita Leash C. [facility] [address] [place], [place] [address] [phone]

## 2019-06-26 ENCOUNTER — Ambulatory Visit (INDEPENDENT_AMBULATORY_CARE_PROVIDER_SITE_OTHER): Payer: Medicare HMO

## 2019-06-26 DIAGNOSIS — Z95810 Presence of automatic (implantable) cardiac defibrillator: Secondary | ICD-10-CM | POA: Diagnosis not present

## 2019-06-26 DIAGNOSIS — I5022 Chronic systolic (congestive) heart failure: Secondary | ICD-10-CM | POA: Diagnosis not present

## 2019-06-28 NOTE — Progress Notes (Signed)
EPIC Encounter for ICM Monitoring  Patient Name: Dave Daniels is a 79 y.o. male Date: 06/28/2019 Primary Care Physican: Cheron Schaumann., MD Primary Cardiologist:Crenshaw Electrophysiologist: [name] LastWeight:153lbs  Bi-V Pacing:>99%  AT/AF 12%  Spoke with patient and reports feeling well at this time.  Denies fluid symptoms.  He had a cardioversion 06/13/2019 and report suggests no AT/AF following cardioversion.  Corvue thoracic impedancenormal.  Prescribed:Furosemide 40 mg 1 tablet (40 mg total) daily. Potassium 20 mEq 1 tablet daily.  Labs: 11/23/2018 Creatinine1.59Anastasia Pall, Potassium4.5, [id number], [id number] 11/09/2018 Creatinine1.71, BUN33, Potassium4.2, [id number], [id number] A complete set of results can be found in Results Review  Recommendations: Left voice mail with ICM number and encouraged to call if experiencing any fluid symptoms.  Follow-up plan: ICM clinic phone appointment on4/15/2021. 91 day device clinic remote transmission 07/25/2019.   Copy of ICM check sent to Dr.[name]    3 month ICM trend: 06/26/2019    1 Year ICM trend:       Karie Soda, RN 06/28/2019 3:26 PM

## 2019-07-20 ENCOUNTER — Other Ambulatory Visit: Payer: Self-pay

## 2019-07-20 DIAGNOSIS — I5022 Chronic systolic (congestive) heart failure: Secondary | ICD-10-CM

## 2019-07-20 NOTE — Telephone Encounter (Signed)
This is Dr. Crenshaw's pt 

## 2019-07-21 MED ORDER — FUROSEMIDE 40 MG PO TABS: 40.0000 mg | ORAL_TABLET | Freq: Every day | ORAL | 0 refills | Status: DC

## 2019-07-25 ENCOUNTER — Ambulatory Visit (INDEPENDENT_AMBULATORY_CARE_PROVIDER_SITE_OTHER): Payer: Medicare HMO | Admitting: *Deleted

## 2019-07-25 DIAGNOSIS — I4819 Other persistent atrial fibrillation: Secondary | ICD-10-CM

## 2019-07-25 LAB — CUP PACEART REMOTE DEVICE CHECK
Battery Remaining Longevity: 26 mo
Battery Remaining Percentage: 35 %
Battery Voltage: 2.9 V
Brady Statistic AP VP Percent: 48 %
Brady Statistic AP VS Percent: 1 %
Brady Statistic AS VP Percent: 52 %
Brady Statistic AS VS Percent: 1 %
Brady Statistic RA Percent Paced: 43 %
Date Time Interrogation Session: 20210413113419
HighPow Impedance: 70 Ohm
HighPow Impedance: 72 Ohm
Implantable Lead Implant Date: 20121219
Implantable Lead Implant Date: 20121219
Implantable Lead Implant Date: 20121219
Implantable Lead Location: 753858
Implantable Lead Location: 753859
Implantable Lead Location: 753860
Implantable Pulse Generator Implant Date: 20170508
Lead Channel Impedance Value: 350 Ohm
Lead Channel Impedance Value: 410 Ohm
Lead Channel Impedance Value: 780 Ohm
Lead Channel Pacing Threshold Amplitude: 0.5 V
Lead Channel Pacing Threshold Amplitude: 0.75 V
Lead Channel Pacing Threshold Amplitude: 2.125 V
Lead Channel Pacing Threshold Pulse Width: 0.5 ms
Lead Channel Pacing Threshold Pulse Width: 0.5 ms
Lead Channel Pacing Threshold Pulse Width: 0.6 ms
Lead Channel Sensing Intrinsic Amplitude: 12 mV
Lead Channel Sensing Intrinsic Amplitude: 4.2 mV
Lead Channel Setting Pacing Amplitude: 2 V
Lead Channel Setting Pacing Amplitude: 2 V
Lead Channel Setting Pacing Amplitude: 2.625
Lead Channel Setting Pacing Pulse Width: 0.5 ms
Lead Channel Setting Pacing Pulse Width: 0.6 ms
Lead Channel Setting Sensing Sensitivity: 2 mV
Pulse Gen Serial Number: 7306740

## 2019-07-26 NOTE — Progress Notes (Signed)
ICD Remote  

## 2019-07-28 ENCOUNTER — Ambulatory Visit (INDEPENDENT_AMBULATORY_CARE_PROVIDER_SITE_OTHER): Payer: Medicare HMO

## 2019-07-28 DIAGNOSIS — Z95810 Presence of automatic (implantable) cardiac defibrillator: Secondary | ICD-10-CM | POA: Diagnosis not present

## 2019-07-28 DIAGNOSIS — I5022 Chronic systolic (congestive) heart failure: Secondary | ICD-10-CM

## 2019-07-28 NOTE — Progress Notes (Signed)
EPIC Encounter for ICM Monitoring  Patient Name: Dave Daniels is a 79 y.o. male Date: 07/28/2019 Primary Care Physican: Cheron Schaumann., MD Primary Cardiologist:Crenshaw Electrophysiologist: [name] 4/16Weight:158-160 lbs  Bi-V Pacing:>99%  AT/AF11%  Spoke with patient and reports feeling well at this time.  Denies fluid symptoms.    Corvue thoracic impedancenormal.  Prescribed:Furosemide 40 mg 1 tablet (40 mg total) daily. Potassium 20 mEq 1 tablet daily.  Labs: 11/23/2018 Creatinine1.59Anastasia Pall, Potassium4.5, [id number], CKI21-79 11/09/2018 Creatinine1.71, BUN33, Potassium4.2, [id number], [id number] A complete set of results can be found in Results Review  Recommendations:No changes and encouraged to call if experiencing any fluid symptoms.  Follow-up plan: ICM clinic phone appointment on5/17/2021.91 day device clinic remote transmission 10/24/2019.   Copy of ICM check sent to Dr.[name]  3 month ICM trend: 07/25/2019    1 Year ICM trend:       Karie Soda, RN 07/28/2019 4:46 PM

## 2019-08-02 ENCOUNTER — Telehealth: Payer: Self-pay

## 2019-08-02 NOTE — Telephone Encounter (Signed)
Pt agreed to only send on his scheduled day or when he feel like something is wrong with his ICD.

## 2019-08-08 ENCOUNTER — Other Ambulatory Visit (HOSPITAL_COMMUNITY): Payer: Self-pay | Admitting: Cardiology

## 2019-08-28 ENCOUNTER — Ambulatory Visit (INDEPENDENT_AMBULATORY_CARE_PROVIDER_SITE_OTHER): Payer: Medicare HMO

## 2019-08-28 DIAGNOSIS — Z95810 Presence of automatic (implantable) cardiac defibrillator: Secondary | ICD-10-CM

## 2019-08-28 DIAGNOSIS — I5022 Chronic systolic (congestive) heart failure: Secondary | ICD-10-CM | POA: Diagnosis not present

## 2019-08-28 NOTE — Progress Notes (Signed)
EPIC Encounter for ICM Monitoring  Patient Name: Dave Daniels is a 79 y.o. male Date: 08/28/2019 Primary Care Physican: Cheron Schaumann., MD Primary Cardiologist:Crenshaw Electrophysiologist: [name] 5/18/2021Weight:158-160 lbs  Bi-V Pacing:>99%  AT/AF11%  Spoke with patient and reports feeling well at this time. Denies fluid symptoms during decreased impedance   Corvue thoracic impedancenormal but suggesting possible fluid accumulation 5/5 - 5/10.  Prescribed:Furosemide 40 mg 1 tablet (40 mg total) daily. Potassium 20 mEq 1 tablet daily.  Labs: 11/23/2018 Creatinine1.59Anastasia Pall, Potassium4.5, [id number], PRF16-38 11/09/2018 Creatinine1.71, BUN33, Potassium4.2, [id number], [id number] A complete set of results can be found in Results Review  Recommendations:No changes and encouraged to call if experiencing any fluid symptoms.  Follow-up plan: ICM clinic phone appointment on6/21/2021.91 day device clinic remote transmission 10/24/2019.   Copy of ICM check sent to Dr.[name]  3 month ICM trend: 08/28/2019    1 Year ICM trend:       Karie Soda, RN 08/28/2019 4:39 PM

## 2019-09-12 ENCOUNTER — Telehealth: Payer: Self-pay | Admitting: *Deleted

## 2019-09-12 NOTE — Telephone Encounter (Signed)
Patient is calling to find which office he is being seen at and the time of his appointment on Wednesday. Returned call back to patient and was given date,location and time of appointment.verbalized understanding.

## 2019-09-13 ENCOUNTER — Encounter: Payer: Self-pay | Admitting: Podiatry

## 2019-09-13 ENCOUNTER — Ambulatory Visit: Payer: Medicare HMO | Admitting: Podiatry

## 2019-09-13 ENCOUNTER — Other Ambulatory Visit: Payer: Self-pay

## 2019-09-13 DIAGNOSIS — L600 Ingrowing nail: Secondary | ICD-10-CM

## 2019-09-13 DIAGNOSIS — M21612 Bunion of left foot: Secondary | ICD-10-CM

## 2019-09-13 MED ORDER — GENTAMICIN SULFATE 0.1 % EX CREA: 1.0000 "application " | TOPICAL_CREAM | Freq: Two times a day (BID) | CUTANEOUS | 1 refills | Status: DC

## 2019-09-17 NOTE — Progress Notes (Signed)
Subjective: Patient presents today for evaluation of pain to the lateral border of the left great toe.  Patient has a history of partial nail avulsion to the medial border of the left hallux however now he is experiencing some pain and tenderness to the lateral border.  Patient is concerned for possible ingrown nail. Patient also states that he does have a history of a bunionectomy surgery performed in [place] to the right foot.  He is wanting to know if he should have bunion surgery to the left foot.  It is currently asymptomatic and does not bother him.  Patient presents today for further treatment and evaluation.  Past Medical History:  Diagnosis Date  . CAD (coronary artery disease)    MI in [place] with stenting in 2010, then MI with CABG in [facility] 06/2010.  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  Marland Kitchen Complete heart block (HCC)   . Gout   . Hyperlipidemia   . Hypertension   . Ischemic cardiomyopathy    EF 15% by echo 11/2010 and still 20-25% by follow-up echo 02/2011, s/p St. Jude Bi-V ICD implantation 04/01/11  . LBBB (left bundle branch block)   . Renal insufficiency    Cr 1.6 on 03/25/11    Objective:  General: Well developed, nourished, in no acute distress, alert and oriented x3   Dermatology: Skin is warm, dry and supple bilateral.  Lateral border of the left hallux nail plate appears to be erythematous with evidence of an ingrowing nail. Pain on palpation noted to the border of the nail fold. The remaining nails appear unremarkable at this time. There are no open sores, lesions.  Vascular: Dorsalis Pedis artery and Posterior Tibial artery pedal pulses palpable. No lower extremity edema noted.   Neruologic: Grossly intact via light touch bilateral.  Musculoskeletal: Muscular strength within normal limits in all groups bilateral. Normal range of motion noted to all pedal and ankle joints.  Clinical evidence of a bunion deformity noted to the left hallux  Assesement: #1 Paronychia  with ingrowing nail lateral border left hallux #2  Bunion deformity left hallux-asymptomatic  Plan of Care:  1. Patient evaluated.  2. Discussed treatment alternatives and plan of care. Explained nail avulsion procedure and post procedure course to patient. 3. Patient opted for permanent partial nail avulsion of the lateral border left hallux.  4. Prior to procedure, local anesthesia infiltration utilized using 3 ml of a 50:50 mixture of 2% plain lidocaine and 0.5% plain marcaine in a normal hallux block fashion and a betadine prep performed.  5. Partial permanent nail avulsion with chemical matrixectomy performed using 3x30sec applications of phenol followed by alcohol flush.  6. Light dressing applied. 7.  In regards to the left bunion deformity, I explained to the patient that if it is currently asymptomatic and he does not have any complaints associated with the bunion I do not recommend surgical intervention at this time.  Recommend good supportive shoes.   8.  Return to clinic 2 weeks.  Felecia Shelling, DPM [facility]  Dr. Felecia Shelling, DPM    [address]                                        [place], [place] [address]                Office [phone]  Fax [phone]  [phone]     

## 2019-09-21 ENCOUNTER — Telehealth: Payer: Self-pay | Admitting: *Deleted

## 2019-09-21 NOTE — Telephone Encounter (Signed)
Pt states he had a toenail procedure and it is swollen.

## 2019-09-21 NOTE — Telephone Encounter (Signed)
I spoke with pt and told him at this stage after the procedure he may have redness, oozing and swelling but it should begin to look better. Pt states he feels it is getting better. I told him to continue the soaking and antibiotic cream for 3 weeks and at the end of the 3rd week, do the last soak of the day and leave off the antibiotic cream if the area got a dry hard scab he could stop the soaks. I told pt to call any time with concerns.

## 2019-09-29 ENCOUNTER — Other Ambulatory Visit: Payer: Self-pay

## 2019-09-29 ENCOUNTER — Ambulatory Visit: Payer: Medicare HMO | Admitting: Podiatry

## 2019-09-29 DIAGNOSIS — L03032 Cellulitis of left toe: Secondary | ICD-10-CM | POA: Diagnosis not present

## 2019-09-29 DIAGNOSIS — M79675 Pain in left toe(s): Secondary | ICD-10-CM

## 2019-09-29 MED ORDER — DOXYCYCLINE HYCLATE 100 MG PO TABS: 100.0000 mg | ORAL_TABLET | Freq: Two times a day (BID) | ORAL | 0 refills | Status: DC

## 2019-10-02 ENCOUNTER — Ambulatory Visit (INDEPENDENT_AMBULATORY_CARE_PROVIDER_SITE_OTHER): Payer: Medicare HMO

## 2019-10-02 DIAGNOSIS — I5022 Chronic systolic (congestive) heart failure: Secondary | ICD-10-CM | POA: Diagnosis not present

## 2019-10-02 DIAGNOSIS — Z95810 Presence of automatic (implantable) cardiac defibrillator: Secondary | ICD-10-CM | POA: Diagnosis not present

## 2019-10-03 ENCOUNTER — Encounter: Payer: Self-pay | Admitting: Podiatry

## 2019-10-03 NOTE — Progress Notes (Signed)
EPIC Encounter for ICM Monitoring  Patient Name: [name] is a 79 y.o. male Date: 10/03/2019 Primary Care Physican: Dave Daniels., MD Primary Cardiologist:Dave Daniels Electrophysiologist: Dave Daniels 6/22/2021Weight:158-160lbs  Bi-V Pacing:>99%  AT/AF11%  Spoke with patient and reports feeling well at this time.  Denies fluid symptoms.  He is unsure what may have caused possible fluid accumulation during decreased impedance.  Corvue thoracic impedancenormal but suggesting possible fluid accumulation 6/3 - 6/15.  Prescribed:Furosemide 40 mg 1 tablet (40 mg total) daily. Potassium 20 mEq 1 tablet daily.  Labs: 11/23/2018 Creatinine1.59Anastasia Daniels, Potassium4.5, [id number], KGY17-12 11/09/2018 Creatinine1.71, BUN33, Potassium4.2, [id number], [id number] A complete set of results can be found in Results Review  Recommendations:No changes and encouraged to call if experiencing any fluid symptoms.  Follow-up plan: ICM clinic phone appointment on7/26/2021.91 day device clinic remote transmission7/13/2021.   Copy of ICM check sent to Dr.[name]  3 month ICM trend: 10/02/2019    AT/AF    1 Year ICM trend:       Dave Soda, RN 10/03/2019 9:08 AM

## 2019-10-03 NOTE — Progress Notes (Signed)
Subjective:  Patient ID: Dave Daniels, male    DOB: Mar 06, 1941,  MRN: [id number]  No chief complaint on file.   79 y.o. male presents with the above complaint.  Patient presents with follow-up from left big toe lateral border of the hallux ingrown.  Patient states that there is some swelling present.  There is some redness present around it.  Patient had it removed by Dr. Logan Bores.  There is some draining present.  He denies any other acute complaints.  Is been soaking in Epsom salt.  He has been putting gentamicin ointment.  He denies any other acute complaints.   Review of Systems: Negative except as noted in the HPI. Denies N/V/F/Ch.  Past Medical History:  Diagnosis Date  . CAD (coronary artery disease)    MI in [place] with stenting in 2010, then MI with CABG in [facility] 06/2010.  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  Marland Kitchen Complete heart block (HCC)   . Gout   . Hyperlipidemia   . Hypertension   . Ischemic cardiomyopathy    EF 15% by echo 11/2010 and still 20-25% by follow-up echo 02/2011, s/p St. Jude Bi-V ICD implantation 04/01/11  . LBBB (left bundle branch block)   . Renal insufficiency    Cr 1.6 on 03/25/11    Current Outpatient Medications:  .  acetaminophen (TYLENOL) 500 MG tablet, Take 1,000 mg by mouth as needed for moderate pain or headache. , Disp: , Rfl:  .  allopurinol (ZYLOPRIM) 300 MG tablet, Take 300 mg by mouth every evening. , Disp: , Rfl: 11 .  carvedilol (COREG) 12.5 MG tablet, TAKE 1 TABLET BY MOUTH TWICE DAILY WITH MEALS (Patient taking differently: Take 12.5 mg by mouth in the morning and at bedtime. ), Disp: 180 tablet, Rfl: 3 .  cholecalciferol (VITAMIN D3) 25 MCG (1000 UNIT) tablet, Take 1,000 Units by mouth daily., Disp: , Rfl:  .  colchicine 0.6 MG tablet, Take 0.3 mg by mouth every evening. , Disp: , Rfl:  .  doxycycline (VIBRA-TABS) 100 MG tablet, Take 1 tablet (100 mg total) by mouth 2 (two) times daily., Disp: 28 tablet, Rfl: 0 .  ELIQUIS 5 MG TABS tablet, Take  1 tablet by mouth twice daily, Disp: 180 tablet, Rfl: 1 .  finasteride (PROSCAR) 5 MG tablet, Take 5 mg by mouth daily., Disp: , Rfl:  .  furosemide (LASIX) 40 MG tablet, Take 1 tablet (40 mg total) by mouth daily., Disp: 90 tablet, Rfl: 0 .  gentamicin cream (GARAMYCIN) 0.1 %, Apply 1 application topically 2 (two) times daily., Disp: 15 g, Rfl: 1 .  losartan (COZAAR) 25 MG tablet, Take 25 mg by mouth daily. , Disp: , Rfl: 3 .  meloxicam (MOBIC) 15 MG tablet, Take 15 mg by mouth as needed for pain. For gout flare ups, Disp: , Rfl:  .  potassium chloride SA (K-DUR,KLOR-CON) 20 MEQ tablet, Take 1 tablet (20 mEq total) by mouth daily., Disp: 30 tablet, Rfl: 11  Social History   Tobacco Use  Smoking Status Never Smoker  Smokeless Tobacco Never Used    Allergies  Allergen Reactions  . Statins Hives    Muscle pain & severe hives  . Nsaids Other (See Comments)    Renal insufficiency   . Tolmetin Other (See Comments)    Renal insufficiency   Objective:  There were no vitals filed for this visit. There is no height or weight on file to calculate BMI. Constitutional Well developed. Well nourished.  Vascular Dorsalis  pedis pulses palpable bilaterally. Posterior tibial pulses palpable bilaterally. Capillary refill normal to all digits.  No cyanosis or clubbing noted. Pedal hair growth normal.  Neurologic Normal speech. Oriented to person, place, and time. Epicritic sensation to light touch grossly present bilaterally.  Dermatologic Painful ingrowing nail at lateral nail borders of the hallux nail left at the site of previous ingrown removed.  Mild erythema noted. No other open wounds. No skin lesions.  Orthopedic: Normal joint ROM without pain or crepitus bilaterally. No visible deformities. No bony tenderness.   Radiographs: None Assessment:   1. Paronychia of great toe, left   2. Pain of great toe, left    Plan:  Patient was evaluated and treated and all questions  answered.  Paronychia of the left great toe ingrown -I explained patient the etiology of paronychia versus treatment options were discussed.  Given that there is some erythema present I believe he will benefit from doxycycline 10-day course. -Doxycycline was dispensed. -The ingrown site is healing well.  Scab formation noted. -I will have him follow back up with Dr. Amalia Hailey.  Return in about 1 week (around 10/06/2019).

## 2019-10-06 ENCOUNTER — Emergency Department (HOSPITAL_COMMUNITY)
Admission: EM | Admit: 2019-10-06 | Discharge: 2019-10-06 | Discharge status: Against Medical Advice | Payer: Medicare HMO

## 2019-10-09 ENCOUNTER — Ambulatory Visit (INDEPENDENT_AMBULATORY_CARE_PROVIDER_SITE_OTHER): Payer: Medicare HMO | Admitting: Podiatry

## 2019-10-09 ENCOUNTER — Other Ambulatory Visit: Payer: Self-pay

## 2019-10-09 DIAGNOSIS — L03032 Cellulitis of left toe: Secondary | ICD-10-CM | POA: Diagnosis not present

## 2019-10-09 NOTE — Progress Notes (Signed)
   Subjective: 79 y.o. male presents today status post permanent nail avulsion procedure of the left hallux lateral border that was performed on 09/13/2019.  Patient states that he was doing well until he noticed a significant amount of swelling extending proximal from the toe.  He came in on 09/29/2019 and was seen by Dr. Allena Katz at which time he was placed on oral doxycycline.  He states that over the past week and a half the swelling has improved significantly and he currently has no pain.  He has not been soaking his foot and has been applying gentamicin cream and Neosporin as directed from Dr. Allena Katz.  No new complaints at this time  Past Medical History:  Diagnosis Date  . CAD (coronary artery disease)    MI in [place] with stenting in 2010, then MI with CABG in [facility] 06/2010.  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  Marland Kitchen Complete heart block (HCC)   . Gout   . Hyperlipidemia   . Hypertension   . Ischemic cardiomyopathy    EF 15% by echo 11/2010 and still 20-25% by follow-up echo 02/2011, s/p St. Jude Bi-V ICD implantation 04/01/11  . LBBB (left bundle branch block)   . Renal insufficiency    Cr 1.6 on 03/25/11    Objective: Skin is warm, dry and supple. Nail and respective nail fold appears to be healing appropriately.  Negative for any significant drainage to the nail fold.  There is some mild edema around the great toe.  The patient states that this is improved significantly over the past week  Assessment: #1 postop permanent partial nail avulsion lateral border left hallux #2  Cellulitis left foot-improved  Plan of care: #1 patient was evaluated  #2 debridement of open wound was performed to the periungual border of the respective toe using a currette. Antibiotic ointment and Band-Aid was applied. #3  Continue gentamicin cream daily  #4 continue oral doxycycline 100 mg as prescribed.  Patient states he will finish his antibiotic on Friday, 10/13/2019.  #5 patient is to return to clinic in 2  weeks.   Felecia Shelling, DPM [facility]  Dr. Felecia Shelling, DPM    [address]                                        [place], [place] [address]                Office [phone]  Fax [phone]

## 2019-10-18 ENCOUNTER — Other Ambulatory Visit: Payer: Self-pay

## 2019-10-18 ENCOUNTER — Ambulatory Visit (INDEPENDENT_AMBULATORY_CARE_PROVIDER_SITE_OTHER): Payer: Medicare HMO | Admitting: Podiatry

## 2019-10-18 ENCOUNTER — Encounter: Payer: Self-pay | Admitting: Podiatry

## 2019-10-18 DIAGNOSIS — L03032 Cellulitis of left toe: Secondary | ICD-10-CM | POA: Diagnosis not present

## 2019-10-18 DIAGNOSIS — L600 Ingrowing nail: Secondary | ICD-10-CM | POA: Diagnosis not present

## 2019-10-18 NOTE — Progress Notes (Signed)
   HPI: 79 y.o. male presenting today for follow-up evaluation of an ingrown toenail avulsion procedure that was performed to the lateral border of the left hallux.  Date of procedure 09/13/2019.  Patient was doing well until he noticed a significant amount of swelling from the toe.  He was seen on 09/29/2019 by Dr. Allena Katz and placed on oral doxycycline.  He has since completed the prescription and he presents for follow-up treatment evaluation.  He states that he is doing very well with minimal pain and improved swelling. He does state that recently he had a acute gout attack to his left elbow and that perhaps could have affected the left hallux as well.  Past Medical History:  Diagnosis Date  . CAD (coronary artery disease)    MI in [place] with stenting in 2010, then MI with CABG in [facility] 06/2010.  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  Marland Kitchen Complete heart block (HCC)   . Gout   . Hyperlipidemia   . Hypertension   . Ischemic cardiomyopathy    EF 15% by echo 11/2010 and still 20-25% by follow-up echo 02/2011, s/p St. Jude Bi-V ICD implantation 04/01/11  . LBBB (left bundle branch block)   . Renal insufficiency    Cr 1.6 on 03/25/11     Physical Exam: General: The patient is alert and oriented x3 in no acute distress.  Dermatology: Skin is warm, dry and supple bilateral lower extremities. Negative for open lesions or macerations.  No drainage noted coming from the nail avulsion site  Vascular: Palpable pedal pulses bilaterally.  There is some mild edema associated to the left hallux. Capillary refill within normal limits.  Skin is cool to touch  Neurological: Epicritic and protective threshold grossly intact bilaterally.   Musculoskeletal Exam: Range of motion within normal limits to all pedal and ankle joints bilateral. Muscle strength 5/5 in all groups bilateral.   Assessment: 1.  Status post partial permanent nail avulsion procedure left hallux lateral border.  09/13/2019 2.  Cellulitis left  hallux-resolved   Plan of Care:  1. Patient evaluated.  2.  Patient has completed the doxycycline which was prescribed.  It appears that the toe has mostly healed.  He may resume full activity no restrictions. 3.  Recommend good supportive shoes 4.  Return to clinic as needed      Felecia Shelling, DPM [facility]  Dr. Felecia Shelling, DPM    [address] [place], [place] [address]                Office [phone]  Fax [phone]

## 2019-10-24 ENCOUNTER — Ambulatory Visit (INDEPENDENT_AMBULATORY_CARE_PROVIDER_SITE_OTHER): Payer: Medicare HMO | Admitting: *Deleted

## 2019-10-24 DIAGNOSIS — I4819 Other persistent atrial fibrillation: Secondary | ICD-10-CM | POA: Diagnosis not present

## 2019-10-24 DIAGNOSIS — I255 Ischemic cardiomyopathy: Secondary | ICD-10-CM

## 2019-10-24 LAB — CUP PACEART REMOTE DEVICE CHECK
Battery Remaining Longevity: 23 mo
Battery Remaining Percentage: 32 %
Battery Voltage: 2.89 V
Brady Statistic AP VP Percent: 48 %
Brady Statistic AP VS Percent: 1 %
Brady Statistic AS VP Percent: 51 %
Brady Statistic AS VS Percent: 1 %
Brady Statistic RA Percent Paced: 45 %
Date Time Interrogation Session: 20210713092524
HighPow Impedance: 74 Ohm
HighPow Impedance: 74 Ohm
Implantable Lead Implant Date: 20121219
Implantable Lead Implant Date: 20121219
Implantable Lead Implant Date: 20121219
Implantable Lead Location: 753858
Implantable Lead Location: 753859
Implantable Lead Location: 753860
Implantable Pulse Generator Implant Date: 20170508
Lead Channel Impedance Value: 350 Ohm
Lead Channel Impedance Value: 400 Ohm
Lead Channel Impedance Value: 750 Ohm
Lead Channel Pacing Threshold Amplitude: 0.625 V
Lead Channel Pacing Threshold Amplitude: 0.875 V
Lead Channel Pacing Threshold Amplitude: 2.375 V
Lead Channel Pacing Threshold Pulse Width: 0.5 ms
Lead Channel Pacing Threshold Pulse Width: 0.5 ms
Lead Channel Pacing Threshold Pulse Width: 0.6 ms
Lead Channel Sensing Intrinsic Amplitude: 12 mV
Lead Channel Sensing Intrinsic Amplitude: 4 mV
Lead Channel Setting Pacing Amplitude: 2 V
Lead Channel Setting Pacing Amplitude: 2 V
Lead Channel Setting Pacing Amplitude: 2.875
Lead Channel Setting Pacing Pulse Width: 0.5 ms
Lead Channel Setting Pacing Pulse Width: 0.6 ms
Lead Channel Setting Sensing Sensitivity: 2 mV
Pulse Gen Serial Number: 7306740

## 2019-10-25 NOTE — Progress Notes (Signed)
Remote ICD transmission.   

## 2019-11-06 ENCOUNTER — Ambulatory Visit (INDEPENDENT_AMBULATORY_CARE_PROVIDER_SITE_OTHER): Payer: Medicare HMO

## 2019-11-06 DIAGNOSIS — I5022 Chronic systolic (congestive) heart failure: Secondary | ICD-10-CM | POA: Diagnosis not present

## 2019-11-06 DIAGNOSIS — Z95810 Presence of automatic (implantable) cardiac defibrillator: Secondary | ICD-10-CM | POA: Diagnosis not present

## 2019-11-07 NOTE — Progress Notes (Signed)
EPIC Encounter for ICM Monitoring  Patient Name: Dave Daniels is a 79 y.o. male Date: 11/07/2019 Primary Care Physican: Cheron Schaumann., MD Primary Cardiologist:Crenshaw Electrophysiologist: [name] 6/22/2021Weight:158-160lbs  Bi-V Pacing:>99%  AT/AF11%  Attempted call to patient and unable to reach.  Left detailed message per DPR regarding transmission. Transmission reviewed.   Corvue thoracic impedancenormal.  Prescribed:Furosemide 40 mg 1 tablet (40 mg total) daily. Potassium 20 mEq 1 tablet daily.  Labs: 11/23/2018 Creatinine1.59Anastasia Pall, Potassium4.5, [id number], LPN30-05 11/09/2018 Creatinine1.71, BUN33, Potassium4.2, [id number], [id number] A complete set of results can be found in Results Review  Recommendations: Left voice mail with ICM number and encouraged to call if experiencing any fluid symptoms.  Follow-up plan: ICM clinic phone appointment on 12/11/2019.   91 day device clinic remote transmission 01/23/2020.    EP/Cardiology Office Visits: Recall for 05/14/2020 with Dr. Johney Frame.  Last OV with Dr Jens Som was 11/09/2018.  Copy of ICM check sent to Dr. Johney Frame.   3 month ICM trend: 11/06/2019    1 Year ICM trend:       Karie Soda, RN 11/07/2019 2:40 PM

## 2019-11-09 NOTE — Telephone Encounter (Signed)
error 

## 2019-11-26 ENCOUNTER — Other Ambulatory Visit: Payer: Self-pay | Admitting: Cardiology

## 2019-11-26 DIAGNOSIS — I5022 Chronic systolic (congestive) heart failure: Secondary | ICD-10-CM

## 2019-12-07 DIAGNOSIS — Z7901 Long term (current) use of anticoagulants: Secondary | ICD-10-CM | POA: Insufficient documentation

## 2019-12-11 ENCOUNTER — Ambulatory Visit (INDEPENDENT_AMBULATORY_CARE_PROVIDER_SITE_OTHER): Payer: Medicare HMO

## 2019-12-11 DIAGNOSIS — Z95810 Presence of automatic (implantable) cardiac defibrillator: Secondary | ICD-10-CM | POA: Diagnosis not present

## 2019-12-11 DIAGNOSIS — I5022 Chronic systolic (congestive) heart failure: Secondary | ICD-10-CM | POA: Diagnosis not present

## 2019-12-13 NOTE — Progress Notes (Signed)
EPIC Encounter for ICM Monitoring  Patient Name: Dave Daniels is a 79 y.o. male Date: 12/13/2019 Primary Care Physican: Cheron Schaumann., MD Primary Cardiologist:Crenshaw Electrophysiologist: [name] 9/1/2021Weight:158-160lbs  Bi-V Pacing: >99%  AT/AF10%  Spoke with patient.  He reported he did not take Lasix regularly and ate more restaurant foods during decreased impedance because they had friends visiting.  Corvue thoracic impedancenormal but was suggesting possible fluid accumulation from 8/21-8/29.    Prescribed:Furosemide 40 mg 1 tablet (40 mg total) daily. Potassium 20 mEq 1 tablet daily.  Labs: 06/13/2019 Creatinine 1.90, BUN 4.5, Potassium 4.6, Sodium 142 A complete set of results can be found in Results Review  Recommendations: No changes and encouraged to call if experiencing any fluid symptoms.  Follow-up plan: ICM clinic phone appointment on 01/15/2020.   91 day device clinic remote transmission 01/23/2020.    EP/Cardiology Office Visits: Recall for 05/14/2020 with Dr. Johney Frame. Advise to call the office and schedule appointment with Dr Jens Som since his last visit was 11/09/2018.  Copy of ICM check sent to Dr. Johney Frame.   3 month ICM trend: 12/11/2019    1 Year ICM trend:       Karie Soda, RN 12/13/2019 10:37 AM

## 2019-12-22 ENCOUNTER — Other Ambulatory Visit: Payer: Self-pay | Admitting: Cardiology

## 2019-12-25 ENCOUNTER — Telehealth: Payer: Self-pay | Admitting: Cardiology

## 2019-12-25 NOTE — Telephone Encounter (Signed)
Pt c/o medication issue:  1. Name of Medication: ELIQUIS 5 MG TABS tablet  2. How are you currently taking this medication (dosage and times per day)? 1 tablet by mouth twice daily  3. Are you having a reaction (difficulty breathing--STAT)? No  4. What is your medication issue? Patient states he is no longer able to afford this medication. He is requesting assistance with purchasing or an alternative. Please advise.

## 2019-12-25 NOTE — Telephone Encounter (Signed)
The patient called and stated that he was unable to afford the Eliquis. He will come up and get the paperwork to fill out for assistance. This has been left up front for him.

## 2019-12-27 NOTE — Telephone Encounter (Signed)
Follow Up:     Pt was told that there was a part of his form for his medicine that need to be filled out by the doctor. He wanted to know does that have to be filled out by the doctor or can the nurse do his part?

## 2019-12-27 NOTE — Telephone Encounter (Signed)
LMTCB

## 2019-12-29 NOTE — Telephone Encounter (Signed)
The patient reports he filled out his portion of the patient assistance form for Eliquis and sent it to [facility]. He requests the provider form filled out, signed by MD and faxed directly to [facility] as well.  He was grateful for assistance.

## 2020-01-01 NOTE — Telephone Encounter (Signed)
paperwork printed and will have dr Jens Som sign when he returns to the office next week.

## 2020-01-01 NOTE — Telephone Encounter (Signed)
**Note De-Identified Tyjah Hai Obfuscation** I have completed the provider page of a [employer] Pt Asst application for the pts Eliquis and e-mailed it to Dr Ludwig Clarks nurse so she can obtain his signature , date it, and to fax to [facility] [facility] at the fax number written on cover sheet included.

## 2020-01-18 ENCOUNTER — Telehealth: Payer: Self-pay

## 2020-01-18 NOTE — Telephone Encounter (Signed)
LMOVM for pt to send missed transmission with home remote monitor.

## 2020-01-22 ENCOUNTER — Telehealth: Payer: Self-pay | Admitting: Cardiology

## 2020-01-22 NOTE — Progress Notes (Signed)
No ICM remote transmission received for 01/15/2020 and next ICM transmission scheduled for 02/12/2020.   

## 2020-01-22 NOTE — Telephone Encounter (Signed)
Spoke with pt, aware I was under the impression he was sending his part of the application to me to send it all in together but he reports he has sent his part to the company. Provider part of the application faxed to the company.

## 2020-01-22 NOTE — Telephone Encounter (Signed)
New Message:     Please call, pt says Dave Daniels have not received the information that they need for his Eliquis.

## 2020-01-23 ENCOUNTER — Ambulatory Visit (INDEPENDENT_AMBULATORY_CARE_PROVIDER_SITE_OTHER): Payer: Medicare HMO

## 2020-01-23 DIAGNOSIS — I4819 Other persistent atrial fibrillation: Secondary | ICD-10-CM | POA: Diagnosis not present

## 2020-01-24 LAB — CUP PACEART REMOTE DEVICE CHECK
Battery Remaining Longevity: 22 mo
Battery Remaining Percentage: 30 %
Battery Voltage: 2.87 V
Brady Statistic AP VP Percent: 50 %
Brady Statistic AP VS Percent: 1 %
Brady Statistic AS VP Percent: 50 %
Brady Statistic AS VS Percent: 1 %
Brady Statistic RA Percent Paced: 46 %
Date Time Interrogation Session: 20211012155916
HighPow Impedance: 70 Ohm
HighPow Impedance: 70 Ohm
Implantable Lead Implant Date: 20121219
Implantable Lead Implant Date: 20121219
Implantable Lead Implant Date: 20121219
Implantable Lead Location: 753858
Implantable Lead Location: 753859
Implantable Lead Location: 753860
Implantable Pulse Generator Implant Date: 20170508
Lead Channel Impedance Value: 350 Ohm
Lead Channel Impedance Value: 460 Ohm
Lead Channel Impedance Value: 740 Ohm
Lead Channel Pacing Threshold Amplitude: 0.625 V
Lead Channel Pacing Threshold Amplitude: 0.625 V
Lead Channel Pacing Threshold Amplitude: 2 V
Lead Channel Pacing Threshold Pulse Width: 0.5 ms
Lead Channel Pacing Threshold Pulse Width: 0.5 ms
Lead Channel Pacing Threshold Pulse Width: 0.6 ms
Lead Channel Sensing Intrinsic Amplitude: 12 mV
Lead Channel Sensing Intrinsic Amplitude: 4.3 mV
Lead Channel Setting Pacing Amplitude: 2 V
Lead Channel Setting Pacing Amplitude: 2 V
Lead Channel Setting Pacing Amplitude: 2.5 V
Lead Channel Setting Pacing Pulse Width: 0.5 ms
Lead Channel Setting Pacing Pulse Width: 0.6 ms
Lead Channel Setting Sensing Sensitivity: 2 mV
Pulse Gen Serial Number: 7306740

## 2020-01-26 NOTE — Telephone Encounter (Signed)
Patient calling in to let Stanton Kidney know that Dave Daniels is faxing the papers back. Per patient, there is a little box that needs to be checked regarding whether he is in the hospital or not and then paperwork faxed back to [facility].

## 2020-01-26 NOTE — Progress Notes (Signed)
Remote ICD transmission.   

## 2020-02-27 ENCOUNTER — Ambulatory Visit (INDEPENDENT_AMBULATORY_CARE_PROVIDER_SITE_OTHER): Payer: Medicare HMO

## 2020-02-27 DIAGNOSIS — Z95810 Presence of automatic (implantable) cardiac defibrillator: Secondary | ICD-10-CM | POA: Diagnosis not present

## 2020-02-27 DIAGNOSIS — I5022 Chronic systolic (congestive) heart failure: Secondary | ICD-10-CM | POA: Diagnosis not present

## 2020-03-01 ENCOUNTER — Telehealth: Payer: Self-pay

## 2020-03-01 NOTE — Progress Notes (Signed)
EPIC Encounter for ICM Monitoring  Patient Name: RED MANDT is a 79 y.o. male Date: 03/01/2020 Primary Care Physican: Cheron Schaumann., MD Primary Cardiologist:Crenshaw Electrophysiologist: [name] 9/1/2021Weight:158-160lbs  Bi-V Pacing: >99%  AT/AFBurden 9.8%  Attempted call to patient and unable to reach.  Left detailed message per DPR regarding transmission. Transmission reviewed.   Corvue thoracic impedancenormal but was suggesting possible fluid accumulation from 02/02/20 - 02/12/20 and 11/13--11/15.    Prescribed:Furosemide 40 mg 1 tablet (40 mg total) daily. Potassium 20 mEq 1 tablet daily.  Labs: 06/13/2019 Creatinine 1.90, BUN 4.5, Potassium 4.6, Sodium 142 A complete set of results can be found in Results Review  Recommendations:Left voice mail with ICM number and encouraged to call if experiencing any fluid symptoms.  Follow-up plan: ICM clinic phone appointment on12/20/2021. 91 day device clinic remote transmission 01/23/2020.   EP/Cardiology Office Visits:05/08/2020 with Dr Jens Som. Recall for 2/1/2022with Dr. Johney Frame.  Copy of ICM check sent to Dr.[name].    3 month ICM trend: 02/27/2020    1 Year ICM trend:       Karie Soda, RN 03/01/2020 9:32 AM

## 2020-03-01 NOTE — Telephone Encounter (Signed)
Remote ICM transmission received.  Attempted call to patient regarding ICM remote transmission and left detailed message per DPR.  Advised to return call for any fluid symptoms or questions. Next ICM remote transmission scheduled 04/01/2020.   ° ° °

## 2020-03-14 ENCOUNTER — Encounter: Payer: Self-pay | Admitting: Cardiology

## 2020-03-14 NOTE — Telephone Encounter (Signed)
error 

## 2020-03-25 ENCOUNTER — Telehealth: Payer: Self-pay | Admitting: *Deleted

## 2020-03-25 NOTE — Telephone Encounter (Signed)
Spoke with pt, aware MD part of the application for assistance for eliquis faxed to the company.

## 2020-04-01 ENCOUNTER — Ambulatory Visit (INDEPENDENT_AMBULATORY_CARE_PROVIDER_SITE_OTHER): Payer: Medicare HMO

## 2020-04-01 ENCOUNTER — Telehealth: Payer: Self-pay

## 2020-04-01 DIAGNOSIS — I5022 Chronic systolic (congestive) heart failure: Secondary | ICD-10-CM | POA: Diagnosis not present

## 2020-04-01 DIAGNOSIS — Z95810 Presence of automatic (implantable) cardiac defibrillator: Secondary | ICD-10-CM

## 2020-04-01 NOTE — Progress Notes (Signed)
EPIC Encounter for ICM Monitoring  Patient Name: Dave STEGMAN is a 79 y.o. male Date: 04/01/2020 Primary Care Physican: Cheron Schaumann., MD Primary Cardiologist:Crenshaw Electrophysiologist: [name] 9/1/2021Weight:158-160lbs  Bi-V Pacing: >99%  AT/AFBurden 9.6% (taking Eliquis)  Attempted call to patient and unable to reach.  Left detailed message per DPR regarding transmission. Transmission reviewed.   Corvue thoracic impedancenormalbut was suggesting possible fluid accumulation from 12/7-12/12 and 12/14-12/16.  Prescribed:Furosemide 40 mg 1 tablet (40 mg total) daily. Potassium 20 mEq 1 tablet daily.  Labs: 06/13/2019 Creatinine 1.90, BUN 4.5, Potassium 4.6, Sodium 142 A complete set of results can be found in Results Review  Recommendations:Left voice mail with ICM number and encouraged to call if experiencing any fluid symptoms.  Follow-up plan: ICM clinic phone appointment on2/04/2020. 91 day device clinic remote transmission not scheduled.   EP/Cardiology Office Visits:05/08/2020 with Dr Jens Som. Recall for 2/1/2022with Dr. Johney Frame.  Copy of ICM check sent to Dr.[name].  3 month ICM trend: 04/01/2020    1 Year ICM trend:       Karie Soda, RN 04/01/2020 3:20 PM

## 2020-04-02 NOTE — Telephone Encounter (Signed)
Opened in error

## 2020-04-24 ENCOUNTER — Ambulatory Visit (INDEPENDENT_AMBULATORY_CARE_PROVIDER_SITE_OTHER): Payer: Medicare HMO

## 2020-04-24 DIAGNOSIS — I255 Ischemic cardiomyopathy: Secondary | ICD-10-CM | POA: Diagnosis not present

## 2020-04-24 LAB — CUP PACEART REMOTE DEVICE CHECK
Battery Remaining Longevity: 19 mo
Battery Remaining Percentage: 28 %
Battery Voltage: 2.86 V
Brady Statistic AP VP Percent: 52 %
Brady Statistic AP VS Percent: 1 %
Brady Statistic AS VP Percent: 48 %
Brady Statistic AS VS Percent: 1 %
Brady Statistic RA Percent Paced: 49 %
Date Time Interrogation Session: 20220111133332
HighPow Impedance: 68 Ohm
HighPow Impedance: 68 Ohm
Implantable Lead Implant Date: 20121219
Implantable Lead Implant Date: 20121219
Implantable Lead Implant Date: 20121219
Implantable Lead Location: 753858
Implantable Lead Location: 753859
Implantable Lead Location: 753860
Implantable Pulse Generator Implant Date: 20170508
Lead Channel Impedance Value: 350 Ohm
Lead Channel Impedance Value: 440 Ohm
Lead Channel Impedance Value: 730 Ohm
Lead Channel Pacing Threshold Amplitude: 0.625 V
Lead Channel Pacing Threshold Amplitude: 0.75 V
Lead Channel Pacing Threshold Amplitude: 2.5 V
Lead Channel Pacing Threshold Pulse Width: 0.5 ms
Lead Channel Pacing Threshold Pulse Width: 0.5 ms
Lead Channel Pacing Threshold Pulse Width: 0.6 ms
Lead Channel Sensing Intrinsic Amplitude: 12 mV
Lead Channel Sensing Intrinsic Amplitude: 4.1 mV
Lead Channel Setting Pacing Amplitude: 2 V
Lead Channel Setting Pacing Amplitude: 2 V
Lead Channel Setting Pacing Amplitude: 3 V
Lead Channel Setting Pacing Pulse Width: 0.5 ms
Lead Channel Setting Pacing Pulse Width: 0.6 ms
Lead Channel Setting Sensing Sensitivity: 2 mV
Pulse Gen Serial Number: 7306740

## 2020-04-30 ENCOUNTER — Telehealth: Payer: Self-pay | Admitting: Cardiology

## 2020-04-30 NOTE — Telephone Encounter (Signed)
Patient calling the office for samples of medication:   1.  What medication and dosage are you requesting samples for?  ELIQUIS 5 MG TABS tablet [[id number]]   2.  Are you currently out of this medication?   yes

## 2020-05-01 MED ORDER — APIXABAN 5 MG PO TABS
5.0000 mg | ORAL_TABLET | Freq: Two times a day (BID) | ORAL | 0 refills | Status: DC
Start: 2020-05-01 — End: 2021-07-31

## 2020-05-01 NOTE — Telephone Encounter (Signed)
Called patient aware 2 weeks of samples available for pick up   SAMPLES PLACED at front desk

## 2020-05-02 NOTE — Progress Notes (Signed)
HPI: FU coronary artery disease status post coronary artery bypass and graft as well as ischemic cardiomyopathy. Patient's cardiac history dates back to 2010 when he had his first myocardial infarction. He had stents placed in [place]. He had a second myocardial infarction in March of 2012 and then had coronary artery bypass and graft.Cardiac catheterization was performed in August of 2012. Ejection fraction was 20%. The right coronary and LAD were occluded and there was critical circumflex disease. There was a patent saphenous vein graft to the right coronary artery. The LIMA to the LAD was patent. The saphenous vein graft to the obtuse marginal was also patent.Patient had biventricular ICD placed in December of 2012.Last echocardiogram December 2017 showed ejection fraction 25-30%, grade 1 diastolic dysfunction, mild aortic insufficiency and mild left atrial enlargement.Has had previous DCCV. Since last seen,patient denies dyspnea, chest pain, palpitations or syncope.  Current Outpatient Medications  Medication Sig Dispense Refill  . acetaminophen (TYLENOL) 500 MG tablet Take 1,000 mg by mouth as needed for moderate pain or headache.     . allopurinol (ZYLOPRIM) 300 MG tablet Take 300 mg by mouth every evening.   11  . apixaban (ELIQUIS) 5 MG TABS tablet Take 1 tablet (5 mg total) by mouth 2 (two) times daily. 28 tablet 0  . carvedilol (COREG) 12.5 MG tablet TAKE 1 TABLET BY MOUTH TWICE DAILY WITH MEALS 180 tablet 2  . cholecalciferol (VITAMIN D3) 25 MCG (1000 UNIT) tablet Take 1,000 Units by mouth daily.    . colchicine 0.6 MG tablet Take 0.3 mg by mouth every evening.    . finasteride (PROSCAR) 5 MG tablet Take 5 mg by mouth daily.    . furosemide (LASIX) 40 MG tablet TAKE 1 TABLET BY MOUTH ONCE DAILY 90 tablet 2  . losartan (COZAAR) 25 MG tablet Take 25 mg by mouth daily.   3  . meloxicam (MOBIC) 15 MG tablet Take 15 mg by mouth as needed for pain. For gout flare ups    . potassium  chloride SA (K-DUR,KLOR-CON) 20 MEQ tablet Take 1 tablet (20 mEq total) by mouth daily. 30 tablet 11   No current facility-administered medications for this visit.     Past Medical History:  Diagnosis Date  . CAD (coronary artery disease)    MI in [place] with stenting in 2010, then MI with CABG in [facility] 06/2010.  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  Marland Kitchen Complete heart block (HCC)   . Gout   . Hyperlipidemia   . Hypertension   . Ischemic cardiomyopathy    EF 15% by echo 11/2010 and still 20-25% by follow-up echo 02/2011, s/p St. Jude Bi-V ICD implantation 04/01/11  . LBBB (left bundle branch block)   . Renal insufficiency    Cr 1.6 on 03/25/11    Past Surgical History:  Procedure Laterality Date  . BI-VENTRICULAR IMPLANTABLE CARDIOVERTER DEFIBRILLATOR N/A 04/01/2011   Procedure: BI-VENTRICULAR IMPLANTABLE CARDIOVERTER DEFIBRILLATOR  (CRT-D);  Surgeon: Marinus Maw, MD;  Location: [facility] CATH LAB;  Service: Cardiovascular;  Laterality: N/A;  . CARDIAC DEFIBRILLATOR PLACEMENT  12/12   BiV ICD (SJM) implanted by Dr Johney Frame  . CARDIOVERSION N/A 04/22/2018   Procedure: CARDIOVERSION;  Surgeon: Quintella Reichert, MD;  Location[facility] ENDOSCOPY;  Service: Cardiovascular;  Laterality: N/A;  . CARDIOVERSION N/A 06/13/2019   Procedure: CARDIOVERSION;  Surgeon: Quintella Reichert, MD;  Location: [facility] ENDOSCOPY;  Service: Cardiovascular;  Laterality: N/A;  . Carpel tunnel surgery    . CORONARY ARTERY  BYPASS GRAFT  3/12   in [place]  . EP IMPLANTABLE DEVICE N/A 08/19/2015   Procedure: BIV ICD Generator Changeout;  Surgeon: Hillis Range, MD;  Location: [facility] INVASIVE CV LAB;  Service: Cardiovascular;  Laterality: N/A;  . INGUINAL HERNIA REPAIR    . TONSILLECTOMY    . UMBILICAL HERNIA REPAIR      Social History   Socioeconomic History  . Marital status: Married    Spouse name: Not on file  . Number of children: 4  . Years of education: Not on file  . Highest education level: Not on file  Occupational History     Comment: Retired  Tobacco Use  . Smoking status: Never Smoker  . Smokeless tobacco: Never Used  Vaping Use  . Vaping Use: Never used  Substance and Sexual Activity  . Alcohol use: Yes    Alcohol/week: 2.0 standard drinks    Types: 2 Glasses of wine per week    Comment: occasional  . Drug use: No  . Sexual activity: Yes  Other Topics Concern  . Not on file  Social History Narrative   Lives in [place], recently moved from [place].  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   Social Determinants of Corporate investment banker Strain: Not on file  Food Insecurity: Not on file  Transportation Needs: Not on file  Physical Activity: Not on file  Stress: Not on file  Social Connections: Not on file  Intimate Partner Violence: Not on file    Family History  Problem Relation Age of Onset  . Pancreatic cancer Mother     ROS: no fevers or chills, productive cough, hemoptysis, dysphasia, odynophagia, melena, hematochezia, dysuria, hematuria, rash, seizure activity, orthopnea, PND, pedal edema, claudication. Remaining systems are negative.  Physical Exam: Well-developed well-nourished in no acute distress.  Skin is warm and dry.  HEENT is normal.  Neck is supple.  Chest is clear to auscultation with normal expansion.  Cardiovascular exam is regular rate and rhythm.  Abdominal exam nontender or distended. No masses palpated. Extremities show no edema. neuro grossly intact  ECG-sinus rhythm with ventricular pacing.  Personally reviewed  A/P  1 coronary artery disease-patient doing well with no chest pain.  Continue medical therapy.  Note he is not on aspirin given need for apixaban.  He is intolerant to statins.  2 history of paroxysmal atrial fibrillation-continue carvedilol for rate control if atrial fibrillation recurs.  Continue apixaban.  Note he was not particularly symptomatic previously and if atrial fibrillation recurs best option is likely rate control.  Check hemoglobin and renal  function.  3 ischemic cardiomyopathy-continue beta-blocker.  Discontinue losartan.  Instead we will treat with Entresto 24/26 twice daily.  In 1 week check potassium and renal function.  Repeat echocardiogram.  4 hypertension-blood pressure controlled.  Continue present medical regimen.  5 hyperlipidemia-intolerant to statins.  Check lipids and if LDL elevated and will consider Zetia or Repatha.  6 chronic systolic congestive heart failure-patient appears to be euvolemic.  Continue Lasix at present dose.  Check potassium and renal function.  Olga Millers, MD

## 2020-05-07 NOTE — Progress Notes (Signed)
Remote ICD transmission.   

## 2020-05-08 ENCOUNTER — Encounter: Payer: Self-pay | Admitting: Cardiology

## 2020-05-08 ENCOUNTER — Other Ambulatory Visit: Payer: Self-pay

## 2020-05-08 ENCOUNTER — Ambulatory Visit (INDEPENDENT_AMBULATORY_CARE_PROVIDER_SITE_OTHER): Payer: Medicare HMO | Admitting: Cardiology

## 2020-05-08 VITALS — BP 130/60 | HR 60 | Ht 67.0 in | Wt 158.0 lb

## 2020-05-08 DIAGNOSIS — I4819 Other persistent atrial fibrillation: Secondary | ICD-10-CM

## 2020-05-08 DIAGNOSIS — I5022 Chronic systolic (congestive) heart failure: Secondary | ICD-10-CM

## 2020-05-08 DIAGNOSIS — Z95810 Presence of automatic (implantable) cardiac defibrillator: Secondary | ICD-10-CM | POA: Diagnosis not present

## 2020-05-08 DIAGNOSIS — I2583 Coronary atherosclerosis due to lipid rich plaque: Secondary | ICD-10-CM

## 2020-05-08 DIAGNOSIS — E7800 Pure hypercholesterolemia, unspecified: Secondary | ICD-10-CM

## 2020-05-08 DIAGNOSIS — I2510 Atherosclerotic heart disease of native coronary artery without angina pectoris: Secondary | ICD-10-CM

## 2020-05-08 DIAGNOSIS — I255 Ischemic cardiomyopathy: Secondary | ICD-10-CM | POA: Diagnosis not present

## 2020-05-08 DIAGNOSIS — I10 Essential (primary) hypertension: Secondary | ICD-10-CM

## 2020-05-08 MED ORDER — SACUBITRIL-VALSARTAN 24-26 MG PO TABS: 1.0000 | ORAL_TABLET | Freq: Two times a day (BID) | ORAL | 12 refills | Status: DC

## 2020-05-08 NOTE — Patient Instructions (Signed)
Medication Instructions:   STOP LOSARTAN   START ENTRESTO 24/26 MG ONE TABLET TWICE DAILY  *If you need a refill on your cardiac medications before your next appointment, please call your pharmacy*   Lab Work:  Your physician recommends that you return for lab work in: ONE [facility]  If you have labs (blood work) drawn today and your tests are completely normal, you will receive your results only by: Marland Kitchen MyChart Message (if you have MyChart) OR . A paper copy in the mail If you have any lab test that is abnormal or we need to change your treatment, we will call you to review the results.   Testing/Procedures:  Your physician has requested that you have an echocardiogram. Echocardiography is a painless test that uses sound waves to create images of your heart. It provides your doctor with information about the size and shape of your heart and how well your heart's chambers and valves are working. This procedure takes approximately one hour. There are no restrictions for this procedure.[place] [facility]- 1ST FLOOR IMAGING     Follow-Up: At [facility], you and your health needs are our priority.  As part of our continuing mission to provide you with exceptional heart care, we have created designated Provider Care Teams.  These Care Teams include your primary Cardiologist (physician) and Advanced Practice Providers (APPs -  Physician Assistants and Nurse Practitioners) who all work together to provide you with the care you need, when you need it.  We recommend signing up for the patient portal called "MyChart".  Sign up information is provided on this After Visit Summary.  MyChart is used to connect with patients for Virtual Visits (Telemedicine).  Patients are able to view lab/test results, encounter notes, upcoming appointments, etc.  Non-urgent messages can be sent to your provider as well.   To learn more about what you can do with MyChart, go to [email or web address].    Your  next appointment:   12 month(s)  The format for your next appointment:   In Person  Provider:   Olga Millers, MD

## 2020-05-14 ENCOUNTER — Ambulatory Visit (INDEPENDENT_AMBULATORY_CARE_PROVIDER_SITE_OTHER): Payer: Medicare HMO

## 2020-05-14 DIAGNOSIS — I5022 Chronic systolic (congestive) heart failure: Secondary | ICD-10-CM

## 2020-05-14 DIAGNOSIS — Z95810 Presence of automatic (implantable) cardiac defibrillator: Secondary | ICD-10-CM | POA: Diagnosis not present

## 2020-05-15 NOTE — Progress Notes (Signed)
EPIC Encounter for ICM Monitoring  Patient Name: Dave Daniels is a 80 y.o. male Date: 05/15/2020 Primary Care Physican: Cheron Schaumann., MD Primary Cardiologist:Crenshaw Electrophysiologist: [name] 05/08/2020 OfficeWeight:158lbs  Bi-V Pacing: >99%  AT/AFBurden 9.4% (taking Eliquis)  Spoke with patient and he is feeling well.  He reports eating out with friends over the last few days which may have contributed to the suggesting of fluid accumulation.  Corvue thoracic impedancesuggesting possible fluid accumulation starting 05/11/2020.  Prescribed: Furosemide 40 mg 1 tablet (40 mg total) daily.  Potassium 20 mEq 1 tablet daily.  Labs: 06/13/2019 Creatinine 1.90, BUN 4.5, Potassium 4.6, Sodium 142 A complete set of results can be found in Results Review  Recommendations:   Advised to limit salt intake daily.  He is compliant with taking Furosemide.  Follow-up plan: ICM clinic phone appointment on2/14/2022 to recheck fluid levels. 91 day device clinic remote transmission 07/24/2020.   EP/Cardiology Office Visits:05/08/2020 with Dr Jens Som.Recall for 2/1/2022with Dr. Johney Frame.  Copy of ICM check sent to Dr.[name] and Dr Jens Som.  3 month ICM trend: 05/14/2020.    1 Year ICM trend:       Karie Soda, RN 05/15/2020 1:58 PM

## 2020-05-17 NOTE — Telephone Encounter (Signed)
Patient calling the office for samples of medication:   1.  What medication and dosage are you requesting samples for? apixaban (ELIQUIS) 5 MG TABS tablet  2.  Are you currently out of this medication?   No, patient states he has a 4-day supply remaining. He states he is currently awaiting approval from [facility] patient assistance.

## 2020-05-17 NOTE — Telephone Encounter (Signed)
Attempted to call patient. Voicemail is full. Samples of Eliquis 5mg  have been left at front desk for pickup.   LOT: EXP: 04/24

## 2020-05-18 LAB — CBC
Hematocrit: 44.3 % (ref 37.5–51.0)
Hemoglobin: 14.6 g/dL (ref 13.0–17.7)
MCH: 29.1 pg (ref 26.6–33.0)
MCHC: 33 g/dL (ref 31.5–35.7)
MCV: 88 fL (ref 79–97)
Platelets: 236 10*3/uL (ref 150–450)
RBC: 5.01 x10E6/uL (ref 4.14–5.80)
RDW: 13.4 % (ref 11.6–15.4)
WBC: 6.1 10*3/uL (ref 3.4–10.8)

## 2020-05-18 LAB — COMPREHENSIVE METABOLIC PANEL
ALT: 9 IU/L (ref 0–44)
AST: 13 IU/L (ref 0–40)
Albumin/Globulin Ratio: 1.5 (ref 1.2–2.2)
Albumin: 4.3 g/dL (ref 3.7–4.7)
Alkaline Phosphatase: 96 IU/L (ref 44–121)
BUN/Creatinine Ratio: 16 (ref 10–24)
BUN: 32 mg/dL — ABNORMAL HIGH (ref 8–27)
Bilirubin Total: 0.6 mg/dL (ref 0.0–1.2)
CO2: 24 mmol/L (ref 20–29)
Calcium: 9.9 mg/dL (ref 8.6–10.2)
Chloride: 103 mmol/L (ref 96–106)
Creatinine, Ser: 1.96 mg/dL — ABNORMAL HIGH (ref 0.76–1.27)
GFR calc Af Amer: 37 mL/min/{1.73_m2} — ABNORMAL LOW (ref >59)
GFR calc non Af Amer: 32 mL/min/{1.73_m2} — ABNORMAL LOW (ref >59)
Globulin, Total: 2.8 g/dL (ref 1.5–4.5)
Glucose: 101 mg/dL — ABNORMAL HIGH (ref 65–99)
Potassium: 4.4 mmol/L (ref 3.5–5.2)
Sodium: 144 mmol/L (ref 134–144)
Total Protein: 7.1 g/dL (ref 6.0–8.5)

## 2020-05-18 LAB — LIPID PANEL
Chol/HDL Ratio: 4.6 ratio (ref 0.0–5.0)
Cholesterol, Total: 236 mg/dL — ABNORMAL HIGH (ref 100–199)
HDL: 51 mg/dL (ref >39)
LDL Chol Calc (NIH): 167 mg/dL — ABNORMAL HIGH (ref 0–99)
Triglycerides: 99 mg/dL (ref 0–149)
VLDL Cholesterol Cal: 18 mg/dL (ref 5–40)

## 2020-05-22 ENCOUNTER — Other Ambulatory Visit: Payer: Self-pay | Admitting: *Deleted

## 2020-05-22 ENCOUNTER — Encounter: Payer: Self-pay | Admitting: *Deleted

## 2020-05-22 DIAGNOSIS — E7800 Pure hypercholesterolemia, unspecified: Secondary | ICD-10-CM

## 2020-05-27 ENCOUNTER — Other Ambulatory Visit: Payer: Self-pay

## 2020-05-27 ENCOUNTER — Ambulatory Visit (INDEPENDENT_AMBULATORY_CARE_PROVIDER_SITE_OTHER): Payer: Medicare HMO

## 2020-05-27 ENCOUNTER — Ambulatory Visit (HOSPITAL_BASED_OUTPATIENT_CLINIC_OR_DEPARTMENT_OTHER)
Admission: RE | Admit: 2020-05-27 | Discharge: 2020-05-27 | Disposition: A | Discharge status: Home / Self Care | Payer: Medicare HMO | Source: Ambulatory Visit | Attending: Cardiology | Admitting: Cardiology

## 2020-05-27 DIAGNOSIS — I255 Ischemic cardiomyopathy: Secondary | ICD-10-CM | POA: Diagnosis present

## 2020-05-27 DIAGNOSIS — I5022 Chronic systolic (congestive) heart failure: Secondary | ICD-10-CM

## 2020-05-27 DIAGNOSIS — Z95810 Presence of automatic (implantable) cardiac defibrillator: Secondary | ICD-10-CM

## 2020-05-27 LAB — ECHOCARDIOGRAM COMPLETE
Area-P 1/2: 2.76 cm2
P 1/2 time: 559 msec
S' Lateral: 4.92 cm

## 2020-05-28 ENCOUNTER — Telehealth: Payer: Self-pay | Admitting: Cardiology

## 2020-05-28 NOTE — Telephone Encounter (Signed)
Dave Daniels is calling stating after his Echo yesterday his left hand got extremely cold and his index and middle finger became numb and yellowish. He states his wife had to rub his hand to bring back the color and blood flow. He states this has never happened to him before and it has not occurred since. He is also requesting his Echo results when called back. Please advise.

## 2020-05-28 NOTE — Telephone Encounter (Signed)
Spoke with patient - relayed echo results.   Patient recently stopped losartan - started entresto   He needs assistance - will come by to pick up application tomorrow

## 2020-05-31 NOTE — Progress Notes (Signed)
EPIC Encounter for ICM Monitoring  Patient Name: Dave Daniels is a 80 y.o. male Date: 05/31/2020 Primary Care Physican: Cheron Schaumann., MD Primary Cardiologist:Crenshaw Electrophysiologist: [name] 05/08/2020 OfficeWeight:158lbs  Bi-V Pacing: >99%  AT/AFBurden 9.4%(taking Eliquis)  Spoke with patient and reports feeling well at this time.  Denies fluid symptoms.    Corvue thoracic impedancesuggesting fluid levels returned to normal.  Prescribed: Furosemide 40 mg 1 tablet (40 mg total) daily.  Potassium 20 mEq 1 tablet daily.  Labs: 06/13/2019 Creatinine 1.90, BUN 4.5, Potassium 4.6, Sodium 142 A complete set of results can be found in Results Review  Recommendations:   No changes and encouraged to call if experiencing any fluid symptoms.  Follow-up plan: ICM clinic phone appointment on3/10/2020. 91 day device clinic remote transmission4/13/2022.   EP/Cardiology Office Visits:Recall for 2/1/2022with Dr. Johney Frame.  Copy of ICM check sent to Dr.[name].  3 month ICM trend: 05/27/2020.    1 Year ICM trend:       Karie Soda, RN 05/31/2020 8:56 AM

## 2020-06-10 DIAGNOSIS — W19XXXA Unspecified fall, initial encounter: Secondary | ICD-10-CM | POA: Insufficient documentation

## 2020-06-11 ENCOUNTER — Other Ambulatory Visit: Payer: Self-pay

## 2020-06-11 ENCOUNTER — Ambulatory Visit (INDEPENDENT_AMBULATORY_CARE_PROVIDER_SITE_OTHER): Payer: Medicare HMO | Admitting: Pharmacist Clinician (PhC)/ Clinical Pharmacy Specialist

## 2020-06-11 VITALS — BP 110/58 | HR 60 | Resp 16 | Ht 67.0 in | Wt 168.4 lb

## 2020-06-11 DIAGNOSIS — E7800 Pure hypercholesterolemia, unspecified: Secondary | ICD-10-CM

## 2020-06-11 MED ORDER — NEXLIZET 180-10 MG PO TABS: 180.0000 mg | ORAL_TABLET | Freq: Every day | ORAL | 6 refills | Status: DC

## 2020-06-11 NOTE — Patient Instructions (Addendum)
Your Results:             Your most recent labs Goal  Total Cholesterol 236 < 200  Triglycerides 99 < 150  HDL (happy/good cholesterol) 51 > 40  LDL (lousy/bad cholesterol 167 < 70      Medication changes: Start Nexlizet 1 tablet daily    Lab orders: You will need to complete a Comprehensive metabolic panel and Uric Acid lab 2 weeks after starting the Nexlizet. You will also need a lipid and hepatic panel about 2-3 months after starting the medication.   Patient Assistance:  The [employer] foundation offers assistance to help pay for medication copays.  They will cover copays for all cholesterol lowering meds, including statins, fibrates, omega-3 oils, ezetimibe, Repatha, Praluent, Nexletol, Nexlizet.  The cards are usually good for $2,500 or 12 months, whichever comes first. 1. Go to healthwellfoundation.org 2. Click on "Apply Now" 3. Answer questions as to whom is applying (patient or representative) 4. Your disease fund will be "hypercholesterolemia - Medicare access" 5. They will ask questions about finances and which medications you are taking for cholesterol 6. When you submit, the approval is usually within minutes.  You will need to print the card information from the site 7. You will need to show this information to your pharmacy, they will bill your Medicare Part D plan first -then bill [employer] --for the copay.   You can also call them at [phone], although the hold times can be quite long.   Thank you for choosing [facility]

## 2020-06-11 NOTE — Progress Notes (Signed)
06/13/2020 Dave Daniels [birth year] [id number]   HPI:  Dave Daniels is a 80 y.o. male patient of Dr Jens Som, who presents today for a lipid clinic evaluation.  See pertinent past medical history below.  He is originally from [place], but lived in [place] for many years, which is where most of his cardiac care was given.  He has tried 2 different statin drugs in the past, however both caused him to have hives, as well as some myalgias.    He is in the office today to discuss further options for cholesterol lowering.    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 acetaminophen (TYLENOL) 500 MG tablet Take 1,000 mg by mouth as needed for moderate pain or headache.     . allopurinol (ZYLOPRIM) 300 MG tablet Take 300 mg by mouth every evening.   11  . apixaban (ELIQUIS) 5 MG TABS tablet Take 1 tablet (5 mg total) by mouth 2 (two) times daily. 28 tablet 0  . Bempedoic Acid-Ezetimibe (NEXLIZET) 180-10 MG TABS Take 180 mg by mouth daily. 30 tablet 6  . carvedilol (COREG) 12.5 MG tablet TAKE 1 TABLET BY MOUTH TWICE DAILY WITH MEALS 180 tablet 2  . cholecalciferol (VITAMIN D3)  25 MCG (1000 UNIT) tablet Take 1,000 Units by mouth daily.    . colchicine 0.6 MG tablet Take 0.3 mg by mouth every evening.    . finasteride (PROSCAR) 5 MG tablet Take 5 mg by mouth daily.    . furosemide (LASIX) 40 MG tablet TAKE 1 TABLET BY MOUTH ONCE DAILY 90 tablet 2  . meloxicam (MOBIC) 15 MG tablet Take 15 mg by mouth as needed for pain. For gout flare ups    . potassium chloride SA (K-DUR,KLOR-CON) 20 MEQ tablet Take 1 tablet (20 mEq total) by mouth daily. 30 tablet 11  . sacubitril-valsartan (ENTRESTO) 24-26 MG Take 1 tablet by mouth 2 (two) times daily. 60 tablet 12   No current facility-administered medications for this visit.    Allergies  Allergen Reactions  . Statins Hives    Muscle pain & severe hives  . Nsaids Other (See Comments)    Renal insufficiency   . Tolmetin Other (See Comments)    Renal insufficiency  . Atorvastatin Rash  . Pravastatin Rash    rash    Past Medical History:  Diagnosis Date  . CAD (coronary artery disease)    MI in [place] with stenting in 2010, then MI with CABG in [facility] 06/2010.  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  Marland Kitchen Complete heart block (HCC)   . Gout   . Hyperlipidemia   .  Hypertension   . Ischemic cardiomyopathy    EF 15% by echo 11/2010 and still 20-25% by follow-up echo 02/2011, s/p St. Jude Bi-V ICD implantation 04/01/11  . LBBB (left bundle branch block)   . Renal insufficiency    Cr 1.6 on 03/25/11    Blood pressure (!) 110/58, pulse 60, resp. rate 16, height 5\' 7"  (1.702 m), weight 168 lb 6.4 oz (76.4 kg), SpO2 99 %.   Hyperlipidemia Patient with hyperlipidemia and history of extensive ASCVD, currently not on any LDL lowering medications. Reviewed options for lowering LDL cholesterol, including ezetimibe, PCSK-9 inhibitors, bempedoic acid and inclisiran.  Discussed mechanisms of action, dosing, side effects and potential decreases in LDL cholesterol.  Answered all patient questions.  Based on this information, patient would  prefer to start Nexlizet.  He is aware of the increased risk of gout flare, with his history, but willing to try.  Will repeat complete metabolic panel and uric acid levels in 2 weeks and lipid panel in 12 weeks.       PharmD CPP [facility] [address] [place], [place] [phone]

## 2020-06-13 ENCOUNTER — Encounter: Payer: Self-pay | Admitting: Pharmacist Clinician (PhC)/ Clinical Pharmacy Specialist

## 2020-06-13 NOTE — Assessment & Plan Note (Signed)
Patient with hyperlipidemia and history of extensive ASCVD, currently not on any LDL lowering medications. Reviewed options for lowering LDL cholesterol, including ezetimibe, PCSK-9 inhibitors, bempedoic acid and inclisiran.  Discussed mechanisms of action, dosing, side effects and potential decreases in LDL cholesterol.  Answered all patient questions.  Based on this information, patient would prefer to start Nexlizet.  He is aware of the increased risk of gout flare, with his history, but willing to try.  Will repeat complete metabolic panel and uric acid levels in 2 weeks and lipid panel in 12 weeks.

## 2020-06-17 ENCOUNTER — Ambulatory Visit (INDEPENDENT_AMBULATORY_CARE_PROVIDER_SITE_OTHER): Payer: Medicare HMO | Admitting: Podiatry

## 2020-06-17 ENCOUNTER — Other Ambulatory Visit: Payer: Self-pay

## 2020-06-17 DIAGNOSIS — M79675 Pain in left toe(s): Secondary | ICD-10-CM | POA: Diagnosis not present

## 2020-06-17 DIAGNOSIS — L603 Nail dystrophy: Secondary | ICD-10-CM

## 2020-06-24 ENCOUNTER — Other Ambulatory Visit: Payer: Self-pay

## 2020-06-24 MED ORDER — NEXLIZET 180-10 MG PO TABS: 180.0000 mg | ORAL_TABLET | Freq: Every day | ORAL | 6 refills | Status: DC

## 2020-06-25 ENCOUNTER — Ambulatory Visit (INDEPENDENT_AMBULATORY_CARE_PROVIDER_SITE_OTHER): Payer: Medicare HMO

## 2020-06-25 DIAGNOSIS — I5022 Chronic systolic (congestive) heart failure: Secondary | ICD-10-CM

## 2020-06-25 DIAGNOSIS — Z95810 Presence of automatic (implantable) cardiac defibrillator: Secondary | ICD-10-CM | POA: Diagnosis not present

## 2020-06-26 LAB — COMPREHENSIVE METABOLIC PANEL
ALT: 11 IU/L (ref 0–44)
AST: 15 IU/L (ref 0–40)
Albumin/Globulin Ratio: 1.7 (ref 1.2–2.2)
Albumin: 4.2 g/dL (ref 3.7–4.7)
Alkaline Phosphatase: 72 IU/L (ref 44–121)
BUN/Creatinine Ratio: 20 (ref 10–24)
BUN: 45 mg/dL — ABNORMAL HIGH (ref 8–27)
Bilirubin Total: 0.7 mg/dL (ref 0.0–1.2)
CO2: 24 mmol/L (ref 20–29)
Calcium: 9.8 mg/dL (ref 8.6–10.2)
Chloride: 105 mmol/L (ref 96–106)
Creatinine, Ser: 2.2 mg/dL — ABNORMAL HIGH (ref 0.76–1.27)
Globulin, Total: 2.5 g/dL (ref 1.5–4.5)
Glucose: 92 mg/dL (ref 65–99)
Potassium: 5 mmol/L (ref 3.5–5.2)
Sodium: 144 mmol/L (ref 134–144)
Total Protein: 6.7 g/dL (ref 6.0–8.5)
eGFR: 30 mL/min/{1.73_m2} — ABNORMAL LOW (ref >59)

## 2020-06-26 LAB — URIC ACID: Uric Acid: 9.8 mg/dL — ABNORMAL HIGH (ref 3.8–8.4)

## 2020-06-27 ENCOUNTER — Telehealth: Payer: Self-pay | Admitting: Cardiology

## 2020-06-27 NOTE — Telephone Encounter (Signed)
Returned a call to the pharmacy and actually got the medication approved for free of charge

## 2020-06-27 NOTE — Telephone Encounter (Signed)
   Pt c/o medication issue:  1. Name of Medication: Bempedoic Acid-Ezetimibe (NEXLIZET) 180-10 MG TABS  2. How are you currently taking this medication (dosage and times per day)? Take 180 mg by mouth daily.  3. Are you having a reaction (difficulty breathing--STAT)?   4. What is your medication issue? Pt said he got approved for Nexlizit, [facility] gave him approval letter and he also received a letter from [employer] on 03/01. When he went to pick up his medication he was told to pay $100. He wanted to know where he can get the Nexlizit without paying that much

## 2020-06-28 NOTE — Progress Notes (Signed)
EPIC Encounter for ICM Monitoring  Patient Name: Dave Daniels is a 80 y.o. male Date: 06/28/2020 Primary Care Physican: Cheron Schaumann., MD Primary Cardiologist:Crenshaw Electrophysiologist: [name] 06/28/2020 Weight:158- 160 lbs  Bi-V Pacing: >99%  AT/AFBurden 9.1%(taking Eliquis)  Spoke with patient and reports feeling well at this time.  Denies fluid symptoms.    Corvue thoracic impedancenormal but was suggesting possible fluid accumulation from 3/1 - 3/9.  Prescribed: Furosemide 40 mg 1 tablet (40 mg total) daily.  Potassium 20 mEq 1 tablet daily.  Labs: 06/13/2019 Creatinine 1.90, BUN 4.5, Potassium 4.6, Sodium 142 A complete set of results can be found in Results Review  Recommendations:No changes and encouraged to call if experiencing any fluid symptoms.  Follow-up plan: ICM clinic phone appointment on4/19/2022. 91 day device clinic remote transmission4/13/2022.   EP/Cardiology Office Visits:Recall for 2/1/2022with Dr. Johney Frame.  Copy of ICM check sent to Dr.[name].  3 month ICM trend: 06/24/2020.    1 Year ICM trend:       Karie Soda, RN 06/28/2020 12:47 PM

## 2020-07-01 NOTE — Progress Notes (Signed)
   SUBJECTIVE Patient presents to office today with a new complaint regarding pain and tenderness to the left great toe.  He states that his toes having some numbness with tingling.  He is concerned because his toenail is growing over his toe.  It is very sensitive and tender to touch.  He presents for further treatment evaluation  Past Medical History:  Diagnosis Date  . CAD (coronary artery disease)    MI in [place] with stenting in 2010, then MI with CABG in [facility] 06/2010.  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  Marland Kitchen Complete heart block (HCC)   . Gout   . Hyperlipidemia   . Hypertension   . Ischemic cardiomyopathy    EF 15% by echo 11/2010 and still 20-25% by follow-up echo 02/2011, s/p St. Jude Bi-V ICD implantation 04/01/11  . LBBB (left bundle branch block)   . Renal insufficiency    Cr 1.6 on 03/25/11    OBJECTIVE General Patient is awake, alert, and oriented x 3 and in no acute distress. Derm Skin is dry and supple bilateral. Negative open lesions or macerations. Remaining integument unremarkable. Nails are tender, long, thickened and dystrophic with subungual debris, consistent with onychomycosis, left hallux. No signs of infection noted. Vasc  DP and PT pedal pulses palpable bilaterally. Temperature gradient within normal limits.  Neuro Epicritic and protective threshold sensation grossly intact bilaterally.  Musculoskeletal Exam No symptomatic pedal deformities noted bilateral. Muscular strength within normal limits.  ASSESSMENT 1.  Symptomatic dystrophic nail left hallux nail plate  PLAN OF CARE 1. Patient evaluated today.  2. Instructed to maintain good pedal hygiene and foot care.  3. Mechanical debridement of the left hallux nail plate was performed using a nail nipper. Filed with dremel without incident.  Patient felt immediate relief 4. Return to clinic in as needed   Felecia Shelling, DPM [facility]  Dr. Felecia Shelling, DPM    [address] [place], [place] [address]                Office [phone]  Fax [phone]

## 2020-07-10 ENCOUNTER — Other Ambulatory Visit: Payer: Self-pay

## 2020-07-10 ENCOUNTER — Encounter: Payer: Self-pay | Admitting: Student

## 2020-07-10 ENCOUNTER — Ambulatory Visit: Payer: Medicare HMO | Admitting: Student

## 2020-07-10 VITALS — BP 110/60 | HR 60 | Ht 67.0 in | Wt 163.0 lb

## 2020-07-10 DIAGNOSIS — I2583 Coronary atherosclerosis due to lipid rich plaque: Secondary | ICD-10-CM

## 2020-07-10 DIAGNOSIS — I4819 Other persistent atrial fibrillation: Secondary | ICD-10-CM

## 2020-07-10 DIAGNOSIS — I5022 Chronic systolic (congestive) heart failure: Secondary | ICD-10-CM | POA: Diagnosis not present

## 2020-07-10 DIAGNOSIS — I255 Ischemic cardiomyopathy: Secondary | ICD-10-CM | POA: Diagnosis not present

## 2020-07-10 DIAGNOSIS — I10 Essential (primary) hypertension: Secondary | ICD-10-CM

## 2020-07-10 DIAGNOSIS — I2510 Atherosclerotic heart disease of native coronary artery without angina pectoris: Secondary | ICD-10-CM

## 2020-07-10 LAB — CUP PACEART INCLINIC DEVICE CHECK
Battery Remaining Longevity: 20 mo
Brady Statistic RA Percent Paced: 51 %
Brady Statistic RV Percent Paced: 99.52 %
Date Time Interrogation Session: 20220330142604
HighPow Impedance: 69.75 Ohm
Implantable Lead Implant Date: 20121219
Implantable Lead Implant Date: 20121219
Implantable Lead Implant Date: 20121219
Implantable Lead Location: 753858
Implantable Lead Location: 753859
Implantable Lead Location: 753860
Implantable Pulse Generator Implant Date: 20170508
Lead Channel Impedance Value: 412.5 Ohm
Lead Channel Impedance Value: 487.5 Ohm
Lead Channel Impedance Value: 762.5 Ohm
Lead Channel Pacing Threshold Amplitude: 0.625 V
Lead Channel Pacing Threshold Amplitude: 0.75 V
Lead Channel Pacing Threshold Amplitude: 2.5 V
Lead Channel Pacing Threshold Pulse Width: 0.5 ms
Lead Channel Pacing Threshold Pulse Width: 0.5 ms
Lead Channel Pacing Threshold Pulse Width: 0.6 ms
Lead Channel Sensing Intrinsic Amplitude: 12 mV
Lead Channel Sensing Intrinsic Amplitude: 4.9 mV
Lead Channel Setting Pacing Amplitude: 2 V
Lead Channel Setting Pacing Amplitude: 2 V
Lead Channel Setting Pacing Amplitude: 3 V
Lead Channel Setting Pacing Pulse Width: 0.5 ms
Lead Channel Setting Pacing Pulse Width: 0.6 ms
Lead Channel Setting Sensing Sensitivity: 2 mV
Pulse Gen Serial Number: 7306740

## 2020-07-10 NOTE — Patient Instructions (Signed)
Medication Instructions:  Your physician recommends that you continue on your current medications as directed. Please refer to the Current Medication list given to you today.  *If you need a refill on your cardiac medications before your next appointment, please call your pharmacy*   Lab Work: None If you have labs (blood work) drawn today and your tests are completely normal, you will receive your results only by: Marland Kitchen MyChart Message (if you have MyChart) OR . A paper copy in the mail If you have any lab test that is abnormal or we need to change your treatment, we will call you to review the results.   Follow-Up: At [facility], you and your health needs are our priority.  As part of our continuing mission to provide you with exceptional heart care, we have created designated Provider Care Teams.  These Care Teams include your primary Cardiologist (physician) and Advanced Practice Providers (APPs -  Physician Assistants and Nurse Practitioners) who all work together to provide you with the care you need, when you need it.  Your next appointment:   1 year(s)  The format for your next appointment:   In Person  Provider:   You will see one of the following Advanced Practice Providers on your designated Care Team:    Gypsy Balsam, NP  Francis Dowse, PA-C  Casimiro Needle "[place]" [place], [place]

## 2020-07-10 NOTE — Progress Notes (Signed)
Electrophysiology Office Note Date: 07/10/2020  ID:  Dave Daniels, DOB 1940/05/01, MRN [id number]  PCP: Cheron Schaumann., MD Primary Cardiologist: No primary care provider on file. Electrophysiologist: Hillis Range, MD   CC: Routine ICD follow-up  Dave Daniels is a 80 y.o. male seen today for Hillis Range, MD for routine electrophysiology followup.  Since last being seen in our clinic the patient reports doing very well.  he denies chest pain, palpitations, dyspnea, PND, orthopnea, nausea, vomiting, dizziness, syncope, edema, weight gain, or early satiety. He has not had ICD shocks.   Device History: St. Jude BiV ICD implanted 08/19/2015 for HF   Past Medical History:  Diagnosis Date  . CAD (coronary artery disease)    MI in [place] with stenting in 2010, then MI with CABG in [facility] 06/2010.  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  Marland Kitchen Complete heart block (HCC)   . Gout   . Hyperlipidemia   . Hypertension   . Ischemic cardiomyopathy    EF 15% by echo 11/2010 and still 20-25% by follow-up echo 02/2011, s/p St. Jude Bi-V ICD implantation 04/01/11  . LBBB (left bundle branch block)   . Renal insufficiency    Cr 1.6 on 03/25/11   Past Surgical History:  Procedure Laterality Date  . BI-VENTRICULAR IMPLANTABLE CARDIOVERTER DEFIBRILLATOR N/A 04/01/2011   Procedure: BI-VENTRICULAR IMPLANTABLE CARDIOVERTER DEFIBRILLATOR  (CRT-D);  Surgeon: Marinus Maw, MD;  Location: [facility] CATH LAB;  Service: Cardiovascular;  Laterality: N/A;  . CARDIAC DEFIBRILLATOR PLACEMENT  12/12   BiV ICD (SJM) implanted by Dr Johney Frame  . CARDIOVERSION N/A 04/22/2018   Procedure: CARDIOVERSION;  Surgeon: Quintella Reichert, MD;  Location: [facility] ENDOSCOPY;  Service: Cardiovascular;  Laterality: N/A;  . CARDIOVERSION N/A 06/13/2019   Procedure: CARDIOVERSION;  Surgeon: Quintella Reichert, MD;  Location: [facility] ENDOSCOPY;  Service: Cardiovascular;  Laterality: N/A;  . Carpel tunnel surgery    . CORONARY ARTERY BYPASS GRAFT  3/12    in [place]  . EP IMPLANTABLE DEVICE N/A 08/19/2015   Procedure: BIV ICD Generator Changeout;  Surgeon: Hillis Range, MD;  Location: [facility] INVASIVE CV LAB;  Service: Cardiovascular;  Laterality: N/A;  . INGUINAL HERNIA REPAIR    . TONSILLECTOMY    . UMBILICAL HERNIA REPAIR      Current Outpatient Medications  Medication Sig Dispense Refill  . acetaminophen (TYLENOL) 500 MG tablet Take 1,000 mg by mouth as needed for moderate pain or headache.     . allopurinol (ZYLOPRIM) 300 MG tablet Take 300 mg by mouth every evening.   11  . apixaban (ELIQUIS) 5 MG TABS tablet Take 1 tablet (5 mg total) by mouth 2 (two) times daily. 28 tablet 0  . Bempedoic Acid-Ezetimibe (NEXLIZET) 180-10 MG TABS Take 180 mg by mouth daily. 30 tablet 6  . carvedilol (COREG) 12.5 MG tablet TAKE 1 TABLET BY MOUTH TWICE DAILY WITH MEALS 180 tablet 2  . cholecalciferol (VITAMIN D3) 25 MCG (1000 UNIT) tablet Take 1,000 Units by mouth daily.    . colchicine 0.6 MG tablet Take 0.3 mg by mouth every evening.    . finasteride (PROSCAR) 5 MG tablet Take 5 mg by mouth daily.    . furosemide (LASIX) 40 MG tablet TAKE 1 TABLET BY MOUTH ONCE DAILY 90 tablet 2  . meloxicam (MOBIC) 15 MG tablet Take 15 mg by mouth as needed for pain. For gout flare ups    . potassium chloride SA (K-DUR,KLOR-CON) 20 MEQ tablet Take 1 tablet (20 mEq  total) by mouth daily. 30 tablet 11  . sacubitril-valsartan (ENTRESTO) 24-26 MG Take 1 tablet by mouth 2 (two) times daily. 60 tablet 12   No current facility-administered medications for this visit.    Allergies:   Statins, Nsaids, Tolmetin, Atorvastatin, and Pravastatin   Social History: Social History   Socioeconomic History  . Marital status: Married    Spouse name: Not on file  . Number of children: 4  . Years of education: Not on file  . Highest education level: Not on file  Occupational History    Comment: Retired  Tobacco Use  . Smoking status: Never Smoker  . Smokeless tobacco: Never Used   Vaping Use  . Vaping Use: Never used  Substance and Sexual Activity  . Alcohol use: Yes    Alcohol/week: 2.0 standard drinks    Types: 2 Glasses of wine per week    Comment: occasional  . Drug use: No  . Sexual activity: Yes  Other Topics Concern  . Not on file  Social History Narrative   Lives in [place], recently moved from [place].  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   Social Determinants of Corporate investment banker Strain: Not on file  Food Insecurity: Not on file  Transportation Needs: Not on file  Physical Activity: Not on file  Stress: Not on file  Social Connections: Not on file  Intimate Partner Violence: Not on file    Family History: Family History  Problem Relation Age of Onset  . Pancreatic cancer Mother     Review of Systems: All other systems reviewed and are otherwise negative except as noted above.   Physical Exam: Vitals:   07/10/20 1235  BP: 110/60  Pulse: 60  SpO2: 99%  Weight: 163 lb (73.9 kg)  Height: 5\' 7"  (1.702 m)     GEN- The patient is well appearing, alert and oriented x 3 today.   HEENT: normocephalic, atraumatic; sclera clear, conjunctiva pink; hearing intact; oropharynx clear; neck supple, no JVP Lymph- no cervical lymphadenopathy Lungs- Clear to ausculation bilaterally, normal work of breathing.  No wheezes, rales, rhonchi Heart- Regular rate and rhythm, no murmurs, rubs or gallops, PMI not laterally displaced GI- soft, non-tender, non-distended, bowel sounds present, no hepatosplenomegaly Extremities- no clubbing or cyanosis. No edema; DP/PT/radial pulses 2+ bilaterally MS- no significant deformity or atrophy Skin- warm and dry, no rash or lesion; ICD pocket well healed Psych- euthymic mood, full affect Neuro- strength and sensation are intact  ICD interrogation- reviewed in detail today,  See PACEART report  EKG:  EKG is not ordered today. The ekg ordered 05/08/20 shows BiV pacing at 60 bpm with initial downward deflection in  lead 1, RSR in V1, QRS 176 ms  Recent Labs: 05/17/2020: Hemoglobin 14.6; Platelets 236 06/25/2020: ALT 11; BUN 45; Creatinine, Ser 2.20; Potassium 5.0; Sodium 144   Wt Readings from Last 3 Encounters:  07/10/20 163 lb (73.9 kg)  06/11/20 168 lb 6.4 oz (76.4 kg)  05/08/20 158 lb (71.7 kg)     Other studies Reviewed: Additional studies/ records that were reviewed today include: Previous EP and AF office notes.   Assessment and Plan:  1.  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 He denies any limitations. euvolemic today Stable on an appropriate medical regimen Normal ICD function See Pace Art report No changes today  2. Persistent AF Doing well without AAD CHA2DS2VASC of at least 5. on Eliquis  3. HTN Stable on current medications  Current medicines are reviewed at length with the patient today.   The patient does not have concerns regarding his medicines.  The following changes were made today:  none  Labs/ tests ordered today include:  No orders of the defined types were placed in this encounter.   Disposition:   Follow up with Dr. Johney Frame or EP APP in 12 months. Sooner if needed. Continue q 3 month remotes.  Dustin Flock, PA-C  07/10/2020 2:23 PM  [facility] [address] [place] [address] [phone] (office) [phone] (fax)

## 2020-07-16 LAB — LIPID PANEL
Chol/HDL Ratio: 3.2 ratio (ref 0.0–5.0)
Cholesterol, Total: 150 mg/dL (ref 100–199)
HDL: 47 mg/dL (ref >39)
LDL Chol Calc (NIH): 90 mg/dL (ref 0–99)
Triglycerides: 63 mg/dL (ref 0–149)
VLDL Cholesterol Cal: 13 mg/dL (ref 5–40)

## 2020-07-16 LAB — HEPATIC FUNCTION PANEL
ALT: 12 IU/L (ref 0–44)
AST: 17 IU/L (ref 0–40)
Albumin: 4.4 g/dL (ref 3.7–4.7)
Alkaline Phosphatase: 77 IU/L (ref 44–121)
Bilirubin Total: 0.6 mg/dL (ref 0.0–1.2)
Bilirubin, Direct: 0.22 mg/dL (ref 0.00–0.40)
Total Protein: 7.1 g/dL (ref 6.0–8.5)

## 2020-07-24 ENCOUNTER — Ambulatory Visit (INDEPENDENT_AMBULATORY_CARE_PROVIDER_SITE_OTHER): Payer: Medicare HMO

## 2020-07-24 DIAGNOSIS — I5022 Chronic systolic (congestive) heart failure: Secondary | ICD-10-CM

## 2020-07-24 DIAGNOSIS — I255 Ischemic cardiomyopathy: Secondary | ICD-10-CM

## 2020-07-25 LAB — CUP PACEART REMOTE DEVICE CHECK
Battery Remaining Longevity: 19 mo
Battery Remaining Percentage: 28 %
Battery Voltage: 2.84 V
Brady Statistic AP VP Percent: 75 %
Brady Statistic AP VS Percent: 1 %
Brady Statistic AS VP Percent: 24 %
Brady Statistic AS VS Percent: 1 %
Brady Statistic RA Percent Paced: 75 %
Date Time Interrogation Session: 20220413151633
HighPow Impedance: 61 Ohm
HighPow Impedance: 61 Ohm
Implantable Lead Implant Date: 20121219
Implantable Lead Implant Date: 20121219
Implantable Lead Implant Date: 20121219
Implantable Lead Location: 753858
Implantable Lead Location: 753859
Implantable Lead Location: 753860
Implantable Pulse Generator Implant Date: 20170508
Lead Channel Impedance Value: 340 Ohm
Lead Channel Impedance Value: 440 Ohm
Lead Channel Impedance Value: 660 Ohm
Lead Channel Pacing Threshold Amplitude: 0.625 V
Lead Channel Pacing Threshold Amplitude: 0.75 V
Lead Channel Pacing Threshold Amplitude: 2.5 V
Lead Channel Pacing Threshold Pulse Width: 0.5 ms
Lead Channel Pacing Threshold Pulse Width: 0.5 ms
Lead Channel Pacing Threshold Pulse Width: 0.6 ms
Lead Channel Sensing Intrinsic Amplitude: 12 mV
Lead Channel Sensing Intrinsic Amplitude: 3.5 mV
Lead Channel Setting Pacing Amplitude: 2 V
Lead Channel Setting Pacing Amplitude: 2 V
Lead Channel Setting Pacing Amplitude: 3 V
Lead Channel Setting Pacing Pulse Width: 0.5 ms
Lead Channel Setting Pacing Pulse Width: 0.6 ms
Lead Channel Setting Sensing Sensitivity: 2 mV
Pulse Gen Serial Number: 7306740

## 2020-07-30 ENCOUNTER — Telehealth: Payer: Self-pay

## 2020-07-30 ENCOUNTER — Ambulatory Visit (INDEPENDENT_AMBULATORY_CARE_PROVIDER_SITE_OTHER): Payer: Medicare HMO

## 2020-07-30 DIAGNOSIS — I5022 Chronic systolic (congestive) heart failure: Secondary | ICD-10-CM | POA: Diagnosis not present

## 2020-07-30 DIAGNOSIS — Z95810 Presence of automatic (implantable) cardiac defibrillator: Secondary | ICD-10-CM | POA: Diagnosis not present

## 2020-07-30 NOTE — Progress Notes (Signed)
EPIC Encounter for ICM Monitoring  Patient Name: Dave Daniels is a 80 y.o. male Date: 07/30/2020 Primary Care Physican: Cheron Schaumann., MD Primary Cardiologist:Crenshaw Electrophysiologist: [name] 06/28/2020 Weight:158- 160 lbs  Bi-V Pacing: >99%  AT/AFBurden 0%(taking Eliquis)  Attempted call to patient and unable to reach.  Transmission reviewed.   Corvue thoracic impedancenormal but was suggestingpossible fluid accumulation starting 07/26/2020 but trending back toward baseline.  Prescribed: Furosemide 40 mg 1 tablet (40 mg total) daily.  Potassium 20 mEq 1 tablet daily.  Labs: 06/13/2019 Creatinine 1.90, BUN 4.5, Potassium 4.6, Sodium 142 A complete set of results can be found in Results Review  Recommendations:Unable to reach.    Follow-up plan: ICM clinic phone appointment on4/27/2022 to recheck fluid levels. 91 day device clinic remote transmission7/13/2022.   EP/Cardiology Office Visits: Recall 05/03/2021 with Dr Jens Som.  Recall for 3/25/202 with Otilio Saber, PA.    Copy of ICM check sent to Dr.[name] and Dr Jens Som.  3 month ICM trend: 07/30/2020.    1 Year ICM trend:       Karie Soda, RN 07/30/2020 3:29 PM

## 2020-07-30 NOTE — Telephone Encounter (Signed)
Remote ICM transmission received.  Attempted call to patient regarding ICM remote transmission and recording stated mail box is full.

## 2020-08-06 ENCOUNTER — Ambulatory Visit (INDEPENDENT_AMBULATORY_CARE_PROVIDER_SITE_OTHER): Payer: Medicare HMO

## 2020-08-06 ENCOUNTER — Telehealth: Payer: Self-pay

## 2020-08-06 DIAGNOSIS — Z95810 Presence of automatic (implantable) cardiac defibrillator: Secondary | ICD-10-CM

## 2020-08-06 DIAGNOSIS — I5022 Chronic systolic (congestive) heart failure: Secondary | ICD-10-CM

## 2020-08-06 NOTE — Progress Notes (Signed)
EPIC Encounter for ICM Monitoring  Patient Name: Dave Daniels is a 80 y.o. male Date: 08/06/2020 Primary Care Physican: Cheron Schaumann., MD Primary Cardiologist:Crenshaw Electrophysiologist: Johney Frame 08/06/2020 Weight:158- 160lbs  Bi-V Pacing: >99%  AT/AFBurden 0%(taking Eliquis)  Spoke with patient.  He reports tripping and falling on the side of his device 3 days ago and having some soreness in the device area.  No swelling, redness or other symptoms.  He was worried the device may not be functioning properly after fall but advised no abnormal episodes listed on report.  He has missed several Furosemide dosages of the last 10 days which may contribute to decreased impedance.   Corvue thoracic impedancenormal but wassuggestingpossible fluid accumulation starting 08/04/2020 but trending back toward baseline.  Prescribed: Furosemide 40 mg 1 tablet (40 mg total) daily.  Potassium 20 mEq 1 tablet daily.  Labs: 06/13/2019 Creatinine 1.90, BUN 4.5, Potassium 4.6, Sodium 142 A complete set of results can be found in Results Review  Recommendations:Advised to take all meds as prescribed and do not skip dosages since this may increase risk for hospitalization.    Follow-up plan: ICM clinic phone appointment on5/07/2020 to recheck fluid levels. 91 day device clinic remote transmission7/13/2022.   EP/Cardiology Office Visits: Recall 05/03/2021 with Dr Jens Som.  Recall for 3/25/202 with Otilio Saber, PA.    Copy of ICM check sent to Dr.[name] and Dr Jens Som.  3 month ICM trend: 08/06/2020.    1 Year ICM trend:       Karie Soda, RN 08/06/2020 10:28 AM

## 2020-08-06 NOTE — Telephone Encounter (Signed)
Call back to patient.  Transmission reviewed and advised no abnormal episodes listed.  Appears device is functioning normally.  Advised to call the office back if develops any changes around the device area such as pain, redness, swelling or changes in the skin area.  He thinks the soreness is just muscle pain due to the fall.  Advised to use ER if needed.

## 2020-08-06 NOTE — Telephone Encounter (Signed)
Spoke with patient.  He reported tripping over something while working in the yard 3 days ago and took a hard fall on his left side. He complains of pain located in the chest area where the device is located.  He will send remote transmission for review to make sure the device is working properly and no damage occurred during the fall.  Will review and call back.

## 2020-08-08 NOTE — Progress Notes (Signed)
Remote ICD transmission.   

## 2020-08-14 ENCOUNTER — Ambulatory Visit (INDEPENDENT_AMBULATORY_CARE_PROVIDER_SITE_OTHER): Payer: Medicare HMO

## 2020-08-14 DIAGNOSIS — Z95810 Presence of automatic (implantable) cardiac defibrillator: Secondary | ICD-10-CM

## 2020-08-14 DIAGNOSIS — I5022 Chronic systolic (congestive) heart failure: Secondary | ICD-10-CM

## 2020-08-14 NOTE — Progress Notes (Signed)
EPIC Encounter for ICM Monitoring  Patient Name: Dave Daniels is a 80 y.o. male Date: 08/14/2020 Primary Care Physican: Cheron Schaumann., MD Primary Cardiologist:Crenshaw Electrophysiologist: Johney Frame 08/06/2020 Weight:158- 160lbs  Bi-V Pacing: >99%  AT/AFBurden0%(taking Eliquis)  Spoke with patient and reports feeling well at this time.  Denies fluid symptoms.    Corvue thoracic impedancesuggesting fluid levels returned normal.   Prescribed: Furosemide 40 mg 1 tablet (40 mg total) daily.  Potassium 20 mEq 1 tablet daily.  Labs: 06/13/2019 Creatinine 1.90, BUN 4.5, Potassium 4.6, Sodium 142 A complete set of results can be found in Results Review  Recommendations: No changes and encouraged to call if experiencing any fluid symptoms.  Follow-up plan: ICM clinic phone appointment on6/13/2022. 91 day device clinic remote transmission7/13/2022.   EP/Cardiology Office Visits: Recall 05/03/2021 with Dr Jens Som.Recall for3/25/202 with Otilio Saber, PA.  Copy of ICM check sent to Dr.Allredand Dr Jens Som.   3 month ICM trend: 08/14/2020.    1 Year ICM trend:       Karie Soda, RN 08/14/2020 3:51 PM

## 2020-09-11 ENCOUNTER — Other Ambulatory Visit: Payer: Self-pay | Admitting: *Deleted

## 2020-09-11 DIAGNOSIS — E7800 Pure hypercholesterolemia, unspecified: Secondary | ICD-10-CM

## 2020-09-12 LAB — HEPATIC FUNCTION PANEL
ALT: 7 IU/L (ref 0–44)
AST: 9 IU/L (ref 0–40)
Albumin: 4.7 g/dL (ref 3.7–4.7)
Alkaline Phosphatase: 78 IU/L (ref 44–121)
Bilirubin Total: 0.6 mg/dL (ref 0.0–1.2)
Bilirubin, Direct: 0.22 mg/dL (ref 0.00–0.40)
Total Protein: 7.2 g/dL (ref 6.0–8.5)

## 2020-09-12 LAB — LIPID PANEL
Chol/HDL Ratio: 3.5 ratio (ref 0.0–5.0)
Cholesterol, Total: 160 mg/dL (ref 100–199)
HDL: 46 mg/dL (ref >39)
LDL Chol Calc (NIH): 98 mg/dL (ref 0–99)
Triglycerides: 86 mg/dL (ref 0–149)
VLDL Cholesterol Cal: 16 mg/dL (ref 5–40)

## 2020-09-23 ENCOUNTER — Ambulatory Visit (INDEPENDENT_AMBULATORY_CARE_PROVIDER_SITE_OTHER): Payer: Medicare HMO

## 2020-09-23 DIAGNOSIS — I5022 Chronic systolic (congestive) heart failure: Secondary | ICD-10-CM

## 2020-09-23 DIAGNOSIS — Z95810 Presence of automatic (implantable) cardiac defibrillator: Secondary | ICD-10-CM | POA: Diagnosis not present

## 2020-09-25 ENCOUNTER — Telehealth: Payer: Self-pay

## 2020-09-25 NOTE — Progress Notes (Signed)
EPIC Encounter for ICM Monitoring  Patient Name: Dave Daniels is a 80 y.o. male Date: 09/25/2020 Primary Care Physican: Cheron Schaumann., MD Primary Cardiologist: Jens Som Electrophysiologist: [name] 08/06/2020 Weight: 158 - 160 lbs Bi-V Pacing: >99%          AT/AF Burden 0% (taking Eliquis)                                  Attempted call to patient and unable to reach.  Transmission reviewed.     Corvue thoracic impedance suggesting fluid levels returned normal.     Prescribed:  Furosemide 40 mg 1 tablet (40 mg total) daily.   Potassium 20 mEq 1 tablet daily.   Labs: 06/13/2019 Creatinine 1.90, BUN 4.5, Potassium 4.6, Sodium 142 A complete set of results can be found in Results Review   Recommendations:  Unable to reach.     Follow-up plan: ICM clinic phone appointment on 10/28/2020.   91 day device clinic remote transmission 10/23/2020.     EP/Cardiology Office Visits:  Recall 05/03/2021 with Dr Jens Som.   Recall for 3/25/202 with Otilio Saber, PA.     Copy of ICM check sent to Dr. Johney Frame .   3 month ICM trend: 09/23/2020.    1 Year ICM trend:       Karie Soda, RN 09/25/2020 9:01 AM

## 2020-09-25 NOTE — Telephone Encounter (Signed)
Remote ICM transmission received.  Attempted call to patient regarding ICM remote transmission and mail box is full. 

## 2020-10-23 ENCOUNTER — Ambulatory Visit (INDEPENDENT_AMBULATORY_CARE_PROVIDER_SITE_OTHER): Payer: Medicare HMO

## 2020-10-23 DIAGNOSIS — I5022 Chronic systolic (congestive) heart failure: Secondary | ICD-10-CM

## 2020-10-23 DIAGNOSIS — I255 Ischemic cardiomyopathy: Secondary | ICD-10-CM

## 2020-10-23 LAB — CUP PACEART REMOTE DEVICE CHECK
Battery Remaining Longevity: 17 mo
Battery Remaining Percentage: 25 %
Battery Voltage: 2.81 V
Brady Statistic AP VP Percent: 77 %
Brady Statistic AP VS Percent: 1 %
Brady Statistic AS VP Percent: 22 %
Brady Statistic AS VS Percent: 1 %
Brady Statistic RA Percent Paced: 77 %
Date Time Interrogation Session: 20220713151635
HighPow Impedance: 66 Ohm
HighPow Impedance: 66 Ohm
Implantable Lead Implant Date: 20121219
Implantable Lead Implant Date: 20121219
Implantable Lead Implant Date: 20121219
Implantable Lead Location: 753858
Implantable Lead Location: 753859
Implantable Lead Location: 753860
Implantable Pulse Generator Implant Date: 20170508
Lead Channel Impedance Value: 360 Ohm
Lead Channel Impedance Value: 450 Ohm
Lead Channel Impedance Value: 680 Ohm
Lead Channel Pacing Threshold Amplitude: 0.625 V
Lead Channel Pacing Threshold Amplitude: 0.75 V
Lead Channel Pacing Threshold Amplitude: 2.25 V
Lead Channel Pacing Threshold Pulse Width: 0.5 ms
Lead Channel Pacing Threshold Pulse Width: 0.5 ms
Lead Channel Pacing Threshold Pulse Width: 0.6 ms
Lead Channel Sensing Intrinsic Amplitude: 12 mV
Lead Channel Sensing Intrinsic Amplitude: 3.4 mV
Lead Channel Setting Pacing Amplitude: 2 V
Lead Channel Setting Pacing Amplitude: 2 V
Lead Channel Setting Pacing Amplitude: 2.75 V
Lead Channel Setting Pacing Pulse Width: 0.5 ms
Lead Channel Setting Pacing Pulse Width: 0.6 ms
Lead Channel Setting Sensing Sensitivity: 2 mV
Pulse Gen Serial Number: 7306740

## 2020-10-28 ENCOUNTER — Ambulatory Visit (INDEPENDENT_AMBULATORY_CARE_PROVIDER_SITE_OTHER): Payer: Medicare HMO

## 2020-10-28 DIAGNOSIS — Z95810 Presence of automatic (implantable) cardiac defibrillator: Secondary | ICD-10-CM | POA: Diagnosis not present

## 2020-10-28 DIAGNOSIS — I5022 Chronic systolic (congestive) heart failure: Secondary | ICD-10-CM

## 2020-10-30 ENCOUNTER — Telehealth: Payer: Self-pay

## 2020-10-30 NOTE — Progress Notes (Signed)
EPIC Encounter for ICM Monitoring  Patient Name: Dave Daniels is a 80 y.o. male Date: 10/30/2020 Primary Care Physican: Cheron Schaumann., MD Primary Cardiologist: Jens Som Electrophysiologist: [name] 08/06/2020 Weight: 158 - 160 lbs Bi-V Pacing: >99%          AT/AF Burden 0% (taking Eliquis)                                  Attempted call to patient and unable to reach.  Transmission reviewed.     Corvue thoracic impedance suggesting normal fluid levels.     Prescribed:  Furosemide 40 mg 1 tablet (40 mg total) daily.   Potassium 20 mEq 1 tablet daily.   Labs: 06/25/2020 Creatinine 2.20, BUN 45, Potassium 5.0, Sodium 144, GFR 30 A complete set of results can be found in Results Review   Recommendations:  Unable to reach.     Follow-up plan: ICM clinic phone appointment on 12/02/2020.   91 day device clinic remote transmission 01/22/2021.     EP/Cardiology Office Visits:  Recall 05/03/2021 with Dr Jens Som.   Recall for 07/05/2020 with Otilio Saber, PA.     Copy of ICM check sent to Dr. Johney Frame.   3 month ICM trend: 10/28/2020.    1 Year ICM trend:       Karie Soda, RN 10/30/2020 3:16 PM

## 2020-10-30 NOTE — Telephone Encounter (Signed)
Remote ICM transmission received.  Attempted call to patient regarding ICM remote transmission and mail box is full. 

## 2020-11-15 NOTE — Progress Notes (Signed)
Remote ICD transmission.   

## 2020-12-02 ENCOUNTER — Other Ambulatory Visit: Payer: Self-pay | Admitting: Internal Medicine

## 2020-12-02 ENCOUNTER — Ambulatory Visit (INDEPENDENT_AMBULATORY_CARE_PROVIDER_SITE_OTHER): Payer: Medicare HMO

## 2020-12-02 DIAGNOSIS — I5022 Chronic systolic (congestive) heart failure: Secondary | ICD-10-CM | POA: Diagnosis not present

## 2020-12-02 DIAGNOSIS — Z95810 Presence of automatic (implantable) cardiac defibrillator: Secondary | ICD-10-CM | POA: Diagnosis not present

## 2020-12-02 NOTE — Progress Notes (Signed)
EPIC Encounter for ICM Monitoring  Patient Name: Dave Daniels is a 80 y.o. male Date: 12/02/2020 Primary Care Physican: Cheron Schaumann., MD Primary Cardiologist: Jens Som Electrophysiologist: [name] 08/06/2020 Weight: 158 - 160 lbs Bi-V Pacing: >99%          AT/AF Burden 0% (taking Eliquis)                                  Attempted call to patient and unable to reach.  Transmission reviewed.     Corvue thoracic impedance suggesting possible fluid accumulation starting 12/01/2020 and 8/17-8/20.     Prescribed:  Furosemide 40 mg 1 tablet (40 mg total) daily.   Potassium 20 mEq 1 tablet daily.   Labs: 06/25/2020 Creatinine 2.20, BUN 45, Potassium 5.0, Sodium 144, GFR 30 A complete set of results can be found in Results Review   Recommendations:  Unable to reach.     Follow-up plan: ICM clinic phone appointment on 12/10/2020 to recheck fluid levels.   91 day device clinic remote transmission 01/22/2021.     EP/Cardiology Office Visits:  Recall 05/03/2021 with Dr Jens Som.   Recall for 07/05/2020 with Otilio Saber, PA.     Copy of ICM check sent to Dr. Johney Frame.    3 month ICM trend: 12/02/2020.    1 Year ICM trend:       Karie Soda, RN 12/02/2020 5:09 PM

## 2020-12-03 ENCOUNTER — Telehealth: Payer: Self-pay

## 2020-12-03 NOTE — Telephone Encounter (Signed)
Remote ICM transmission received.  Attempted call to patient regarding ICM remote transmission and mail box is full. 

## 2020-12-10 ENCOUNTER — Ambulatory Visit (INDEPENDENT_AMBULATORY_CARE_PROVIDER_SITE_OTHER): Payer: Medicare HMO

## 2020-12-10 DIAGNOSIS — Z95810 Presence of automatic (implantable) cardiac defibrillator: Secondary | ICD-10-CM

## 2020-12-10 DIAGNOSIS — I5022 Chronic systolic (congestive) heart failure: Secondary | ICD-10-CM

## 2020-12-13 NOTE — Progress Notes (Signed)
EPIC Encounter for ICM Monitoring  Patient Name: Dave Daniels is a 80 y.o. male Date: 12/13/2020 Primary Care Physican: Cheron Schaumann., MD Primary Cardiologist: Jens Som Electrophysiologist: [name] Nephrologist: [facility] Nephrology [facility] 10/30/2020 Office Weight: 166 lbs Bi-V Pacing: >99%          AT/AF Burden 0% (taking Eliquis)                                  Transmission reviewed.      Corvue thoracic impedance suggesting fluid levels returned to normal.     Prescribed:  Furosemide 40 mg 1 tablet (40 mg total) daily.   Potassium 20 mEq 1 tablet daily.   Labs: 10/28/2020 Creatinine 2.64, BUN 48, Potassium 5.1, Sodium 143, GFR 24 06/25/2020 Creatinine 2.20, BUN 45, Potassium 5.0, Sodium 144, GFR 30 A complete set of results can be found in Results Review   Recommendations:  No changes   Follow-up plan: ICM clinic phone appointment on 01/13/2021.   91 day device clinic remote transmission 01/22/2021.     EP/Cardiology Office Visits:  Recall 05/03/2021 with Dr Jens Som.   Recall for 07/05/2020 with Otilio Saber, PA.     Copy of ICM check sent to Dr. Johney Frame.    3 month ICM trend: 12/10/2020.    1 Year ICM trend:       Karie Soda, RN 12/13/2020 8:26 AM

## 2021-01-08 ENCOUNTER — Other Ambulatory Visit: Payer: Self-pay | Admitting: Cardiology

## 2021-01-13 ENCOUNTER — Ambulatory Visit (INDEPENDENT_AMBULATORY_CARE_PROVIDER_SITE_OTHER): Payer: Medicare HMO

## 2021-01-13 DIAGNOSIS — I5022 Chronic systolic (congestive) heart failure: Secondary | ICD-10-CM | POA: Diagnosis not present

## 2021-01-13 DIAGNOSIS — Z95810 Presence of automatic (implantable) cardiac defibrillator: Secondary | ICD-10-CM | POA: Diagnosis not present

## 2021-01-15 ENCOUNTER — Telehealth: Payer: Self-pay

## 2021-01-15 NOTE — Progress Notes (Signed)
EPIC Encounter for ICM Monitoring  Patient Name: Dave Daniels is a 80 y.o. male Date: 01/15/2021 Primary Care Physican: Cheron Schaumann., MD Primary Cardiologist: Jens Som Electrophysiologist: [name] Nephrologist: [facility] 10/30/2020 Office Weight: 166 lbs Bi-V Pacing: >99%          AT/AF Burden 0% (taking Eliquis)                                  Attempted call to patient and unable to reach.   Transmission reviewed.    Corvue thoracic impedance suggesting fluid levels returned to normal.     Prescribed:  Furosemide 40 mg 1 tablet (40 mg total) daily.   Potassium 20 mEq 1 tablet daily.   Labs: 10/28/2020 Creatinine 2.64, BUN 48, Potassium 5.1, Sodium 143, GFR 24 06/25/2020 Creatinine 2.20, BUN 45, Potassium 5.0, Sodium 144, GFR 30 A complete set of results can be found in Results Review   Recommendations:  Unable to reach.     Follow-up plan: ICM clinic phone appointment on 02/17/2021.   91 day device clinic remote transmission 01/22/2021.     EP/Cardiology Office Visits:  Recall 05/03/2021 with Dr Jens Som.   Recall for 07/05/2020 with Otilio Saber, PA.     Copy of ICM check sent to Dr. Johney Frame.     3 month ICM trend: 01/13/2021.    1 Year ICM trend:       Karie Soda, RN 01/15/2021 3:25 PM

## 2021-01-15 NOTE — Telephone Encounter (Signed)
Remote ICM transmission received.  Attempted call to patient regarding ICM remote transmission and no answer and mail box full.

## 2021-01-22 ENCOUNTER — Ambulatory Visit (INDEPENDENT_AMBULATORY_CARE_PROVIDER_SITE_OTHER): Payer: Medicare HMO

## 2021-01-22 DIAGNOSIS — I5022 Chronic systolic (congestive) heart failure: Secondary | ICD-10-CM | POA: Diagnosis not present

## 2021-01-23 LAB — CUP PACEART REMOTE DEVICE CHECK
Battery Remaining Longevity: 14 mo
Battery Remaining Percentage: 22 %
Battery Voltage: 2.78 V
Brady Statistic AP VP Percent: 78 %
Brady Statistic AP VS Percent: 1 %
Brady Statistic AS VP Percent: 22 %
Brady Statistic AS VS Percent: 1 %
Brady Statistic RA Percent Paced: 77 %
Date Time Interrogation Session: 20221012152559
HighPow Impedance: 57 Ohm
HighPow Impedance: 57 Ohm
Implantable Lead Implant Date: 20121219
Implantable Lead Implant Date: 20121219
Implantable Lead Implant Date: 20121219
Implantable Lead Location: 753858
Implantable Lead Location: 753859
Implantable Lead Location: 753860
Implantable Pulse Generator Implant Date: 20170508
Lead Channel Impedance Value: 300 Ohm
Lead Channel Impedance Value: 410 Ohm
Lead Channel Impedance Value: 660 Ohm
Lead Channel Pacing Threshold Amplitude: 0.75 V
Lead Channel Pacing Threshold Amplitude: 0.875 V
Lead Channel Pacing Threshold Amplitude: 3.125 V
Lead Channel Pacing Threshold Pulse Width: 0.5 ms
Lead Channel Pacing Threshold Pulse Width: 0.5 ms
Lead Channel Pacing Threshold Pulse Width: 0.6 ms
Lead Channel Sensing Intrinsic Amplitude: 12 mV
Lead Channel Sensing Intrinsic Amplitude: 3.1 mV
Lead Channel Setting Pacing Amplitude: 2 V
Lead Channel Setting Pacing Amplitude: 2 V
Lead Channel Setting Pacing Amplitude: 3.625
Lead Channel Setting Pacing Pulse Width: 0.5 ms
Lead Channel Setting Pacing Pulse Width: 0.6 ms
Lead Channel Setting Sensing Sensitivity: 2 mV
Pulse Gen Serial Number: 7306740

## 2021-01-31 NOTE — Progress Notes (Signed)
Remote ICD transmission.   

## 2021-02-17 ENCOUNTER — Ambulatory Visit (INDEPENDENT_AMBULATORY_CARE_PROVIDER_SITE_OTHER): Payer: Medicare HMO

## 2021-02-17 ENCOUNTER — Telehealth: Payer: Self-pay | Admitting: Cardiology

## 2021-02-17 DIAGNOSIS — I5022 Chronic systolic (congestive) heart failure: Secondary | ICD-10-CM

## 2021-02-17 DIAGNOSIS — Z95810 Presence of automatic (implantable) cardiac defibrillator: Secondary | ICD-10-CM | POA: Diagnosis not present

## 2021-02-17 NOTE — Telephone Encounter (Signed)
Patient will be dropping off some paperwork on Wednesday for Debra.

## 2021-02-18 NOTE — Progress Notes (Signed)
EPIC Encounter for ICM Monitoring  Patient Name: Dave Daniels is a 80 y.o. male Date: 02/18/2021 Primary Care Physican: Cheron Schaumann., MD Primary Cardiologist: Jens Som Electrophysiologist: [name] Nephrologist: [facility]rology [facility] 02/18/2021 Weight: 162-163 lbs Bi-V Pacing: >99%          AT/AF Burden <1% (taking Eliquis)                                  Spoke with patient and heart failure questions reviewed.  Pt asymptomatic for fluid accumulation and feeling well.   Corvue thoracic impedance suggesting normal fluid levels     Prescribed:  Furosemide 40 mg 1 tablet (40 mg total) daily.   Potassium 20 mEq 1 tablet daily.   Labs: 12/13/2020 Creatinine 2.78, BUN 52, Potassium 4.6, Sodium 142, GFR 22 10/28/2020 Creatinine 2.64, BUN 48, Potassium 5.1, Sodium 143, GFR 24 06/25/2020 Creatinine 2.20, BUN 45, Potassium 5.0, Sodium 144, GFR 30 A complete set of results can be found in Results Review   Recommendations:  No changes and encouraged to call if experiencing any fluid symptoms.   Follow-up plan: ICM clinic phone appointment on 03/24/2021.   91 day device clinic remote transmission 04/23/2021.     EP/Cardiology Office Visits:  Recall 05/03/2021 with Dr Jens Som.   Recall for 07/05/2020 with Otilio Saber, PA.     Copy of ICM check sent to Dr. Johney Frame.      3 month ICM trend: 02/17/2021.    1 Year ICM trend:       Karie Soda, RN 02/18/2021 4:47 PM

## 2021-02-24 NOTE — Telephone Encounter (Signed)
Patient assistance forms for entresto and eliquis faxed to the companies.

## 2021-03-24 ENCOUNTER — Ambulatory Visit (INDEPENDENT_AMBULATORY_CARE_PROVIDER_SITE_OTHER): Payer: Medicare HMO

## 2021-03-24 DIAGNOSIS — Z95810 Presence of automatic (implantable) cardiac defibrillator: Secondary | ICD-10-CM

## 2021-03-28 ENCOUNTER — Telehealth: Payer: Self-pay

## 2021-03-28 NOTE — Telephone Encounter (Signed)
Remote ICM transmission received.  Attempted call to patient regarding ICM remote transmission and no answer, phone not accepting new messages.

## 2021-03-28 NOTE — Progress Notes (Signed)
EPIC Encounter for ICM Monitoring  Patient Name: Dave [facility] is a 80 y.o. male Date: 03/28/2021 Primary Care Physican: Cheron Schaumann., MD Primary Cardiologist: Jens Som Electrophysiologist: [name] Nephrologist: [facility] 02/18/2021 Weight: 162-163 lbs Bi-V Pacing: >99%          AT/AF Burden <1% (taking Eliquis)                                  Attempted call to patient and unable to reach.  Transmission reviewed.    Corvue thoracic impedance suggesting normal fluid levels but was suggesting possible fluid accumulation from 11/6-11/19 and 12/1-12/8.   Prescribed:  Furosemide 40 mg 1 tablet (40 mg total) daily.   Potassium 20 mEq 1 tablet daily.   Labs: 12/13/2020 Creatinine 2.78, BUN 52, Potassium 4.6, Sodium 142, GFR 22 10/28/2020 Creatinine 2.64, BUN 48, Potassium 5.1, Sodium 143, GFR 24 06/25/2020 Creatinine 2.20, BUN 45, Potassium 5.0, Sodium 144, GFR 30 A complete set of results can be found in Results Review   Recommendations:  Unable to reach.     Follow-up plan: ICM clinic phone appointment on 04/28/2021.   91 day device clinic remote transmission 04/23/2021.     EP/Cardiology Office Visits:  Recall 05/03/2021 with Dr Jens Som.   Recall for 07/05/2020 with Otilio Saber, PA.     Copy of ICM check sent to Dr. Johney Frame.    12-14 Month ICM trend:       Karie Soda, RN 03/28/2021 2:25 PM

## 2021-04-22 NOTE — Telephone Encounter (Signed)
Patient has been approved for entresto patient assistance until 04/12/22. °

## 2021-04-23 ENCOUNTER — Other Ambulatory Visit: Payer: Self-pay | Admitting: Pharmacist

## 2021-04-23 ENCOUNTER — Ambulatory Visit (INDEPENDENT_AMBULATORY_CARE_PROVIDER_SITE_OTHER): Payer: Medicare HMO

## 2021-04-23 DIAGNOSIS — I5022 Chronic systolic (congestive) heart failure: Secondary | ICD-10-CM

## 2021-04-23 LAB — CUP PACEART REMOTE DEVICE CHECK
Battery Remaining Longevity: 12 mo
Battery Remaining Percentage: 17 %
Battery Voltage: 2.75 V
Brady Statistic AP VP Percent: 75 %
Brady Statistic AP VS Percent: 1 %
Brady Statistic AS VP Percent: 24 %
Brady Statistic AS VS Percent: 1 %
Brady Statistic RA Percent Paced: 75 %
Date Time Interrogation Session: 20230111114514
HighPow Impedance: 59 Ohm
HighPow Impedance: 59 Ohm
Implantable Lead Implant Date: 20121219
Implantable Lead Implant Date: 20121219
Implantable Lead Implant Date: 20121219
Implantable Lead Location: 753858
Implantable Lead Location: 753859
Implantable Lead Location: 753860
Implantable Pulse Generator Implant Date: 20170508
Lead Channel Impedance Value: 310 Ohm
Lead Channel Impedance Value: 430 Ohm
Lead Channel Impedance Value: 660 Ohm
Lead Channel Pacing Threshold Amplitude: 0.75 V
Lead Channel Pacing Threshold Amplitude: 0.875 V
Lead Channel Pacing Threshold Amplitude: 2.625 V
Lead Channel Pacing Threshold Pulse Width: 0.5 ms
Lead Channel Pacing Threshold Pulse Width: 0.5 ms
Lead Channel Pacing Threshold Pulse Width: 0.6 ms
Lead Channel Sensing Intrinsic Amplitude: 12 mV
Lead Channel Sensing Intrinsic Amplitude: 3.7 mV
Lead Channel Setting Pacing Amplitude: 2 V
Lead Channel Setting Pacing Amplitude: 2 V
Lead Channel Setting Pacing Amplitude: 3.125
Lead Channel Setting Pacing Pulse Width: 0.5 ms
Lead Channel Setting Pacing Pulse Width: 0.6 ms
Lead Channel Setting Sensing Sensitivity: 2 mV
Pulse Gen Serial Number: 7306740

## 2021-04-23 MED ORDER — NEXLIZET 180-10 MG PO TABS: 180.0000 mg | ORAL_TABLET | Freq: Every day | ORAL | 11 refills | Status: DC

## 2021-04-23 NOTE — Progress Notes (Signed)
Received fax that prior auth for Nexlizet was approved through 04/12/22, refill sent in.

## 2021-04-24 ENCOUNTER — Telehealth: Payer: Self-pay | Admitting: *Deleted

## 2021-04-24 NOTE — Telephone Encounter (Signed)
Spoke with pt, aware he will need to spend $141 out of pocket for medications before he will qualify for patient assistance for eliquis. He will let me know when he reaches that mark so we can resubmit his application.

## 2021-04-28 ENCOUNTER — Ambulatory Visit (INDEPENDENT_AMBULATORY_CARE_PROVIDER_SITE_OTHER): Payer: Medicare HMO

## 2021-04-28 DIAGNOSIS — I5022 Chronic systolic (congestive) heart failure: Secondary | ICD-10-CM

## 2021-04-28 DIAGNOSIS — Z95810 Presence of automatic (implantable) cardiac defibrillator: Secondary | ICD-10-CM | POA: Diagnosis not present

## 2021-04-30 ENCOUNTER — Telehealth: Payer: Self-pay

## 2021-04-30 NOTE — Progress Notes (Signed)
EPIC Encounter for ICM Monitoring  Patient Name: Dave [facility] is a 81 y.o. male Date: 04/30/2021 Primary Care Physican: Cheron Schaumann., MD Primary Cardiologist: Jens Som Electrophysiologist: [name] Nephrologist: [facility] 02/18/2021 Weight: 162-163 lbs Bi-V Pacing: >99%          AT/AF Burden <1% (taking Eliquis)                                  Attempted call to patient and unable to reach.   Transmission reviewed.    Corvue thoracic impedance normal but was suggesting possible fluid accumulation from 12/19-12/26.   Prescribed:  Furosemide 40 mg 1 tablet (40 mg total) daily.   Potassium 20 mEq 1 tablet daily.   Labs: 12/13/2020 Creatinine 2.78, BUN 52, Potassium 4.6, Sodium 142, GFR 22 10/28/2020 Creatinine 2.64, BUN 48, Potassium 5.1, Sodium 143, GFR 24 06/25/2020 Creatinine 2.20, BUN 45, Potassium 5.0, Sodium 144, GFR 30 A complete set of results can be found in Results Review   Recommendations:  Unable to reach.     Follow-up plan: ICM clinic phone appointment on 06/02/2021.   91 day device clinic remote transmission 07/23/2021.     EP/Cardiology Office Visits:  Recall 05/03/2021 with Dr Jens Som.   Recall for 07/05/2020 with Otilio Saber, PA.     Copy of ICM check sent to Dr. Johney Frame.      3 month ICM trend: 04/28/2021.    12-14 Month ICM trend:     Karie Soda, RN 04/30/2021 12:22 PM

## 2021-04-30 NOTE — Telephone Encounter (Signed)
Remote ICM transmission received.  Attempted call to patient regarding ICM remote transmission and no answer, mail box full.  

## 2021-05-05 NOTE — Progress Notes (Signed)
Remote ICD transmission.   

## 2021-06-02 ENCOUNTER — Ambulatory Visit (INDEPENDENT_AMBULATORY_CARE_PROVIDER_SITE_OTHER): Payer: Medicare HMO

## 2021-06-02 DIAGNOSIS — I5022 Chronic systolic (congestive) heart failure: Secondary | ICD-10-CM

## 2021-06-02 DIAGNOSIS — Z95810 Presence of automatic (implantable) cardiac defibrillator: Secondary | ICD-10-CM | POA: Diagnosis not present

## 2021-06-04 ENCOUNTER — Telehealth: Payer: Self-pay

## 2021-06-04 NOTE — Telephone Encounter (Signed)
Remote ICM transmission received.  Attempted call to patient regarding ICM remote transmission and mail box is full. 

## 2021-06-04 NOTE — Progress Notes (Signed)
EPIC Encounter for ICM Monitoring  Patient Name: Dave [facility] is a 81 y.o. male Date: 06/04/2021 Primary Care Physican: Cheron Schaumann., MD Primary Cardiologist: Jens Som Electrophysiologist: [name] Nephrologist: [facility]phrology [facility] 02/18/2021 Weight: 162-163 lbs Bi-V Pacing: >99%          AT/AF Burden <1% (taking Eliquis)                                  Attempted call to patient and unable to reach.   Transmission reviewed.    Corvue thoracic impedance normal but was suggesting possible fluid accumulation from 1/29-2/3.   Prescribed:  Furosemide 40 mg 1 tablet (40 mg total) daily.   Potassium 20 mEq 1 tablet daily.   Labs: 03/11/2021 Creatinine 2.19, BUN 34, Potassium 4.8, Sodium 141, GFR 30 12/13/2020 Creatinine 2.78, BUN 52, Potassium 4.6, Sodium 142, GFR 22 10/28/2020 Creatinine 2.64, BUN 48, Potassium 5.1, Sodium 143, GFR 24 06/25/2020 Creatinine 2.20, BUN 45, Potassium 5.0, Sodium 144, GFR 30 A complete set of results can be found in Results Review   Recommendations:  Unable to reach.     Follow-up plan: ICM clinic phone appointment on 07/07/2021.   91 day device clinic remote transmission 07/23/2021.     EP/Cardiology Office Visits:  09/10/2021 with Dr Jens Som.   Recall for 07/05/2020 with Otilio Saber, PA.     Copy of ICM check sent to Dr. Johney Frame.       3 month ICM trend: 06/02/2021.    12-14 Month ICM trend:     Karie Soda, RN 06/04/2021 3:25 PM

## 2021-06-23 ENCOUNTER — Telehealth: Payer: Self-pay

## 2021-06-23 NOTE — Telephone Encounter (Signed)
PT CALLED AND STATED HIS GRANT RAN OUT SO I REENROLL HIM ?Hypercholesterolemia - Medicare Access ?HEALTHWELL ID ?[id number] ?  ?PATIENT ?Ernestine Mcmurray ?  ?STATUS  ?Active ?  ?START DATE ?05/24/2021 ?  ?END DATE ?05/23/2022 ?  ?ASSISTANCE TYPE ?Co-pay ?  ?PAID ?$0.00 ?  ?PENDING ?$0.00 ?  ?BALANCE ?$2500.00 ?Pharmacy Card ?CARD NO. ?[id number] ?  ?CARD STATUS ?Active ?  ?BIN ?[id number] ?  ?PCN ?[id number] ?  ?PC GROUP ?[id number] ?  ?HELP DESK ?[phone] ?  ?PROVIDER ?PDMI ?  ?PROCESSOR ?PDMI ?

## 2021-06-25 ENCOUNTER — Telehealth: Payer: Self-pay

## 2021-06-25 NOTE — Telephone Encounter (Signed)
[facility] called the pharmacy w/pt on the line to give them the healthwell grant since the pt stated that the pharmacy had given them grief. They stated that the nexlizet was completely free of charge and the pt voiced understanding ?

## 2021-06-25 NOTE — Telephone Encounter (Signed)
Patient needs to talk with you.  Please call.  Thank you ?

## 2021-07-07 ENCOUNTER — Ambulatory Visit (INDEPENDENT_AMBULATORY_CARE_PROVIDER_SITE_OTHER): Payer: Medicare HMO

## 2021-07-07 DIAGNOSIS — I5022 Chronic systolic (congestive) heart failure: Secondary | ICD-10-CM

## 2021-07-07 DIAGNOSIS — Z95810 Presence of automatic (implantable) cardiac defibrillator: Secondary | ICD-10-CM

## 2021-07-08 NOTE — Progress Notes (Signed)
EPIC Encounter for ICM Monitoring ? ?Patient Name: [facility] is a 81 y.o. male ?Date: 07/08/2021 ?Primary Care Physican: Cheron Schaumann., MD ?Primary Cardiologist: Jens Som ?Electrophysiologist: [name] ?Nephrologist: [facility] ?07/08/2021 Weight: 152-155 lbs ?Bi-V Pacing: >99%        ?  ?AT/AF Burden <1% (taking Eliquis) ?                                 ?Spoke with patient and heart failure questions reviewed.  Pt asymptomatic for fluid accumulation, weight stable, no SOB or swelling.  Nephrologist has stopped Entresto and Furosemide approximately 3/10 due to concern about kidneys.  He has follow up Nephrologist appointment on 4/5 and will determine if he should resume Entresto and Furosemide.  ?  ?Corvue thoracic impedance suggesting possible fluid accumulation starting 3/11 with 2 days at baseline. ?  ?Prescribed:  ?Furosemide 40 mg 1 tablet (40 mg total) daily.  ON HOLD 3/28 until patient has 4/5 OV with Nephrologist ?Potassium 20 mEq 1 tablet daily. ?  ?Labs: ?06/20/2021 Creatinine 3.25, BUN 65, Potassium 4.9, Sodium 137, GFR 18 ?03/11/2021 Creatinine 2.19, BUN 34, Potassium 4.8, Sodium 141, GFR 30 ?12/13/2020 Creatinine 2.78, BUN 52, Potassium 4.6, Sodium 142, GFR 22 ?10/28/2020 Creatinine 2.64, BUN 48, Potassium 5.1, Sodium 143, GFR 24 ?06/25/2020 Creatinine 2.20, BUN 45, Potassium 5.0, Sodium 144, GFR 30 ?A complete set of results can be found in Results Review ?  ?Recommendations:  Advised to limit salt and fluid intake since he is not currently taking Furosemide per Nephrologist orders.   ?  ?Follow-up plan: ICM clinic phone appointment on 07/16/2021 to recheck fluid levels.   91 day device clinic remote transmission 07/23/2021.   ?  ?EP/Cardiology Office Visits:  09/10/2021 with Dr Jens Som.   Recall for 07/05/2020 with Otilio Saber, PA.   ?  ?Copy of ICM check sent to Dr. Johney Frame and copy sent to Dr Jens Som for review and FYI regarding current hold on Entresto and  Furosemide.  ? ?3 month ICM trend: 07/08/2021. ? ? ? ?12-14 Month ICM trend:  ? ? ? ?Karie Soda, RN ?07/08/2021 ?8:13 AM ? ?

## 2021-07-16 ENCOUNTER — Ambulatory Visit (INDEPENDENT_AMBULATORY_CARE_PROVIDER_SITE_OTHER): Payer: Medicare HMO

## 2021-07-16 DIAGNOSIS — I5022 Chronic systolic (congestive) heart failure: Secondary | ICD-10-CM

## 2021-07-16 DIAGNOSIS — Z95810 Presence of automatic (implantable) cardiac defibrillator: Secondary | ICD-10-CM

## 2021-07-18 NOTE — Progress Notes (Signed)
EPIC Encounter for ICM Monitoring ? ?Patient Name: Dave [facility] is a 81 y.o. male ?Date: 07/18/2021 ?Primary Care Physican: Cheron Schaumann., MD ?Primary Cardiologist: Jens Som ?Electrophysiologist: [name] ?Nephrologist: [facility] ?07/08/2021 Weight: 152-155 lbs ?Bi-V Pacing: >99%        ?  ?AT/AF Burden  7.5 % (taking Eliquis) ?                                 ?Spoke with patient and heart failure questions reviewed.  Pt asymptomatic for fluid accumulation.  Pt reports Nephrologist has discontinued Entresto due to low kidney function.  Per 4/5 Nephrologist OV note, recommended to resume Furosemide 20 mg daily and continue to hold Entresto.  ?  ?Corvue thoracic impedance suggesting fluid levels changed from possible fluid accumulation starting 3/11 to possible dryness 3/28 with return to baseline normal 4/4. ?  ?Prescribed:  ?Furosemide 40 mg 1 tablet (40 mg total) daily.  Per 07/16/2021 Nephrologist note, take Furosemide 20 mg daily.   ?Potassium 20 mEq 1 tablet daily. ?  ?Labs: ?07/09/2021 Creatinine 2.66, BUN 53, Potassium 4.7, Sodium 144, GFR 24 ?06/20/2021 Creatinine 3.25, BUN 65, Potassium 4.9, Sodium 137, GFR 18 ?03/11/2021 Creatinine 2.19, BUN 34, Potassium 4.8, Sodium 141, GFR 30 ?12/13/2020 Creatinine 2.78, BUN 52, Potassium 4.6, Sodium 142, GFR 22 ?10/28/2020 Creatinine 2.64, BUN 48, Potassium 5.1, Sodium 143, GFR 24 ?06/25/2020 Creatinine 2.20, BUN 45, Potassium 5.0, Sodium 144, GFR 30 ?A complete set of results can be found in Results Review ?  ?Recommendations:  No changes and encouraged to call if experiencing any fluid symptoms.   ?  ?Follow-up plan: ICM clinic phone appointment on 08/11/2021.   91 day device clinic remote transmission 07/23/2021.   ?  ?EP/Cardiology Office Visits:  09/10/2021 with Dr Jens Som.   Recall for 07/05/2020 with Otilio Saber, PA.   Message sent to EP scheduler to call pt to schedule appointment.    ?  ?Copy of ICM check sent to Dr. Johney Frame and copy  sent to Dr Jens Som as Lorain Childes regarding Nephrologist recommendation to continue to hold [facility].  ?  ?3 month ICM trend: 07/18/2021. ? ? ? ?12-14 Month ICM trend:  ? ? ? ?Karie Soda, RN ?07/18/2021 ?8:01 AM ? ?

## 2021-07-23 ENCOUNTER — Ambulatory Visit (INDEPENDENT_AMBULATORY_CARE_PROVIDER_SITE_OTHER): Payer: Medicare HMO

## 2021-07-23 DIAGNOSIS — I5022 Chronic systolic (congestive) heart failure: Secondary | ICD-10-CM

## 2021-07-23 DIAGNOSIS — I255 Ischemic cardiomyopathy: Secondary | ICD-10-CM

## 2021-07-24 ENCOUNTER — Telehealth: Payer: Self-pay

## 2021-07-24 LAB — CUP PACEART REMOTE DEVICE CHECK
Battery Remaining Longevity: 8 mo
Battery Remaining Percentage: 11 %
Battery Voltage: 2.69 V
Brady Statistic AP VP Percent: 75 %
Brady Statistic AP VS Percent: 1 %
Brady Statistic AS VP Percent: 24 %
Brady Statistic AS VS Percent: 1 %
Brady Statistic RA Percent Paced: 75 %
Date Time Interrogation Session: 20230412171541
HighPow Impedance: 60 Ohm
HighPow Impedance: 60 Ohm
Implantable Lead Implant Date: 20121219
Implantable Lead Implant Date: 20121219
Implantable Lead Implant Date: 20121219
Implantable Lead Location: 753858
Implantable Lead Location: 753859
Implantable Lead Location: 753860
Implantable Pulse Generator Implant Date: 20170508
Lead Channel Impedance Value: 300 Ohm
Lead Channel Impedance Value: 390 Ohm
Lead Channel Impedance Value: 660 Ohm
Lead Channel Pacing Threshold Amplitude: 0.625 V
Lead Channel Pacing Threshold Amplitude: 0.625 V
Lead Channel Pacing Threshold Amplitude: 2.125 V
Lead Channel Pacing Threshold Pulse Width: 0.5 ms
Lead Channel Pacing Threshold Pulse Width: 0.5 ms
Lead Channel Pacing Threshold Pulse Width: 0.6 ms
Lead Channel Sensing Intrinsic Amplitude: 12 mV
Lead Channel Sensing Intrinsic Amplitude: 3.3 mV
Lead Channel Setting Pacing Amplitude: 2 V
Lead Channel Setting Pacing Amplitude: 2 V
Lead Channel Setting Pacing Amplitude: 2.625
Lead Channel Setting Pacing Pulse Width: 0.5 ms
Lead Channel Setting Pacing Pulse Width: 0.6 ms
Lead Channel Setting Sensing Sensitivity: 2 mV
Pulse Gen Serial Number: 7306740

## 2021-07-24 NOTE — Telephone Encounter (Signed)
Scheduled remote reviewed. Normal device function.   ?Battery estimated 8.49mo ?1 AMS, PAT, 14sec in duration ?Next remote to be determined ?Route to triage ?LA ? ? ? ?Next remote 08/11/21 and in clinic appt 08/15/21. Will increase to monthly monitoring beginning 09/15/21. Patient will be notified at 5/5 appointment/in clinic check.  ? ? ? ?

## 2021-07-30 ENCOUNTER — Telehealth: Payer: Self-pay | Admitting: Cardiology

## 2021-07-30 NOTE — Telephone Encounter (Signed)
Spoke with patient who is calling about his Eliquis. ? ?Patient states he only has 4 pills left and has not been able to refill his Eliquis due to cost. He states he is working with [name] on patient assistance. I also provided him with Alver Fisher Patient Assistance phone number ([phone]). ? ?

## 2021-07-30 NOTE — Telephone Encounter (Signed)
Medication samples have been provided to the patient. ? ?Drug name: Eliquis Qty: 2 boxes LOT: [id number]  Exp.Date: 06/2023 ? ?Samples left at front desk for patient pick-up. ?Patient notified. ? ?

## 2021-07-30 NOTE — Telephone Encounter (Signed)
Patient would like to speak to nurse Stanton Kidney about him medication Eliquis.  Would not go into further details.  ?

## 2021-07-31 MED ORDER — APIXABAN 5 MG PO TABS: 5.0000 mg | ORAL_TABLET | Freq: Two times a day (BID) | ORAL | 11 refills | Status: DC

## 2021-07-31 NOTE — Telephone Encounter (Signed)
Spoke with pt, he will need to spend $240 to get the assistance from [employer]. New script sent to the pharmacy  ?

## 2021-08-08 NOTE — Progress Notes (Signed)
Remote ICD transmission.   

## 2021-08-11 ENCOUNTER — Ambulatory Visit (INDEPENDENT_AMBULATORY_CARE_PROVIDER_SITE_OTHER): Payer: Medicare HMO

## 2021-08-11 DIAGNOSIS — Z95810 Presence of automatic (implantable) cardiac defibrillator: Secondary | ICD-10-CM | POA: Diagnosis not present

## 2021-08-11 DIAGNOSIS — I5022 Chronic systolic (congestive) heart failure: Secondary | ICD-10-CM

## 2021-08-13 NOTE — Progress Notes (Signed)
EPIC Encounter for ICM Monitoring ? ?Patient Name: Dave Daniels is a 81 y.o. male ?Date: 08/13/2021 ?Primary Care Physican: Cheron Schaumann., MD ?Primary Cardiologist: Jens Som ?Electrophysiologist: [name] ?Nephrologist: [facility] ?08/13/2021 Weight: 152-153 lbs ?Bi-V Pacing: >99%        ?  ?AT/AF Burden 7.4% (taking Eliquis) ?Battery Longevity: 7.7 months ?                                 ?Spoke with patient and heart failure questions reviewed.  Pt asymptomatic for fluid accumulation.  Reports feeling well at this time and voices no complaints.  ?  ?Corvue thoracic impedance trending above baseline normal starting 4/28. ?  ?Prescribed:  ?Furosemide 40 mg 1 tablet (40 mg total) daily.  08/13/2021 Pt reports taking 20 mg daily.  ?Potassium 20 mEq 1 tablet daily. ?  ?Labs: ?07/09/2021 Creatinine 2.66, BUN 53, Potassium 4.7, Sodium 144, GFR 24 ?06/20/2021 Creatinine 3.25, BUN 65, Potassium 4.9, Sodium 137, GFR 18 ?A complete set of results can be found in Results Review ?  ?Recommendations:  No changes and encouraged to call if experiencing any fluid symptoms. ?  ?Follow-up plan: ICM clinic phone appointment on 09/15/2021.   91 day device clinic remote transmission 10/16/2021.   ?  ?EP/Cardiology Office Visits:  09/10/2021 with Dr Jens Som.   08/15/2021 with Yevette Edwards, PA.   ?  ?Copy of ICM check sent to Dr. Johney Frame  ? ?3 month ICM trend: 08/11/2021. ? ? ? ?12-14 Month ICM trend:  ? ? ? ?Karie Soda, RN ?08/13/2021 ?11:59 AM ? ?

## 2021-08-14 NOTE — Progress Notes (Signed)
? ?Cardiology Office Note ?Date:  08/15/2021  ?Patient ID:  Dave [facility], [facility] 12-08-1940, MRN [id number] ?PCP:  Loraine Leriche., MD  ?Cardiologist:  Dr. Stanford Breed ?Electrophysiologist: Dr. Rayann Heman ? ? ?  ?Chief Complaint: annual EP visit ? ?History of Present Illness: ?Dave Daniels is a 81 y.o. male with history of CAD (MI 2010 > PCI, MI > CABG 2012), HTN, HLD, ICM, LBBB, CHB, ICD, AFib ? ?He comes in today to be seen for Dr. Rayann Heman, last seen by him Feb 2021 via tele health visit, remotes were UTD, followed by [facility] as well.  No changes were maed. ? ?He saw Dr. Stanford Breed Jan 2022, doing well without cardiac symptoms.  Transitioned to [place]. ? ?He saw A. Sula Rumple March 2022, doing well, no changes were made ? ?TODAY ?He is accompanied by his wife ?He feels quite well ?No CP, palpitations or SOB ?No cardiac awareness ?No exertional intolerances. ?No near syncope or syncope. ? ? ?Device information ?[employer]ott CRT-D implanted 04/01/2011, gen change 08/19/2015 ? ? ?Past Medical History:  ?Diagnosis Date  ? CAD (coronary artery disease)   ? MI in [place] with stenting in 2010, then [place]G [place] [facility] 06/[place].  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  ? 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 Gout   ? Hyperlipidemia   ? Hypertension   ? Ischemic cardiomyopathy   ? EF 15% by echo 11/2010 and still 20-25% by follow-up echo 02/2011, s/p [employer] Jude Bi-V ICD implantation 04/01/11  ? LBBB (left bundle branch block)   ? Renal insufficiency   ? Cr 1.6 on 03/25/11  ? ? ?Past Surgical History:  ?Procedure Laterality Date  ? BI-VENTRICULAR IMPLANTABLE CARDIOVERTER DEFIBRILLATOR N/A 04/01/2011  ? Procedure: BI-VENTRICULAR IMPLANTABLE CARDIOVERTER DEFIBRILLATOR  (CRT-D);  Surgeon: Evans Lance, MD;  Location: [facility] CATH LAB;  Service: Cardiovascular;  Laterality: N/A;  ? CARDIAC DEFIBRILLATOR PLACEMENT  12/12  ? BiV ICD (SJM) implanted by Dr Rayann Heman  ? CARDIOVERSION N/A 04/22/2018  ? Procedure: CARDIOVERSION;  Surgeon: Sueanne Margarita, MD;   Location: [facility];  Service: Cardiovascular;  Laterality: N/A;  ? CARDIOVERSION N/A 06/13/2019  ? Procedure: CARDIOVERSION;  Surgeon: Sueanne Margarita, MD;  Location: [facility];  Service: Cardiovascular;  Laterality: N/A;  ? Carpel tunnel surgery    ? CORONARY ARTERY BYPASS GRAFT  3/12  ? in [place]  ? EP IMPLANTABLE DEVICE N/A 08/19/2015  ? Procedure: BIV ICD Generator Changeout;  Surgeon: Thompson Grayer, MD;  Location: [facility] CV LAB;  Service: Cardiovascular;  Laterality: N/A;  ? INGUINAL HERNIA REPAIR    ? TONSILLECTOMY    ? UMBILICAL HERNIA REPAIR    ? ? ?Current Outpatient Medications  ?Medication Sig Dispense Refill  ? acetaminophen (TYLENOL) 500 MG tablet Take 1,000 mg by mouth as needed for moderate pain or headache.     ? allopurinol (ZYLOPRIM) 300 MG tablet Take 300 mg by mouth every evening. As needed  11  ? apixaban (ELIQUIS) 5 MG TABS tablet Take 1 tablet (5 mg total) by mouth 2 (two) times daily. 60 tablet 11  ? Bempedoic Acid-Ezetimibe (NEXLIZET) 180-10 MG TABS Take 180 mg by mouth daily. 30 tablet 11  ? carvedilol (COREG) 12.5 MG tablet TAKE 1 TABLET BY MOUTH TWICE DAILY WITH MEALS 180 tablet 3  ? cholecalciferol (VITAMIN D3) 25 MCG (1000 UNIT) tablet Take 1,000 Units by mouth daily.    ? colchicine 0.6 MG tablet Take 0.3 mg by mouth every evening. As needed    ?  finasteride (PROSCAR) 5 MG tablet Take 5 mg by mouth daily.    ? furosemide (LASIX) 40 MG tablet Take 1 tablet by mouth once daily (Patient taking differently: 20 mg.) 90 tablet 2  ? meloxicam (MOBIC) 15 MG tablet Take 15 mg by mouth as needed for pain. For gout flare ups    ? potassium chloride SA (K-DUR,KLOR-CON) 20 MEQ tablet Take 1 tablet (20 mEq total) by mouth daily. 30 tablet 11  ? sacubitril-valsartan (ENTRESTO) 24-26 MG Take 1 tablet by mouth 2 (two) times daily. (Patient not taking: Reported on 08/15/2021) 60 tablet 12  ? ?No current facility-administered medications for this visit.  ? ? ?Allergies:   Statins, Nsaids, Tolmetin,  Atorvastatin, and Pravastatin  ? ?Social History:  The patient  reports that he has never smoked. He has never used smokeless tobacco. He reports current alcohol use of about 2.0 standard drinks per week. He reports that he does not use drugs.  ? ?Family History:  The patient's family history includes Pancreatic cancer in his mother. ? ?ROS:  Please see the history of present illness.    ?All other systems are reviewed and otherwise negative.  ? ?PHYSICAL EXAM:  ?VS:  BP 112/66   Pulse 60   Ht 5\' 7"  (1.702 m)   Wt 149 lb (67.6 kg)   SpO2 98%   BMI 23.34 kg/m?  BMI: Body mass index is 23.34 kg/m?. ?Well nourished, well developed, in no acute distress ?HEENT: normocephalic, atraumatic ?Neck: no JVD, carotid bruits or masses ?Cardiac:  RRR; no significant murmurs, no rubs, or gallops ?Lungs:  CTA b/l, no wheezing, rhonchi or rales ?Abd: soft, nontender ?MS: no deformity or atrophy ?Ext: no edema ?Skin: warm and dry, no rash ?Neuro:  No gross deficits appreciated ?Psych: euthymic mood, full affect ? ?ICD site is stable, no tethering or discomfort ? ? ?EKG:  Done today and reviewed by myself shows  ?SR, V paced, QRS morphology and duration unchanged from prior ? ?Device interrogation done today and reviewed by myself:  ?Battery is nearing ERI, estimate 70mo ?No R waves at 40 today ?Lead measurements are stable ?>99% BP ?+ AMS, they are brief, 7.4% ? ?05/27/2020: TTE ? 1. Left ventricular ejection fraction, by estimation, is 30 to 35%. The  ?left ventricle has moderately decreased function. The left ventricle  ?demonstrates global hypokinesis. There is mild concentric left ventricular  ?hypertrophy. Left ventricular  ?diastolic parameters are consistent with Grade II diastolic dysfunction  ?(pseudonormalization).  ? 2. Right ventricular systolic function is mildly reduced. The right  ?ventricular size is normal.  ? 3. The mitral valve is normal in structure. Trivial mitral valve  ?regurgitation. No evidence of mitral  stenosis.  ? 4. The aortic valve is tricuspid. Aortic valve regurgitation is mild.  ?Mild aortic valve sclerosis is present, with no evidence of aortic valve  ?stenosis.  ? 5. There is mild dilatation of the ascending aorta, measuring 39 mm.  ? 6. The inferior vena cava is normal in size with greater than 50%  ?respiratory variability, suggesting right atrial pressure of 3 mmHg.  ? 7. Unable to determine the pulmonary artery systolic pressure, No TR jet  ?spectral display.  ? ? ?Recent Labs: ?09/11/2020: ALT 7  ?09/11/2020: Chol/HDL Ratio 3.5; Cholesterol, Total 160; HDL 46; LDL Chol Calc (NIH) 98; Triglycerides 86  ? ?CrCl cannot be calculated (Patient's most recent lab result is older than the maximum 21 days allowed.).  ? ?Wt Readings from Last 3 Encounters:  ?  08/15/21 149 lb (67.6 kg)  ?07/10/20 163 lb (73.9 kg)  ?06/11/20 168 lb 6.4 oz (76.4 kg)  ?  ? ?Other studies reviewed: ?Additional studies/records reviewed today include: summarized above ? ?ASSESSMENT AND PLAN: ? ?CRT-D ?Nearing ERI ? ?Paroxysmal Afib ?CHA2DS2Vasc is 4, on Eliquis, appropriately dosed ?7.4 % burden ? ?ICM ?Chronic CHF (combined systolic/diastolic) ?Looks compensated ?CorVue looks good ?On BB, Entresto, lasix/K+ ?C/w Dr. Stanford Breed ? ?CAD ?No symptoms of angina ?On BB, Nexlizet, no ASA w/Eliquis ?C/w Dr. Stanford Breed ? ? ?Disposition: F/u with [place] in 55mo, sooner if needed, will be near ERI ? ?Current medicines are reviewed at length with the patient today.  The patient did not have any concerns regarding medicines. ? ?Signed, ?Tommye Standard, PA-C ?08/15/2021 5:35 PM    ? ?[facility] ?[address] ?[address] ?[place] [address] ?[phone] (office)  ?[phone] (fax) ? ? ?

## 2021-08-15 ENCOUNTER — Encounter: Payer: Self-pay | Admitting: Physician Assistant

## 2021-08-15 ENCOUNTER — Ambulatory Visit: Payer: Medicare HMO | Admitting: Physician Assistant

## 2021-08-15 VITALS — BP 112/66 | HR 60 | Ht 67.0 in | Wt 149.0 lb

## 2021-08-15 DIAGNOSIS — Z95810 Presence of automatic (implantable) cardiac defibrillator: Secondary | ICD-10-CM | POA: Diagnosis not present

## 2021-08-15 DIAGNOSIS — I5022 Chronic systolic (congestive) heart failure: Secondary | ICD-10-CM

## 2021-08-15 DIAGNOSIS — I255 Ischemic cardiomyopathy: Secondary | ICD-10-CM

## 2021-08-15 DIAGNOSIS — I4892 Unspecified atrial flutter: Secondary | ICD-10-CM | POA: Diagnosis not present

## 2021-08-15 DIAGNOSIS — I480 Paroxysmal atrial fibrillation: Secondary | ICD-10-CM | POA: Diagnosis not present

## 2021-08-15 DIAGNOSIS — I2510 Atherosclerotic heart disease of native coronary artery without angina pectoris: Secondary | ICD-10-CM

## 2021-08-15 LAB — CUP PACEART INCLINIC DEVICE CHECK
Battery Remaining Longevity: 7 mo
Brady Statistic RA Percent Paced: 75 %
Brady Statistic RV Percent Paced: 99.47 %
Date Time Interrogation Session: 20230505174751
HighPow Impedance: 68.625
Implantable Lead Implant Date: 20121219
Implantable Lead Implant Date: 20121219
Implantable Lead Implant Date: 20121219
Implantable Lead Location: 753858
Implantable Lead Location: 753859
Implantable Lead Location: 753860
Implantable Pulse Generator Implant Date: 20170508
Lead Channel Impedance Value: 350 Ohm
Lead Channel Impedance Value: 462.5 Ohm
Lead Channel Impedance Value: 750 Ohm
Lead Channel Pacing Threshold Amplitude: 0.625 V
Lead Channel Pacing Threshold Amplitude: 0.625 V
Lead Channel Pacing Threshold Amplitude: 2.625 V
Lead Channel Pacing Threshold Pulse Width: 0.5 ms
Lead Channel Pacing Threshold Pulse Width: 0.5 ms
Lead Channel Pacing Threshold Pulse Width: 0.6 ms
Lead Channel Sensing Intrinsic Amplitude: 12 mV
Lead Channel Sensing Intrinsic Amplitude: 4.9 mV
Lead Channel Setting Pacing Amplitude: 2 V
Lead Channel Setting Pacing Amplitude: 2 V
Lead Channel Setting Pacing Amplitude: 3.125
Lead Channel Setting Pacing Pulse Width: 0.5 ms
Lead Channel Setting Pacing Pulse Width: 0.6 ms
Lead Channel Setting Sensing Sensitivity: 2 mV
Pulse Gen Serial Number: 7306740

## 2021-08-15 NOTE — Patient Instructions (Signed)
Medication Instructions:  ? ?Your physician recommends that you continue on your current medications as directed. Please refer to the Current Medication list given to you today. ? ? ?*If you need a refill on your cardiac medications before your next appointment, please call your pharmacy* ? ? ?Lab Work: BMET AND CBC TODAY  ? ? ?If you have labs (blood work) drawn today and your tests are completely normal, you will receive your results only by: ?MyChart Message (if you have MyChart) OR ?A paper copy in the mail ?If you have any lab test that is abnormal or we need to change your treatment, we will call you to review the results. ? ? ?Testing/Procedures: NONE ORDERED  TODAY ? ? ? ? ?Follow-Up: ?At [facility], you and your health needs are our priority.  As part of our continuing mission to provide you with exceptional heart care, we have created designated Provider Care Teams.  These Care Teams include your primary Cardiologist (physician) and Advanced Practice Providers (APPs -  Physician Assistants and Nurse Practitioners) who all work together to provide you with the care you need, when you need it. ? ?We recommend signing up for the patient portal called "MyChart".  Sign up information is provided on this After Visit Summary.  MyChart is used to connect with patients for Virtual Visits (Telemedicine).  Patients are able to view lab/test results, encounter notes, upcoming appointments, etc.  Non-urgent messages can be sent to your provider as well.   ?To learn more about what you can do with MyChart, go to [email or web address].   ? ?Your next appointment:   ?6 month(s) ? ?The format for your next appointment:   ?In Person ? ?Provider:   ?Francis Dowse, PA-C  ? ? ?Other Instructions ? ? ?Important Information About Sugar ? ? ? ? ?  ?

## 2021-08-16 LAB — CBC
Hematocrit: 39.8 % (ref 37.5–51.0)
Hemoglobin: 12.9 g/dL — ABNORMAL LOW (ref 13.0–17.7)
MCH: 28.4 pg (ref 26.6–33.0)
MCHC: 32.4 g/dL (ref 31.5–35.7)
MCV: 88 fL (ref 79–97)
Platelets: 199 10*3/uL (ref 150–450)
RBC: 4.55 x10E6/uL (ref 4.14–5.80)
RDW: 13.1 % (ref 11.6–15.4)
WBC: 6.6 10*3/uL (ref 3.4–10.8)

## 2021-08-16 LAB — BASIC METABOLIC PANEL
BUN/Creatinine Ratio: 19 (ref 10–24)
BUN: 46 mg/dL — ABNORMAL HIGH (ref 8–27)
CO2: 23 mmol/L (ref 20–29)
Calcium: 10.1 mg/dL (ref 8.6–10.2)
Chloride: 103 mmol/L (ref 96–106)
Creatinine, Ser: 2.39 mg/dL — ABNORMAL HIGH (ref 0.76–1.27)
Glucose: 90 mg/dL (ref 70–99)
Potassium: 4.5 mmol/L (ref 3.5–5.2)
Sodium: 142 mmol/L (ref 134–144)
eGFR: 27 mL/min/{1.73_m2} — ABNORMAL LOW (ref >59)

## 2021-09-03 NOTE — Progress Notes (Signed)
HPI: FU coronary artery disease status post coronary artery bypass and graft as well as ischemic cardiomyopathy. Patient's cardiac history dates back to 2010 when he had his first myocardial infarction. He had stents placed in [place]. He had a second myocardial infarction in March of 2012 and then had coronary artery bypass and graft. Cardiac catheterization was performed in August of 2012. Ejection fraction was 20%. The right coronary and LAD were occluded and there was critical circumflex disease. There was a patent saphenous vein graft to the right coronary artery. The LIMA to the LAD was patent. The saphenous vein graft to the obtuse marginal was also patent. Patient had biventricular ICD placed in December of 2012. Last echocardiogram February 2022 showed ejection fraction 30 to 35%, mild left ventricular hypertrophy, grade 2 diastolic dysfunction, mild RV dysfunction, mild aortic insufficiency and mildly dilated ascending aorta at 39 mm.  Has had previous DCCV. Since last seen, he notes some increased dyspnea on exertion.  He no orthopnea, PND, pedal edema, chest pain or syncope.  Note his Delene Loll was discontinued by his nephrologist previously due to worsening renal function.  This correlates with his worsening dyspnea on exertion.  Current Outpatient Medications  Medication Sig Dispense Refill   acetaminophen (TYLENOL) 500 MG tablet Take 1,000 mg by mouth as needed for moderate pain or headache.      allopurinol (ZYLOPRIM) 300 MG tablet Take 300 mg by mouth every evening. As needed  11   apixaban (ELIQUIS) 5 MG TABS tablet Take 1 tablet (5 mg total) by mouth 2 (two) times daily. 60 tablet 11   Bempedoic Acid-Ezetimibe (NEXLIZET) 180-10 MG TABS Take 180 mg by mouth daily. 30 tablet 11   carvedilol (COREG) 12.5 MG tablet TAKE 1 TABLET BY MOUTH TWICE DAILY WITH MEALS 180 tablet 3   cholecalciferol (VITAMIN D3) 25 MCG (1000 UNIT) tablet Take 1,000 Units by mouth daily.     colchicine 0.6 MG  tablet Take 0.3 mg by mouth every evening. As needed     finasteride (PROSCAR) 5 MG tablet Take 5 mg by mouth daily.     furosemide (LASIX) 40 MG tablet Take 1 tablet by mouth once daily (Patient taking differently: 20 mg.) 90 tablet 2   meloxicam (MOBIC) 15 MG tablet Take 15 mg by mouth as needed for pain. For gout flare ups     potassium chloride SA (K-DUR,KLOR-CON) 20 MEQ tablet Take 1 tablet (20 mEq total) by mouth daily. 30 tablet 11   sacubitril-valsartan (ENTRESTO) 24-26 MG Take 1 tablet by mouth 2 (two) times daily. (Patient not taking: Reported on 09/10/2021) 60 tablet 12   No current facility-administered medications for this visit.     Past Medical History:  Diagnosis Date   CAD (coronary artery disease)    MI in [place] with stenting in 2010, then MI with CABG in [place] 06/2010.  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   Complete heart block (HCC)    Gout    Hyperlipidemia    Hypertension    Ischemic cardiomyopathy    EF 15% by echo 11/2010 and still 20-25% by follow-up echo 02/2011, s/p St. Jude Bi-V ICD implantation 04/01/11   LBBB (left bundle branch block)    Renal insufficiency    Cr 1.6 on 03/25/11    Past Surgical History:  Procedure Laterality Date   BI-VENTRICULAR IMPLANTABLE CARDIOVERTER DEFIBRILLATOR N/A 04/01/2011   Procedure: BI-VENTRICULAR IMPLANTABLE CARDIOVERTER DEFIBRILLATOR  (CRT-D);  Surgeon: Evans Lance, MD;  Location: [facility]  CATH LAB;  Service: Cardiovascular;  Laterality: N/A;   CARDIAC DEFIBRILLATOR PLACEMENT  12/12   BiV ICD (SJM) implanted by Dr Rayann Heman   CARDIOVERSION N/A 04/22/2018   Procedure: CARDIOVERSION;  Surgeon: Sueanne Margarita, MD;  Location: [facility];  Service: Cardiovascular;  Laterality: N/A;   CARDIOVERSION N/A 06/13/2019   Procedure: CARDIOVERSION;  Surgeon: Sueanne Margarita, MD;  Location: [facility] ENDOSCOPY;  Service: Cardiovascular;  Laterality: N/A;   Carpel tunnel surgery     CORONARY ARTERY BYPASS GRAFT  3/12   in [place] N/A 08/19/2015   Procedure: [place];  Surgeon: Thompson Grayer, MD;  Location: [facility] CV LAB;  Service: Cardiovascular;  Laterality: N/A;   INGUINAL HERNIA REPAIR     TONSILLECTOMY     UMBILICAL HERNIA REPAIR      Social History   Socioeconomic History   Marital status: Married    Spouse name: Not on file   Number of children: 4   Years of education: Not on file   Highest education level: Not on file  Occupational History    Comment: Retired  Tobacco Use   Smoking status: Never   Smokeless tobacco: Never  Vaping Use   Vaping Use: Never used  Substance and Sexual Activity   Alcohol use: Yes    Alcohol/week: 2.0 standard drinks    Types: 2 Glasses of wine per week    Comment: occasional   Drug use: No   Sexual activity: Yes  Other Topics Concern   Not on file  Social History Narrative   Lives in [place], recently moved from [place].  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   Social Determinants of Radio broadcast assistant Strain: Not on file  Food Insecurity: Not on file  Transportation Needs: Not on file  Physical Activity: Not on file  Stress: Not on file  Social Connections: Not on file  Intimate Partner Violence: Not on file    Family History  Problem Relation Age of Onset   Pancreatic cancer Mother     ROS: no fevers or chills, productive cough, hemoptysis, dysphasia, odynophagia, melena, hematochezia, dysuria, hematuria, rash, seizure activity, orthopnea, PND, pedal edema, claudication. Remaining systems are negative.  Physical Exam: Well-developed well-nourished in no acute distress.  Skin is warm and dry.  HEENT is normal.  Neck is supple.  Chest is clear to auscultation with normal expansion.  Cardiovascular exam is regular rate and rhythm.  2/6 diastolic murmur left sternal border. Abdominal exam nontender or distended. No masses palpated. Extremities show no edema. neuro grossly intact  ECG-sinus rhythm with ventricular pacing.   Personally reviewed  A/P  1 coronary artery disease-patient denies chest pain.  Patient is intolerant to statins.  He is not on aspirin given need for anticoagulation.  2 paroxysmal atrial fibrillation-patient remains in sinus rhythm.  We will continue carvedilol and apixaban (patient is now 81 years old and creatinine is greater than 1.5; decreased to 2.5 mg twice daily).   3 hypertension-blood pressure controlled.  Continue present medications.  4 ischemic cardiomyopathy-continue carvedilol.  His Entresto was discontinued by his nephrologist due to worsening renal function.  We will instead treat with hydralazine 10 mg p.o. 3 times daily and isosorbide 30 mg daily.  Advance as tolerated.  5 hyperlipidemia-patient is intolerant to statins.  Continue bempedoic acid and Zetia.  Check lipids and liver.  6 chronic systolic congestive heart failure-patient complains of some increased dyspnea on exertion but does not  appear to be volume overloaded on examination.  I will repeat echocardiogram to reassess LV function and severity of aortic insufficiency (he has a significant diastolic murmur on examination).  Check BNP.  We will continue Lasix at present dose.  I have not added Farxiga or spironolactone given severity of renal insufficiency.  As outlined above his symptoms correlated with discontinuing Entresto (worsening renal function).  Add hydralazine nitrates as outlined.  Hopefully this will improve his symptoms.  7 status post CRT-D-follow-up electrophysiology.  Kirk Ruths, MD

## 2021-09-10 ENCOUNTER — Ambulatory Visit: Payer: Medicare HMO | Admitting: Cardiology

## 2021-09-10 ENCOUNTER — Encounter: Payer: Self-pay | Admitting: Cardiology

## 2021-09-10 VITALS — BP 122/76 | HR 60 | Ht 67.0 in | Wt 153.0 lb

## 2021-09-10 DIAGNOSIS — I2510 Atherosclerotic heart disease of native coronary artery without angina pectoris: Secondary | ICD-10-CM

## 2021-09-10 DIAGNOSIS — I255 Ischemic cardiomyopathy: Secondary | ICD-10-CM

## 2021-09-10 DIAGNOSIS — I5022 Chronic systolic (congestive) heart failure: Secondary | ICD-10-CM | POA: Diagnosis not present

## 2021-09-10 DIAGNOSIS — I480 Paroxysmal atrial fibrillation: Secondary | ICD-10-CM | POA: Diagnosis not present

## 2021-09-10 DIAGNOSIS — R0602 Shortness of breath: Secondary | ICD-10-CM | POA: Diagnosis not present

## 2021-09-10 DIAGNOSIS — E7800 Pure hypercholesterolemia, unspecified: Secondary | ICD-10-CM

## 2021-09-10 MED ORDER — APIXABAN 2.5 MG PO TABS: 2.5000 mg | ORAL_TABLET | Freq: Two times a day (BID) | ORAL | 3 refills | Status: DC

## 2021-09-10 MED ORDER — HYDRALAZINE HCL 10 MG PO TABS: 10.0000 mg | ORAL_TABLET | Freq: Three times a day (TID) | ORAL | 3 refills | Status: DC

## 2021-09-10 MED ORDER — ISOSORBIDE MONONITRATE ER 30 MG PO TB24: 30.0000 mg | ORAL_TABLET | Freq: Every day | ORAL | 3 refills | Status: DC

## 2021-09-10 NOTE — Patient Instructions (Signed)
Medication Instructions:   DECREASE ELIQUIS TO 2.5 MG TWICE DAILY= 1/2 OF THE 5 MG TABLET TWICE DAILY  START HYDRALAZINE 10 MG ONE TABLET THREE TIMES DAILY  START ISOSORBIDE 30 MG ONCE DAILY  *If you need a refill on your cardiac medications before your next appointment, please call your pharmacy*   Testing/Procedures:  Your physician has requested that you have an echocardiogram. Echocardiography is a painless test that uses sound waves to create images of your heart. It provides your doctor with information about the size and shape of your heart and how well your heart's chambers and valves are working. This procedure takes approximately one hour. There are no restrictions for this procedure. [facility] [facility] - 1ST FLOOR IMAGING   Follow-Up: At [facility], you and your health needs are our priority.  As part of our continuing mission to provide you with exceptional heart care, we have created designated Provider Care Teams.  These Care Teams include your primary Cardiologist (physician) and Advanced Practice Providers (APPs -  Physician Assistants and Nurse Practitioners) who all work together to provide you with the care you need, when you need it.  We recommend signing up for the patient portal called "MyChart".  Sign up information is provided on this After Visit Summary.  MyChart is used to connect with patients for Virtual Visits (Telemedicine).  Patients are able to view lab/test results, encounter notes, upcoming appointments, etc.  Non-urgent messages can be sent to your provider as well.   To learn more about what you can do with MyChart, go to [email or web address].    Your next appointment:   3 month(s)  The format for your next appointment:   In Person  Provider:   Kirk Ruths, MD      Important Information About Sugar

## 2021-09-11 LAB — HEPATIC FUNCTION PANEL
ALT: 18 IU/L (ref 0–44)
AST: 18 IU/L (ref 0–40)
Albumin: 4.2 g/dL (ref 3.7–4.7)
Alkaline Phosphatase: 67 IU/L (ref 44–121)
Bilirubin Total: 0.7 mg/dL (ref 0.0–1.2)
Bilirubin, Direct: 0.25 mg/dL (ref 0.00–0.40)
Total Protein: 6.5 g/dL (ref 6.0–8.5)

## 2021-09-11 LAB — LIPID PANEL
Chol/HDL Ratio: 3.5 ratio (ref 0.0–5.0)
Cholesterol, Total: 145 mg/dL (ref 100–199)
HDL: 42 mg/dL (ref >39)
LDL Chol Calc (NIH): 85 mg/dL (ref 0–99)
Triglycerides: 97 mg/dL (ref 0–149)
VLDL Cholesterol Cal: 18 mg/dL (ref 5–40)

## 2021-09-11 LAB — PRO B NATRIURETIC PEPTIDE: NT-Pro BNP: 7964 pg/mL — ABNORMAL HIGH (ref 0–486)

## 2021-09-12 ENCOUNTER — Ambulatory Visit (HOSPITAL_BASED_OUTPATIENT_CLINIC_OR_DEPARTMENT_OTHER)
Admission: RE | Admit: 2021-09-12 | Discharge: 2021-09-12 | Disposition: A | Discharge status: Home / Self Care | Payer: Medicare HMO | Source: Ambulatory Visit | Attending: Cardiology | Admitting: Cardiology

## 2021-09-12 DIAGNOSIS — I5022 Chronic systolic (congestive) heart failure: Secondary | ICD-10-CM | POA: Diagnosis present

## 2021-09-12 LAB — ECHOCARDIOGRAM COMPLETE
AR max vel: 1.45 cm2
AV Area VTI: 1.32 cm2
AV Area mean vel: 1.3 cm2
AV Mean grad: 5 mmHg
AV Peak grad: 8.1 mmHg
Ao pk vel: 1.42 m/s
Calc EF: 14.4 %
P 1/2 time: 467 msec
S' Lateral: 5.1 cm
Single Plane A2C EF: 11.7 %
Single Plane A4C EF: 17.6 %

## 2021-09-12 MED ORDER — PERFLUTREN LIPID MICROSPHERE
1.0000 mL | INTRAVENOUS | Status: AC | PRN
  Administered 2021-09-12: 2 mL via INTRAVENOUS

## 2021-09-12 NOTE — Addendum Note (Signed)
Addended by: Clementeen Graham E on: 09/12/2021 09:50 AM   Modules accepted: Orders

## 2021-09-12 NOTE — Progress Notes (Signed)
  Echocardiogram 2D Echocardiogram has been performed.  Dave Daniels F 09/12/2021, 4:24 PM

## 2021-09-15 ENCOUNTER — Ambulatory Visit (INDEPENDENT_AMBULATORY_CARE_PROVIDER_SITE_OTHER): Payer: Medicare HMO

## 2021-09-15 ENCOUNTER — Ambulatory Visit: Payer: Medicare HMO

## 2021-09-15 DIAGNOSIS — I255 Ischemic cardiomyopathy: Secondary | ICD-10-CM

## 2021-09-16 ENCOUNTER — Telehealth: Payer: Self-pay | Admitting: *Deleted

## 2021-09-16 DIAGNOSIS — I5022 Chronic systolic (congestive) heart failure: Secondary | ICD-10-CM

## 2021-09-16 LAB — CUP PACEART REMOTE DEVICE CHECK
Battery Remaining Longevity: 6 mo
Battery Remaining Percentage: 8 %
Battery Voltage: 2.68 V
Brady Statistic AP VP Percent: 77 %
Brady Statistic AP VS Percent: 1 %
Brady Statistic AS VP Percent: 23 %
Brady Statistic AS VS Percent: 1 %
Brady Statistic RA Percent Paced: 76 %
Date Time Interrogation Session: 20230605113458
HighPow Impedance: 57 Ohm
HighPow Impedance: 57 Ohm
Implantable Lead Implant Date: 20121219
Implantable Lead Implant Date: 20121219
Implantable Lead Implant Date: 20121219
Implantable Lead Location: 753858
Implantable Lead Location: 753859
Implantable Lead Location: 753860
Implantable Pulse Generator Implant Date: 20170508
Lead Channel Impedance Value: 300 Ohm
Lead Channel Impedance Value: 380 Ohm
Lead Channel Impedance Value: 640 Ohm
Lead Channel Pacing Threshold Amplitude: 0.625 V
Lead Channel Pacing Threshold Amplitude: 0.75 V
Lead Channel Pacing Threshold Amplitude: 2.375 V
Lead Channel Pacing Threshold Pulse Width: 0.5 ms
Lead Channel Pacing Threshold Pulse Width: 0.5 ms
Lead Channel Pacing Threshold Pulse Width: 0.6 ms
Lead Channel Sensing Intrinsic Amplitude: 12 mV
Lead Channel Sensing Intrinsic Amplitude: 2.6 mV
Lead Channel Setting Pacing Amplitude: 2 V
Lead Channel Setting Pacing Amplitude: 2 V
Lead Channel Setting Pacing Amplitude: 2.875
Lead Channel Setting Pacing Pulse Width: 0.5 ms
Lead Channel Setting Pacing Pulse Width: 0.6 ms
Lead Channel Setting Sensing Sensitivity: 2 mV
Pulse Gen Serial Number: 7306740

## 2021-09-16 MED ORDER — FUROSEMIDE 20 MG PO TABS: 40.0000 mg | ORAL_TABLET | Freq: Every day | ORAL | 3 refills | Status: DC

## 2021-09-16 NOTE — Telephone Encounter (Signed)
Spoke with pt, Aware of dr crenshaw's recommendations.  ?New script sent to the pharmacy  ?Lab orders mailed to the pt  ?

## 2021-09-16 NOTE — Telephone Encounter (Signed)
-----   Message from Lewayne Bunting, MD sent at 09/12/2021  1:42 PM EDT ----- Change lasix to 40 mg daily; bmet one week Olga Millers

## 2021-09-18 ENCOUNTER — Ambulatory Visit (INDEPENDENT_AMBULATORY_CARE_PROVIDER_SITE_OTHER): Payer: Medicare HMO

## 2021-09-18 ENCOUNTER — Telehealth: Payer: Self-pay

## 2021-09-18 DIAGNOSIS — Z95810 Presence of automatic (implantable) cardiac defibrillator: Secondary | ICD-10-CM | POA: Diagnosis not present

## 2021-09-18 DIAGNOSIS — I5022 Chronic systolic (congestive) heart failure: Secondary | ICD-10-CM

## 2021-09-18 NOTE — Progress Notes (Signed)
Returned patient call as requested by voice mail message regarding swelling in ankles.  He report swelling is improving since taking Furosemide higher dosage for past 2 days.  Advised the higher dosage needs longer than 2 days to be effective to resolve the ankle swelling.   Advise to elevate feet and call back if swelling gets worse or does not resolve within the next week.

## 2021-09-18 NOTE — Progress Notes (Signed)
EPIC Encounter for ICM Monitoring  Patient Name: Dave Daniels is a 81 y.o. male Date: 09/18/2021 Primary Care Physican: Cheron Schaumann., MD Primary Cardiologist: Jens Som Electrophysiologist: [name] Nephrologist: [facility] Nephrology [facility] 08/13/2021 Weight: 152-153 lbs Bi-V Pacing: >99%          AT/AF Burden 0% (taking Eliquis) Battery Longevity: 5.5 months                                  Spoke with patient and heart failure questions reviewed.  Pt's SOB has resolved and feeling better.     Corvue thoracic impedance suggesting possible fluid accumulation starting 6/1 and returning to normal 6/8 since taking Furosemide 40 mg daily.  Impedance also suggesting possible fluid accumulation from 5/16-5/27.   Prescribed:  Furosemide 20 mg 2 tablet(s) (40 mg total) by mouth daily (started 09/16/2021).   Potassium 20 mEq 1 tablet daily.   Labs: 08/15/2021 Creatinine 2.39, BUN 46, Potassium 4.5, Sodium 142, GFR 27 07/09/2021 Creatinine 2.66, BUN 53, Potassium 4.7, Sodium 144, GFR 24 06/20/2021 Creatinine 3.25, BUN 65, Potassium 4.9, Sodium 137, GFR 18 A complete set of results can be found in Results Review   Recommendations:  Recommendation to limit salt intake to 2000 mg daily and fluid intake to 64 oz daily.  Encouraged to call if experiencing any fluid symptoms.    Follow-up plan: ICM clinic phone appointment on 10/06/2021 to recheck fluid levels.   91 day device clinic remote transmission 10/16/2021.     EP/Cardiology Office Visits: 12/10/2021 with Dr Jens Som.      Copy of ICM check sent to Dr. Johney Frame.  3 month ICM trend: 09/18/2021.    12-14 Month ICM trend:     Karie Soda, RN 09/18/2021 8:31 AM

## 2021-09-18 NOTE — Telephone Encounter (Signed)
Spoke with patient and advised remote transmission was not received for fluid level review today.  He stated he tried to manual send transmission and will contact tech support for assistance.

## 2021-09-24 LAB — BASIC METABOLIC PANEL
BUN/Creatinine Ratio: 14 (ref 10–24)
BUN: 40 mg/dL — ABNORMAL HIGH (ref 8–27)
CO2: 25 mmol/L (ref 20–29)
Calcium: 9.7 mg/dL (ref 8.6–10.2)
Chloride: 105 mmol/L (ref 96–106)
Creatinine, Ser: 2.78 mg/dL — ABNORMAL HIGH (ref 0.76–1.27)
Glucose: 100 mg/dL — ABNORMAL HIGH (ref 70–99)
Potassium: 4.6 mmol/L (ref 3.5–5.2)
Sodium: 147 mmol/L — ABNORMAL HIGH (ref 134–144)
eGFR: 22 mL/min/{1.73_m2} — ABNORMAL LOW (ref >59)

## 2021-09-25 DIAGNOSIS — H6123 Impacted cerumen, bilateral: Secondary | ICD-10-CM | POA: Insufficient documentation

## 2021-10-02 NOTE — Progress Notes (Signed)
Remote ICD transmission.   

## 2021-10-06 ENCOUNTER — Ambulatory Visit (INDEPENDENT_AMBULATORY_CARE_PROVIDER_SITE_OTHER): Payer: Medicare HMO

## 2021-10-06 DIAGNOSIS — Z95810 Presence of automatic (implantable) cardiac defibrillator: Secondary | ICD-10-CM

## 2021-10-06 DIAGNOSIS — I5022 Chronic systolic (congestive) heart failure: Secondary | ICD-10-CM

## 2021-10-06 NOTE — Progress Notes (Signed)
EPIC Encounter for ICM Monitoring  Patient Name: Dave Daniels is a 81 y.o. male Date: 10/06/2021 Primary Care Physican: Cheron Schaumann., MD Primary Cardiologist: Jens Som Electrophysiologist: [name] Nephrologist: [facility] Nephrology [facility] 08/13/2021 Weight: 152-153 lbs Bi-V Pacing: >99%          AT/AF Burden 0% (taking Eliquis) Battery Longevity: 5.7 months                                  Spoke with wife per DPR and heart failure questions reviewed.  Pt is feeling fine.    Corvue thoracic impedance suggesting fluid levels returned to normal.   Prescribed:  Furosemide 20 mg 2 tablet(s) (40 mg total) by mouth daily (started 09/16/2021).   Potassium 20 mEq 1 tablet daily.   Labs: 08/15/2021 Creatinine 2.39, BUN 46, Potassium 4.5, Sodium 142, GFR 27 07/09/2021 Creatinine 2.66, BUN 53, Potassium 4.7, Sodium 144, GFR 24 06/20/2021 Creatinine 3.25, BUN 65, Potassium 4.9, Sodium 137, GFR 18 A complete set of results can be found in Results Review   Recommendations:  No changes and encouraged to call if experiencing any fluid symptoms.   Follow-up plan: ICM clinic phone appointment on 11/10/2021.   91 day device clinic remote transmission 10/16/2021.     EP/Cardiology Office Visits: 12/10/2021 with Dr Jens Som.      Copy of ICM check sent to Dr. Johney Frame.  3 month ICM trend: 10/06/2021.    12-14 Month ICM trend:     Karie Soda, RN 10/06/2021 12:26 PM

## 2021-10-22 ENCOUNTER — Ambulatory Visit (INDEPENDENT_AMBULATORY_CARE_PROVIDER_SITE_OTHER): Payer: Medicare HMO

## 2021-10-22 DIAGNOSIS — I255 Ischemic cardiomyopathy: Secondary | ICD-10-CM

## 2021-10-22 LAB — CUP PACEART REMOTE DEVICE CHECK
Battery Remaining Longevity: 5 mo
Battery Remaining Percentage: 7 %
Battery Voltage: 2.65 V
Brady Statistic AP VP Percent: 74 %
Brady Statistic AP VS Percent: 1 %
Brady Statistic AS VP Percent: 26 %
Brady Statistic AS VS Percent: 1 %
Brady Statistic RA Percent Paced: 74 %
Date Time Interrogation Session: 20230712040015
HighPow Impedance: 64 Ohm
HighPow Impedance: 64 Ohm
Implantable Lead Implant Date: 20121219
Implantable Lead Implant Date: 20121219
Implantable Lead Implant Date: 20121219
Implantable Lead Location: 753858
Implantable Lead Location: 753859
Implantable Lead Location: 753860
Implantable Pulse Generator Implant Date: 20170508
Lead Channel Impedance Value: 310 Ohm
Lead Channel Impedance Value: 410 Ohm
Lead Channel Impedance Value: 680 Ohm
Lead Channel Pacing Threshold Amplitude: 0.625 V
Lead Channel Pacing Threshold Amplitude: 0.875 V
Lead Channel Pacing Threshold Amplitude: 2.625 V
Lead Channel Pacing Threshold Pulse Width: 0.5 ms
Lead Channel Pacing Threshold Pulse Width: 0.5 ms
Lead Channel Pacing Threshold Pulse Width: 0.6 ms
Lead Channel Sensing Intrinsic Amplitude: 12 mV
Lead Channel Sensing Intrinsic Amplitude: 3.6 mV
Lead Channel Setting Pacing Amplitude: 2 V
Lead Channel Setting Pacing Amplitude: 2 V
Lead Channel Setting Pacing Amplitude: 3.125
Lead Channel Setting Pacing Pulse Width: 0.5 ms
Lead Channel Setting Pacing Pulse Width: 0.6 ms
Lead Channel Setting Sensing Sensitivity: 2 mV
Pulse Gen Serial Number: 7306740

## 2021-11-10 ENCOUNTER — Ambulatory Visit (INDEPENDENT_AMBULATORY_CARE_PROVIDER_SITE_OTHER): Payer: Medicare HMO

## 2021-11-10 DIAGNOSIS — Z95810 Presence of automatic (implantable) cardiac defibrillator: Secondary | ICD-10-CM | POA: Diagnosis not present

## 2021-11-10 DIAGNOSIS — I5022 Chronic systolic (congestive) heart failure: Secondary | ICD-10-CM | POA: Diagnosis not present

## 2021-11-10 NOTE — Progress Notes (Unsigned)
EPIC Encounter for ICM Monitoring  Patient Name: Dave Daniels is a 81 y.o. male Date: 11/10/2021 Primary Care Physican: Cheron Schaumann., MD Primary Cardiologist: Jens Som Electrophysiologist: [name] Nephrologist: [facility] Nephrology [facility] 08/13/2021 Weight: 152-153 lbs Bi-V Pacing: >99%          AT/AF Burden 0% (taking Eliquis) Battery Longevity: 4.6 months                                  Attempted call to patient and unable to reach.   Transmission reviewed.    Corvue thoracic impedance normal but was suggesting possible fluid accumulation from 7/23-7/29.  Prescribed:  Furosemide 20 mg 2 tablet(s) (40 mg total) by mouth daily   Potassium 20 mEq 1 tablet daily.   Labs: 10/10/2021 Creatinine 2.77, BUN 43, Potassium 4.2, Sodium 143, GFR 22 09/25/2021 Creatinine 2.78, BUN 40, Potassium 4.6, Sodium 147, GFR 22 08/15/2021 Creatinine 2.39, BUN 46, Potassium 4.5, Sodium 142, GFR 27 07/09/2021 Creatinine 2.66, BUN 53, Potassium 4.7, Sodium 144, GFR 24 06/20/2021 Creatinine 3.25, BUN 65, Potassium 4.9, Sodium 137, GFR 18 A complete set of results can be found in Results Review   Recommendations:  Unable to reach.     Follow-up plan: ICM clinic phone appointment on 12/16/2021.   91 day device clinic remote transmission 01/26/2022.     EP/Cardiology Office Visits: 12/10/2021 with Dr Jens Som.      Copy of ICM check sent to Dr. Johney Frame.  Direct Trend through 8/2:   3 month ICM trend: 11/10/2021.    12-14 Month ICM trend:     Karie Soda, RN 11/10/2021 4:56 PM

## 2021-11-11 NOTE — Progress Notes (Signed)
Remote ICD transmission.   

## 2021-11-13 ENCOUNTER — Telehealth: Payer: Self-pay

## 2021-11-13 NOTE — Telephone Encounter (Signed)
Remote ICM transmission received.  Attempted call to patient regarding ICM remote transmission and mail box is full. 

## 2021-11-19 ENCOUNTER — Ambulatory Visit: Payer: Medicare HMO

## 2021-11-19 DIAGNOSIS — I5022 Chronic systolic (congestive) heart failure: Secondary | ICD-10-CM

## 2021-11-19 DIAGNOSIS — Z95810 Presence of automatic (implantable) cardiac defibrillator: Secondary | ICD-10-CM

## 2021-11-19 NOTE — Progress Notes (Signed)
Opened in error

## 2021-11-24 ENCOUNTER — Ambulatory Visit: Payer: Medicare HMO

## 2021-11-26 LAB — CUP PACEART REMOTE DEVICE CHECK
Battery Remaining Longevity: 4 mo
Battery Remaining Percentage: 6 %
Battery Voltage: 2.63 V
Brady Statistic AP VP Percent: 74 %
Brady Statistic AP VS Percent: 1 %
Brady Statistic AS VP Percent: 26 %
Brady Statistic AS VS Percent: 1 %
Brady Statistic RA Percent Paced: 73 %
Date Time Interrogation Session: 20230815121622
HighPow Impedance: 71 Ohm
HighPow Impedance: 71 Ohm
Implantable Lead Implant Date: 20121219
Implantable Lead Implant Date: 20121219
Implantable Lead Implant Date: 20121219
Implantable Lead Location: 753858
Implantable Lead Location: 753859
Implantable Lead Location: 753860
Implantable Pulse Generator Implant Date: 20170508
Lead Channel Impedance Value: 310 Ohm
Lead Channel Impedance Value: 450 Ohm
Lead Channel Impedance Value: 680 Ohm
Lead Channel Pacing Threshold Amplitude: 0.75 V
Lead Channel Pacing Threshold Amplitude: 1.125 V
Lead Channel Pacing Threshold Amplitude: 2.75 V
Lead Channel Pacing Threshold Pulse Width: 0.5 ms
Lead Channel Pacing Threshold Pulse Width: 0.5 ms
Lead Channel Pacing Threshold Pulse Width: 0.6 ms
Lead Channel Sensing Intrinsic Amplitude: 12 mV
Lead Channel Sensing Intrinsic Amplitude: 4 mV
Lead Channel Setting Pacing Amplitude: 2 V
Lead Channel Setting Pacing Amplitude: 2.125
Lead Channel Setting Pacing Amplitude: 3.25 V
Lead Channel Setting Pacing Pulse Width: 0.5 ms
Lead Channel Setting Pacing Pulse Width: 0.6 ms
Lead Channel Setting Sensing Sensitivity: 2 mV
Pulse Gen Serial Number: 7306740

## 2021-11-26 NOTE — Progress Notes (Signed)
HPI: FU coronary artery disease status post coronary artery bypass and graft as well as ischemic cardiomyopathy. Patient's cardiac history dates back to 2010 when he had his first myocardial infarction. He had stents placed in [place]. He had a second myocardial infarction in March of 2012 and then had coronary artery bypass and graft. Cardiac catheterization was performed in August of 2012. Ejection fraction was 20%. The right coronary and LAD were occluded and there was critical circumflex disease. There was a patent saphenous vein graft to the right coronary artery. The LIMA to the LAD was patent. The saphenous vein graft to the obtuse marginal was also patent. Patient had biventricular ICD placed in December of 2012. Has had previous DCCV.  At last office visit patient complaining of increased CHF symptoms.  Follow-up echocardiogram June 2023 showed ejection fraction 20 to 25%, grade 3 diastolic dysfunction, apical thrombus, moderate right ventricular enlargement, mild RV dysfunction, severe biatrial enlargement, mild mitral regurgitation, mild to moderate tricuspid regurgitation, mild aortic insufficiency.  Since last seen, the patient has dyspnea with more extreme activities but not with routine activities. It is relieved with rest. It is not associated with chest pain. There is no orthopnea, PND or pedal edema. There is no syncope or palpitations. There is no exertional chest pain.  Approximately 3 to 4 weeks ago the patient had transient weakness in his right upper extremity.  He was seen by primary care and a CT showed no acute intracranial process.  Carotid Dopplers are pending.   Current Outpatient Medications  Medication Sig Dispense Refill   acetaminophen (TYLENOL) 500 MG tablet Take 1,000 mg by mouth as needed for moderate pain or headache.      allopurinol (ZYLOPRIM) 300 MG tablet Take 300 mg by mouth every evening. As needed  11   apixaban (ELIQUIS) 2.5 MG TABS tablet Take 1 tablet  (2.5 mg total) by mouth 2 (two) times daily. 180 tablet 3   Bempedoic Acid-Ezetimibe (NEXLIZET) 180-10 MG TABS Take 180 mg by mouth daily. 30 tablet 11   carvedilol (COREG) 12.5 MG tablet TAKE 1 TABLET BY MOUTH TWICE DAILY WITH MEALS 180 tablet 3   cholecalciferol (VITAMIN D3) 25 MCG (1000 UNIT) tablet Take 1,000 Units by mouth daily.     colchicine 0.6 MG tablet Take 0.3 mg by mouth every evening. As needed     finasteride (PROSCAR) 5 MG tablet Take 5 mg by mouth daily.     furosemide (LASIX) 20 MG tablet Take 2 tablets (40 mg total) by mouth daily. 180 tablet 3   hydrALAZINE (APRESOLINE) 10 MG tablet Take 1 tablet (10 mg total) by mouth 3 (three) times daily. 270 tablet 3   isosorbide mononitrate (IMDUR) 30 MG 24 hr tablet Take 1 tablet (30 mg total) by mouth daily. 90 tablet 3   meloxicam (MOBIC) 15 MG tablet Take 15 mg by mouth as needed for pain. For gout flare ups     potassium chloride SA (K-DUR,KLOR-CON) 20 MEQ tablet Take 1 tablet (20 mEq total) by mouth daily. 30 tablet 11   No current facility-administered medications for this visit.     Past Medical History:  Diagnosis Date   CAD (coronary artery disease)    MI in [place] with stenting in 2010, then MI with CABG in [place] 06/2010.  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   Complete heart block (HCC)    Gout    Hyperlipidemia    Hypertension    Ischemic cardiomyopathy  EF 15% by echo 11/2010 and still 20-25% by follow-up echo 02/2011, s/p St. Jude Bi-V ICD implantation 04/01/11   LBBB (left bundle branch block)    Renal insufficiency    Cr 1.6 on 03/25/11    Past Surgical History:  Procedure Laterality Date   BI-VENTRICULAR IMPLANTABLE CARDIOVERTER DEFIBRILLATOR N/A 04/01/2011   Procedure: BI-VENTRICULAR IMPLANTABLE CARDIOVERTER DEFIBRILLATOR  (CRT-D);  Surgeon: Marinus Maw, MD;  Location: [facility] CATH LAB;  Service: Cardiovascular;  Laterality: N/A;   CARDIAC DEFIBRILLATOR PLACEMENT  12/12   BiV ICD (SJM) implanted by Dr Johney Frame    CARDIOVERSION N/A 04/22/2018   Procedure: CARDIOVERSION;  Surgeon: Quintella Reichert, MD;  Location: [facility] ENDOSCOPY;  Service: Cardiovascular;  Laterality: N/A;   CARDIOVERSION N/A 06/13/2019   Procedure: CARDIOVERSION;  Surgeon: Quintella Reichert, MD;  Location: [facility] ENDOSCOPY;  Service: Cardiovascular;  Laterality: N/A;   Carpel tunnel surgery     CORONARY ARTERY BYPASS GRAFT  3/12   in [place]   EP IMPLANTABLE DEVICE N/A 08/19/2015   Procedure: BIV ICD Generator Changeout;  Surgeon: Hillis Range, MD;  Location: [facility] INVASIVE CV LAB;  Service: Cardiovascular;  Laterality: N/A;   INGUINAL HERNIA REPAIR     TONSILLECTOMY     UMBILICAL HERNIA REPAIR      Social History   Socioeconomic History   Marital status: Married    Spouse name: Not on file   Number of children: 4   Years of education: Not on file   Highest education level: Not on file  Occupational History    Comment: Retired  Tobacco Use   Smoking status: Never   Smokeless tobacco: Never  Vaping Use   Vaping Use: Never used  Substance and Sexual Activity   Alcohol use: Yes    Alcohol/week: 2.0 standard drinks of alcohol    Types: 2 Glasses of wine per week    Comment: occasional   Drug use: No   Sexual activity: Yes  Other Topics Concern   Not on file  Social History Narrative   Lives in [place], recently moved from [place].  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   Social Determinants of Corporate investment banker Strain: Not on file  Food Insecurity: Not on file  Transportation Needs: Not on file  Physical Activity: Not on file  Stress: Not on file  Social Connections: Not on file  Intimate Partner Violence: Not on file    Family History  Problem Relation Age of Onset   Pancreatic cancer Mother     ROS: no fevers or chills, productive cough, hemoptysis, dysphasia, odynophagia, melena, hematochezia, dysuria, hematuria, rash, seizure activity, orthopnea, PND, pedal edema, claudication. Remaining systems are negative.  Physical  Exam: Well-developed well-nourished in no acute distress.  Skin is warm and dry.  HEENT is normal.  Neck is supple.  Chest is clear to auscultation with normal expansion.  Cardiovascular exam is regular rate and rhythm.  Abdominal exam nontender or distended. No masses palpated. Extremities show no edema. neuro grossly intact  A/P  1 chronic systolic congestive heart failure-symptoms have improved since last office visit.  Patient not on Farxiga or spironolactone due to severity of renal insufficiency.  Continue hydralazine/nitrates.  Continue present dose of Lasix.  2 apical thrombus-continue apixaban.  3 coronary artery disease-patient denies chest pain.  Continue medical therapy.  He is not on aspirin given need for apixaban.  Intolerant to statins.  4 paroxysmal atrial fibrillation-patient is in sinus rhythm in exam.  Continue carvedilol and apixaban  at present dose.  5 hypertension-patient's blood pressure is controlled.  Continue present medical regimen.  6 ischemic cardiomyopathy-continue carvedilol, hydralazine (increased to 25 mg p.o. 3 times daily) and nitrates.  Delene Loll was discontinued previously by his nephrologist as his renal function was worsening.  7 hyperlipidemia-continue bempedoic acid and Zetia.  He is intolerant to statins.  8 status post CRT-D-managed by electrophysiology.  Kirk Ruths, MD

## 2021-12-10 ENCOUNTER — Encounter: Payer: Self-pay | Admitting: Cardiology

## 2021-12-10 ENCOUNTER — Ambulatory Visit: Payer: Medicare HMO | Attending: Cardiology | Admitting: Cardiology

## 2021-12-10 VITALS — BP 130/66 | HR 60 | Ht 67.0 in | Wt 150.4 lb

## 2021-12-10 DIAGNOSIS — I5022 Chronic systolic (congestive) heart failure: Secondary | ICD-10-CM

## 2021-12-10 DIAGNOSIS — E7800 Pure hypercholesterolemia, unspecified: Secondary | ICD-10-CM

## 2021-12-10 DIAGNOSIS — I480 Paroxysmal atrial fibrillation: Secondary | ICD-10-CM

## 2021-12-10 DIAGNOSIS — I2510 Atherosclerotic heart disease of native coronary artery without angina pectoris: Secondary | ICD-10-CM | POA: Diagnosis not present

## 2021-12-10 DIAGNOSIS — I255 Ischemic cardiomyopathy: Secondary | ICD-10-CM

## 2021-12-10 MED ORDER — HYDRALAZINE HCL 25 MG PO TABS: 25.0000 mg | ORAL_TABLET | Freq: Three times a day (TID) | ORAL | Status: DC

## 2021-12-10 NOTE — Patient Instructions (Signed)
Medication Instructions:   INCREASE HYDRALAZINE TO 20 MG THREE TIMES A DAY UNTIL CURRENT SUPPLY GONE-LET ME KNOW IF TOLERATE AND WILL SEND NEW PRESCRIPTION  *If you need a refill on your cardiac medications before your next appointment, please call your pharmacy*   Follow-Up: At [facility], you and your health needs are our priority.  As part of our continuing mission to provide you with exceptional heart care, we have created designated Provider Care Teams.  These Care Teams include your primary Cardiologist (physician) and Advanced Practice Providers (APPs -  Physician Assistants and Nurse Practitioners) who all work together to provide you with the care you need, when you need it.  We recommend signing up for the patient portal called "MyChart".  Sign up information is provided on this After Visit Summary.  MyChart is used to connect with patients for Virtual Visits (Telemedicine).  Patients are able to view lab/test results, encounter notes, upcoming appointments, etc.  Non-urgent messages can be sent to your provider as well.   To learn more about what you can do with MyChart, go to [email or web address].    Your next appointment:   6 month(s)  The format for your next appointment:   In Person  Provider:   Olga Millers, MD

## 2021-12-16 ENCOUNTER — Ambulatory Visit (INDEPENDENT_AMBULATORY_CARE_PROVIDER_SITE_OTHER): Payer: Medicare HMO

## 2021-12-16 DIAGNOSIS — Z95810 Presence of automatic (implantable) cardiac defibrillator: Secondary | ICD-10-CM | POA: Diagnosis not present

## 2021-12-16 DIAGNOSIS — I5022 Chronic systolic (congestive) heart failure: Secondary | ICD-10-CM

## 2021-12-17 ENCOUNTER — Telehealth: Payer: Self-pay | Admitting: Cardiology

## 2021-12-17 NOTE — Telephone Encounter (Signed)
Patient came in stating that at his last appointment one of his medications was upped dosage wise and he can't remember which medication it was. He said he's having some adverse side effects and wanted to speak with you if possible. CB # [phone]

## 2021-12-17 NOTE — Telephone Encounter (Signed)
Returned call to patient-patient reports after increasing hydralazine at last appointment (increased hydralazine to 25 mg TID) he started having headaches and "inflammation" in his wrist and 2 fingers.   Reports symptoms started 2 days after increasing dose.   He went back to original dose of 10 mg TID and symptoms resolved.      Advised would route to MD to make aware.

## 2021-12-17 NOTE — Telephone Encounter (Signed)
Spoke with pt, Aware of dr crenshaw's recommendations.  °

## 2021-12-18 ENCOUNTER — Telehealth: Payer: Self-pay

## 2021-12-18 NOTE — Telephone Encounter (Signed)
Remote ICM transmission received.  Attempted call to patient regarding ICM remote transmission and no answer or voice mail option.  

## 2021-12-18 NOTE — Progress Notes (Signed)
EPIC Encounter for ICM Monitoring  Patient Name: Dave Daniels is a 81 y.o. male Date: 12/18/2021 Primary Care Physican: Cheron Schaumann., MD Primary Cardiologist: Jens Som Electrophysiologist: Elberta Fortis Nephrologist: [facility] 08/13/2021 Weight: 152-153 lbs Bi-V Pacing: >99%          AT/AF Burden 0% (taking Eliquis) Battery Longevity: 3.4 months                                  Attempted call to patient and unable to reach.   Transmission reviewed.    Corvue thoracic impedance normal but was suggesting possible fluid accumulation from 8/16-8/26.   Prescribed:  Furosemide 20 mg 2 tablet(s) (40 mg total) by mouth daily   Potassium 20 mEq 1 tablet daily.   Labs: 10/10/2021 Creatinine 2.77, BUN 43, Potassium 4.2, Sodium 143, GFR 22 09/25/2021 Creatinine 2.78, BUN 40, Potassium 4.6, Sodium 147, GFR 22 08/15/2021 Creatinine 2.39, BUN 46, Potassium 4.5, Sodium 142, GFR 27 07/09/2021 Creatinine 2.66, BUN 53, Potassium 4.7, Sodium 144, GFR 24 06/20/2021 Creatinine 3.25, BUN 65, Potassium 4.9, Sodium 137, GFR 18 A complete set of results can be found in Results Review   Recommendations:  Unable to reach.     Follow-up plan: ICM clinic phone appointment on 01/27/2022.   91 day device clinic remote transmission 01/26/2022.     EP/Cardiology Office Visits: 06/17/2022 with Dr Jens Som.      Copy of ICM check sent to Dr. Elberta Fortis.  3 month ICM trend: 12/16/2021.    12-14 Month ICM trend:     Karie Soda, RN 12/18/2021 9:26 AM

## 2021-12-25 ENCOUNTER — Ambulatory Visit (INDEPENDENT_AMBULATORY_CARE_PROVIDER_SITE_OTHER): Payer: Medicare HMO

## 2021-12-25 DIAGNOSIS — Z95810 Presence of automatic (implantable) cardiac defibrillator: Secondary | ICD-10-CM | POA: Diagnosis not present

## 2021-12-25 LAB — CUP PACEART REMOTE DEVICE CHECK
Battery Remaining Longevity: 3 mo
Battery Remaining Percentage: 4 %
Battery Voltage: 2.62 V
Brady Statistic AP VP Percent: 74 %
Brady Statistic AP VS Percent: 1 %
Brady Statistic AS VP Percent: 26 %
Brady Statistic AS VS Percent: 1 %
Brady Statistic RA Percent Paced: 74 %
Date Time Interrogation Session: 20230912081417
HighPow Impedance: 65 Ohm
HighPow Impedance: 65 Ohm
Implantable Lead Implant Date: 20121219
Implantable Lead Implant Date: 20121219
Implantable Lead Implant Date: 20121219
Implantable Lead Location: 753858
Implantable Lead Location: 753859
Implantable Lead Location: 753860
Implantable Pulse Generator Implant Date: 20170508
Lead Channel Impedance Value: 340 Ohm
Lead Channel Impedance Value: 410 Ohm
Lead Channel Impedance Value: 710 Ohm
Lead Channel Pacing Threshold Amplitude: 0.625 V
Lead Channel Pacing Threshold Amplitude: 1 V
Lead Channel Pacing Threshold Amplitude: 2.75 V
Lead Channel Pacing Threshold Pulse Width: 0.5 ms
Lead Channel Pacing Threshold Pulse Width: 0.5 ms
Lead Channel Pacing Threshold Pulse Width: 0.6 ms
Lead Channel Sensing Intrinsic Amplitude: 12 mV
Lead Channel Sensing Intrinsic Amplitude: 4.1 mV
Lead Channel Setting Pacing Amplitude: 2 V
Lead Channel Setting Pacing Amplitude: 2 V
Lead Channel Setting Pacing Amplitude: 3.25 V
Lead Channel Setting Pacing Pulse Width: 0.5 ms
Lead Channel Setting Pacing Pulse Width: 0.6 ms
Lead Channel Setting Sensing Sensitivity: 2 mV
Pulse Gen Serial Number: 7306740

## 2022-01-07 NOTE — Progress Notes (Signed)
Remote ICD transmission.   

## 2022-01-26 ENCOUNTER — Ambulatory Visit (INDEPENDENT_AMBULATORY_CARE_PROVIDER_SITE_OTHER): Payer: Medicare HMO

## 2022-01-26 DIAGNOSIS — I5022 Chronic systolic (congestive) heart failure: Secondary | ICD-10-CM

## 2022-01-27 ENCOUNTER — Ambulatory Visit (INDEPENDENT_AMBULATORY_CARE_PROVIDER_SITE_OTHER): Payer: Medicare HMO

## 2022-01-27 DIAGNOSIS — I5022 Chronic systolic (congestive) heart failure: Secondary | ICD-10-CM

## 2022-01-27 DIAGNOSIS — Z95810 Presence of automatic (implantable) cardiac defibrillator: Secondary | ICD-10-CM

## 2022-01-27 LAB — CUP PACEART REMOTE DEVICE CHECK
Battery Remaining Longevity: 1 mo
Battery Remaining Percentage: 3 %
Battery Voltage: 2.6 V
Brady Statistic AP VP Percent: 73 %
Brady Statistic AP VS Percent: 1 %
Brady Statistic AS VP Percent: 26 %
Brady Statistic AS VS Percent: 1 %
Brady Statistic RA Percent Paced: 73 %
Date Time Interrogation Session: 20231017020036
HighPow Impedance: 61 Ohm
HighPow Impedance: 61 Ohm
Implantable Lead Implant Date: 20121219
Implantable Lead Implant Date: 20121219
Implantable Lead Implant Date: 20121219
Implantable Lead Location: 753858
Implantable Lead Location: 753859
Implantable Lead Location: 753860
Implantable Pulse Generator Implant Date: 20170508
Lead Channel Impedance Value: 300 Ohm
Lead Channel Impedance Value: 390 Ohm
Lead Channel Impedance Value: 660 Ohm
Lead Channel Pacing Threshold Amplitude: 0.625 V
Lead Channel Pacing Threshold Amplitude: 0.75 V
Lead Channel Pacing Threshold Amplitude: 2.375 V
Lead Channel Pacing Threshold Pulse Width: 0.5 ms
Lead Channel Pacing Threshold Pulse Width: 0.5 ms
Lead Channel Pacing Threshold Pulse Width: 0.6 ms
Lead Channel Sensing Intrinsic Amplitude: 12 mV
Lead Channel Sensing Intrinsic Amplitude: 3.4 mV
Lead Channel Setting Pacing Amplitude: 2 V
Lead Channel Setting Pacing Amplitude: 2 V
Lead Channel Setting Pacing Amplitude: 2.875
Lead Channel Setting Pacing Pulse Width: 0.5 ms
Lead Channel Setting Pacing Pulse Width: 0.6 ms
Lead Channel Setting Sensing Sensitivity: 2 mV
Pulse Gen Serial Number: 7306740

## 2022-02-03 NOTE — Progress Notes (Signed)
EPIC Encounter for ICM Monitoring  Patient Name: Dave Daniels is a 81 y.o. male Date: 02/03/2022 Primary Care Physican: Loraine Leriche., MD Primary Cardiologist: Stanford Breed Electrophysiologist: Curt Bears Nephrologist: [facility] [place] 08/13/2021 Weight: 152-153 lbs 02/03/2022 Weight: 152 lbs Bi-V Pacing: >99%          AT/AF Burden 0% (taking Eliquis) Battery Longevity: <3 months                                  Spoke with patient and heart failure questions reviewed.  Transmission results reviewed.  Pt asymptomatic for fluid accumulation.  He did miss several doses of Furosemide during decreased impedance and may be eating too much salt.    Corvue thoracic impedance normal but was suggesting possible fluid accumulation from 9/23-10/18.   Prescribed:  Furosemide 20 mg 2 tablet(s) (40 mg total) by mouth daily   Potassium 20 mEq 1 tablet daily.   Labs: 10/10/2021 Creatinine 2.77, BUN 43, Potassium 4.2, Sodium 143, GFR 22 09/25/2021 Creatinine 2.78, BUN 40, Potassium 4.6, Sodium 147, GFR 22 08/15/2021 Creatinine 2.39, BUN 46, Potassium 4.5, Sodium 142, GFR 27 07/09/2021 Creatinine 2.66, BUN 53, Potassium 4.7, Sodium 144, GFR 24 06/20/2021 Creatinine 3.25, BUN 65, Potassium 4.9, Sodium 137, GFR 18 A complete set of results can be found in Results Review   Recommendations:  Recommendation to limit salt intake to 2000 mg daily and fluid intake to 64 oz daily.  Encouraged to call if experiencing any fluid symptoms.   Advised not to miss any medication dosages.    Follow-up plan: ICM clinic phone appointment on 03/02/2022.   91 day device clinic remote transmission 04/29/2022.     EP/Cardiology Office Visits: 06/17/2022 with Dr Stanford Breed.      Copy of ICM check sent to Dr. Curt Bears.   3 month ICM trend: 01/26/2022.    12-14 Month ICM trend:     Rosalene Billings, RN 02/03/2022 12:07 PM

## 2022-02-16 ENCOUNTER — Other Ambulatory Visit: Payer: Self-pay | Admitting: Student

## 2022-02-17 NOTE — Progress Notes (Signed)
Remote ICD transmission.   

## 2022-02-26 ENCOUNTER — Ambulatory Visit (INDEPENDENT_AMBULATORY_CARE_PROVIDER_SITE_OTHER): Payer: Medicare HMO

## 2022-02-26 DIAGNOSIS — I255 Ischemic cardiomyopathy: Secondary | ICD-10-CM

## 2022-02-26 LAB — CUP PACEART REMOTE DEVICE CHECK
Battery Remaining Longevity: 0 mo
Battery Voltage: 2.59 V
Brady Statistic AP VP Percent: 73 %
Brady Statistic AP VS Percent: 1 %
Brady Statistic AS VP Percent: 27 %
Brady Statistic AS VS Percent: 1 %
Brady Statistic RA Percent Paced: 73 %
Date Time Interrogation Session: 20231116020018
HighPow Impedance: 64 Ohm
HighPow Impedance: 64 Ohm
Implantable Lead Connection Status: 753985
Implantable Lead Connection Status: 753985
Implantable Lead Connection Status: 753985
Implantable Lead Implant Date: 20121219
Implantable Lead Implant Date: 20121219
Implantable Lead Implant Date: 20121219
Implantable Lead Location: 753858
Implantable Lead Location: 753859
Implantable Lead Location: 753860
Implantable Pulse Generator Implant Date: 20170508
Lead Channel Impedance Value: 310 Ohm
Lead Channel Impedance Value: 430 Ohm
Lead Channel Impedance Value: 710 Ohm
Lead Channel Pacing Threshold Amplitude: 0.75 V
Lead Channel Pacing Threshold Amplitude: 0.875 V
Lead Channel Pacing Threshold Amplitude: 2.75 V
Lead Channel Pacing Threshold Pulse Width: 0.5 ms
Lead Channel Pacing Threshold Pulse Width: 0.5 ms
Lead Channel Pacing Threshold Pulse Width: 0.6 ms
Lead Channel Sensing Intrinsic Amplitude: 12 mV
Lead Channel Sensing Intrinsic Amplitude: 3.7 mV
Lead Channel Setting Pacing Amplitude: 2 V
Lead Channel Setting Pacing Amplitude: 2 V
Lead Channel Setting Pacing Amplitude: 3.25 V
Lead Channel Setting Pacing Pulse Width: 0.5 ms
Lead Channel Setting Pacing Pulse Width: 0.6 ms
Lead Channel Setting Sensing Sensitivity: 2 mV
Pulse Gen Serial Number: 7306740

## 2022-03-02 ENCOUNTER — Ambulatory Visit (INDEPENDENT_AMBULATORY_CARE_PROVIDER_SITE_OTHER): Payer: Medicare HMO

## 2022-03-02 DIAGNOSIS — Z95810 Presence of automatic (implantable) cardiac defibrillator: Secondary | ICD-10-CM | POA: Diagnosis not present

## 2022-03-02 DIAGNOSIS — I5022 Chronic systolic (congestive) heart failure: Secondary | ICD-10-CM

## 2022-03-04 ENCOUNTER — Telehealth: Payer: Self-pay

## 2022-03-04 NOTE — Progress Notes (Signed)
EPIC Encounter for ICM Monitoring  Patient Name: Dave Daniels is a 81 y.o. male Date: 03/04/2022 Primary Care Physican: Cheron Schaumann., MD Primary Cardiologist: Jens Som Electrophysiologist: Elberta Fortis Nephrologist: [facility] 08/13/2021 Weight: 152-153 lbs 02/03/2022 Weight: 152 lbs 03/04/2022 Weight: 150-152 lbs Bi-V Pacing: >99%          AT/AF Burden 0% (taking Eliquis) Battery Longevity: ERI reached 11/10                                  Spoke with patient and heart failure questions reviewed.  Transmission results reviewed.  Pt asymptomatic for fluid accumulation.   Corvue thoracic impedance normal.   Prescribed:  Furosemide 20 mg 2 tablet(s) (40 mg total) by mouth daily   Potassium 20 mEq 1 tablet daily.   Labs: 10/10/2021 Creatinine 2.77, BUN 43, Potassium 4.2, Sodium 143, GFR 22 09/25/2021 Creatinine 2.78, BUN 40, Potassium 4.6, Sodium 147, GFR 22 08/15/2021 Creatinine 2.39, BUN 46, Potassium 4.5, Sodium 142, GFR 27 07/09/2021 Creatinine 2.66, BUN 53, Potassium 4.7, Sodium 144, GFR 24 06/20/2021 Creatinine 3.25, BUN 65, Potassium 4.9, Sodium 137, GFR 18 A complete set of results can be found in Results Review   Recommendations:  No changes and encouraged to call if experiencing any fluid symptoms.   Follow-up plan: ICM clinic phone appointment on 04/20/2022.   91 day device clinic remote transmission 04/29/2022.     EP/Cardiology Office Visits: 06/17/2022 with Dr Jens Som.      Copy of ICM check sent to Dr. Elberta Fortis.    3 month ICM trend: 03/02/2022.    12-14 Month ICM trend:     Karie Soda, RN 03/04/2022 1:23 PM

## 2022-03-04 NOTE — Telephone Encounter (Signed)
Notified by [facility] clinic that Pt had reached ERI 02/20/22.  Spoke with Pt.  Notified of ERI.  Follow up scheduled with Dr. Elberta Fortis to discuss gen change-former [name] Pt.

## 2022-03-19 NOTE — Progress Notes (Signed)
Remote ICD transmission.   

## 2022-03-19 NOTE — Telephone Encounter (Signed)
Aware we will discuss gen change date when he comes in to see [place] next week. Pt agreeable

## 2022-03-24 ENCOUNTER — Encounter: Payer: Self-pay | Admitting: *Deleted

## 2022-03-24 ENCOUNTER — Ambulatory Visit: Payer: Medicare HMO | Attending: Cardiology | Admitting: Cardiology

## 2022-03-24 ENCOUNTER — Encounter: Payer: Self-pay | Admitting: Cardiology

## 2022-03-24 VITALS — BP 102/60 | HR 60 | Ht 67.0 in | Wt 151.2 lb

## 2022-03-24 DIAGNOSIS — D6869 Other thrombophilia: Secondary | ICD-10-CM

## 2022-03-24 DIAGNOSIS — Z01812 Encounter for preprocedural laboratory examination: Secondary | ICD-10-CM

## 2022-03-24 DIAGNOSIS — I480 Paroxysmal atrial fibrillation: Secondary | ICD-10-CM | POA: Diagnosis not present

## 2022-03-24 DIAGNOSIS — I2510 Atherosclerotic heart disease of native coronary artery without angina pectoris: Secondary | ICD-10-CM

## 2022-03-24 DIAGNOSIS — I5022 Chronic systolic (congestive) heart failure: Secondary | ICD-10-CM

## 2022-03-24 DIAGNOSIS — I255 Ischemic cardiomyopathy: Secondary | ICD-10-CM

## 2022-03-24 NOTE — Patient Instructions (Addendum)
Medication Instructions:  Your physician recommends that you continue on your current medications as directed. Please refer to the Current Medication list given to you today.  *If you need a refill on your cardiac medications before your next appointment, please call your pharmacy*   Lab Work: None ordered If you have labs (blood work) drawn today and your tests are completely normal, you will receive your results only by: MyChart Message (if you have MyChart) OR A paper copy in the mail If you have any lab test that is abnormal or we need to change your treatment, we will call you to review the results.   Testing/Procedures: Your physician recommends a defibrillator battery change. You are scheduled for 05/12/2022.  Please see instruction letter given to you today.   Follow-Up: At [facility], you and your health needs are our priority.  As part of our continuing mission to provide you with exceptional heart care, we have created designated Provider Care Teams.  These Care Teams include your primary Cardiologist (physician) and Advanced Practice Providers (APPs -  Physician Assistants and Nurse Practitioners) who all work together to provide you with the care you need, when you need it.  Your next appointment:   2 week(s) after your battery change  The format for your next appointment:   In Person  Provider:   Device clinic for a wound check     Thank you for choosing [facility]!!   Dory Horn, RN [phone]  Other Instructions    Important Information About Sugar

## 2022-03-24 NOTE — Progress Notes (Signed)
Electrophysiology Office Note   Date:  03/24/2022   ID:  [name], [name] 12/25/1940, MRN [id number]  PCP:  Cheron Schaumann., MD  Cardiologist:  Jens Som Primary Electrophysiologist:  Daphene Chisholm Jorja Loa, MD    Chief Complaint: ICD   History of Present Illness: Dave Daniels is a 81 y.o. male who is being seen today for the evaluation of ICD at the request of Alwyn Ren, Gretchen Y.,*. Presenting today for electrophysiology evaluation.  Reason for coronary artery disease post CABG in 2012, hypertension, hyperlipidemia, chronic systolic heart failure due to ischemic cardiomyopathy, left bundle branch block, atrial fibrillation.  He is status post Abbott CRT-D implanted 04/01/2011 with generator change 08/19/2015.  Today, he denies symptoms of palpitations, chest pain, shortness of breath, orthopnea, PND, lower extremity edema, claudication, dizziness, presyncope, syncope, bleeding, or neurologic sequela. The patient is tolerating medications without difficulties.    Past Medical History:  Diagnosis Date   CAD (coronary artery disease)    MI in [place] with stenting in 2010, then MI with CABG in [place] 06/2010.  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   Complete heart block (HCC)    Gout    Hyperlipidemia    Hypertension    Ischemic cardiomyopathy    EF 15% by echo 11/2010 and still 20-25% by follow-up echo 02/2011, s/p St. Jude Bi-V ICD implantation 04/01/11   LBBB (left bundle branch block)    Renal insufficiency    Cr 1.6 on 03/25/11   Past Surgical History:  Procedure Laterality Date   BI-VENTRICULAR IMPLANTABLE CARDIOVERTER DEFIBRILLATOR N/A 04/01/2011   Procedure: BI-VENTRICULAR IMPLANTABLE CARDIOVERTER DEFIBRILLATOR  (CRT-D);  Surgeon: Marinus Maw, MD;  Location[facility] CATH LAB;  Service: Cardiovascular;  Laterality: N/A;   CARDIAC DEFIBRILLATOR PLACEMENT  12/12   BiV ICD (SJM) implanted by Dr Johney Frame   CARDIOVERSION N/A 04/22/2018   Procedure: CARDIOVERSION;  Surgeon:  Quintella Reichert, MD;  Location: [facility] ENDOSCOPY;  Service: Cardiovascular;  Laterality: N/A;   CARDIOVERSION N/A 06/13/2019   Procedure: CARDIOVERSION;  Surgeon: Quintella Reichert, MD;  Location: [facility] ENDOSCOPY;  Service: Cardiovascular;  Laterality: N/A;   Carpel tunnel surgery     CORONARY ARTERY BYPASS GRAFT  3/12   in [place]   EP IMPLANTABLE DEVICE N/A 08/19/2015   Procedure: BIV ICD Generator Changeout;  Surgeon: Hillis Range, MD;  Location: [facility] INVASIVE CV LAB;  Service: Cardiovascular;  Laterality: N/A;   INGUINAL HERNIA REPAIR     TONSILLECTOMY     UMBILICAL HERNIA REPAIR       Current Outpatient Medications  Medication Sig Dispense Refill   acetaminophen (TYLENOL) 500 MG tablet Take 1,000 mg by mouth as needed for moderate pain or headache.      allopurinol (ZYLOPRIM) 300 MG tablet Take 300 mg by mouth every evening. As needed  11   apixaban (ELIQUIS) 2.5 MG TABS tablet Take 1 tablet (2.5 mg total) by mouth 2 (two) times daily. 180 tablet 3   Bempedoic Acid-Ezetimibe (NEXLIZET) 180-10 MG TABS Take 180 mg by mouth daily. 30 tablet 11   carvedilol (COREG) 12.5 MG tablet Take 1 tablet (12.5 mg total) by mouth 2 (two) times daily with a meal. 180 tablet 2   cholecalciferol (VITAMIN D3) 25 MCG (1000 UNIT) tablet Take 1,000 Units by mouth daily.     colchicine 0.6 MG tablet Take 0.3 mg by mouth every evening. As needed     finasteride (PROSCAR) 5 MG tablet Take 5 mg by mouth daily.  furosemide (LASIX) 20 MG tablet Take 2 tablets (40 mg total) by mouth daily. 180 tablet 3   hydrALAZINE (APRESOLINE) 10 MG tablet Take 10 mg by mouth 3 (three) times daily.     isosorbide mononitrate (IMDUR) 30 MG 24 hr tablet Take 1 tablet (30 mg total) by mouth daily. 90 tablet 3   meloxicam (MOBIC) 15 MG tablet Take 15 mg by mouth as needed for pain. For gout flare ups     tamsulosin (FLOMAX) 0.4 MG CAPS capsule Take 0.4 mg by mouth daily.     potassium chloride SA (K-DUR,KLOR-CON) 20 MEQ tablet Take 1 tablet (20  mEq total) by mouth daily. 30 tablet 11   No current facility-administered medications for this visit.    Allergies:   Statins, Nsaids, Tolmetin, Atorvastatin, and Pravastatin   Social History:  The patient  reports that he has never smoked. He has never used smokeless tobacco. He reports current alcohol use of about 2.0 standard drinks of alcohol per week. He reports that he does not use drugs.   Family History:  The patient's family history includes Pancreatic cancer in his mother.    ROS:  Please see the history of present illness.   Otherwise, review of systems is positive for none.   All other systems are reviewed and negative.    PHYSICAL EXAM: VS:  BP 102/60   Pulse 60   Ht 5\' 7"  (1.702 m)   Wt 151 lb 3.2 oz (68.6 kg)   SpO2 98%   BMI 23.68 kg/m  , BMI Body mass index is 23.68 kg/m. GEN: Well nourished, well developed, in no acute distress  HEENT: normal  Neck: no JVD, carotid bruits, or masses Cardiac: RRR; no murmurs, rubs, or gallops,no edema  Respiratory:  clear to auscultation bilaterally, normal work of breathing GI: soft, nontender, nondistended, + BS MS: no deformity or atrophy  Skin: warm and dry, device pocket is well healed Neuro:  Strength and sensation are intact Psych: euthymic mood, full affect  EKG:  EKG is ordered today. Personal review of the ekg ordered shows atrial paced, rate 50, inferior infarct  Device interrogation is reviewed today in detail.  See PaceArt for details.   Recent Labs: 08/15/2021: Hemoglobin 12.9; Platelets 199 09/10/2021: ALT 18; NT-Pro BNP 7,964 09/23/2021: BUN 40; Creatinine, Ser 2.78; Potassium 4.6; Sodium 147    Lipid Panel     Component Value Date/Time   CHOL 145 09/10/2021 1124   TRIG 97 09/10/2021 1124   HDL 42 09/10/2021 1124   CHOLHDL 3.5 09/10/2021 1124   CHOLHDL 3.7 07/13/2012 1434   VLDL 20 07/13/2012 1434   LDLCALC 85 09/10/2021 1124     Wt Readings from Last 3 Encounters:  03/24/22 151 lb 3.2 oz  (68.6 kg)  12/10/21 150 lb 6.4 oz (68.2 kg)  09/10/21 153 lb (69.4 kg)      Other studies Reviewed: Additional studies/ records that were reviewed today include: TTE 09/12/21  Review of the above records today demonstrates:   1. Apical LV thrombus is present. Left ventricular ejection fraction, by  estimation, is 20 to 25%. The left ventricle has severely decreased  function. The left ventricle demonstrates regional wall motion  abnormalities (see scoring diagram/findings for  description). Left ventricular diastolic parameters are consistent with  Grade III diastolic dysfunction (restrictive). Elevated left atrial  pressure.   2. Right ventricular systolic function is mildly reduced. The right  ventricular size is moderately enlarged. There is moderately elevated  pulmonary  artery systolic pressure.   3. Left atrial size was severely dilated.   4. Right atrial size was severely dilated.   5. The mitral valve is normal in structure. Mild mitral valve  regurgitation. No evidence of mitral stenosis.   6. Tricuspid valve regurgitation is mild to moderate.   7. The aortic valve is tricuspid. Aortic valve regurgitation is mild.  Aortic valve sclerosis/calcification is present, without any evidence of  aortic stenosis.   8. The inferior vena cava is normal in size with <50% respiratory  variability, suggesting right atrial pressure of 8 mmHg.    ASSESSMENT AND PLAN:  1.  Chronic systolic heart failure: Due to ischemic cardiomyopathy.  Currently on optimal medical therapy per primary cardiology.  Is status post Abbott CRT-D.  Device at [facility] and Terique Kawabata need generator change.  Risk and benefits were discussed.  Risk include bleeding, infection, tamponade, pneumothorax, lead dislodgment.  He understands these risks and is agreed to the procedure.  2.  Paroxysmal atrial fibrillation: CHA2DS2-VASc of 4.  Currently on Eliquis, appropriately dosed as above.  3.  Coronary artery disease: Status post  CABG.  No anginal symptoms.  No changes.  4.  Secondary hypercoagulable state: Currently on Eliquis for atrial fibrillation as above    Current medicines are reviewed at length with the patient today.   The patient does not have concerns regarding his medicines.  The following changes were made today:  none  Labs/ tests ordered today include:  No orders of the defined types were placed in this encounter.    Disposition:   FU with Tiea Manninen 3 months  Signed, Jahzeel Poythress Jorja Loa, MD  03/24/2022 5:06 PM     [facility] [address] [place] [address] [phone] (office) [phone] (fax)

## 2022-03-26 NOTE — Addendum Note (Signed)
Addended by: Valora Corporal on: 03/26/2022 07:38 AM   Modules accepted: Orders

## 2022-03-30 ENCOUNTER — Ambulatory Visit (INDEPENDENT_AMBULATORY_CARE_PROVIDER_SITE_OTHER): Payer: Medicare HMO

## 2022-03-30 DIAGNOSIS — I255 Ischemic cardiomyopathy: Secondary | ICD-10-CM

## 2022-03-30 DIAGNOSIS — I5022 Chronic systolic (congestive) heart failure: Secondary | ICD-10-CM

## 2022-03-31 LAB — CUP PACEART REMOTE DEVICE CHECK
Battery Remaining Longevity: 0 mo
Battery Voltage: 2.57 V
Brady Statistic AP VP Percent: 68 %
Brady Statistic AP VS Percent: 1 %
Brady Statistic AS VP Percent: 32 %
Brady Statistic AS VS Percent: 1 %
Brady Statistic RA Percent Paced: 67 %
Date Time Interrogation Session: 20231218034800
HighPow Impedance: 63 Ohm
HighPow Impedance: 63 Ohm
Implantable Lead Connection Status: 753985
Implantable Lead Connection Status: 753985
Implantable Lead Connection Status: 753985
Implantable Lead Implant Date: 20121219
Implantable Lead Implant Date: 20121219
Implantable Lead Implant Date: 20121219
Implantable Lead Location: 753858
Implantable Lead Location: 753859
Implantable Lead Location: 753860
Implantable Pulse Generator Implant Date: 20170508
Lead Channel Impedance Value: 300 Ohm
Lead Channel Impedance Value: 410 Ohm
Lead Channel Impedance Value: 660 Ohm
Lead Channel Pacing Threshold Amplitude: 0.625 V
Lead Channel Pacing Threshold Amplitude: 0.875 V
Lead Channel Pacing Threshold Amplitude: 2.625 V
Lead Channel Pacing Threshold Pulse Width: 0.5 ms
Lead Channel Pacing Threshold Pulse Width: 0.5 ms
Lead Channel Pacing Threshold Pulse Width: 0.6 ms
Lead Channel Sensing Intrinsic Amplitude: 12 mV
Lead Channel Sensing Intrinsic Amplitude: 3.7 mV
Lead Channel Setting Pacing Amplitude: 2 V
Lead Channel Setting Pacing Amplitude: 2 V
Lead Channel Setting Pacing Amplitude: 3.125
Lead Channel Setting Pacing Pulse Width: 0.5 ms
Lead Channel Setting Pacing Pulse Width: 0.6 ms
Lead Channel Setting Sensing Sensitivity: 2 mV
Pulse Gen Serial Number: 7306740

## 2022-04-15 ENCOUNTER — Other Ambulatory Visit: Payer: Self-pay

## 2022-04-15 ENCOUNTER — Other Ambulatory Visit: Payer: Self-pay | Admitting: *Deleted

## 2022-04-15 ENCOUNTER — Telehealth: Payer: Self-pay

## 2022-04-15 DIAGNOSIS — I480 Paroxysmal atrial fibrillation: Secondary | ICD-10-CM

## 2022-04-15 MED ORDER — APIXABAN 2.5 MG PO TABS: 2.5000 mg | ORAL_TABLET | Freq: Two times a day (BID) | ORAL | 3 refills | Status: DC

## 2022-04-15 NOTE — Telephone Encounter (Signed)
Work up is complete. Instruction letter mailed to pt.

## 2022-04-20 ENCOUNTER — Ambulatory Visit (INDEPENDENT_AMBULATORY_CARE_PROVIDER_SITE_OTHER): Payer: Medicare HMO

## 2022-04-20 DIAGNOSIS — Z95810 Presence of automatic (implantable) cardiac defibrillator: Secondary | ICD-10-CM | POA: Diagnosis not present

## 2022-04-20 DIAGNOSIS — I5022 Chronic systolic (congestive) heart failure: Secondary | ICD-10-CM

## 2022-04-23 NOTE — Progress Notes (Signed)
EPIC Encounter for ICM Monitoring  Patient Name: Dave Daniels is a 82 y.o. male Date: 04/23/2022 Primary Care Physican: Loraine Leriche., MD Primary Cardiologist: Stanford Breed Electrophysiologist: Curt Bears Nephrologist: [facility] Bi-V Pacing: >99%    08/13/2021 Weight: 152-153 lbs 02/03/2022 Weight: 152 lbs 03/04/2022 Weight: 150-152 lbs        AT/AF Burden <1% (taking Eliquis)                                   Spoke with patient and heart failure questions reviewed.  Transmission results reviewed.  Pt asymptomatic for fluid accumulation.  Reports feeling well at this time and voices no complaints.  .  ICD change out 05/12/2022.   Corvue thoracic impedance suggesting normal fluid levels.   Prescribed:  Furosemide 20 mg 2 tablet(s) (40 mg total) by mouth daily   Potassium 20 mEq 1 tablet daily.   Labs: 05/04/2022 BMET scheduled 02/16/2022 Creatinine 2.71, BUN 37, Potassium 4.4, Sodium 142, GFR 23 A complete set of results can be found in Results Review   Recommendations:  No changes and encouraged to call if experiencing any fluid symptoms.   Follow-up plan: ICM clinic phone appointment on 06/29/2022 (post ICD battery replacement).   91 day device clinic remote transmission pending battery replacement.     EP/Cardiology Office Visits: 06/17/2022 with Dr Stanford Breed.      Copy of ICM check sent to Dr. Curt Bears.   3 month ICM trend: 04/20/2022.    12-14 Month ICM trend:     Rosalene Billings, RN 04/23/2022 8:30 AM

## 2022-04-30 ENCOUNTER — Ambulatory Visit: Payer: Medicare HMO | Attending: Cardiology

## 2022-04-30 DIAGNOSIS — I255 Ischemic cardiomyopathy: Secondary | ICD-10-CM

## 2022-04-30 LAB — CUP PACEART REMOTE DEVICE CHECK
Battery Remaining Longevity: 0 mo
Battery Voltage: 2.57 V
Brady Statistic AP VP Percent: 64 %
Brady Statistic AP VS Percent: 1 %
Brady Statistic AS VP Percent: 35 %
Brady Statistic AS VS Percent: 1 %
Brady Statistic RA Percent Paced: 63 %
Date Time Interrogation Session: 20240115020026
HighPow Impedance: 62 Ohm
HighPow Impedance: 62 Ohm
Implantable Lead Connection Status: 753985
Implantable Lead Connection Status: 753985
Implantable Lead Connection Status: 753985
Implantable Lead Implant Date: 20121219
Implantable Lead Implant Date: 20121219
Implantable Lead Implant Date: 20121219
Implantable Lead Location: 753858
Implantable Lead Location: 753859
Implantable Lead Location: 753860
Implantable Pulse Generator Implant Date: 20170508
Lead Channel Impedance Value: 300 Ohm
Lead Channel Impedance Value: 390 Ohm
Lead Channel Impedance Value: 660 Ohm
Lead Channel Pacing Threshold Amplitude: 0.625 V
Lead Channel Pacing Threshold Amplitude: 0.875 V
Lead Channel Pacing Threshold Amplitude: 2.75 V
Lead Channel Pacing Threshold Pulse Width: 0.5 ms
Lead Channel Pacing Threshold Pulse Width: 0.5 ms
Lead Channel Pacing Threshold Pulse Width: 0.6 ms
Lead Channel Sensing Intrinsic Amplitude: 12 mV
Lead Channel Sensing Intrinsic Amplitude: 3.8 mV
Lead Channel Setting Pacing Amplitude: 2 V
Lead Channel Setting Pacing Amplitude: 2 V
Lead Channel Setting Pacing Amplitude: 3.25 V
Lead Channel Setting Pacing Pulse Width: 0.5 ms
Lead Channel Setting Pacing Pulse Width: 0.6 ms
Lead Channel Setting Sensing Sensitivity: 2 mV
Pulse Gen Serial Number: 7306740

## 2022-05-04 ENCOUNTER — Other Ambulatory Visit: Payer: Medicare HMO

## 2022-05-04 ENCOUNTER — Other Ambulatory Visit (HOSPITAL_COMMUNITY)
Admission: RE | Admit: 2022-05-04 | Discharge: 2022-05-04 | Disposition: A | Discharge status: Home / Self Care | Payer: Medicare HMO | Source: Ambulatory Visit

## 2022-05-04 DIAGNOSIS — I255 Ischemic cardiomyopathy: Secondary | ICD-10-CM

## 2022-05-04 DIAGNOSIS — Z01812 Encounter for preprocedural laboratory examination: Secondary | ICD-10-CM

## 2022-05-04 DIAGNOSIS — I5022 Chronic systolic (congestive) heart failure: Secondary | ICD-10-CM

## 2022-05-04 LAB — BASIC METABOLIC PANEL
BUN/Creatinine Ratio: 16 (ref 10–24)
BUN: 41 mg/dL — ABNORMAL HIGH (ref 8–27)
CO2: 26 mmol/L (ref 20–29)
Calcium: 10.1 mg/dL (ref 8.6–10.2)
Chloride: 105 mmol/L (ref 96–106)
Creatinine, Ser: 2.59 mg/dL — ABNORMAL HIGH (ref 0.76–1.27)
Glucose: 108 mg/dL — ABNORMAL HIGH (ref 70–99)
Potassium: 4.8 mmol/L (ref 3.5–5.2)
Sodium: 143 mmol/L (ref 134–144)
eGFR: 24 mL/min/{1.73_m2} — ABNORMAL LOW (ref >59)

## 2022-05-04 LAB — CBC
Hematocrit: 39.2 % (ref 37.5–51.0)
Hemoglobin: 12.6 g/dL — ABNORMAL LOW (ref 13.0–17.7)
MCH: 28.4 pg (ref 26.6–33.0)
MCHC: 32.1 g/dL (ref 31.5–35.7)
MCV: 89 fL (ref 79–97)
Platelets: 213 10*3/uL (ref 150–450)
RBC: 4.43 x10E6/uL (ref 4.14–5.80)
RDW: 15.2 % (ref 11.6–15.4)
WBC: 7.7 10*3/uL (ref 3.4–10.8)

## 2022-05-06 ENCOUNTER — Other Ambulatory Visit: Payer: Self-pay | Admitting: Cardiology

## 2022-05-06 DIAGNOSIS — D696 Thrombocytopenia, unspecified: Secondary | ICD-10-CM

## 2022-05-06 DIAGNOSIS — E7800 Pure hypercholesterolemia, unspecified: Secondary | ICD-10-CM

## 2022-05-06 DIAGNOSIS — I2510 Atherosclerotic heart disease of native coronary artery without angina pectoris: Secondary | ICD-10-CM

## 2022-05-06 NOTE — Addendum Note (Signed)
Addended by: Douglass Rivers D on: 05/06/2022 04:24 PM   Modules accepted: Level of Service

## 2022-05-06 NOTE — Progress Notes (Signed)
Remote ICD transmission.   

## 2022-05-07 ENCOUNTER — Encounter: Payer: Self-pay | Admitting: Pharmacist Clinician (PhC)/ Clinical Pharmacy Specialist

## 2022-05-07 NOTE — Pre-Procedure Instructions (Signed)
Instructed patient on the following items: Arrival time 1:00 Nothing to eat or drink after midnight No meds AM of procedure Responsible person to drive you home and stay with you for 24 hrs Wash with special soap night before and morning of procedure If on anti-coagulant drug instructions Eliquis- last dose should have been 05/05/22

## 2022-05-08 ENCOUNTER — Encounter (HOSPITAL_COMMUNITY)
Admission: RE | Disposition: A | Discharge status: Home / Self Care | Payer: Self-pay | Source: Home / Self Care | Attending: Cardiology

## 2022-05-08 ENCOUNTER — Other Ambulatory Visit: Payer: Self-pay

## 2022-05-08 ENCOUNTER — Ambulatory Visit (HOSPITAL_COMMUNITY)
Admission: RE | Admit: 2022-05-08 | Discharge: 2022-05-08 | Disposition: A | Discharge status: Home / Self Care | Payer: Medicare HMO | Attending: Cardiology | Admitting: Cardiology

## 2022-05-08 DIAGNOSIS — Z4502 Encounter for adjustment and management of automatic implantable cardiac defibrillator: Secondary | ICD-10-CM | POA: Diagnosis present

## 2022-05-08 DIAGNOSIS — I5022 Chronic systolic (congestive) heart failure: Secondary | ICD-10-CM | POA: Insufficient documentation

## 2022-05-08 DIAGNOSIS — I255 Ischemic cardiomyopathy: Secondary | ICD-10-CM | POA: Diagnosis not present

## 2022-05-08 DIAGNOSIS — I110 Hypertensive heart disease with heart failure: Secondary | ICD-10-CM | POA: Insufficient documentation

## 2022-05-08 DIAGNOSIS — E785 Hyperlipidemia, unspecified: Secondary | ICD-10-CM | POA: Insufficient documentation

## 2022-05-08 DIAGNOSIS — I480 Paroxysmal atrial fibrillation: Secondary | ICD-10-CM | POA: Diagnosis not present

## 2022-05-08 DIAGNOSIS — I447 Left bundle-branch block, unspecified: Secondary | ICD-10-CM | POA: Insufficient documentation

## 2022-05-08 DIAGNOSIS — I252 Old myocardial infarction: Secondary | ICD-10-CM | POA: Diagnosis not present

## 2022-05-08 DIAGNOSIS — Z951 Presence of aortocoronary bypass graft: Secondary | ICD-10-CM | POA: Insufficient documentation

## 2022-05-08 HISTORY — PX: BIV ICD GENERATOR CHANGEOUT: EP1194

## 2022-05-08 SURGERY — BIV ICD GENERATOR CHANGEOUT

## 2022-05-08 MED ORDER — SODIUM CHLORIDE 0.9 % IV SOLN: INTRAVENOUS | Status: DC

## 2022-05-08 MED ORDER — SODIUM CHLORIDE 0.9 % IV SOLN
80.0000 mg | INTRAVENOUS | Status: AC
  Administered 2022-05-08: 80 mg

## 2022-05-08 MED ORDER — SODIUM CHLORIDE 0.9 % IV SOLN
INTRAVENOUS | Status: AC
  Filled 2022-05-08: qty 2

## 2022-05-08 MED ORDER — MIDAZOLAM HCL 5 MG/5ML IJ SOLN
INTRAMUSCULAR | Status: AC
  Filled 2022-05-08: qty 5

## 2022-05-08 MED ORDER — CEFAZOLIN SODIUM-DEXTROSE 2-4 GM/100ML-% IV SOLN
2.0000 g | INTRAVENOUS | Status: AC
  Administered 2022-05-08: 2 g via INTRAVENOUS

## 2022-05-08 MED ORDER — LIDOCAINE HCL (PF) 1 % IJ SOLN
INTRAMUSCULAR | Status: DC | PRN
  Administered 2022-05-08: 60 mL

## 2022-05-08 MED ORDER — ACETAMINOPHEN 325 MG PO TABS: 325.0000 mg | ORAL_TABLET | ORAL | Status: DC | PRN

## 2022-05-08 MED ORDER — MIDAZOLAM HCL 5 MG/5ML IJ SOLN
INTRAMUSCULAR | Status: DC | PRN
  Administered 2022-05-08: 1 mg via INTRAVENOUS

## 2022-05-08 MED ORDER — POVIDONE-IODINE 10 % EX SWAB
2.0000 | Freq: Once | CUTANEOUS | Status: AC
  Administered 2022-05-08: 2 via TOPICAL

## 2022-05-08 MED ORDER — CHLORHEXIDINE GLUCONATE 4 % EX LIQD: 4.0000 | Freq: Once | CUTANEOUS | Status: DC

## 2022-05-08 MED ORDER — CEFAZOLIN SODIUM-DEXTROSE 2-4 GM/100ML-% IV SOLN
INTRAVENOUS | Status: AC
  Filled 2022-05-08: qty 100

## 2022-05-08 MED ORDER — FENTANYL CITRATE (PF) 100 MCG/2ML IJ SOLN
INTRAMUSCULAR | Status: AC
  Filled 2022-05-08: qty 2

## 2022-05-08 MED ORDER — ONDANSETRON HCL 4 MG/2ML IJ SOLN: 4.0000 mg | Freq: Four times a day (QID) | INTRAMUSCULAR | Status: DC | PRN

## 2022-05-08 MED ORDER — FENTANYL CITRATE (PF) 100 MCG/2ML IJ SOLN
INTRAMUSCULAR | Status: DC | PRN
  Administered 2022-05-08: 25 ug via INTRAVENOUS

## 2022-05-08 MED ORDER — LIDOCAINE HCL (PF) 1 % IJ SOLN
INTRAMUSCULAR | Status: AC
  Filled 2022-05-08: qty 60

## 2022-05-08 SURGICAL SUPPLY — 6 items
CABLE SURGICAL S-101-97-12 (CABLE) ×1 IMPLANT
ICD GALLANT HFCRTD CDHFA500Q (ICD Generator) IMPLANT
PAD DEFIB RADIO PHYSIO CONN (PAD) ×1 IMPLANT
POUCH AIGIS-R ANTIBACT ICD (Mesh General) ×1 IMPLANT
POUCH AIGIS-R ANTIBACT ICD LRG (Mesh General) IMPLANT
TRAY PACEMAKER INSERTION (PACKS) ×1 IMPLANT

## 2022-05-08 NOTE — H&P (Signed)
Electrophysiology Office Note   Date:  05/08/2022   ID:  Hansel, Devan Jan 02, 1941, MRN [id number]  PCP:  Loraine Leriche., MD  Cardiologist:  Stanford Breed Primary Electrophysiologist:  Oveda Dadamo Meredith Leeds, MD    Chief Complaint: ICD   History of Present Illness: CANDON CARAS is a 82 y.o. male who is being seen today for the evaluation of ICD at the request of No ref. provider found. Presenting today for electrophysiology evaluation.  Reason for coronary artery disease post CABG in 2012, hypertension, hyperlipidemia, chronic systolic heart failure due to ischemic cardiomyopathy, left bundle branch block, atrial fibrillation.  He is status post Abbott CRT-D implanted 04/01/2011 with generator change 08/19/2015.  Today, denies symptoms of palpitations, chest pain, shortness of breath, orthopnea, PND, lower extremity edema, claudication, dizziness, presyncope, syncope, bleeding, or neurologic sequela. The patient is tolerating medications without difficulties. Plan gen change today.    Past Medical History:  Diagnosis Date   CAD (coronary artery disease)    MI in [place] with stenting in 2010, then MI with CABG in [place] 06/2010.  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   Complete heart block (HCC)    Gout    Hyperlipidemia    Hypertension    Ischemic cardiomyopathy    EF 15% by echo 11/2010 and still 20-25% by follow-up echo 02/2011, s/p St. Jude Bi-V ICD implantation 04/01/11   LBBB (left bundle branch block)    Renal insufficiency    Cr 1.6 on 03/25/11   Past Surgical History:  Procedure Laterality Date   BI-VENTRICULAR IMPLANTABLE CARDIOVERTER DEFIBRILLATOR N/A 04/01/2011   Procedure: BI-VENTRICULAR IMPLANTABLE CARDIOVERTER DEFIBRILLATOR  (CRT-D);  Surgeon: Evans Lance, MD;  Location: [facility] CATH LAB;  Service: Cardiovascular;  Laterality: N/A;   CARDIAC DEFIBRILLATOR PLACEMENT  12/12   BiV ICD (SJM) implanted by Dr Rayann Heman   CARDIOVERSION N/A 04/22/2018   Procedure:  CARDIOVERSION;  Surgeon: Sueanne Margarita, MD;  Location: [place];  Service: Cardiovascular;  Laterality: N/A;   CARDIOVERSION N/A 06/13/2019   Procedure: CARDIOVERSION;  Surgeon: Sueanne Margarita, MD;  Location: [facility] ENDOSCOPY;  Service: Cardiovascular;  Laterality: N/A;   Carpel tunnel surgery     CORONARY ARTERY BYPASS GRAFT  3/12   in [place] N/A 08/19/2015   Procedure: [place];  Surgeon: Thompson Grayer, MD;  Location: [place] CV LAB;  Service: Cardiovascular;  Laterality: N/A;   INGUINAL HERNIA REPAIR     TONSILLECTOMY     UMBILICAL HERNIA REPAIR       Current Facility-Administered Medications  Medication Dose Route Frequency Provider Last Rate Last Admin   0.9 %  sodium chloride infusion   Intravenous Continuous Merl Bommarito, Ocie Doyne, MD 50 mL/hr at 05/08/22 1339 New Bag at 05/08/22 1339   ceFAZolin (ANCEF) IVPB 2g/100 mL premix  2 g Intravenous On Call Wilmer Berryhill Hassell Done, MD       chlorhexidine (HIBICLENS) 4 % liquid 4 Application  4 Application Topical Once Tinlee Navarrette Hassell Done, MD       gentamicin (GARAMYCIN) 80 mg in sodium chloride 0.9 % 500 mL irrigation  80 mg Irrigation On Call Adrinne Sze, Ocie Doyne, MD        Allergies:   Statins, Nsaids, Tolmetin, Atorvastatin, and Pravastatin   Social History:  The patient  reports that he has never smoked. He has never used smokeless tobacco. He reports current alcohol use of about 2.0 standard drinks of alcohol per week. He reports  that he does not use drugs.   Family History:  The patient's family history includes Pancreatic cancer in his mother.   ROS:  Please see the history of present illness.   Otherwise, review of systems is positive for none.   All other systems are reviewed and negative.   PHYSICAL EXAM: VS:  BP (!) 161/82   Pulse 65   Temp (!) 97.5 F (36.4 C) (Temporal)   Resp 16   Ht 5\' 7"  (1.702 m)   Wt 68 kg   SpO2 100%   BMI 23.49 kg/m  , BMI Body mass index is 23.49  kg/m. GEN: Well nourished, well developed, in no acute distress  HEENT: normal  Neck: no JVD, carotid bruits, or masses Cardiac: RRR; no murmurs, rubs, or gallops,no edema  Respiratory:  clear to auscultation bilaterally, normal work of breathing GI: soft, nontender, nondistended, + BS MS: no deformity or atrophy  Skin: warm and dry Neuro:  Strength and sensation are intact Psych: euthymic mood, full affect    Recent Labs: 09/10/2021: ALT 18; NT-Pro BNP 7,964 05/04/2022: BUN 41; Creatinine, Ser 2.59; Hemoglobin 12.6; Platelets 213; Potassium 4.8; Sodium 143    Lipid Panel     Component Value Date/Time   CHOL 145 09/10/2021 1124   TRIG 97 09/10/2021 1124   HDL 42 09/10/2021 1124   CHOLHDL 3.5 09/10/2021 1124   CHOLHDL 3.7 07/13/2012 1434   VLDL 20 07/13/2012 1434   LDLCALC 85 09/10/2021 1124     Wt Readings from Last 3 Encounters:  05/08/22 68 kg  03/24/22 68.6 kg  12/10/21 68.2 kg      Other studies Reviewed: Additional studies/ records that were reviewed today include: TTE 09/12/21  Review of the above records today demonstrates:   1. Apical LV thrombus is present. Left ventricular ejection fraction, by  estimation, is 20 to 25%. The left ventricle has severely decreased  function. The left ventricle demonstrates regional wall motion  abnormalities (see scoring diagram/findings for  description). Left ventricular diastolic parameters are consistent with  Grade III diastolic dysfunction (restrictive). Elevated left atrial  pressure.   2. Right ventricular systolic function is mildly reduced. The right  ventricular size is moderately enlarged. There is moderately elevated  pulmonary artery systolic pressure.   3. Left atrial size was severely dilated.   4. Right atrial size was severely dilated.   5. The mitral valve is normal in structure. Mild mitral valve  regurgitation. No evidence of mitral stenosis.   6. Tricuspid valve regurgitation is mild to moderate.    7. The aortic valve is tricuspid. Aortic valve regurgitation is mild.  Aortic valve sclerosis/calcification is present, without any evidence of  aortic stenosis.   8. The inferior vena cava is normal in size with <50% respiratory  variability, suggesting right atrial pressure of 8 mmHg.    ASSESSMENT AND PLAN:  1.  Chronic systolic heart failure:  ICD Criteria  Current LVEF:25%. Within 12 months prior to implant: yes   Heart failure history: Yes, Class II  Cardiomyopathy history: Yes, Ischemic Cardiomyopathy - Prior MI.  Atrial Fibrillation/Atrial Flutter: Yes, Paroxysmal.  Ventricular tachycardia history: No.  Cardiac arrest history: No.  History of syndromes with risk of sudden death: No.  Previous ICD: Yes, Reason for ICD:  Primary prevention.  Current ICD indication: Primary  PPM indication: No.  Class I or II Bradycardia indication present: No  Beta Blocker therapy for 3 or more months: Yes, prescribed.   Ace Inhibitor/ARB therapy  for 3 or more months: Yes, prescribed.    I have seen MONTERRIUS CARDOSA is a 82 y.o. malepre-procedural and has been referred by [facility] for consideration of ICD implant for proimary prevention of sudden death.  The patient's chart has been reviewed and they meet criteria for ICD implant.  I have had a thorough discussion with the patient reviewing options.  The patient and their family (if available) have had opportunities to ask questions and have them answered. The patient and I have decided together through the [place] [employer] to gen change ICD at this time.  Risks, benefits, alternatives to ICD implantation were discussed in detail with the patient today. The patient  understands that the risks include but are not limited to bleeding, infection, pneumothorax, perforation, tamponade, vascular damage, renal failure, MI, stroke, death, inappropriate shocks, and lead dislodgement and  wishes to proceed.

## 2022-05-08 NOTE — Discharge Instructions (Signed)
Implantable Cardiac Device Battery Change, Care After  This sheet gives you information about how to care for yourself after your procedure. Your health care provider may also give you more specific instructions. If you have problems or questions, contact your health care provider. What can I expect after the procedure? After your procedure, it is common to have:  Pain or soreness at the site where the cardiac device was inserted.  Swelling at the site where the cardiac device was inserted.  You should received an information card for your new device in 4-8 weeks. Follow these instructions at home: Incision care   Keep the incision clean and dry. ? Do not take baths, swim, or use a hot tub until after your wound check.  ? Do not shower for at least 7 days, or as directed by your health care provider. ? Pat the area dry with a clean towel. Do not rub the area. This may cause bleeding.  Follow instructions from your health care provider about how to take care of your incision. Make sure you: ? Leave stitches (sutures), skin glue, or adhesive strips in place. These skin closures may need to stay in place for 2 weeks or longer. If adhesive strip edges start to loosen and curl up, you may trim the loose edges. Do not remove adhesive strips completely unless your health care provider tells you to do that.  Check your incision area every day for signs of infection. Check for: ? More redness, swelling, or pain. ? More fluid or blood. ? Warmth. ? Pus or a bad smell. Activity  Do not lift anything that is heavier than 10 lb (4.5 kg) until your health care provider says it is okay to do so.  For the first week, or as long as told by your health care provider: ? Avoid lifting your affected arm higher than your shoulder. ? After 1 week, Be gentle when you move your arms over your head. It is okay to raise your arm to comb your hair. ? Avoid strenuous exercise.  Ask your health care provider  when it is okay to: ? Resume your normal activities. ? Return to work or school. ? Resume sexual activity. Eating and drinking  Eat a heart-healthy diet. This should include plenty of fresh fruits and vegetables, whole grains, low-fat dairy products, and lean protein like chicken and fish.  Limit alcohol intake to no more than 1 drink a day for non-pregnant women and 2 drinks a day for men. One drink equals 12 oz of beer, 5 oz of wine, or 1 oz of hard liquor.  Check ingredients and nutrition facts on packaged foods and beverages. Avoid the following types of food: ? Food that is high in salt (sodium). ? Food that is high in saturated fat, like full-fat dairy or red meat. ? Food that is high in trans fat, like fried food. ? Food and drinks that are high in sugar. Lifestyle  Do not use any products that contain nicotine or tobacco, such as cigarettes and e-cigarettes. If you need help quitting, ask your health care provider.  Take steps to manage and control your weight.  Once cleared, get regular exercise. Aim for 150 minutes of moderate-intensity exercise (such as walking or yoga) or 75 minutes of vigorous exercise (such as running or swimming) each week.  Manage other health problems, such as diabetes or high blood pressure. Ask your health care provider how you can manage these conditions. General instructions  Do   not drive for 24 hours after your procedure if you were given a medicine to help you relax (sedative).  Take over-the-counter and prescription medicines only as told by your health care provider.  Avoid putting pressure on the area where the cardiac device was placed.  If you need an MRI after your cardiac device has been placed, be sure to tell the health care provider who orders the MRI that you have a cardiac device.  Avoid close and prolonged exposure to electrical devices that have strong magnetic fields. These include: ? Cell phones. Avoid keeping them in a  pocket near the cardiac device, and try using the ear opposite the cardiac device. ? MP3 players. ? Household appliances, like microwaves. ? Metal detectors. ? Electric generators. ? High-tension wires.  Keep all follow-up visits as directed by your health care provider. This is important. Contact a health care provider if:  You have pain at the incision site that is not relieved by over-the-counter or prescription medicines.  You have any of these around your incision site or coming from it: ? More redness, swelling, or pain. ? Fluid or blood. ? Warmth to the touch. ? Pus or a bad smell.  You have a fever.  You feel brief, occasional palpitations, light-headedness, or any symptoms that you think might be related to your heart. Get help right away if:  You experience chest pain that is different from the pain at the cardiac device site.  You develop a red streak that extends above or below the incision site.  You experience shortness of breath.  You have palpitations or an irregular heartbeat.  You have light-headedness that does not go away quickly.  You faint or have dizzy spells.  Your pulse suddenly drops or increases rapidly and does not return to normal.  You begin to gain weight and your legs and ankles swell. Summary  After your procedure, it is common to have pain, soreness, and some swelling where the cardiac device was inserted.  Make sure to keep your incision clean and dry. Follow instructions from your health care provider about how to take care of your incision.  Check your incision every day for signs of infection, such as more pain or swelling, pus or a bad smell, warmth, or leaking fluid and blood.  Avoid strenuous exercise and lifting your left arm higher than your shoulder for 2 weeks, or as long as told by your health care provider. This information is not intended to replace advice given to you by your health care provider. Make sure you discuss  any questions you have with your health care provider.   

## 2022-05-11 ENCOUNTER — Encounter (HOSPITAL_COMMUNITY): Payer: Self-pay | Admitting: Cardiology

## 2022-05-21 ENCOUNTER — Encounter (HOSPITAL_COMMUNITY): Payer: Self-pay | Admitting: *Deleted

## 2022-05-21 NOTE — Progress Notes (Signed)
Remote ICD transmission.   

## 2022-05-27 ENCOUNTER — Ambulatory Visit: Payer: Medicare HMO | Attending: Internal Medicine

## 2022-05-27 DIAGNOSIS — I255 Ischemic cardiomyopathy: Secondary | ICD-10-CM

## 2022-05-27 NOTE — Patient Instructions (Signed)
   After Your ICD (Implantable Cardiac Defibrillator)    Monitor your defibrillator site for redness, swelling, and drainage. Call the device clinic at [phone] if you experience these symptoms or fever/chills.  Your incision was closed with Steri-strips or staples:  You may shower 7 days after your procedure and wash your incision with soap and water. Avoid lotions, ointments, or perfumes over your incision until it is well-healed.  You may use a hot tub or a pool after your wound check appointment if the incision is completely closed.   Your ICD is designed to protect you from life threatening heart rhythms. Because of this, you may receive a shock.   1 shock with no symptoms:  Call the office during business hours. 1 shock with symptoms (chest pain, chest pressure, dizziness, lightheadedness, shortness of breath, overall feeling unwell):  Call 911. If you experience 2 or more shocks in 24 hours:  Call 911. If you receive a shock, you should not drive.  [place] DMV - no driving for 6 months if you receive appropriate therapy from your ICD.   ICD Alerts:  Some alerts are vibratory and others beep. These are NOT emergencies. Please call our office to let us know. If this occurs at night or on weekends, it can wait until the next business day. Send a remote transmission.  If your device is capable of reading fluid status (for heart failure), you will be offered monthly monitoring to review this with you.   Remote monitoring is used to monitor your ICD from home. This monitoring is scheduled every 91 days by our office. It allows us to keep an eye on the functioning of your device to ensure it is working properly. You will routinely see your Electrophysiologist annually (more often if necessary).  

## 2022-05-28 LAB — CUP PACEART INCLINIC DEVICE CHECK
Battery Remaining Longevity: 72 mo
Brady Statistic RA Percent Paced: 73 %
Brady Statistic RV Percent Paced: 99.43 %
Date Time Interrogation Session: 20240214104500
HighPow Impedance: 57.375
Implantable Lead Connection Status: 753985
Implantable Lead Connection Status: 753985
Implantable Lead Connection Status: 753985
Implantable Lead Implant Date: 20121219
Implantable Lead Implant Date: 20121219
Implantable Lead Implant Date: 20121219
Implantable Lead Location: 753858
Implantable Lead Location: 753859
Implantable Lead Location: 753860
Implantable Pulse Generator Implant Date: 20240126
Lead Channel Impedance Value: 325 Ohm
Lead Channel Impedance Value: 425 Ohm
Lead Channel Impedance Value: 675 Ohm
Lead Channel Pacing Threshold Amplitude: 0.75 V
Lead Channel Pacing Threshold Amplitude: 0.75 V
Lead Channel Pacing Threshold Amplitude: 0.75 V
Lead Channel Pacing Threshold Amplitude: 0.75 V
Lead Channel Pacing Threshold Amplitude: 2.25 V
Lead Channel Pacing Threshold Amplitude: 2.25 V
Lead Channel Pacing Threshold Pulse Width: 0.5 ms
Lead Channel Pacing Threshold Pulse Width: 0.5 ms
Lead Channel Pacing Threshold Pulse Width: 0.5 ms
Lead Channel Pacing Threshold Pulse Width: 0.5 ms
Lead Channel Pacing Threshold Pulse Width: 0.6 ms
Lead Channel Pacing Threshold Pulse Width: 0.6 ms
Lead Channel Sensing Intrinsic Amplitude: 4.4 mV
Lead Channel Setting Pacing Amplitude: 2 V
Lead Channel Setting Pacing Amplitude: 2.125
Lead Channel Setting Pacing Amplitude: 2.5 V
Lead Channel Setting Pacing Pulse Width: 0.5 ms
Lead Channel Setting Pacing Pulse Width: 0.6 ms
Lead Channel Setting Sensing Sensitivity: 0.5 mV
Pulse Gen Serial Number: 211014942

## 2022-05-28 NOTE — Progress Notes (Signed)
Wound check appointment. Steri-strips removed. Wound without redness or edema. Incision edges approximated, wound well healed.   Normal device function. CRT Effectively pacing >99%. Thresholds, sensing, and impedances consistent with implant measurements. Device programmed for appropriate safety margins for chronic leads. Histogram distribution appropriate for patient and level of activity. No mode switches or ventricular arrhythmias noted. Patient educated about wound care, This was a gen change only, no new leads so no further arm movement, lift/push/pulls restrictions.  ROV in 3 months with implanting physician.  Reviewed shock plan.

## 2022-06-01 ENCOUNTER — Ambulatory Visit: Payer: Medicare HMO

## 2022-06-03 NOTE — Progress Notes (Signed)
HPI: FU coronary artery disease status post coronary artery bypass and graft as well as ischemic cardiomyopathy. Patient's cardiac history dates back to 2010 when he had his first myocardial infarction. He had stents placed in [place]. He had a second myocardial infarction in March of 2012 and then had coronary artery bypass and graft. Cardiac catheterization was performed in August of 2012. Ejection fraction was 20%. The right coronary and LAD were occluded and there was critical circumflex disease. There was a patent saphenous vein graft to the right coronary artery. The LIMA to the LAD was patent. The saphenous vein graft to the obtuse marginal was also patent. Patient had biventricular ICD placed in December of 2012. Has had previous DCCV.  At last office visit patient complaining of increased CHF symptoms.  Follow-up echocardiogram June 2023 showed ejection fraction 20 to [id number], grade 3 diastolic dysfunction, apical thrombus, moderate right ventricular enlargement, mild RV dysfunction, severe biatrial enlargement, mild mitral regurgitation, mild to moderate tricuspid regurgitation, mild aortic insufficiency.  Since last seen, patient denies dyspnea, chest pain, palpitations or syncope.  He did trip recently and struck his head 3 weeks ago with bruising but he has not had headaches.  Current Outpatient Medications  Medication Sig Dispense Refill   acetaminophen (TYLENOL) 500 MG tablet Take 1,000 mg by mouth as needed for moderate pain or headache.      allopurinol (ZYLOPRIM) 300 MG tablet Take 300 mg by mouth daily as needed (Gout).  11   apixaban (ELIQUIS) 2.5 MG TABS tablet Take 1 tablet (2.5 mg total) by mouth 2 (two) times daily. 180 tablet 3   Bempedoic Acid-Ezetimibe (NEXLIZET) 180-10 MG TABS Take 1 tablet by mouth once daily 30 tablet 11   carvedilol (COREG) 12.5 MG tablet Take 1 tablet (12.5 mg total) by mouth 2 (two) times daily with a meal. 180 tablet 2   cholecalciferol (VITAMIN D3) 25  MCG (1000 UNIT) tablet Take 1,000 Units by mouth daily.     colchicine 0.6 MG tablet Take 0.3 mg by mouth daily as needed (Gout).     finasteride (PROSCAR) 5 MG tablet Take 5 mg by mouth daily.     furosemide (LASIX) 20 MG tablet Take 20 mg by mouth daily.     hydrALAZINE (APRESOLINE) 10 MG tablet Take 10 mg by mouth 3 (three) times daily.     isosorbide mononitrate (IMDUR) 30 MG 24 hr tablet Take 1 tablet (30 mg total) by mouth daily. 90 tablet 3   potassium chloride SA (K-DUR,KLOR-CON) 20 MEQ tablet Take 1 tablet (20 mEq total) by mouth daily. 30 tablet 11   tamsulosin (FLOMAX) 0.4 MG CAPS capsule Take 0.4 mg by mouth daily.     No current facility-administered medications for this visit.     Past Medical History:  Diagnosis Date   CAD (coronary artery disease)    MI in [place] with stenting in 2010, then MI with CABG in [place] 06/2010.  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   Complete heart block (HCC)    Gout    Hyperlipidemia    Hypertension    Ischemic cardiomyopathy    EF 15% by echo 11/2010 and still 20-25% by follow-up echo 02/2011, s/p St. Jude Bi-V ICD implantation 04/01/11   LBBB (left bundle branch block)    Renal insufficiency    Cr 1.6 on 03/25/11    Past Surgical History:  Procedure Laterality Date   BI-VENTRICULAR IMPLANTABLE CARDIOVERTER DEFIBRILLATOR N/A 04/01/2011   Procedure: BI-VENTRICULAR IMPLANTABLE CARDIOVERTER  DEFIBRILLATOR  (CRT-D);  Surgeon: Evans Lance, MD;  Location: [facility] CATH LAB;  Service: Cardiovascular;  Laterality: N/A;   BIV ICD GENERATOR CHANGEOUT N/A 05/08/2022   Procedure: BIV ICD GENERATOR CHANGEOUT;  Surgeon: Constance Haw, MD;  Location: [place] CV LAB;  Service: Cardiovascular;  Laterality: N/A;   CARDIAC DEFIBRILLATOR PLACEMENT  12/12   BiV ICD (SJM) implanted by Dr Rayann Heman   CARDIOVERSION N/A 04/22/2018   Procedure: CARDIOVERSION;  Surgeon: Sueanne Margarita, MD;  Location: [place];  Service: Cardiovascular;  Laterality: N/A;    CARDIOVERSION N/A 06/13/2019   Procedure: CARDIOVERSION;  Surgeon: Sueanne Margarita, MD;  Location[facility] ENDOSCOPY;  Service: Cardiovascular;  Laterality: N/A;   Carpel tunnel surgery     CORONARY ARTERY BYPASS GRAFT  3/12   in [place] N/A 08/19/2015   Procedure: [place];  Surgeon: Thompson Grayer, MD;  Location: [place] CV LAB;  Service: Cardiovascular;  Laterality: N/A;   INGUINAL HERNIA REPAIR     TONSILLECTOMY     UMBILICAL HERNIA REPAIR      Social History   Socioeconomic History   Marital status: Married    Spouse name: Not on file   Number of children: 4   Years of education: Not on file   Highest education level: Not on file  Occupational History    Comment: Retired  Tobacco Use   Smoking status: Never   Smokeless tobacco: Never  Vaping Use   Vaping Use: Never used  Substance and Sexual Activity   Alcohol use: Yes    Alcohol/week: 2.0 standard drinks of alcohol    Types: 2 Glasses of wine per week    Comment: occasional   Drug use: No   Sexual activity: Yes  Other Topics Concern   Not on file  Social History Narrative   Lives in [place], recently moved from [place].  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   Social Determinants of Radio broadcast assistant Strain: Not on file  Food Insecurity: Not on file  Transportation Needs: Not on file  Physical Activity: Not on file  Stress: Not on file  Social Connections: Not on file  Intimate Partner Violence: Not on file    Family History  Problem Relation Age of Onset   Pancreatic cancer Mother     ROS: no fevers or chills, productive cough, hemoptysis, dysphasia, odynophagia, melena, hematochezia, dysuria, hematuria, rash, seizure activity, orthopnea, PND, pedal edema, claudication. Remaining systems are negative.  Physical Exam: Well-developed well-nourished in no acute distress.  Skin is warm and dry.  HEENT with ecchymosis surrounding left eye Neck is supple.  Chest is clear  to auscultation with normal expansion.  Cardiovascular exam is regular rate and rhythm.  Abdominal exam nontender or distended. No masses palpated. Extremities show no edema. neuro grossly intact   A/P  1 chronic systolic congestive heart failure-patient appears to be euvolemic.  He is not on ARNI, SGLT2 inhibitor or spironolactone due to severity of renal insufficiency.  Continue hydralazine/nitrates.  Continue fluid restriction and low-sodium diet.  Continue Lasix at present dose.  2 ischemic cardiomyopathy-continue hydralazine/nitrates and carvedilol at present dose.  Entresto discontinued previously by nephrology.  3 history of apical thrombus-continue apixaban.  4 paroxysmal atrial fibrillation-he remains in sinus rhythm today.  Continue carvedilol and apixaban.  5 hypertension-patient's blood pressure is controlled today.  Continue present medications and follow-up.  6 coronary artery disease-patient is not having chest pain.  He is  not on aspirin given need for apixaban and is intolerant to statins.  7 hyperlipidemia-intolerant to statins.  Continue bempedoic acid and Zetia.  8 status post CRT-D-Per EP.  Kirk Ruths, MD

## 2022-06-17 ENCOUNTER — Encounter: Payer: Self-pay | Admitting: Cardiology

## 2022-06-17 ENCOUNTER — Ambulatory Visit: Payer: Medicare HMO | Attending: Cardiology | Admitting: Cardiology

## 2022-06-17 VITALS — BP 120/60 | HR 60 | Ht 67.0 in | Wt 157.0 lb

## 2022-06-17 DIAGNOSIS — I255 Ischemic cardiomyopathy: Secondary | ICD-10-CM | POA: Diagnosis not present

## 2022-06-17 DIAGNOSIS — I2510 Atherosclerotic heart disease of native coronary artery without angina pectoris: Secondary | ICD-10-CM

## 2022-06-17 DIAGNOSIS — I480 Paroxysmal atrial fibrillation: Secondary | ICD-10-CM | POA: Diagnosis not present

## 2022-06-17 DIAGNOSIS — E7800 Pure hypercholesterolemia, unspecified: Secondary | ICD-10-CM

## 2022-06-17 DIAGNOSIS — I5022 Chronic systolic (congestive) heart failure: Secondary | ICD-10-CM

## 2022-06-17 DIAGNOSIS — Z95810 Presence of automatic (implantable) cardiac defibrillator: Secondary | ICD-10-CM

## 2022-06-17 NOTE — Patient Instructions (Signed)
    Follow-Up: At [facility], you and your health needs are our priority.  As part of our continuing mission to provide you with exceptional heart care, we have created designated Provider Care Teams.  These Care Teams include your primary Cardiologist (physician) and Advanced Practice Providers (APPs -  Physician Assistants and Nurse Practitioners) who all work together to provide you with the care you need, when you need it.  We recommend signing up for the patient portal called "MyChart".  Sign up information is provided on this After Visit Summary.  MyChart is used to connect with patients for Virtual Visits (Telemedicine).  Patients are able to view lab/test results, encounter notes, upcoming appointments, etc.  Non-urgent messages can be sent to your provider as well.   To learn more about what you can do with MyChart, go to [email or web address].    Your next appointment:   6 month(s)  Provider:   Brian Crenshaw, MD      

## 2022-06-29 ENCOUNTER — Ambulatory Visit: Payer: Medicare HMO | Attending: Cardiology

## 2022-06-29 DIAGNOSIS — I5022 Chronic systolic (congestive) heart failure: Secondary | ICD-10-CM

## 2022-06-29 DIAGNOSIS — Z95810 Presence of automatic (implantable) cardiac defibrillator: Secondary | ICD-10-CM

## 2022-07-03 NOTE — Progress Notes (Signed)
EPIC Encounter for ICM Monitoring  Patient Name: Dave Daniels is a 82 y.o. male Date: 07/03/2022 Primary Care Physican: Loraine Leriche., MD Primary Cardiologist: Stanford Breed Electrophysiologist: Curt Bears Nephrologist: [facility] Bi-V Pacing: >99%    08/13/2021 Weight: 152-153 lbs 02/03/2022 Weight: 152 lbs 03/04/2022 Weight: 150-152 lbs 07/03/2022 Weight: 150 lbs        AT/AF Burden <1% (taking Eliquis)                                    Spoke with patient and heart failure questions reviewed.  Transmission results reviewed.  Pt asymptomatic for fluid accumulation.  Reports feeling well at this time and voices no complaints.      Corvue thoracic impedance suggesting normal fluid levels.   Prescribed:  Furosemide 20 mg 2 tablet(s) (40 mg total) by mouth daily   Potassium 20 mEq 1 tablet daily.   Labs: 05/04/2022 BMET scheduled 02/16/2022 Creatinine 2.71, BUN 37, Potassium 4.4, Sodium 142, GFR 23 A complete set of results can be found in Results Review   Recommendations:  No changes and encouraged to call if experiencing any fluid symptoms.   Follow-up plan: ICM clinic phone appointment on 08/03/2022.   91 day device clinic remote transmission 08/10/2022.     EP/Cardiology Office Visits: 08/24/2022 with Dr Stanford Breed.   12/16/2022 with Dr Stanford Breed.   Copy of ICM check sent to Dr. Curt Bears.   3 month ICM trend: 06/29/2022.    12-14 Month ICM trend:     Rosalene Billings, RN 07/03/2022 3:22 PM

## 2022-08-03 ENCOUNTER — Ambulatory Visit: Payer: Medicare HMO | Attending: Cardiology

## 2022-08-03 DIAGNOSIS — Z95810 Presence of automatic (implantable) cardiac defibrillator: Secondary | ICD-10-CM | POA: Diagnosis not present

## 2022-08-03 DIAGNOSIS — I5022 Chronic systolic (congestive) heart failure: Secondary | ICD-10-CM

## 2022-08-07 ENCOUNTER — Telehealth: Payer: Self-pay

## 2022-08-07 NOTE — Telephone Encounter (Signed)
Remote ICM transmission received.  Attempted call to patient regarding ICM remote transmission and mail box is full. 

## 2022-08-07 NOTE — Progress Notes (Signed)
EPIC Encounter for ICM Monitoring  Patient Name: TEL HEVIA is a 82 y.o. male Date: 08/07/2022 Primary Care Physican: Cheron Schaumann., MD Primary Cardiologist: Jens Som Electrophysiologist: Elberta Fortis Nephrologist: [facility] Bi-V Pacing: >99%    08/13/2021 Weight: 152-153 lbs 02/03/2022 Weight: 152 lbs 03/04/2022 Weight: 150-152 lbs 07/03/2022 Weight: 150 lbs        AT/AF Burden 0% (taking Eliquis)                                    Attempted call to patient and unable to reach.   Transmission reviewed.      Corvue thoracic impedance suggesting normal fluid levels.   Prescribed:  Furosemide 20 mg 2 tablet(s) (40 mg total) by mouth daily   Potassium 20 mEq 1 tablet daily.   Labs: 05/04/2022 Creatinine 2.59, BUN 41, Potassium 4.8, Sodium 143, GFR 24 02/16/2022 Creatinine 2.71, BUN 37, Potassium 4.4, Sodium 142, GFR 23 A complete set of results can be found in Results Review   Recommendations:  Unable to reach.     Follow-up plan: ICM clinic phone appointment on 09/08/2022.   91 day device clinic remote transmission 11/09/2022.     EP/Cardiology Office Visits: 08/24/2022 with Dr Jens Som.   12/16/2022 with Dr Jens Som.   Copy of ICM check sent to Dr. Elberta Fortis.  3 month ICM trend: 08/03/2022.    12-14 Month ICM trend:     Karie Soda, RN 08/07/2022 3:08 PM

## 2022-08-10 ENCOUNTER — Ambulatory Visit (INDEPENDENT_AMBULATORY_CARE_PROVIDER_SITE_OTHER): Payer: Medicare HMO

## 2022-08-10 DIAGNOSIS — I255 Ischemic cardiomyopathy: Secondary | ICD-10-CM | POA: Diagnosis not present

## 2022-08-11 LAB — CUP PACEART REMOTE DEVICE CHECK
Battery Remaining Longevity: 74 mo
Battery Remaining Percentage: 95 %
Battery Voltage: 3.02 V
Brady Statistic AP VP Percent: 62 %
Brady Statistic AP VS Percent: 1 %
Brady Statistic AS VP Percent: 38 %
Brady Statistic AS VS Percent: 1 %
Brady Statistic RA Percent Paced: 61 %
Date Time Interrogation Session: 20240429021302
HighPow Impedance: 56 Ohm
Implantable Lead Connection Status: 753985
Implantable Lead Connection Status: 753985
Implantable Lead Connection Status: 753985
Implantable Lead Implant Date: 20121219
Implantable Lead Implant Date: 20121219
Implantable Lead Implant Date: 20121219
Implantable Lead Location: 753858
Implantable Lead Location: 753859
Implantable Lead Location: 753860
Implantable Pulse Generator Implant Date: 20240126
Lead Channel Impedance Value: 330 Ohm
Lead Channel Impedance Value: 380 Ohm
Lead Channel Impedance Value: 640 Ohm
Lead Channel Pacing Threshold Amplitude: 0.75 V
Lead Channel Pacing Threshold Amplitude: 0.75 V
Lead Channel Pacing Threshold Amplitude: 2.25 V
Lead Channel Pacing Threshold Pulse Width: 0.5 ms
Lead Channel Pacing Threshold Pulse Width: 0.5 ms
Lead Channel Pacing Threshold Pulse Width: 0.6 ms
Lead Channel Sensing Intrinsic Amplitude: 3.6 mV
Lead Channel Setting Pacing Amplitude: 2 V
Lead Channel Setting Pacing Amplitude: 2.5 V
Lead Channel Setting Pacing Amplitude: 2.75 V
Lead Channel Setting Pacing Pulse Width: 0.5 ms
Lead Channel Setting Pacing Pulse Width: 0.6 ms
Lead Channel Setting Sensing Sensitivity: 0.5 mV
Pulse Gen Serial Number: 211014942

## 2022-08-17 ENCOUNTER — Encounter: Payer: Medicare HMO | Admitting: Cardiology

## 2022-08-24 ENCOUNTER — Encounter: Payer: Self-pay | Admitting: Cardiology

## 2022-08-24 ENCOUNTER — Ambulatory Visit: Payer: Medicare HMO | Attending: Cardiology | Admitting: Cardiology

## 2022-08-24 VITALS — BP 130/78 | HR 60 | Ht 67.0 in | Wt 157.0 lb

## 2022-08-24 DIAGNOSIS — I255 Ischemic cardiomyopathy: Secondary | ICD-10-CM | POA: Diagnosis not present

## 2022-08-24 DIAGNOSIS — I5022 Chronic systolic (congestive) heart failure: Secondary | ICD-10-CM

## 2022-08-24 DIAGNOSIS — I2510 Atherosclerotic heart disease of native coronary artery without angina pectoris: Secondary | ICD-10-CM

## 2022-08-24 DIAGNOSIS — I480 Paroxysmal atrial fibrillation: Secondary | ICD-10-CM

## 2022-08-24 DIAGNOSIS — Z79899 Other long term (current) drug therapy: Secondary | ICD-10-CM

## 2022-08-24 LAB — CUP PACEART INCLINIC DEVICE CHECK
Battery Remaining Longevity: 70 mo
Brady Statistic RA Percent Paced: 59 %
Brady Statistic RV Percent Paced: 99.53 %
Date Time Interrogation Session: 20240513144301
HighPow Impedance: 54 Ohm
Implantable Lead Connection Status: 753985
Implantable Lead Connection Status: 753985
Implantable Lead Connection Status: 753985
Implantable Lead Implant Date: 20121219
Implantable Lead Implant Date: 20121219
Implantable Lead Implant Date: 20121219
Implantable Lead Location: 753858
Implantable Lead Location: 753859
Implantable Lead Location: 753860
Implantable Pulse Generator Implant Date: 20240126
Lead Channel Impedance Value: 337.5 Ohm
Lead Channel Impedance Value: 400 Ohm
Lead Channel Impedance Value: 662.5 Ohm
Lead Channel Pacing Threshold Amplitude: 0.75 V
Lead Channel Pacing Threshold Amplitude: 0.75 V
Lead Channel Pacing Threshold Amplitude: 0.75 V
Lead Channel Pacing Threshold Amplitude: 0.75 V
Lead Channel Pacing Threshold Amplitude: 2 V
Lead Channel Pacing Threshold Amplitude: 2 V
Lead Channel Pacing Threshold Pulse Width: 0.5 ms
Lead Channel Pacing Threshold Pulse Width: 0.5 ms
Lead Channel Pacing Threshold Pulse Width: 0.5 ms
Lead Channel Pacing Threshold Pulse Width: 0.5 ms
Lead Channel Pacing Threshold Pulse Width: 0.6 ms
Lead Channel Pacing Threshold Pulse Width: 0.6 ms
Lead Channel Sensing Intrinsic Amplitude: 3.4 mV
Lead Channel Setting Pacing Amplitude: 2 V
Lead Channel Setting Pacing Amplitude: 2.5 V
Lead Channel Setting Pacing Amplitude: 2.875
Lead Channel Setting Pacing Pulse Width: 0.5 ms
Lead Channel Setting Pacing Pulse Width: 0.6 ms
Lead Channel Setting Sensing Sensitivity: 0.5 mV
Pulse Gen Serial Number: 211014942

## 2022-08-24 NOTE — Progress Notes (Signed)
Electrophysiology Office Note   Date:  08/24/2022   ID:  Dave Daniels, DOB [birth year], MRN [id number]  PCP:  Cheron Schaumann., MD  Cardiologist:  Jens Som Primary Electrophysiologist:  Elison Worrel Jorja Loa, MD    Chief Complaint: ICD   History of Present Illness: Dave Daniels is a 82 y.o. male who is being seen today for the evaluation of ICD at the request of Dave Daniels, Gretchen Y.,*. Presenting today for electrophysiology evaluation.  He has a history of significant coronary artery disease post CABG in 2012, hypertension, hyperlipidemia, chronic systolic heart failure due to ischemic cardiomyopathy, left bundle branch block, atrial fibrillation.  He is post Abbott CRT-D implanted 03/22/2011 with generator change 08/19/2015.  He recently had a second generator change 05/08/2022.  Today, denies symptoms of palpitations, chest pain, shortness of breath, orthopnea, PND, claudication, dizziness, presyncope, syncope, bleeding, or neurologic sequela. The patient is tolerating medications without difficulties.  He overall feels well.  He has no chest pain or shortness of breath.  He is able to do all of his daily activities.  He was recently traveling with his wife.  His diet changed and he was potentially eating more salty foods.  He has had lower extremity edema for the last 5 days.   Past Medical History:  Diagnosis Date   CAD (coronary artery disease)    MI in [place] with stenting in 2010, then MI with CABG in [place] 06/2010.  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   Complete heart block (HCC)    Gout    Hyperlipidemia    Hypertension    Ischemic cardiomyopathy    EF 15% by echo 11/2010 and still 20-25% by follow-up echo 02/2011, s/p [employer] Bi-V ICD implantation 04/01/11   LBBB (left bundle branch block)    Renal insufficiency    Cr 1.6 on 03/25/11   Past Surgical History:  Procedure Laterality Date   BI-VENTRICULAR IMPLANTABLE CARDIOVERTER DEFIBRILLATOR N/A 04/01/2011    Procedure: BI-VENTRICULAR IMPLANTABLE CARDIOVERTER DEFIBRILLATOR  (CRT-D);  Surgeon: Marinus Maw, MD;  Location: [facility] CATH LAB;  Service: Cardiovascular;  Laterality: N/A;   BIV ICD GENERATOR CHANGEOUT N/A 05/08/2022   Procedure: BIV ICD GENERATOR CHANGEOUT;  Surgeon: Regan Lemming, MD;  Location: [facility] INVASIVE CV LAB;  Service: Cardiovascular;  Laterality: N/A;   CARDIAC DEFIBRILLATOR PLACEMENT  12/12   BiV ICD (SJM) implanted by Dr Johney Frame   CARDIOVERSION N/A 04/22/2018   Procedure: CARDIOVERSION;  Surgeon: Quintella Reichert, MD;  Location: [facility] ENDOSCOPY;  Service: Cardiovascular;  Laterality: N/A;   CARDIOVERSION N/A 06/13/2019   Procedure: CARDIOVERSION;  Surgeon: Quintella Reichert, MD;  Location: [facility] ENDOSCOPY;  Service: Cardiovascular;  Laterality: N/A;   Carpel tunnel surgery     CORONARY ARTERY BYPASS GRAFT  3/12   in [place]   EP IMPLANTABLE DEVICE N/A 08/19/2015   Procedure: BIV ICD Generator Changeout;  Surgeon: Hillis Range, MD;  Location: [facility] INVASIVE CV LAB;  Service: Cardiovascular;  Laterality: N/A;   INGUINAL HERNIA REPAIR     TONSILLECTOMY     UMBILICAL HERNIA REPAIR       Current Outpatient Medications  Medication Sig Dispense Refill   acetaminophen (TYLENOL) 500 MG tablet Take 1,000 mg by mouth as needed for moderate pain or headache.      allopurinol (ZYLOPRIM) 300 MG tablet Take 300 mg by mouth daily as needed (Gout).  11   apixaban (ELIQUIS) 2.5 MG TABS tablet Take 1 tablet (2.5 mg total) by mouth 2 (  two) times daily. 180 tablet 3   Bempedoic Acid-Ezetimibe (NEXLIZET) 180-10 MG TABS Take 1 tablet by mouth once daily 30 tablet 11   carvedilol (COREG) 12.5 MG tablet Take 1 tablet (12.5 mg total) by mouth 2 (two) times daily with a meal. 180 tablet 2   cholecalciferol (VITAMIN D3) 25 MCG (1000 UNIT) tablet Take 1,000 Units by mouth daily.     colchicine 0.6 MG tablet Take 0.3 mg by mouth daily as needed (Gout).     finasteride (PROSCAR) 5 MG tablet Take 5 mg by mouth daily.      furosemide (LASIX) 20 MG tablet Take 20 mg by mouth daily.     hydrALAZINE (APRESOLINE) 10 MG tablet Take 10 mg by mouth 3 (three) times daily.     isosorbide mononitrate (IMDUR) 30 MG 24 hr tablet Take 1 tablet (30 mg total) by mouth daily. 90 tablet 3   tamsulosin (FLOMAX) 0.4 MG CAPS capsule Take 0.4 mg by mouth daily.     potassium chloride SA (K-DUR,KLOR-CON) 20 MEQ tablet Take 1 tablet (20 mEq total) by mouth daily. 30 tablet 11   No current facility-administered medications for this visit.    Allergies:   Statins, Nsaids, Tolmetin, Atorvastatin, and Pravastatin   Social History:  The patient  reports that he has never smoked. He has never used smokeless tobacco. He reports current alcohol use of about 2.0 standard drinks of alcohol per week. He reports that he does not use drugs.   Family History:  The patient's family history includes Pancreatic cancer in his mother.    ROS:  Please see the history of present illness.   Otherwise, review of systems is positive for none.   All other systems are reviewed and negative.   PHYSICAL EXAM: VS:  BP 130/78   Pulse 60   Ht 5\' 7"  (1.702 m)   Wt 157 lb 0.6 oz (71.2 kg)   SpO2 99%   BMI 24.60 kg/m  , BMI Body mass index is 24.6 kg/m. GEN: Well nourished, well developed, in no acute distress  HEENT: normal  Neck: no JVD, carotid bruits, or masses Cardiac: RRR; no murmurs, rubs, or gallops,1-2+ edema  Respiratory:  clear to auscultation bilaterally, normal work of breathing GI: soft, nontender, nondistended, + BS MS: no deformity or atrophy  Skin: warm and dry, device site well healed Neuro:  Strength and sensation are intact Psych: euthymic mood, full affect  EKG:  EKG is ordered today. Personal review of the ekg ordered shows AV paced  Personal review of the device interrogation today. Results in Paceart    Recent Labs: 09/10/2021: ALT 18; NT-Pro BNP 7,964 05/04/2022: BUN 41; Creatinine, Ser 2.59; Hemoglobin 12.6; Platelets  213; Potassium 4.8; Sodium 143    Lipid Panel     Component Value Date/Time   CHOL 145 09/10/2021 1124   TRIG 97 09/10/2021 1124   HDL 42 09/10/2021 1124   CHOLHDL 3.5 09/10/2021 1124   CHOLHDL 3.7 07/13/2012 1434   VLDL 20 07/13/2012 1434   LDLCALC 85 09/10/2021 1124     Wt Readings from Last 3 Encounters:  08/24/22 157 lb 0.6 oz (71.2 kg)  06/17/22 157 lb (71.2 kg)  05/08/22 150 lb (68 kg)      Other studies Reviewed: Additional studies/ records that were reviewed today include: TTE 09/12/21  Review of the above records today demonstrates:   1. Apical LV thrombus is present. Left ventricular ejection fraction, by  estimation, is 20 to 25%.  The left ventricle has severely decreased  function. The left ventricle demonstrates regional wall motion  abnormalities (see scoring diagram/findings for  description). Left ventricular diastolic parameters are consistent with  Grade III diastolic dysfunction (restrictive). Elevated left atrial  pressure.   2. Right ventricular systolic function is mildly reduced. The right  ventricular size is moderately enlarged. There is moderately elevated  pulmonary artery systolic pressure.   3. Left atrial size was severely dilated.   4. Right atrial size was severely dilated.   5. The mitral valve is normal in structure. Mild mitral valve  regurgitation. No evidence of mitral stenosis.   6. Tricuspid valve regurgitation is mild to moderate.   7. The aortic valve is tricuspid. Aortic valve regurgitation is mild.  Aortic valve sclerosis/calcification is present, without any evidence of  aortic stenosis.   8. The inferior vena cava is normal in size with <50% respiratory  variability, suggesting right atrial pressure of 8 mmHg.    ASSESSMENT AND PLAN:  1.  Chronic systolic heart failure: Due to ischemic cardiomyopathy.  Currently on optimal medical therapy per primary cardiology.  Status post Abbott CRT-D with generator change 05/08/2022.   Device functioning appropriately.  Sensing, threshold, impedance within normal limits.  He was recently traveling and eating salty foods.  He has lower extremity edema.  Craven Crean take Lasix 40 mg daily for the next 4 days.  2.  Paroxysmal atrial fibrillation: CHA2DS2-VASc of 4.  Currently on Eliquis.  Minimal episodes.  3.  Coronary artery disease: Status post CABG.  No current angina.  Plan per primary cardiology.  4.  Secondary hypercoagulable state: Currently on Eliquis for atrial fibrillation    Current medicines are reviewed at length with the patient today.   The patient does not have concerns regarding his medicines.  The following changes were made today: None  Labs/ tests ordered today include:  Orders Placed This Encounter  Procedures   EKG 12-Lead     Disposition:   FU 12 months  Signed, Maclaine Ahola Jorja Loa, MD  08/24/2022 2:44 PM     [facility] [address] [place] [address] [phone] (office) [phone] (fax)

## 2022-08-24 NOTE — Patient Instructions (Addendum)
Medication Instructions:  Your physician has recommended you make the following change in your medication:  INCREASE Lasix to 40 mg daily for the next 4 days, then return to normal dosing of 20 mg daily  *If you need a refill on your cardiac medications before your next appointment, please call your pharmacy*   Lab Work: None ordered If you have labs (blood work) drawn today and your tests are completely normal, you will receive your results only by: MyChart Message (if you have MyChart) OR A paper copy in the mail If you have any lab test that is abnormal or we need to change your treatment, we will call you to review the results.   Testing/Procedures: None ordered   Follow-Up: At [facility], you and your health needs are our priority.  As part of our continuing mission to provide you with exceptional heart care, we have created designated Provider Care Teams.  These Care Teams include your primary Cardiologist (physician) and Advanced Practice Providers (APPs -  Physician Assistants and Nurse Practitioners) who all work together to provide you with the care you need, when you need it.  Remote monitoring is used to monitor your Pacemaker or ICD from home. This monitoring reduces the number of office visits required to check your device to one time per year. It allows [place] to keep an eye on the functioning of your device to ensure it is working properly. You are scheduled for a device check from home on 11/09/2022. You may send your transmission at any time that day. If you have a wireless device, the transmission will be sent automatically. After your physician reviews your transmission, you will receive a postcard with your next transmission date.  Your next appointment:   1 year(s)  The format for your next appointment:   In Person  Provider:   Loman Brooklyn, MD    Thank you for choosing [facility]!!   Dory Horn, RN [phone]

## 2022-09-08 ENCOUNTER — Ambulatory Visit: Payer: Medicare HMO | Attending: Cardiology

## 2022-09-08 ENCOUNTER — Other Ambulatory Visit: Payer: Self-pay | Admitting: Cardiology

## 2022-09-08 DIAGNOSIS — Z95810 Presence of automatic (implantable) cardiac defibrillator: Secondary | ICD-10-CM | POA: Diagnosis not present

## 2022-09-08 DIAGNOSIS — I255 Ischemic cardiomyopathy: Secondary | ICD-10-CM

## 2022-09-08 DIAGNOSIS — I5022 Chronic systolic (congestive) heart failure: Secondary | ICD-10-CM

## 2022-09-08 NOTE — Progress Notes (Unsigned)
EPIC Encounter for ICM Monitoring  Patient Name: Dave Daniels is a 82 y.o. male Date: 09/08/2022 Primary Care Physican: Cheron Schaumann., MD Primary Cardiologist: Jens Som Electrophysiologist: Elberta Fortis Nephrologist: [facility] Bi-V Pacing: >99%    08/13/2021 Weight: 152-153 lbs 02/03/2022 Weight: 152 lbs 03/04/2022 Weight: 150-152 lbs 07/03/2022 Weight: 150 lbs        AT/AF Burden 0% (taking Eliquis)                                    Attempted call to patient and unable to reach.   Transmission reviewed.    Corvue thoracic impedance suggesting possible fluid accumulation starting 5/18 and also from 5/8-5/15.  Per 5/13 OV note, Dr Elberta Fortis provided instructions to take lasix 40 mg daily x 4 days for LLE and then return to 20 mg daily.   Prescribed:  Furosemide 20 mg take 1 tablet(s) (20 mg total) by mouth daily   Potassium 20 mEq 1 tablet daily.   Labs: 05/04/2022 Creatinine 2.59, BUN 41, Potassium 4.8, Sodium 143, GFR 24 02/16/2022 Creatinine 2.71, BUN 37, Potassium 4.4, Sodium 142, GFR 23 A complete set of results can be found in Results Review   Recommendations:  Unable to reach.      Follow-up plan: ICM clinic phone appointment on 09/15/2022 to recheck fluid levels.   91 day device clinic remote transmission 11/09/2022.     EP/Cardiology Office Visits: 08/24/2022 with Dr Jens Som.   12/16/2022 with Dr Jens Som.   Copy of ICM check sent to Dr. Elberta Fortis.  Will send to Dr Jens Som for review if patient is reached.   3 month ICM trend: 09/08/2022.    12-14 Month ICM trend:     Karie Soda, RN 09/08/2022 9:06 AM

## 2022-09-09 ENCOUNTER — Telehealth: Payer: Self-pay

## 2022-09-09 NOTE — Telephone Encounter (Signed)
Remote ICM transmission received.  Attempted call to patient regarding ICM remote transmission and no answer.  Voice mail box full. °

## 2022-09-10 ENCOUNTER — Other Ambulatory Visit: Payer: Self-pay | Admitting: Cardiology

## 2022-09-10 DIAGNOSIS — I480 Paroxysmal atrial fibrillation: Secondary | ICD-10-CM

## 2022-09-10 NOTE — Telephone Encounter (Signed)
Prescription refill request for Eliquis received. Indication: Afib  Last office visit: 08/24/22 ([place])  Scr: 2.59 (05/04/22)  Age: 82 Weight: 71.2kg  Appropriate dose. Refill sent.

## 2022-09-10 NOTE — Progress Notes (Signed)
Remote ICD transmission.   

## 2022-09-14 ENCOUNTER — Other Ambulatory Visit: Payer: Self-pay | Admitting: Cardiology

## 2022-09-14 ENCOUNTER — Telehealth: Payer: Self-pay | Admitting: Cardiology

## 2022-09-14 NOTE — Telephone Encounter (Signed)
Pt c/o medication issue:  1. Name of Medication:   apixaban (ELIQUIS) 2.5 MG TABS tablet    2. How are you currently taking this medication (dosage and times per day)?   Take 1 tablet (2.5 mg total) by mouth 2 (two) times daily.     3. Are you having a reaction (difficulty breathing--STAT)? No  4. What is your medication issue? Pt states that he would like for dosage to be wrote for 5MG  tablet for 60 tablets. Pt states that the cost is lower if script is wrote this way. Pt would ike for refill request be sent to pharmacy on file once changed as well as a callback. Please advise

## 2022-09-14 NOTE — Telephone Encounter (Signed)
Voice mail is full  

## 2022-09-14 NOTE — Telephone Encounter (Signed)
OK to change dose to 5mg  taking 1/2 tablet two times a day??

## 2022-09-14 NOTE — Telephone Encounter (Signed)
Please explain to patient that Eliquis 5mg  is not designed to be cut. There is no way to ensure each 1/2 will have the same amount of active medication. If her price is too expensive, she can try to apply for patient assistance

## 2022-09-14 NOTE — Telephone Encounter (Addendum)
Pt is on Eliquis 2.5mg   Refill for Eliquis 2.5mg  sent in on 09/10/2022

## 2022-09-15 ENCOUNTER — Ambulatory Visit: Payer: Medicare HMO | Attending: Cardiology

## 2022-09-15 ENCOUNTER — Telehealth: Payer: Self-pay | Admitting: Cardiology

## 2022-09-15 DIAGNOSIS — Z95810 Presence of automatic (implantable) cardiac defibrillator: Secondary | ICD-10-CM

## 2022-09-15 DIAGNOSIS — I5022 Chronic systolic (congestive) heart failure: Secondary | ICD-10-CM

## 2022-09-15 NOTE — Telephone Encounter (Signed)
Spoke with patient regardig medication., Gave information from pharmacy as well as clarified he is to take 2.5 mg Two times a day.  He ask again then he can take the 5 mg tablet.  Noted he needs to take it twice a day and he cannot do 5 mg one time a day.  He states will be expensive.  Advised we can try patietn assistance.  He will bring his tax return or proof of income to his appt. For next Monday.  He will ask for form and fill out while in office.  He is unable to get on line.

## 2022-09-15 NOTE — Telephone Encounter (Signed)
Spoke with pt, he reports he started swelling about 1 month ago. He does not indorse orthopnea but reports there are times when he is uncomfortable. His dry weight is 148 lb and he reports his weight can go up as high as 153 lb but has been stable at 150 lb. His medical doctor increase his furosemide to twice daily and he has started that today. He was instructed to do that for 2 days only. First available APP appointment made for the patient 09/21/22. He will call me back on Thursday this week to see how his weight and breathing is doing so we can see if he needs another day of BID lasix or needs to be seen.

## 2022-09-15 NOTE — Telephone Encounter (Signed)
Patient states his labs results was sent by PCP. He would like for you to review them and let him know can he wait to see you in September or does he need to be seen sooner

## 2022-09-15 NOTE — Progress Notes (Signed)
EPIC Encounter for ICM Monitoring  Patient Name: Dave Daniels is a 82 y.o. male Date: 09/15/2022 Primary Care Physican: Cheron Schaumann., MD Primary Cardiologist: Jens Som Electrophysiologist: Elberta Fortis Nephrologist: [facility] Bi-V Pacing: >99%    08/13/2021 Weight: 152-153 lbs 02/03/2022 Weight: 152 lbs 03/04/2022 Weight: 150-152 lbs 07/03/2022 Weight: 150 lbs        AT/AF Burden 0% (taking Eliquis)                                    Transmission reviewed.   Per 6/4 phone note pt spoke with Dr Ludwig Clarks office regarding fluid symptoms of weight gain, swelling and was instructed by PCP to increase Lasix to bid x 2 days.   Cardiology appt made for 6/10 for evaluation.    Corvue thoracic impedance suggesting possible fluid accumulation from  5/8-5/15 and 5/18-5/28 and returned to normal 5/29.  Per 5/13 OV note, Dr Elberta Fortis provided instructions to take lasix 40 mg daily x 4 days for LLE and then return to 20 mg daily and .   Prescribed:  Furosemide 20 mg take 1 tablet(s) (20 mg total) by mouth daily   Potassium 20 mEq 1 tablet daily.   Labs: 09/14/2022 Creatinine 2.14, BUN 29, Potassium 4.6, Sodium 143, GFR 30, BNP 2,262 05/04/2022 Creatinine 2.59, BUN 41, Potassium 4.8, Sodium 143, GFR 24 02/16/2022 Creatinine 2.71, BUN 37, Potassium 4.4, Sodium 142, GFR 23 A complete set of results can be found in Results Review   Recommendations:  Pt corresponding with Dr Jens Som for recommendations.     Follow-up plan: ICM clinic phone appointment on 09/21/2022 (manual) to recheck fluid levels.   91 day device clinic remote transmission 11/09/2022.     EP/Cardiology Office Visits:  09/21/2022 with Marjie Skiff, NP.   12/16/2022 with Dr Jens Som.   Copy of ICM check sent to Dr. Elberta Fortis.  Sent to Dr Jens Som as Lorain Childes.   3 month ICM trend: 09/15/2022.    12-14 Month ICM trend:     Karie Soda, RN 09/15/2022 2:43 PM

## 2022-09-15 NOTE — Telephone Encounter (Signed)
  Pt said, his pcp did lab work and was told he need to see his cardiologist due to abnormal result. He said, Dr. Frederik Pear office sent the lab result to Dr. Jens Som.

## 2022-09-17 ENCOUNTER — Telehealth: Payer: Self-pay | Admitting: Cardiology

## 2022-09-17 NOTE — Telephone Encounter (Signed)
Spoke with pt, he reports the swelling is down in his legs and his weight is down 3 lbs. He will go back to the once daily furosemide. He is already taking antibiotics for the infection. He has an appointment Monday but will call us prior to that appointment with issues or concerns.

## 2022-09-17 NOTE — Telephone Encounter (Signed)
Patient is requesting to speak with Stanton Kidney, RN regarding his condition. He mentions that he has pneumonia and other issues.

## 2022-09-17 NOTE — Telephone Encounter (Signed)
Spoke to patient he requested to speak to Dr.Crenshaw's RN Stanton Kidney.Stated he had a cxr which showed he has pneumonia.He started on antibiotic today.I will send message to [facility].

## 2022-09-19 NOTE — Progress Notes (Unsigned)
Cardiology Office Note:    Date:  09/19/2022   ID:  Boston Service, DOB 1941-03-13, MRN [id number]  PCP:  Cheron Schaumann., MD  Cardiologist:  Olga Millers, MD  Electrophysiologist:  Regan Lemming, MD   Referring MD: Cheron Schaumann.,*   Chief Complaint: follow-up of CHF  History of Present Illness:    Dave Daniels is a 82 y.o. 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 Jens Som and Dr. Elberta Fortis and presents today for follow-up of CHF.  Patient has a long history of CAD. He had his first MI in 10-27-2008 in [place] and had stents placed at that time. He then had another MI in 06/2010 and underwent CABG x3 with LIMA to LAD, reverse SVG to OM, and reverse SVG to RPLA. He was admitted for another NSTEMI in 11/2010 in [place] and LHC at that time showed patent grafts but critical LCx disease. EF was 20%. Continued medical therapy was recommended at that time. He ultimately underwent ICD (CRT-D) in 03/2011 for primary prevention of sudden cardiac death. Last Echo in October 27, 2021 showed LVEF of 20-25% with multiple wall motion abnormalities (akinesis of the entire anterior septum, mid inferoseptal segment, apical anterior segment, and apex and hypokinesis of the anterior wall, entire lateral wall, entire inferior wall, and basal inferoseptal segment) and grade III diastolic dysfunction as well as moderate enlarged RV with mildly reduced systolic function, severe biatrial enlargement, mild MR, mild to moderate TR, and moderately elevated PASP. Also showed a reported LV apical thrombus but per Dr. Ludwig Clarks review of study there was apical swirling but no clear thrombus noted.  He was continued on  Eliquis which was already taking for atrial fibrillation. He underwent a generator change in 04/2022.   He was last seen by Dr. Jens Som in 06/2022 at which time he was stable from a cardiac standpoint and denied any chest pain or dyspnea.   He was recently seen his PCP at [facility] at [facility] at which time he reported dyspnea on exertion as well as worsening lower extremity edema and testicular swelling. BNP was markedly elevated at 2,480. Chest x-ray showed right lobar pneumonia with a parapneumonic effusion. He was started on antibiotics and ***.  Patient presents today for follow-up. ***  CAD s/p CABG History of prior stenting in Oct 27, 2008 and then CABG x3 in 06/2010 (both in the setting of MI). Last LHC in 03/2011 (again in setting of NSTEMI) showed patent grafts with critical LCX disease. Medical therapy was recommended at that time.  - No chest pain.  - Continue Coreg 12.5mg  twice daily and Imdur 30mg  daily.  - No aspirin due to need for full anticoagulation.  - Intolerant to statins in the past. Continue Nexlizet 180-10mg  daily.   Ischemic Cardiomyopathy Chronic HFrEF Last Echo in October 27, 2021 showed LVEF of 20-25% with multiple wall motion abnormalities and grade III diastolic dysfunction as well as moderate enlarged RV with mildly reduced systolic function, severe biatrial enlargement, mild MR, mild to moderate TR, and moderately elevated PASP. Also showed an apical LV thrombus.  - Euvolemic on exam. *** - Continue Coreg 12.5mg  twice daily.  - Continue Hydralazine 10mg  three times daily and Imdur 30mg   daily. ' - Not on Entresto, Spironolactone, or SGLT2 inhibitor given renal function.  - Dicussed importance of daily weight and sodium/ fluid restrictions.   LV Apical Thrombus Last Echo in 09/2021 showed apical thrombus. Although per Dr. Ludwig Clarks review of study, it showed apical swirling but no clear thrombus.  - Continue Eliquis 5mg  twice daily.  Paroxysmal Atrial  Fibrillation Maintaining sinus rhythm on exam.  - Continue Coreg 12.5mg  twice daily.  - Continue Eliquis 5mg  twice daily.   S/p ICD S/p CRT-D in 03/2011 for primary prevention of sudden cardiac death and then subsequent gen change in 04/2022.  - Followed by Dr. Elberta Fortis.   Hypertension BP well controlled.  - Continue medications for CHF as above.   Hyperlipidemia Lipid panel in 03/2022: Total Cholesterol 144, Triglycerides 95, HDL 45, LDL 80. LDL goal <55 given multiple MIs. - Intolerant to statins.  - Continue Nexlizet 180-10mg  daily.  - PCSK9 inhibitor ***  CKD Stage III Baseline creatinine around 2.0. Stable at 2.06 on most recent labs on 09/16/2022.  - Followed by Nephrology.   Past Medical History:  Diagnosis Date   CAD (coronary artery disease)    MI in [place] with stenting in 2010, then MI with CABG in [place] 06/2010.  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   Complete heart block (HCC)    Gout    Hyperlipidemia    Hypertension    Ischemic cardiomyopathy    EF 15% by echo 11/2010 and still 20-25% by follow-up echo 02/2011, s/p [employer] Bi-V ICD implantation 04/01/11   LBBB (left bundle branch block)    Renal insufficiency    Cr 1.6 on 03/25/11    Past Surgical History:  Procedure Laterality Date   BI-VENTRICULAR IMPLANTABLE CARDIOVERTER DEFIBRILLATOR N/A 04/01/2011   Procedure: BI-VENTRICULAR IMPLANTABLE CARDIOVERTER DEFIBRILLATOR  (CRT-D);  Surgeon: Marinus Maw, MD;  Location: [facility] CATH LAB;  Service: Cardiovascular;  Laterality: N/A;   BIV ICD GENERATOR CHANGEOUT N/A 05/08/2022   Procedure: BIV ICD GENERATOR CHANGEOUT;  Surgeon: Regan Lemming, MD;  Location: [facility] INVASIVE CV LAB;  Service: Cardiovascular;  Laterality: N/A;   CARDIAC DEFIBRILLATOR PLACEMENT  12/12   BiV ICD (SJM) implanted by Dr Johney Frame   CARDIOVERSION N/A 04/22/2018   Procedure: CARDIOVERSION;  Surgeon: Quintella Reichert, MD;  Location: [facility] ENDOSCOPY;  Service: Cardiovascular;  Laterality: N/A;    CARDIOVERSION N/A 06/13/2019   Procedure: CARDIOVERSION;  Surgeon: Quintella Reichert, MD;  Location: [facility] ENDOSCOPY;  Service: Cardiovascular;  Laterality: N/A;   Carpel tunnel surgery     CORONARY ARTERY BYPASS GRAFT  3/12   in [place]   EP IMPLANTABLE DEVICE N/A 08/19/2015   Procedure: BIV ICD Generator Changeout;  Surgeon: Hillis Range, MD;  Location: [facility] INVASIVE CV LAB;  Service: Cardiovascular;  Laterality: N/A;   INGUINAL HERNIA REPAIR     TONSILLECTOMY     UMBILICAL HERNIA REPAIR      Current Medications: No outpatient medications have been marked as taking for the 09/21/22 encounter (Appointment) with Corrin Parker, PA-C.     Allergies:   Statins, Nsaids, Tolmetin, Atorvastatin, and Pravastatin   Social History   Socioeconomic History   Marital status: Married    Spouse name: Not on file   Number of children: 4   Years of education: Not on file   Highest education level: Not on file  Occupational History    Comment: Retired  Tobacco Use   Smoking status: Never   Smokeless tobacco: Never  Vaping Use  Vaping Use: Never used  Substance and Sexual Activity   Alcohol use: Yes    Alcohol/week: 2.0 standard drinks of alcohol    Types: 2 Glasses of wine per week    Comment: occasional   Drug use: No   Sexual activity: Yes  Other Topics Concern   Not on file  Social History Narrative   Lives in [place], recently moved from [place].  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   Social Determinants of Corporate investment banker Strain: Not on file  Food Insecurity: Not on file  Transportation Needs: Not on file  Physical Activity: Not on file  Stress: Not on file  Social Connections: Not on file     Family History: The patient's family history includes Pancreatic cancer in his mother.  ROS:   Please see the history of present illness.    EKGs/Labs/Other Studies Reviewed:    The following studies were reviewed:  Echocardiogram 09/12/2021: Impressions:  1. Apical LV thrombus is  present. Left ventricular ejection fraction, by  estimation, is 20 to 25%. The left ventricle has severely decreased  function. The left ventricle demonstrates regional wall motion  abnormalities (see scoring diagram/findings for  description). Left ventricular diastolic parameters are consistent with  Grade III diastolic dysfunction (restrictive). Elevated left atrial  pressure.   2. Right ventricular systolic function is mildly reduced. The right  ventricular size is moderately enlarged. There is moderately elevated  pulmonary artery systolic pressure.   3. Left atrial size was severely dilated.   4. Right atrial size was severely dilated.   5. The mitral valve is normal in structure. Mild mitral valve  regurgitation. No evidence of mitral stenosis.   6. Tricuspid valve regurgitation is mild to moderate.   7. The aortic valve is tricuspid. Aortic valve regurgitation is mild.  Aortic valve sclerosis/calcification is present, without any evidence of  aortic stenosis.   8. The inferior vena cava is normal in size with <50% respiratory  variability, suggesting right atrial pressure of 8 mmHg.    EKG:  EKG not ordered today.   Recent Labs: 05/04/2022: BUN 41; Creatinine, Ser 2.59; Hemoglobin 12.6; Platelets 213; Potassium 4.8; Sodium 143  Recent Lipid Panel    Component Value Date/Time   CHOL 145 09/10/2021 1124   TRIG 97 09/10/2021 1124   HDL 42 09/10/2021 1124   CHOLHDL 3.5 09/10/2021 1124   CHOLHDL 3.7 07/13/2012 1434   VLDL 20 07/13/2012 1434   LDLCALC 85 09/10/2021 1124    Physical Exam:    Vital Signs: There were no vitals taken for this visit.    Wt Readings from Last 3 Encounters:  08/24/22 157 lb 0.6 oz (71.2 kg)  06/17/22 157 lb (71.2 kg)  05/08/22 150 lb (68 kg)     General: 82 y.o. male in no acute distress. HEENT: Normocephalic and atraumatic. Sclera clear. EOMs intact. Neck: Supple. No carotid bruits. No JVD. Heart: *** RRR. Distinct S1 and S2. No murmurs,  gallops, or rubs. Radial and distal pedal pulses 2+ and equal bilaterally. Lungs: No increased work of breathing. Clear to ausculation bilaterally. No wheezes, rhonchi, or rales.  Abdomen: Soft, non-distended, and non-tender to palpation. Bowel sounds present in all 4 quadrants.  MSK: Normal strength and tone for age. *** Extremities: No lower extremity edema.    Skin: Warm and dry. Neuro: Alert and oriented x3. No focal deficits. Psych: Normal affect. Responds appropriately.   Assessment:    No diagnosis found.  Plan:  Disposition: Follow up in ***   Medication Adjustments/Labs and Tests Ordered: Current medicines are reviewed at length with the patient today.  Concerns regarding medicines are outlined above.  No orders of the defined types were placed in this encounter.  No orders of the defined types were placed in this encounter.   There are no Patient Instructions on file for this visit.   Signed, Corrin Parker, PA-C  09/19/2022 9:37 AM    [facility]

## 2022-09-20 NOTE — Progress Notes (Unsigned)
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  He is here today for worsening shortness of breath, lower extremity swelling and elevated BNP from primary care.   Past Medical History    Past Medical History:  Diagnosis Date   CAD (coronary artery disease)    MI in [place] with stenting in 2010, then MI with CABG in [place] 06/2010.  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   Complete heart block (HCC)    Gout    Hyperlipidemia    Hypertension    Ischemic cardiomyopathy    EF 15% by echo 11/2010 and still 20-25% by follow-up echo 02/2011, s/p [employer] Bi-V ICD implantation 04/01/11   LBBB (left bundle branch block)    Renal insufficiency    Cr 1.6 on 03/25/11   Past Surgical History:  Procedure Laterality Date   BI-VENTRICULAR IMPLANTABLE CARDIOVERTER DEFIBRILLATOR N/A 04/01/2011   Procedure: BI-VENTRICULAR IMPLANTABLE CARDIOVERTER DEFIBRILLATOR  (CRT-D);  Surgeon: Marinus Maw, MD;  Location: [facility] CATH LAB;  Service: Cardiovascular;  Laterality: N/A;   BIV ICD GENERATOR CHANGEOUT N/A 05/08/2022   Procedure: BIV ICD GENERATOR CHANGEOUT;  Surgeon: Regan Lemming, MD;  Location: [facility] INVASIVE CV LAB;  Service: Cardiovascular;  Laterality: N/A;   CARDIAC DEFIBRILLATOR PLACEMENT  12/12   BiV ICD (SJM) implanted by Dr Johney Frame   CARDIOVERSION N/A 04/22/2018   Procedure: CARDIOVERSION;  Surgeon: Quintella Reichert, MD;  Location: [facility] ENDOSCOPY;  Service: Cardiovascular;  Laterality: N/A;   CARDIOVERSION N/A 06/13/2019   Procedure: CARDIOVERSION;  Surgeon: Quintella Reichert, MD;  Location: [facility] ENDOSCOPY;   Service: Cardiovascular;  Laterality: N/A;   Carpel tunnel surgery     CORONARY ARTERY BYPASS GRAFT  3/12   in [place]   EP IMPLANTABLE DEVICE N/A 08/19/2015   Procedure: BIV ICD Generator Changeout;  Surgeon: Hillis Range, MD;  Location: [facility] INVASIVE CV LAB;  Service: Cardiovascular;  Laterality: N/A;   INGUINAL HERNIA REPAIR     TONSILLECTOMY     UMBILICAL HERNIA REPAIR      Allergies  Allergies  Allergen Reactions   Statins Hives    Muscle pain & severe hives   Nsaids Other (See Comments)    Renal insufficiency  Renal insuff   Tolmetin Other (See Comments)    Renal insufficiency   Atorvastatin Rash   Pravastatin Rash    rash     Labs/Other Studies Reviewed    The following studies were reviewed today: Cardiac Studies & Procedures     STRESS TESTS  NM MYOCAR MULTI W/SPECT W 01/29/2011   ECHOCARDIOGRAM  ECHOCARDIOGRAM COMPLETE 09/12/2021  Narrative ECHOCARDIOGRAM REPORT    Patient Name:   [place] Service Date of Exam: 09/12/2021 Medical Rec #:  [id number]       Height:       67.0 in Accession #:    [id number]      Weight:       153.0 lb Date of Birth:  1940-11-28       BSA:          1.805 m Patient Age:    28 years  BP:           122/64 mmHg Patient Gender: M               HR:           60 bpm. Exam Location:  [place]  Procedure: 2D Echo, Cardiac Doppler, Color Doppler and Intracardiac Opacification Agent  Indications:    CHF  History:        Patient has prior history of Echocardiogram examinations, most recent 05/27/2020. CHF and Cardiomyopathy, CAD, Prior CABG, Pacemaker and Defibrillator, Arrythmias:LBBB; Risk Factors:Hypertension and Dyslipidemia.  Sonographer:    Roosvelt Maser RDCS Referring Phys: [id number] BRIAN S CRENSHAW  IMPRESSIONS   1. Apical LV thrombus is present. Left ventricular ejection fraction, by estimation, is 20 to 25%. The left ventricle has severely decreased function. The left ventricle demonstrates regional wall motion  abnormalities (see scoring diagram/findings for description). Left ventricular diastolic parameters are consistent with Grade III diastolic dysfunction (restrictive). Elevated left atrial pressure. 2. Right ventricular systolic function is mildly reduced. The right ventricular size is moderately enlarged. There is moderately elevated pulmonary artery systolic pressure. 3. Left atrial size was severely dilated. 4. Right atrial size was severely dilated. 5. The mitral valve is normal in structure. Mild mitral valve regurgitation. No evidence of mitral stenosis. 6. Tricuspid valve regurgitation is mild to moderate. 7. The aortic valve is tricuspid. Aortic valve regurgitation is mild. Aortic valve sclerosis/calcification is present, without any evidence of aortic stenosis. 8. The inferior vena cava is normal in size with <50% respiratory variability, suggesting right atrial pressure of 8 mmHg.  FINDINGS Left Ventricle: Apical LV thrombus is present. Left ventricular ejection fraction, by estimation, is 20 to 25%. The left ventricle has severely decreased function. The left ventricle demonstrates regional wall motion abnormalities. The left ventricular internal cavity size was normal in size. There is no left ventricular hypertrophy. Left ventricular diastolic parameters are consistent with Grade III diastolic dysfunction (restrictive). Elevated left atrial pressure.   LV Wall Scoring: The entire anterior septum, mid inferoseptal segment, apical anterior segment, and apex are akinetic. The anterior wall, entire lateral wall, entire inferior wall, and basal inferoseptal segment are hypokinetic.  Right Ventricle: The right ventricular size is moderately enlarged. No increase in right ventricular wall thickness. Right ventricular systolic function is mildly reduced. There is moderately elevated pulmonary artery systolic pressure. The tricuspid regurgitant velocity is 3.51 m/s, and with an assumed right  atrial pressure of 8 mmHg, the estimated right ventricular systolic pressure is 57.3 mmHg.  Left Atrium: Left atrial size was severely dilated.  Right Atrium: Right atrial size was severely dilated.  Pericardium: There is no evidence of pericardial effusion.  Mitral Valve: The mitral valve is normal in structure. Mild mitral valve regurgitation. No evidence of mitral valve stenosis.  Tricuspid Valve: The tricuspid valve is normal in structure. Tricuspid valve regurgitation is mild to moderate. No evidence of tricuspid stenosis.  Aortic Valve: The aortic valve is tricuspid. Aortic valve regurgitation is mild. Aortic regurgitation PHT measures 467 msec. Aortic valve sclerosis/calcification is present, without any evidence of aortic stenosis. Aortic valve mean gradient measures 5.0 mmHg. Aortic valve peak gradient measures 8.1 mmHg. Aortic valve area, by VTI measures 1.32 cm.  Pulmonic Valve: The pulmonic valve was normal in structure. Pulmonic valve regurgitation is mild to moderate. No evidence of pulmonic stenosis.  Aorta: The aortic arch was not well visualized and the aortic root and ascending aorta are structurally normal, with no evidence of dilitation.  Venous: The pulmonary veins were not well visualized. The inferior vena cava is normal in size with less than 50% respiratory variability, suggesting right atrial pressure of 8 mmHg.  IAS/Shunts: No atrial level shunt detected by color flow Doppler.  Additional Comments: A device lead is visualized in the right atrium and right ventricle.   LEFT VENTRICLE PLAX 2D LVIDd:         6.30 cm LVIDs:         5.10 cm LV PW:         0.80 cm LV IVS:        0.70 cm LVOT diam:     1.90 cm LV SV:         41 LV SV Index:   23 LVOT Area:     2.84 cm  LV Volumes (MOD) LV vol d, MOD A2C: 214.0 ml LV vol d, MOD A4C: 216.0 ml LV vol s, MOD A2C: 189.0 ml LV vol s, MOD A4C: 178.0 ml LV SV MOD A2C:     25.0 ml LV SV MOD A4C:     216.0  ml LV SV MOD BP:      30.7 ml  RIGHT VENTRICLE RV Basal diam:  5.20 cm RV Mid diam:    4.50 cm RV S prime:     8.93 cm/s TAPSE (M-mode): 2.0 cm  LEFT ATRIUM              Index        RIGHT ATRIUM           Index LA diam:        5.00 cm  2.77 cm/m   RA Area:     29.30 cm LA Vol (A2C):   63.9 ml  35.41 ml/m  RA Volume:   114.00 ml 63.17 ml/m LA Vol (A4C):   109.0 ml 60.40 ml/m LA Biplane Vol: 91.6 ml  50.76 ml/m AORTIC VALVE AV Area (Vmax):    1.45 cm AV Area (Vmean):   1.30 cm AV Area (VTI):     1.32 cm AV Vmax:           142.00 cm/s AV Vmean:          101.000 cm/s AV VTI:            0.312 m AV Peak Grad:      8.1 mmHg AV Mean Grad:      5.0 mmHg LVOT Vmax:         72.50 cm/s LVOT Vmean:        46.400 cm/s LVOT VTI:          0.145 m LVOT/AV VTI ratio: 0.46 AI PHT:            467 msec  AORTA Ao Root diam: 3.40 cm Ao Asc diam:  3.80 cm  TRICUSPID VALVE TR Peak grad:   49.3 mmHg TR Vmax:        351.00 cm/s  SHUNTS Systemic VTI:  0.14 m Systemic Diam: 1.90 cm  Norman Herrlich MD Electronically signed by Norman Herrlich MD Signature Date/Time: 09/12/2021/5:45:02 PM    Final            Recent Labs: 05/04/2022: BUN 41; Creatinine, Ser 2.59; Hemoglobin 12.6; Platelets 213; Potassium 4.8; Sodium 143  Recent Lipid Panel    Component Value Date/Time   CHOL 145 09/10/2021 1124   TRIG 97 09/10/2021 1124   HDL 42 09/10/2021 1124   CHOLHDL 3.5 09/10/2021 1124   CHOLHDL  3.7 07/13/2012 1434   VLDL 20 07/13/2012 1434   LDLCALC 85 09/10/2021 1124   History of Present Illness   82 year old male past medical history of coronary artery disease s/p CABG in 2012, hypertension, hyperlipidemia, 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   Mr. Stokes had an MI in 2010 with stent placement while in [place].  In March 2012 he had a second myocardial infarction and underwent  CABGx3 (LIMA to LAD, reverse SVG to OMG, reverse SVG to RPLA). Admitted again for another NSTEMI in August, cardiac catheterization at that time showed his EF was 20%, all three grafts were patent but he had critical circumflex disease, medical therapy was recommended at that time. He had biventricular ICD placement in December 2012 for primary prevention of sudden cardiac death. He had DCCV for atrial fibrillation in 3/21 with successful return to sinus rhythm. Echo in June 2023 his EF was 20 to 25%, grade 3 diastolic dysfunction, apical thrombus, moderate right ventricular enlargement mild RV dysfunction, severe biatrial enlargement, mild mitral regurgitation, mild aortic insufficiency.  January 2024 he had successful CRT-D pulse generator replacement with Dr. Elberta Fortis.   On 6/4 report of ICM monitoring indicates 0% afib burden, Corvue thoracic impedance suggesting possible fluid accumulation from 5/8-5/15 then 5/18 through 5/28 with return to normal 5/29. On chart review he saw Dr. Elberta Fortis 5/13, his lasix was increased to BID for 2 days for bilateral lower extremity edema, he then resumed Lasix 20mg  daily. On 6/4 his PCP started him back on Lasix 20mg  BID. On labs from 09/16/22 his BNP was elevated to 2,480, he also had a chest xray that indicated that he has right lobar pneumonia and parapneumonic effusion, was started on antibiotics by his PCP on 09/17/22.   Today he reports he is feeling significantly better. His shortness of breath has greatly improved. Notes he becomes slightly dyspneic while climbing the stairs in his house but notes this has improved in the last three days. His bilateral lower extremity swelling has resolved and his energy is returning. Reports a decreased appetite since starting antibiotics four days ago. He denies chest pain, palpitations, pnd, orthopnea, dizziness or syncope.   Review from [facility] Thoracic impedance today showed impedance has been increasing the last few days indicating  improvement in fluid status.   Home Medications    Current Outpatient Medications  Medication Sig Dispense Refill   acetaminophen (TYLENOL) 500 MG tablet Take 1,000 mg by mouth as needed for moderate pain or headache.      allopurinol (ZYLOPRIM) 300 MG tablet Take 300 mg by mouth daily as needed (Gout).  11   apixaban (ELIQUIS) 2.5 MG TABS tablet Take 1 tablet (2.5 mg total) by mouth 2 (two) times daily. 180 tablet 1   Bempedoic Acid-Ezetimibe (NEXLIZET) 180-10 MG TABS Take 1 tablet by mouth once daily 30 tablet 11   carvedilol (COREG) 12.5 MG tablet Take 1 tablet (12.5 mg total) by mouth 2 (two) times daily with a meal. 180 tablet 2   cholecalciferol (VITAMIN D3) 25 MCG (1000 UNIT) tablet Take 1,000 Units by mouth daily.     colchicine 0.6 MG tablet Take 0.3 mg by mouth daily as needed (Gout).     finasteride (PROSCAR) 5 MG tablet Take 5 mg by mouth daily.     furosemide (LASIX) 20 MG tablet Take 20 mg by mouth daily.     hydrALAZINE (APRESOLINE) 10 MG  tablet Take 10 mg by mouth 3 (three) times daily.     isosorbide mononitrate (IMDUR) 30 MG 24 hr tablet Take 1 tablet by mouth once daily 90 tablet 3   potassium chloride SA (K-DUR,KLOR-CON) 20 MEQ tablet Take 1 tablet (20 mEq total) by mouth daily. 30 tablet 11   tamsulosin (FLOMAX) 0.4 MG CAPS capsule Take 0.4 mg by mouth daily.     No current facility-administered medications for this visit.     Review of Systems   All other systems reviewed and are otherwise negative except as noted above.    Physical Exam    VS:  BP 120/60 (BP Location: Right Arm, Patient Position: Sitting, Cuff Size: Normal)   Pulse 60   Ht 5\' 7"  (1.702 m)   Wt 146 lb 6.4 oz (66.4 kg)   SpO2 96%   BMI 22.93 kg/m  , BMI Body mass index is 22.93 kg/m.     GEN: Well nourished, well developed, in no acute distress. HEENT: normal. Neck: Supple, no JVD, carotid bruits, or masses. Cardiac: RRR, no murmurs, rubs, or gallops. No clubbing, edema or cyanosis.   Radials/DP/PT 2+ and equal bilaterally.  Respiratory:  Respirations regular and unlabored, clear to auscultation bilaterally. GI: Soft, nontender, nondistended, BS + x 4. MS: no deformity or atrophy. Skin: warm and dry, no rash. Neuro:  Strength and sensation are intact. Psych: Normal affect.  Accessory Clinical Findings    ECG not ordered today.   Lab Results  Component Value Date   WBC 7.7 05/04/2022   HGB 12.6 (L) 05/04/2022   HCT 39.2 05/04/2022   MCV 89 05/04/2022   PLT 213 05/04/2022   Lab Results  Component Value Date   CREATININE 2.59 (H) 05/04/2022   BUN 41 (H) 05/04/2022   NA 143 05/04/2022   K 4.8 05/04/2022   CL 105 05/04/2022   CO2 26 05/04/2022   Lab Results  Component Value Date   ALT 18 09/10/2021   AST 18 09/10/2021   ALKPHOS 67 09/10/2021   BILITOT 0.7 09/10/2021   Lab Results  Component Value Date   CHOL 145 09/10/2021   HDL 42 09/10/2021   LDLCALC 85 09/10/2021   TRIG 97 09/10/2021   CHOLHDL 3.5 09/10/2021    No results found for: "HGBA1C"  Assessment & Plan    Shortness of breath/Chronic systolic heart failure due to ischemic cardiomyopathy: Last Echo in June 2023 showed LVEF was 20 to 25% with multiple wall motion abnormalities and indicated grade III diastolic dysfunction. He has required intermittent increase in lasix in the last month, for increased lower extremity edema. His PCP increased again last week for a few days. Corvue thoracic impedance readings today are suggesting improvement in fluid status. Today reports his shortness of breath and lower extremity swelling has greatly improved, appears euvolemic on exam. He will continue to monitor his daily weight at home, discussed importance of salt/fluid restriction. Will continue furosemide 20mg  daily. Instructed patient that if he has weight gain of 2-3lbs overnight or 5 lbs in week, or notes lower extremity edema to take an additional dose of furosemide 20mg . Continue hydralazine 10mg  three  times a day and imdur 30mg  daily. Not on entresto, spironolactone or SGLT2 inhibtor given renal function.   Pneumonia: Chest xray on 09/16/22 indicated that he has right lobar pneumonia and parapneumonic effusion, was started on antibiotics by his PCP.   CAD: Prior stenting in 2010 followed by CABGx3 in 06/2010, both setting of MI. He  underwent LHC in 12/12 in setting of NSTEMI, that showed patent grafts with critical LCX disease. Today he denies chest pain. Continue coreg 12.5mg  twice daily, Nexlizet 180-10mg  daily and imdur 30mg  daily. Intolerant to statin. Repeat lipid panel.   Paroxysmal atrial fibrillation: Per CRT-D his afib burden has been 0%. Continue Coreg 12.5mg  twice daily and eliquis 2.5mg  twice daily.   LV Apical Thrombus: Last echo in 09/2021 showed apical thrombus. Per Dr. Ludwig Clarks review of study it showed apical swirling but no clear thrombus. Continue eliquis 2.5mg  twice daily.   S/P ICD: S/p CRT-D in 03/2011 for primary prevention of sudden cardiac death, he had generator change with Dr. Elberta Fortis in January 2024.  Hypertension: Blood pressure well controlled, BP today 120/60. Continue hydralazine 10mg  three times a day, isosorbide mononitrate 30mg  daily, furosemide 20mg  daily, carvedilol 12.5mg  twice daily.   CKD stage III: eGFR 32 and creatinine 2.06 on 09/16/22. Followed by Dr. Janit Pagan with [facility].   Hyperlipidemia: Last lipid panel was in May of 2023. Repeat fasting lipid panel. Continue Nexlizet.     Follow up scheduled with Dr. Jens Som for 12/16/22 at 11:40am.   Rip Harbour, NP 09/20/2022, 4:48 PM

## 2022-09-21 ENCOUNTER — Telehealth: Payer: Self-pay

## 2022-09-21 ENCOUNTER — Ambulatory Visit (INDEPENDENT_AMBULATORY_CARE_PROVIDER_SITE_OTHER): Payer: Medicare HMO

## 2022-09-21 ENCOUNTER — Encounter: Payer: Self-pay | Admitting: Student

## 2022-09-21 ENCOUNTER — Ambulatory Visit: Payer: Medicare HMO | Attending: Student | Admitting: Cardiology

## 2022-09-21 VITALS — BP 120/60 | HR 60 | Ht 67.0 in | Wt 146.4 lb

## 2022-09-21 DIAGNOSIS — I519 Heart disease, unspecified: Secondary | ICD-10-CM

## 2022-09-21 DIAGNOSIS — I4819 Other persistent atrial fibrillation: Secondary | ICD-10-CM

## 2022-09-21 DIAGNOSIS — N183 Chronic kidney disease, stage 3 unspecified: Secondary | ICD-10-CM

## 2022-09-21 DIAGNOSIS — Z95810 Presence of automatic (implantable) cardiac defibrillator: Secondary | ICD-10-CM

## 2022-09-21 DIAGNOSIS — I10 Essential (primary) hypertension: Secondary | ICD-10-CM | POA: Diagnosis not present

## 2022-09-21 DIAGNOSIS — I5022 Chronic systolic (congestive) heart failure: Secondary | ICD-10-CM

## 2022-09-21 DIAGNOSIS — I2510 Atherosclerotic heart disease of native coronary artery without angina pectoris: Secondary | ICD-10-CM

## 2022-09-21 DIAGNOSIS — E7800 Pure hypercholesterolemia, unspecified: Secondary | ICD-10-CM

## 2022-09-21 DIAGNOSIS — I2583 Coronary atherosclerosis due to lipid rich plaque: Secondary | ICD-10-CM

## 2022-09-21 MED ORDER — FUROSEMIDE 20 MG PO TABS: ORAL_TABLET | ORAL | 2 refills | Status: DC

## 2022-09-21 MED ORDER — APIXABAN 2.5 MG PO TABS: 2.5000 mg | ORAL_TABLET | Freq: Two times a day (BID) | ORAL | 0 refills | Status: DC

## 2022-09-21 NOTE — Telephone Encounter (Signed)
Spoke with patient and assisted sending remote transmission.

## 2022-09-21 NOTE — Telephone Encounter (Signed)
Attempted ICM call to patient and wife answered phone stated he was unavailable.  Will attempt to call back to request to send manual remote transmission.

## 2022-09-21 NOTE — Patient Instructions (Signed)
Medication Instructions:   You may take an additional 20 mg of Lasix as needed for weight gain of 3 lbs overnight and 5 lbs in a week and/or worsen of lower leg swelling   *If you need a refill on your cardiac medications before your next appointment, please call your pharmacy*  Lab Work: Your physician recommends that you return for a FASTING lipid profile in 2 months: DO NOT eat or drink past midnight. Okay to have water and/or black coffee only the morning of blood work.    If you have labs (blood work) drawn today and your tests are completely normal, you will receive your results only by: MyChart Message (if you have MyChart) OR A paper copy in the mail If you have any lab test that is abnormal or we need to change your treatment, we will call you to review the results.  Testing/Procedures: NONE ordered at this time of appointment   Follow-Up: At [facility], you and your health needs are our priority.  As part of our continuing mission to provide you with exceptional heart care, we have created designated Provider Care Teams.  These Care Teams include your primary Cardiologist (physician) and Advanced Practice Providers (APPs -  Physician Assistants and Nurse Practitioners) who all work together to provide you with the care you need, when you need it.  Your next appointment:   As previously scheduled    Provider:   Olga Millers, MD     Other Instructions

## 2022-09-21 NOTE — Progress Notes (Signed)
EPIC Encounter for ICM Monitoring  Patient Name: Dave Daniels is a 82 y.o. male Date: 09/21/2022 Primary Care Physican: Cheron Schaumann., MD Primary Cardiologist: Jens Som Electrophysiologist: Elberta Fortis Nephrologist: [facility] Bi-V Pacing: >99%    08/13/2021 Weight: 152-153 lbs 02/03/2022 Weight: 152 lbs 03/04/2022 Weight: 150-152 lbs 07/03/2022 Weight: 150 lbs        AT/AF Burden 0% (taking Eliquis)                                    Spoke with patient and heart failure questions reviewed.  Transmission results reviewed.  Pt is feeling better since taking Lasix bid x 2 days as instructed by PCP on 6/4.    Breathing and leg swelling have improved.    Corvue thoracic impedance suggesting fluid levels returned to normal 5/29.  Was suggesting possible fluid accumulation from 5/8-5/15 and 5/18-5/28.  Per 5/13 OV note, Dr Elberta Fortis provided instructions to take lasix 40 mg daily x 4 days for LLE and then return to 20 mg daily and .   Prescribed:  Furosemide 20 mg take 1 tablet(s) (20 mg total) by mouth daily   Potassium 20 mEq 1 tablet daily.   Labs: 09/14/2022 Creatinine 2.14, BUN 29, Potassium 4.6, Sodium 143, GFR 30, BNP 2,262 05/04/2022 Creatinine 2.59, BUN 41, Potassium 4.8, Sodium 143, GFR 24 A complete set of results can be found in Results Review   Recommendations:  Any Recommendations will be given at OV with Marjie Skiff, PA.     Follow-up plan: ICM clinic phone appointment on 10/12/2022.   91 day device clinic remote transmission 11/09/2022.     EP/Cardiology Office Visits:  09/21/2022 with Marjie Skiff, PA.   12/16/2022 with Dr Jens Som.   Copy of ICM check sent to Dr. Elberta Fortis.  Sent to Marjie Skiff, [place] as Lorain Childes for today's office visit.     3 month ICM trend: 09/21/2022.   12-14 Month ICM trend:     Karie Soda, RN 09/21/2022 11:53 AM

## 2022-10-10 ENCOUNTER — Other Ambulatory Visit: Payer: Self-pay | Admitting: Cardiology

## 2022-10-12 ENCOUNTER — Ambulatory Visit: Payer: Medicare HMO | Attending: Cardiology

## 2022-10-12 DIAGNOSIS — I5022 Chronic systolic (congestive) heart failure: Secondary | ICD-10-CM | POA: Diagnosis not present

## 2022-10-12 DIAGNOSIS — Z95810 Presence of automatic (implantable) cardiac defibrillator: Secondary | ICD-10-CM | POA: Diagnosis not present

## 2022-10-16 NOTE — Progress Notes (Signed)
EPIC Encounter for ICM Monitoring  Patient Name: Dave Daniels is a 82 y.o. male Date: 10/16/2022 Primary Care Physican: Cheron Schaumann., MD Primary Cardiologist: Jens Som Electrophysiologist: Elberta Fortis Nephrologist: [facility] Bi-V Pacing: >99%    08/13/2021 Weight: 152-153 lbs 02/03/2022 Weight: 152 lbs 03/04/2022 Weight: 150-152 lbs 07/03/2022 Weight: 150 lbs 10/16/2022 Weight: 142 -143 lbs lbs        AT/AF Burden 0% (taking Eliquis)                                    Spoke with patient and heart failure questions reviewed.  Transmission results reviewed.  Pt is doing well and has not needed any extra Furosemide since last MD visit.    Corvue thoracic impedance suggesting normal fluid levels within the next month.    Prescribed:  Furosemide 20 mg take 1 tablet(s) (20 mg total) by mouth daily.  May take an additional tablet by mouth as needed for weight gain of 3 lbs overnight or 5 lbs in a week and/or worsening leg swelling  Potassium 20 mEq 1 tablet daily.   Labs: 09/16/2022 Creatinine 2.06, BUN 30, Potassium 5.0, Sodium 145, GFR 32 09/14/2022 Creatinine 2.14, BUN 29, Potassium 4.6, Sodium 143, GFR 30, BNP 2,262 05/04/2022 Creatinine 2.59, BUN 41, Potassium 4.8, Sodium 143, GFR 24 A complete set of results can be found in Results Review   Recommendations:  No changes and encouraged to call if experiencing any fluid symptoms.   Follow-up plan: ICM clinic phone appointment on 11/16/2022.   91 day device clinic remote transmission 11/09/2022.     EP/Cardiology Office Visits:  Recall 08/19/2023 with Dr Elberta Fortis.   12/16/2022 with Dr Jens Som.   Copy of ICM check sent to Dr. Elberta Fortis.    3 month ICM trend: 10/12/2022.    12-14 Month ICM trend:     Karie Soda, RN 10/16/2022 3:06 PM

## 2022-11-09 ENCOUNTER — Ambulatory Visit (INDEPENDENT_AMBULATORY_CARE_PROVIDER_SITE_OTHER): Payer: Medicare HMO

## 2022-11-09 DIAGNOSIS — I519 Heart disease, unspecified: Secondary | ICD-10-CM | POA: Diagnosis not present

## 2022-11-09 DIAGNOSIS — I255 Ischemic cardiomyopathy: Secondary | ICD-10-CM

## 2022-11-14 ENCOUNTER — Other Ambulatory Visit: Payer: Self-pay | Admitting: Cardiology

## 2022-11-16 ENCOUNTER — Ambulatory Visit: Payer: Medicare HMO | Attending: Cardiology

## 2022-11-16 DIAGNOSIS — I5022 Chronic systolic (congestive) heart failure: Secondary | ICD-10-CM | POA: Diagnosis not present

## 2022-11-16 DIAGNOSIS — Z95810 Presence of automatic (implantable) cardiac defibrillator: Secondary | ICD-10-CM

## 2022-11-18 NOTE — Progress Notes (Signed)
EPIC Encounter for ICM Monitoring  Patient Name: Dave Daniels is a 82 y.o. male Date: 11/18/2022 Primary Care Physican: Cheron Schaumann., MD Primary Cardiologist: Jens Som Electrophysiologist: Elberta Fortis Nephrologist: [facility] Bi-V Pacing: >99%    08/13/2021 Weight: 152-153 lbs 02/03/2022 Weight: 152 lbs 03/04/2022 Weight: 150-152 lbs 07/03/2022 Weight: 150 lbs 10/16/2022 Weight: 142 -143 lbs lbs        AT/AF Burden 0% (taking Eliquis)                                    Spoke with patient and heart failure questions reviewed.  Transmission results reviewed.  Pt is doing well.  He had several celebrations during decreased impedance which may contribute to possible fluid accumulation.  He has not taken any extra fluid pills in the last month.     Corvue thoracic impedance suggesting possible dryness starting 7/26.  Also suggesting possible fluid accumulation from 7/11-7/25.   Prescribed:  Furosemide 20 mg take 1 tablet(s) (20 mg total) by mouth daily.  May take an additional tablet by mouth as needed for weight gain of 3 lbs overnight or 5 lbs in a week and/or worsening leg swelling  Potassium 20 mEq 1 tablet daily.   Labs: 09/16/2022 Creatinine 2.06, BUN 30, Potassium 5.0, Sodium 145, GFR 32 09/14/2022 Creatinine 2.14, BUN 29, Potassium 4.6, Sodium 143, GFR 30, BNP 2,262 05/04/2022 Creatinine 2.59, BUN 41, Potassium 4.8, Sodium 143, GFR 24 A complete set of results can be found in Results Review   Recommendations:   Advised to drink 50-60 oz fluid to stay hydrated and try to be consistent with fluid intake.   No changes and encouraged to call if experiencing any fluid symptoms.   Follow-up plan: ICM clinic phone appointment on 12/21/2022.   91 day device clinic remote transmission 02/08/2023.     EP/Cardiology Office Visits:  Recall 08/19/2023 with Dr Elberta Fortis.   12/16/2022 with Dr Jens Som.   Copy of ICM check sent to Dr. Elberta Fortis.  3 month ICM trend:  11/16/2022.    12-14 Month ICM trend:     Karie Soda, RN 11/18/2022 1:46 PM

## 2022-11-19 ENCOUNTER — Other Ambulatory Visit: Payer: Self-pay | Admitting: Cardiology

## 2022-11-24 ENCOUNTER — Telehealth: Payer: Self-pay

## 2022-11-24 NOTE — Telephone Encounter (Signed)
-----   Message from Dave Daniels [place] sent at 11/24/2022  4:51 PM EDT ----- Please let [name] know that his LDL is above goal at 82, ideally it would be below 70. He has previously worked with lipid clinic pharm D, would recommend following up with them. Thanks!

## 2022-11-24 NOTE — Telephone Encounter (Signed)
Patient aware of lab results. Verbalized understanding.  Scheduling he has already been seen with lipid clinic please call to schedule appointment

## 2022-11-26 NOTE — Progress Notes (Signed)
Remote ICD transmission.   

## 2022-12-07 NOTE — Progress Notes (Signed)
HPI: FU coronary artery disease status post coronary artery bypass and graft as well as ischemic cardiomyopathy. Patient's cardiac history dates back to 2010 when he had his first myocardial infarction. He had stents placed in [place]. He had a second myocardial infarction in March of 2012 and then had coronary artery bypass and graft. Cardiac catheterization was performed in August of 2012. Ejection fraction was 20%. The right coronary and LAD were occluded and there was critical circumflex disease. There was a patent saphenous vein graft to the right coronary artery. The LIMA to the LAD was patent. The saphenous vein graft to the obtuse marginal was also patent. Patient had biventricular ICD placed in December of 2012. Has had previous DCCV.  Follow-up echocardiogram June 2023 showed ejection fraction 20 to 25%, grade 3 diastolic dysfunction, apical thrombus, moderate right ventricular enlargement, mild RV dysfunction, severe biatrial enlargement, mild mitral regurgitation, mild to moderate tricuspid regurgitation, mild aortic insufficiency.  Since last seen, the patient has dyspnea with more extreme activities but not with routine activities. It is relieved with rest. It is not associated with chest pain. There is no orthopnea, PND or pedal edema. There is no syncope or palpitations. There is no exertional chest pain.   Current Outpatient Medications  Medication Sig Dispense Refill   acetaminophen (TYLENOL) 500 MG tablet Take 1,000 mg by mouth as needed for moderate pain or headache.      allopurinol (ZYLOPRIM) 300 MG tablet Take 300 mg by mouth daily as needed (Gout).  11   apixaban (ELIQUIS) 2.5 MG TABS tablet Take 1 tablet (2.5 mg total) by mouth 2 (two) times daily. 180 tablet 1   carvedilol (COREG) 12.5 MG tablet TAKE 1 TABLET BY MOUTH TWICE DAILY WITH A MEAL 180 tablet 0   cholecalciferol (VITAMIN D3) 25 MCG (1000 UNIT) tablet Take 1,000 Units by mouth daily.     colchicine 0.6 MG tablet  Take 0.3 mg by mouth daily as needed (Gout).     Evolocumab (REPATHA SURECLICK) 140 MG/ML SOAJ Inject 140 mg into the skin every 14 (fourteen) days. 6 mL 3   ezetimibe (ZETIA) 10 MG tablet Take 1 tablet (10 mg total) by mouth daily. 90 tablet 3   finasteride (PROSCAR) 5 MG tablet Take 5 mg by mouth daily.     furosemide (LASIX) 20 MG tablet Take 1 tablet (20 mg) by mouth daily. May take an additional tablet by mouth as needed for weight gain of 3 lbs overnight, 5 lbs in a week and/or worsen leg swelling 45 tablet 2   hydrALAZINE (APRESOLINE) 10 MG tablet TAKE 1 TABLET BY MOUTH THREE TIMES DAILY 90 tablet 0   isosorbide mononitrate (IMDUR) 30 MG 24 hr tablet Take 1 tablet by mouth once daily 90 tablet 3   tamsulosin (FLOMAX) 0.4 MG CAPS capsule Take 0.4 mg by mouth daily.     potassium chloride SA (K-DUR,KLOR-CON) 20 MEQ tablet Take 1 tablet (20 mEq total) by mouth daily. 30 tablet 11   No current facility-administered medications for this visit.     Past Medical History:  Diagnosis Date   CAD (coronary artery disease)    MI in [place] with stenting in 2010, then MI with CABG in [place] 06/2010.  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   Complete heart block (HCC)    Gout    Hyperlipidemia    Hypertension    Ischemic cardiomyopathy    EF 15% by echo 11/2010 and still 20-25% by follow-up echo 02/2011,  s/p St. Jude Bi-V ICD implantation 04/01/11   LBBB (left bundle branch block)    Renal insufficiency    Cr 1.6 on 03/25/11    Past Surgical History:  Procedure Laterality Date   BI-VENTRICULAR IMPLANTABLE CARDIOVERTER DEFIBRILLATOR N/A 04/01/2011   Procedure: BI-VENTRICULAR IMPLANTABLE CARDIOVERTER DEFIBRILLATOR  (CRT-D);  Surgeon: Marinus Maw, MD;  Location: [facility] CATH LAB;  Service: Cardiovascular;  Laterality: N/A;   BIV ICD GENERATOR CHANGEOUT N/A 05/08/2022   Procedure: BIV ICD GENERATOR CHANGEOUT;  Surgeon: Regan Lemming, MD;  Location: [facility] INVASIVE CV LAB;  Service: Cardiovascular;   Laterality: N/A;   CARDIAC DEFIBRILLATOR PLACEMENT  12/12   BiV ICD (SJM) implanted by Dr Johney Frame   CARDIOVERSION N/A 04/22/2018   Procedure: CARDIOVERSION;  Surgeon: Quintella Reichert, MD;  Location: [facility] ENDOSCOPY;  Service: Cardiovascular;  Laterality: N/A;   CARDIOVERSION N/A 06/13/2019   Procedure: CARDIOVERSION;  Surgeon: Quintella Reichert, MD;  Location: [facility] ENDOSCOPY;  Service: Cardiovascular;  Laterality: N/A;   Carpel tunnel surgery     CORONARY ARTERY BYPASS GRAFT  3/12   in [place]   EP IMPLANTABLE DEVICE N/A 08/19/2015   Procedure: BIV ICD Generator Changeout;  Surgeon: Hillis Range, MD;  Location: [facility] INVASIVE CV LAB;  Service: Cardiovascular;  Laterality: N/A;   INGUINAL HERNIA REPAIR     TONSILLECTOMY     UMBILICAL HERNIA REPAIR      Social History   Socioeconomic History   Marital status: Married    Spouse name: Not on file   Number of children: 4   Years of education: Not on file   Highest education level: Not on file  Occupational History    Comment: Retired  Tobacco Use   Smoking status: Never   Smokeless tobacco: Never  Vaping Use   Vaping status: Never Used  Substance and Sexual Activity   Alcohol use: Yes    Alcohol/week: 2.0 standard drinks of alcohol    Types: 2 Glasses of wine per week    Comment: occasional   Drug use: No   Sexual activity: Yes  Other Topics Concern   Not on file  Social History Narrative   Lives in [place], recently moved from [place].  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   Social Determinants of Corporate investment banker Strain: Not on file  Food Insecurity: Not on file  Transportation Needs: Not on file  Physical Activity: Not on file  Stress: Not on file  Social Connections: Not on file  Intimate Partner Violence: Not on file    Family History  Problem Relation Age of Onset   Pancreatic cancer Mother     ROS: no fevers or chills, productive cough, hemoptysis, dysphasia, odynophagia, melena, hematochezia, dysuria, hematuria, rash,  seizure activity, orthopnea, PND, pedal edema, claudication. Remaining systems are negative.  Physical Exam: Well-developed well-nourished in no acute distress.  Skin is warm and dry.  HEENT is normal.  Neck is supple.  Chest is clear to auscultation with normal expansion.  Cardiovascular exam is regular rate and rhythm.  Abdominal exam nontender or distended. No masses palpated. Extremities show no edema. neuro grossly intact  A/P  1 chronic systolic congestive heart failure-patient is euvolemic.  He is not on ARNI or spironolactone due to renal insufficiency.  Continue hydralazine/nitrates.  Add Jardiance 10 mg daily.  Check potassium and renal function in 1 week.  2 Ischemic cardiomyopathy-continue hydralazine/nitrates and carvedilol.  Entresto previously discontinued by nephrology.  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  4 hyperlipidemia-patient is  intolerant to statins.  Continue bempedoic acid and Zetia.  5 prior apical thrombus-patient will continue on apixaban.  6 paroxysmal atrial fibrillation-continue carvedilol and apixaban.  He is in sinus rhythm.  7 coronary artery disease-he denies chest pain.  Continue to follow.  He is not on aspirin given need for apixaban and is intolerant to statins.  8 hypertension-blood pressure controlled.  Continue present medical regimen.  Olga Millers, MD

## 2022-12-09 ENCOUNTER — Ambulatory Visit: Payer: Medicare HMO | Attending: Internal Medicine | Admitting: Pharmacist

## 2022-12-09 DIAGNOSIS — E7800 Pure hypercholesterolemia, unspecified: Secondary | ICD-10-CM | POA: Diagnosis not present

## 2022-12-09 MED ORDER — EZETIMIBE 10 MG PO TABS: 10.0000 mg | ORAL_TABLET | Freq: Every day | ORAL | 3 refills | Status: DC

## 2022-12-09 MED ORDER — REPATHA SURECLICK 140 MG/ML ~~LOC~~ SOAJ: 140.0000 mg | SUBCUTANEOUS | 3 refills | Status: DC

## 2022-12-09 NOTE — Progress Notes (Signed)
Patient ID: Dave Daniels                 DOB: 10-26-1940                    MRN: [id number]     HPI: Dave Daniels is a 82 y.o. male patient of Dr Jens Som referred to lipid clinic by Reather Littler, NP. PMH is significant for CAD s/p CABG in 2012, HTN, HLD, HFrEF due to ischemic cardiomyopathy, biventricular ICD placement in 12/12, LBBB, afib, gout, and CKD stage 3.  Last saw Katlyn on 09/21/22. Annual fasting lipid panel checked showed LDL above goal and he was referred to pharmacy for lipid management. Has seen PharmD for lipid management in March 2022 when he was started on Nexlizet. Has a hx of statin intolerance.  Today pt presents to clinic with his wife for cholesterol management. Adherent to Nexlizet 1 tab daily. Reports 3 gout flares within the past 6 months. Takes allopurinol and colchicine prn for flares. Reports taking allopurinol daily many years ago, but reports it was changed to prn due to blood count abnormalities. Using [employer] for [employer].   Current Medications: Nexlizet Intolerances: atorvastatin, pravastatin both caused rash/welts  Risk Factors: CAD s/p CABG, HTN, HLD, CKD LDL goal: <55 mg/dl  Diet: mostly eats at home, avoids salt   Exercise: not much  Family History: mother died at 51 pancreatic cancer; father died close to 93 MVA, head injury; 2 siblings deceased (2 brothers, older, one from cancer, other pancreatic cancer), 2 sisters recovered from colon cance   Social History: no tobacco, current alcohol use (~2 drinks/week)  Labs: 11/23/22: TC 135, TG 91, HDL 36, LDL 82 (Nexlizet) 1/47/82: TC 145, TG 97, HDL 42, LDL 85 (Nexlizet) 12/16/60: TC 160, TG 86, HDL 46, LDL 98 (Nexlizet) 04/15/06: TC 150, TG 63, HDL 47, LDL 90 (Nexlizet) 09/15/76: TC 236, TG 99, HDL 51, LDL 167 (no LLT) Past Medical History:  Diagnosis Date   CAD (coronary artery disease)    MI in [place] with stenting in 2010, then MI with CABG in [facility] 06/2010.  Small subendocardial MI  11/2010.   Complete heart block (HCC)    Gout    Hyperlipidemia    Hypertension    Ischemic cardiomyopathy    EF 15% by echo 11/2010 and still 20-25% by follow-up echo 02/2011, s/p St. Jude Bi-V ICD implantation 04/01/11   LBBB (left bundle branch block)    Renal insufficiency    Cr 1.6 on 03/25/11    Current Outpatient Medications on File Prior to Visit  Medication Sig Dispense Refill   acetaminophen (TYLENOL) 500 MG tablet Take 1,000 mg by mouth as needed for moderate pain or headache.      allopurinol (ZYLOPRIM) 300 MG tablet Take 300 mg by mouth daily as needed (Gout).  11   apixaban (ELIQUIS) 2.5 MG TABS tablet Take 1 tablet (2.5 mg total) by mouth 2 (two) times daily. 180 tablet 1   apixaban (ELIQUIS) 2.5 MG TABS tablet Take 1 tablet (2.5 mg total) by mouth 2 (two) times daily. 28 tablet 0   Bempedoic Acid-Ezetimibe (NEXLIZET) 180-10 MG TABS Take 1 tablet by mouth once daily 30 tablet 11   carvedilol (COREG) 12.5 MG tablet TAKE 1 TABLET BY MOUTH TWICE DAILY WITH A MEAL 180 tablet 0   cholecalciferol (VITAMIN D3) 25 MCG (1000 UNIT) tablet Take 1,000 Units by mouth daily.     colchicine 0.6 MG tablet  Take 0.3 mg by mouth daily as needed (Gout).     finasteride (PROSCAR) 5 MG tablet Take 5 mg by mouth daily.     furosemide (LASIX) 20 MG tablet Take 1 tablet (20 mg) by mouth daily. May take an additional tablet by mouth as needed for weight gain of 3 lbs overnight, 5 lbs in a week and/or worsen leg swelling 45 tablet 2   hydrALAZINE (APRESOLINE) 10 MG tablet TAKE 1 TABLET BY MOUTH THREE TIMES DAILY 90 tablet 0   isosorbide mononitrate (IMDUR) 30 MG 24 hr tablet Take 1 tablet by mouth once daily 90 tablet 3   potassium chloride SA (K-DUR,KLOR-CON) 20 MEQ tablet Take 1 tablet (20 mEq total) by mouth daily. 30 tablet 11   tamsulosin (FLOMAX) 0.4 MG CAPS capsule Take 0.4 mg by mouth daily.     No current facility-administered medications on file prior to visit.    Allergies  Allergen  Reactions   Statins Hives    Muscle pain & severe hives   Nsaids Other (See Comments)    Renal insufficiency  Renal insuff   Tolmetin Other (See Comments)    Renal insufficiency   Atorvastatin Rash   Pravastatin Rash    rash    Assessment/Plan:  1. Hyperlipidemia - Baseline LDl 167, improved to 82 on Nexlizet, remains above goal < 55. Has had 3 gout flares in the last 6 months. Since bempedoic acid can increase uric acid, will change Nexlizet to ezetimibe. Avoiding statins due to rash on multiple statins. Discussed addition of Repatha 140mg  Q2W to bring LDL to goal which pt is in agreement with. Prior authorization approved through 04/13/23. Discussed mechanisms of action, dosing, side effects and potential decreases in LDL cholesterol. He was already set up with the [employer] for [employer] and can use this to make Repatha free as well. Provided information for him to show to pharmacy. Follow up lipid panel due in ~8 weeks.  Adam Phenix, PharmD PGY-1 Pharmacy Resident  Megan E. Supple, PharmD, BCACP, CPP [facility] [address], [place], [place] [address] Phone: [phone]; Fax: [phone] 12/09/2022 4:29 PM

## 2022-12-09 NOTE — Patient Instructions (Addendum)
Start taking Repatha- 1 injection every 2 weeks  Stop taking Nexlizet  Start taking ezetimibe 10 mg daily  Please come to [facility]. Office for fasting labs on October 16th.  Please show the information below for the [employer] to your pharmacy. This should make the Repatha free.  CARD # [id number]  BIN [id number]  PCN [id number]  GROUP [id number]

## 2022-12-16 ENCOUNTER — Ambulatory Visit: Payer: Medicare HMO | Attending: Cardiology | Admitting: Cardiology

## 2022-12-16 ENCOUNTER — Encounter: Payer: Self-pay | Admitting: Cardiology

## 2022-12-16 VITALS — BP 135/70 | HR 60 | Ht 67.0 in | Wt 142.0 lb

## 2022-12-16 DIAGNOSIS — I519 Heart disease, unspecified: Secondary | ICD-10-CM | POA: Diagnosis not present

## 2022-12-16 DIAGNOSIS — I5022 Chronic systolic (congestive) heart failure: Secondary | ICD-10-CM

## 2022-12-16 DIAGNOSIS — I2510 Atherosclerotic heart disease of native coronary artery without angina pectoris: Secondary | ICD-10-CM

## 2022-12-16 DIAGNOSIS — I2583 Coronary atherosclerosis due to lipid rich plaque: Secondary | ICD-10-CM

## 2022-12-16 DIAGNOSIS — E7800 Pure hypercholesterolemia, unspecified: Secondary | ICD-10-CM | POA: Diagnosis not present

## 2022-12-16 MED ORDER — EMPAGLIFLOZIN 10 MG PO TABS: 10.0000 mg | ORAL_TABLET | Freq: Every day | ORAL | 6 refills | Status: DC

## 2022-12-16 NOTE — Patient Instructions (Signed)
Medication Instructions:   START Jardiance one (1) tablet by mouth ( 10 mg) daily.   *If you need a refill on your cardiac medications before your next appointment, please call your pharmacy*   Lab Work:  Your physician recommends that you return for lab work in one week.  You can come in on the day of your appointment anytime between 8:00-4:00 [address] and not between 11:30 - 1.     If you have labs (blood work) drawn today and your tests are completely normal, you will receive your results only by: MyChart Message (if you have MyChart) OR A paper copy in the mail If you have any lab test that is abnormal or we need to change your treatment, we will call you to review the results.   Testing/Procedures:  None ordered.   Follow-Up: At [facility], you and your health needs are our priority.  As part of our continuing mission to provide you with exceptional heart care, we have created designated Provider Care Teams.  These Care Teams include your primary Cardiologist (physician) and Advanced Practice Providers (APPs -  Physician Assistants and Nurse Practitioners) who all work together to provide you with the care you need, when you need it.  We recommend signing up for the patient portal called "MyChart".  Sign up information is provided on this After Visit Summary.  MyChart is used to connect with patients for Virtual Visits (Telemedicine).  Patients are able to view lab/test results, encounter notes, upcoming appointments, etc.  Non-urgent messages can be sent to your provider as well.   To learn more about what you can do with MyChart, go to [email or web address].    Your next appointment:   6 month(s)  Provider:   Olga Millers, MD

## 2022-12-17 ENCOUNTER — Telehealth: Payer: Self-pay | Admitting: Cardiology

## 2022-12-17 ENCOUNTER — Encounter: Payer: Self-pay | Admitting: Pharmacist

## 2022-12-17 NOTE — Telephone Encounter (Signed)
Pt c/o medication issue:  1. Name of Medication:   empagliflozin (JARDIANCE) 10 MG TABS tablet    2. How are you currently taking this medication (dosage and times per day)? As written   3. Are you having a reaction (difficulty breathing--STAT)? No   4. What is your medication issue? Pt states with insurance this medication is $150 and he is unable to afford. Please advise other options.

## 2022-12-17 NOTE — Telephone Encounter (Signed)
Called and provided card information   ID [id number]   CARD STATUS Active   BIN [id number]   PCN [id number]   PC GROUP [id number]

## 2022-12-17 NOTE — Telephone Encounter (Signed)
The cardiomyopathy grant is open with the [facility] currently which would make his Jardiance free. He has a separate Orthoptist for his Repatha already. I called him to discuss, no answer, left detailed message. I have enrolled him in the grant and sent new grant info to him via MyChart message.

## 2022-12-17 NOTE — Telephone Encounter (Signed)
Patient states after his insurance jardiance would still cost $150 a month and he can not afford that. He would like another option.

## 2022-12-20 ENCOUNTER — Other Ambulatory Visit: Payer: Self-pay | Admitting: Cardiology

## 2022-12-21 ENCOUNTER — Ambulatory Visit: Payer: Medicare HMO | Attending: Cardiology

## 2022-12-21 DIAGNOSIS — Z95810 Presence of automatic (implantable) cardiac defibrillator: Secondary | ICD-10-CM | POA: Diagnosis not present

## 2022-12-21 DIAGNOSIS — I5022 Chronic systolic (congestive) heart failure: Secondary | ICD-10-CM

## 2022-12-21 NOTE — Progress Notes (Signed)
EPIC Encounter for ICM Monitoring  Patient Name: Dave Daniels is a 82 y.o. male Date: 12/21/2022 Primary Care Physican: Cheron Schaumann., MD Primary Cardiologist: Jens Som Electrophysiologist: Elberta Fortis Nephrologist: [facility] Bi-V Pacing: >99%    08/13/2021 Weight: 152-153 lbs 02/03/2022 Weight: 152 lbs 03/04/2022 Weight: 150-152 lbs 07/03/2022 Weight: 150 lbs 10/16/2022 Weight: 142 -143 lbs lbs 12/21/2022 Weight: 142 lbs        AT/AF Burden 0% (taking Eliquis)                                    Spoke with patient and heart failure questions reviewed.  Transmission results reviewed.  Pt asymptomatic for fluid accumulation.  Reports feeling well at this time and voices no complaints.  He reports no changes in diet and fluid intake.    Corvue thoracic impedance suggesting possible fluid accumulation starting 9/1 but trending back toward baseline.   Prescribed:  Furosemide 20 mg take 1 tablet(s) (20 mg total) by mouth daily.  May take an additional tablet by mouth as needed for weight gain of 3 lbs overnight or 5 lbs in a week and/or worsening leg swelling  Potassium 20 mEq 1 tablet daily.   Labs: 09/16/2022 Creatinine 2.06, BUN 30, Potassium 5.0, Sodium 145, GFR 32 09/14/2022 Creatinine 2.14, BUN 29, Potassium 4.6, Sodium 143, GFR 30, BNP 2,262 05/04/2022 Creatinine 2.59, BUN 41, Potassium 4.8, Sodium 143, GFR 24 A complete set of results can be found in Results Review   Recommendations:   Advised to take 1 additional Furosemide tablet tomorrow morning (as prescribed) x 1 day then return to prescribed dosage of 1 tablet a day.   Follow-up plan: ICM clinic phone appointment on 12/28/2022 to recheck fluid levels.   91 day device clinic remote transmission 02/08/2023.     EP/Cardiology Office Visits:  Recall 08/19/2023 with Dr Elberta Fortis.   06/30/2023 with Dr Jens Som.   Copy of ICM check sent to Dr. Elberta Fortis and Dr Jens Som as Lorain Childes and review.  3 month ICM trend:  12/21/2022.    12-14 Month ICM trend:     Karie Soda, RN 12/21/2022 2:53 PM

## 2022-12-26 ENCOUNTER — Telehealth: Payer: Self-pay | Admitting: Internal Medicine

## 2022-12-26 DIAGNOSIS — E875 Hyperkalemia: Secondary | ICD-10-CM

## 2022-12-26 DIAGNOSIS — N183 Chronic kidney disease, stage 3 unspecified: Secondary | ICD-10-CM

## 2022-12-26 LAB — BASIC METABOLIC PANEL
BUN/Creatinine Ratio: 13 (ref 10–24)
BUN: 37 mg/dL — ABNORMAL HIGH (ref 8–27)
CO2: 20 mmol/L (ref 20–29)
Calcium: 10.5 mg/dL — ABNORMAL HIGH (ref 8.6–10.2)
Chloride: 103 mmol/L (ref 96–106)
Creatinine, Ser: 2.85 mg/dL — ABNORMAL HIGH (ref 0.76–1.27)
Glucose: 96 mg/dL (ref 70–99)
Potassium: 6.2 mmol/L (ref 3.5–5.2)
Sodium: 147 mmol/L — ABNORMAL HIGH (ref 134–144)
eGFR: 21 mL/min/{1.73_m2} — ABNORMAL LOW (ref >59)

## 2022-12-26 NOTE — Telephone Encounter (Signed)
Received a call from [facility] that patient had labs collected on 12/25/22 at 1PM and potassium was 6.2 (believe these labs were ordered on 12/16/22 after clinic visit)

## 2022-12-27 ENCOUNTER — Emergency Department (HOSPITAL_COMMUNITY)
Admission: EM | Admit: 2022-12-27 | Discharge: 2022-12-27 | Discharge status: Absent without leave | Payer: Medicare HMO | Source: Home / Self Care

## 2022-12-27 ENCOUNTER — Other Ambulatory Visit (HOSPITAL_COMMUNITY)
Admission: RE | Admit: 2022-12-27 | Discharge: 2022-12-27 | Disposition: A | Discharge status: Home / Self Care | Payer: Medicare HMO | Attending: Cardiology | Admitting: Cardiology

## 2022-12-27 DIAGNOSIS — I2583 Coronary atherosclerosis due to lipid rich plaque: Secondary | ICD-10-CM | POA: Diagnosis not present

## 2022-12-27 DIAGNOSIS — I2510 Atherosclerotic heart disease of native coronary artery without angina pectoris: Secondary | ICD-10-CM | POA: Insufficient documentation

## 2022-12-27 DIAGNOSIS — E7800 Pure hypercholesterolemia, unspecified: Secondary | ICD-10-CM | POA: Diagnosis present

## 2022-12-27 DIAGNOSIS — I5022 Chronic systolic (congestive) heart failure: Secondary | ICD-10-CM | POA: Insufficient documentation

## 2022-12-27 DIAGNOSIS — I519 Heart disease, unspecified: Secondary | ICD-10-CM | POA: Insufficient documentation

## 2022-12-27 LAB — BASIC METABOLIC PANEL
Anion gap: 9 (ref 5–15)
BUN: 40 mg/dL — ABNORMAL HIGH (ref 8–23)
CO2: 25 mmol/L (ref 22–32)
Calcium: 9.4 mg/dL (ref 8.9–10.3)
Chloride: 105 mmol/L (ref 98–111)
Creatinine, Ser: 2.83 mg/dL — ABNORMAL HIGH (ref 0.61–1.24)
GFR, Estimated: 22 mL/min — ABNORMAL LOW (ref >60)
Glucose, Bld: 93 mg/dL (ref 70–99)
Potassium: 4.2 mmol/L (ref 3.5–5.1)
Sodium: 139 mmol/L (ref 135–145)

## 2022-12-27 NOTE — Telephone Encounter (Signed)
Called lab at [facility]. Spoke with ED staff and [employer]. Pt can come to ED or MAU to check in and then get sent to lab for BMET. I have given written order to personnel in lab at [facility]. Order is to call me with results. Tereso Newcomer, PA-C    12/27/2022 10:12 AM

## 2022-12-28 ENCOUNTER — Ambulatory Visit: Payer: Medicare HMO | Attending: Cardiology

## 2022-12-28 DIAGNOSIS — I5022 Chronic systolic (congestive) heart failure: Secondary | ICD-10-CM | POA: Diagnosis not present

## 2022-12-28 DIAGNOSIS — Z95810 Presence of automatic (implantable) cardiac defibrillator: Secondary | ICD-10-CM | POA: Diagnosis not present

## 2022-12-28 NOTE — Progress Notes (Signed)
EPIC Encounter for ICM Monitoring  Patient Name: Dave Daniels is a 82 y.o. male Date: 12/28/2022 Primary Care Physican: Cheron Schaumann., MD Primary Cardiologist: Jens Som Electrophysiologist: Elberta Fortis Nephrologist: [facility] Bi-V Pacing: >99%    07/03/2022 Weight: 150 lbs 10/16/2022 Weight: 142 -143 lbs lbs 12/28/2022 Weight: 142 lbs       AT/AF Burden 1.1% (taking Eliquis)                                    Spoke with patient and heart failure questions reviewed.  Transmission results reviewed.  He is asymptomatic for fluid accumulation and feeling well.      Corvue thoracic impedance suggesting possible fluid accumulation starting 9/1 and returned close to baseline.  Prescribed:  Furosemide 20 mg take 1 tablet(s) (20 mg total) by mouth daily.  May take an additional tablet by mouth as needed for weight gain of 3 lbs overnight or 5 lbs in a week and/or worsening leg swelling  Potassium 20 mEq 1 tablet daily.   Labs: 12/27/2022 Creatinine 2.83, BUN 40, Potassium 4.2, Sodium 139  12/25/2022 Creatinine 2.85, BUN 37, Potassium 6.2, Sodium 147, GFR 21 (potassium level has been addressed per 9/14 note) 09/16/2022 Creatinine 2.06, BUN 30, Potassium 5.0, Sodium 145, GFR 32 09/14/2022 Creatinine 2.14, BUN 29, Potassium 4.6, Sodium 143, GFR 30, BNP 2,262 05/04/2022 Creatinine 2.59, BUN 41, Potassium 4.8, Sodium 143, GFR 24 A complete set of results can be found in Results Review   Recommendations:  Recommendation to limit salt intake to 2000 mg daily and fluid intake to 64 oz daily.  Encouraged to call if experiencing any fluid symptoms.    Follow-up plan: ICM clinic phone appointment on 02/01/2023.   91 day device clinic remote transmission 02/08/2023.     EP/Cardiology Office Visits:  Recall 08/19/2023 with Dr Elberta Fortis.   06/30/2023 with Dr Jens Som.   Copy of ICM check sent to Dr. Elberta Fortis.  3 month ICM trend: 12/28/2022.    12-14 Month ICM trend:      Karie Soda, RN 12/28/2022 3:54 PM

## 2022-12-30 ENCOUNTER — Encounter: Payer: Self-pay | Admitting: Cardiology

## 2022-12-30 DIAGNOSIS — I5022 Chronic systolic (congestive) heart failure: Secondary | ICD-10-CM

## 2022-12-31 ENCOUNTER — Other Ambulatory Visit: Payer: Self-pay

## 2022-12-31 MED ORDER — EMPAGLIFLOZIN 10 MG PO TABS: 10.0000 mg | ORAL_TABLET | Freq: Every day | ORAL | 3 refills | Status: DC

## 2023-01-05 ENCOUNTER — Telehealth: Payer: Self-pay

## 2023-01-05 NOTE — Telephone Encounter (Signed)
Patient returned call. Patient reports no symptoms and compliant with OAC daily. Routing to Dr. Elberta Fortis to advise.

## 2023-01-05 NOTE — Telephone Encounter (Signed)
Following alert received from [employer] received for  AF/AFL in progress from 9/14, controlled rates, burden 7%, Eliquis per PA report.  Called patient to assess. Patient is unavailable per wife. Will call back when he is available.

## 2023-01-06 ENCOUNTER — Telehealth: Payer: Self-pay

## 2023-01-06 NOTE — Telephone Encounter (Signed)
Patients family advises patient is not home but she will have him return call when he gets back. Direct number left.

## 2023-01-06 NOTE — Telephone Encounter (Signed)
Spoke with pt, he reports he only has 2 pills left. Aware will place samples at the front desk for pick up and the assistance application will be enclosed.

## 2023-01-06 NOTE — Telephone Encounter (Signed)
Patient is needing a refill on his Eliquis but the pharmacy is charging him a lot for it and is asking for a call back asap. CB # [phone]

## 2023-01-08 NOTE — Telephone Encounter (Signed)
Spoke w/ patient. Agreeable after an extended explanation as to the benefits of going to the AF clinic.

## 2023-01-13 LAB — BASIC METABOLIC PANEL
BUN/Creatinine Ratio: 15 (ref 10–24)
BUN: 35 mg/dL — ABNORMAL HIGH (ref 8–27)
CO2: 24 mmol/L (ref 20–29)
Calcium: 9.5 mg/dL (ref 8.6–10.2)
Chloride: 106 mmol/L (ref 96–106)
Creatinine, Ser: 2.32 mg/dL — ABNORMAL HIGH (ref 0.76–1.27)
Glucose: 90 mg/dL (ref 70–99)
Potassium: 3.9 mmol/L (ref 3.5–5.2)
Sodium: 146 mmol/L — ABNORMAL HIGH (ref 134–144)
eGFR: 27 mL/min/{1.73_m2} — ABNORMAL LOW (ref >59)

## 2023-01-15 ENCOUNTER — Encounter: Payer: Self-pay | Admitting: *Deleted

## 2023-01-16 ENCOUNTER — Other Ambulatory Visit: Payer: Self-pay | Admitting: Cardiology

## 2023-01-18 ENCOUNTER — Ambulatory Visit (HOSPITAL_COMMUNITY)
Admission: RE | Admit: 2023-01-18 | Discharge: 2023-01-18 | Disposition: A | Discharge status: Home / Self Care | Payer: Medicare HMO | Source: Ambulatory Visit | Attending: Internal Medicine | Admitting: Internal Medicine

## 2023-01-18 VITALS — BP 130/84 | HR 70 | Ht 67.0 in | Wt 146.2 lb

## 2023-01-18 DIAGNOSIS — Z95810 Presence of automatic (implantable) cardiac defibrillator: Secondary | ICD-10-CM | POA: Diagnosis not present

## 2023-01-18 DIAGNOSIS — I4891 Unspecified atrial fibrillation: Secondary | ICD-10-CM

## 2023-01-18 DIAGNOSIS — Z7901 Long term (current) use of anticoagulants: Secondary | ICD-10-CM | POA: Diagnosis not present

## 2023-01-18 DIAGNOSIS — I129 Hypertensive chronic kidney disease with stage 1 through stage 4 chronic kidney disease, or unspecified chronic kidney disease: Secondary | ICD-10-CM | POA: Diagnosis not present

## 2023-01-18 DIAGNOSIS — Z79899 Other long term (current) drug therapy: Secondary | ICD-10-CM | POA: Diagnosis not present

## 2023-01-18 DIAGNOSIS — R000 Tachycardia, unspecified: Secondary | ICD-10-CM | POA: Insufficient documentation

## 2023-01-18 DIAGNOSIS — I4819 Other persistent atrial fibrillation: Secondary | ICD-10-CM | POA: Insufficient documentation

## 2023-01-18 DIAGNOSIS — I442 Atrioventricular block, complete: Secondary | ICD-10-CM | POA: Insufficient documentation

## 2023-01-18 DIAGNOSIS — I2510 Atherosclerotic heart disease of native coronary artery without angina pectoris: Secondary | ICD-10-CM | POA: Insufficient documentation

## 2023-01-18 DIAGNOSIS — N189 Chronic kidney disease, unspecified: Secondary | ICD-10-CM | POA: Insufficient documentation

## 2023-01-18 DIAGNOSIS — I4892 Unspecified atrial flutter: Secondary | ICD-10-CM | POA: Insufficient documentation

## 2023-01-18 NOTE — Patient Instructions (Signed)
Options for treatment include:  Cardioversion procedure ("shock") to restore normal rhythm. If at follow up appointment, you are back in an abnormal rhythm then would suggest either of the two choices below.   Medication - amiodarone is an anti-arrhythmic medication that can help to maintain normal rhythm  Ablation - procedure used to lower amount of abnormal heart rhythm

## 2023-01-18 NOTE — Progress Notes (Addendum)
Primary Care Physician: Cheron Schaumann., MD Referring Physician:Dr. [name] is a 82 y.o. male with a h/o CAD, CKD, CHB with ICD for h/o ischemic cardiomyopathy, HTN, that  is in the clinic for device clinic finding persistent afib since 05/07/19. He has not missed any of his eliquis 5 mg bid for a CHA2DS2VASc score of 5.   He is unaware of any irregular heart rhythm, denies fatigue/ shortness of breath.  The only trigger is that he had a spell of gout mid January.  F/u in afib clinic one week after cardioversion. He remains in a paced rhythm. He feels no different in afib vs SR.  F/u 01/18/23 Afib clinic, he is currently in atrial flutter. Device clinic alert on 9/24 for Afib/flutter in progress from 9/14; burden 7%. He does not have cardiac awareness of his Afib. He overall feels well. He may notice occasional SOB if he does something very strenuous but otherwise can do daily activity without issue. No bleeding issues on Eliquis 2.5 mg BID.   Today, he denies symptoms of palpitations, chest pain, shortness of breath, orthopnea, PND, lower extremity edema, dizziness, presyncope, syncope, or neurologic sequela. The patient is tolerating medications without difficulties and is otherwise without complaint today.   Past Medical History:  Diagnosis Date   CAD (coronary artery disease)    MI in [place] with stenting in 2010, then MI with CABG in [place] 06/2010.  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   Complete heart block (HCC)    Gout    Hyperlipidemia    Hypertension    Ischemic cardiomyopathy    EF 15% by echo 11/2010 and still 20-25% by follow-up echo 02/2011, s/p [employer] Bi-V ICD implantation 04/01/11   LBBB (left bundle branch block)    Renal insufficiency    Cr 1.6 on 03/25/11   Past Surgical History:  Procedure Laterality Date   BI-VENTRICULAR IMPLANTABLE CARDIOVERTER DEFIBRILLATOR N/A 04/01/2011   Procedure: BI-VENTRICULAR IMPLANTABLE CARDIOVERTER DEFIBRILLATOR   (CRT-D);  Surgeon: Marinus Maw, MD;  Location: [facility] CATH LAB;  Service: Cardiovascular;  Laterality: N/A;   BIV ICD GENERATOR CHANGEOUT N/A 05/08/2022   Procedure: BIV ICD GENERATOR CHANGEOUT;  Surgeon: Regan Lemming, MD;  Location: [facility] INVASIVE CV LAB;  Service: Cardiovascular;  Laterality: N/A;   CARDIAC DEFIBRILLATOR PLACEMENT  12/12   BiV ICD (SJM) implanted by Dr Johney Frame   CARDIOVERSION N/A 04/22/2018   Procedure: CARDIOVERSION;  Surgeon: Quintella Reichert, MD;  Location: [facility] ENDOSCOPY;  Service: Cardiovascular;  Laterality: N/A;   CARDIOVERSION N/A 06/13/2019   Procedure: CARDIOVERSION;  Surgeon: Quintella Reichert, MD;  Location: [facility] ENDOSCOPY;  Service: Cardiovascular;  Laterality: N/A;   Carpel tunnel surgery     CORONARY ARTERY BYPASS GRAFT  3/12   in [place]   EP IMPLANTABLE DEVICE N/A 08/19/2015   Procedure: BIV ICD Generator Changeout;  Surgeon: Hillis Range, MD;  Location: [facility] INVASIVE CV LAB;  Service: Cardiovascular;  Laterality: N/A;   INGUINAL HERNIA REPAIR     TONSILLECTOMY     UMBILICAL HERNIA REPAIR      Current Outpatient Medications  Medication Sig Dispense Refill   acetaminophen (TYLENOL) 500 MG tablet Take 1,000 mg by mouth as needed for moderate pain or headache.      allopurinol (ZYLOPRIM) 300 MG tablet Take 300 mg by mouth daily as needed (Gout).  11   apixaban (ELIQUIS) 2.5 MG TABS tablet Take 1 tablet (2.5 mg total) by mouth 2 (two) times  daily. 180 tablet 1   carvedilol (COREG) 12.5 MG tablet TAKE 1 TABLET BY MOUTH TWICE DAILY WITH A MEAL 180 tablet 0   cholecalciferol (VITAMIN D3) 25 MCG (1000 UNIT) tablet Take 1,000 Units by mouth daily.     colchicine 0.6 MG tablet Take 0.3 mg by mouth daily as needed (Gout).     Evolocumab (REPATHA SURECLICK) 140 MG/ML SOAJ Inject 140 mg into the skin every 14 (fourteen) days. 6 mL 3   ezetimibe (ZETIA) 10 MG tablet Take 1 tablet (10 mg total) by mouth daily. 90 tablet 3   finasteride (PROSCAR) 5 MG tablet Take 5 mg by mouth  daily.     furosemide (LASIX) 20 MG tablet Take 1 tablet (20 mg) by mouth daily. May take an additional tablet by mouth as needed for weight gain of 3 lbs overnight, 5 lbs in a week and/or worsen leg swelling 45 tablet 2   hydrALAZINE (APRESOLINE) 10 MG tablet TAKE 1 TABLET BY MOUTH THREE TIMES DAILY 90 tablet 0   isosorbide mononitrate (IMDUR) 30 MG 24 hr tablet Take 1 tablet by mouth once daily 90 tablet 3   mirabegron ER (MYRBETRIQ) 25 MG TB24 tablet Take 1 tablet by mouth daily.     oxybutynin (DITROPAN-XL) 10 MG 24 hr tablet Take 10 mg by mouth daily.     tamsulosin (FLOMAX) 0.4 MG CAPS capsule Take 0.4 mg by mouth daily.     empagliflozin (JARDIANCE) 10 MG TABS tablet Take 1 tablet (10 mg total) by mouth daily before breakfast. (Patient not taking: Reported on 01/18/2023) 100 tablet 3   potassium chloride SA (K-DUR,KLOR-CON) 20 MEQ tablet Take 1 tablet (20 mEq total) by mouth daily. (Patient not taking: Reported on 01/18/2023) 30 tablet 11   No current facility-administered medications for this encounter.    Allergies  Allergen Reactions   Statins Hives    Muscle pain & severe hives   Nsaids Other (See Comments)    Renal insufficiency  Renal insuff   Tolmetin Other (See Comments)    Renal insufficiency   Atorvastatin Rash   Pravastatin Rash    rash    ROS- All systems are reviewed and negative except as per the HPI above  Physical Exam: Vitals:   01/18/23 1432  BP: 130/84  Pulse: 70  Weight: 66.3 kg  Height: 5\' 7"  (1.702 m)    Wt Readings from Last 3 Encounters:  01/18/23 66.3 kg  12/16/22 64.4 kg  09/21/22 66.4 kg    Labs: Lab Results  Component Value Date   NA 146 (H) 01/12/2023   K 3.9 01/12/2023   CL 106 01/12/2023   CO2 24 01/12/2023   GLUCOSE 90 01/12/2023   BUN 35 (H) 01/12/2023   CREATININE 2.32 (H) 01/12/2023   CALCIUM 9.5 01/12/2023   Lab Results  Component Value Date   INR 1.0 03/25/2011   Lab Results  Component Value Date   CHOL 135  11/23/2022   HDL 36 (L) 11/23/2022   LDLCALC 82 11/23/2022   TRIG 91 11/23/2022   GEN- The patient is well appearing, alert and oriented x 3 today.   Neck - no JVD or carotid bruit noted Lungs- Clear to ausculation bilaterally, normal work of breathing Heart- Regular rate and rhythm, no murmurs, rubs or gallops, PMI not laterally displaced Extremities- no clubbing, cyanosis, or edema Skin - no rash or ecchymosis noted   EKG- Vent. rate 70 BPM PR interval * ms QRS duration 178 ms QT/QTcB 482/520  ms P-R-T axes 99 -87 91 Biventricular pacemaker detected Atrial flutter Abnormal ECG When compared with ECG of 20-Jun-2019 13:32, PREVIOUS ECG IS PRESENT  ECHO 09/12/21: 1. Apical LV thrombus is present. Left ventricular ejection fraction, by  estimation, is 20 to 25%. The left ventricle has severely decreased  function. The left ventricle demonstrates regional wall motion  abnormalities (see scoring diagram/findings for  description). Left ventricular diastolic parameters are consistent with  Grade III diastolic dysfunction (restrictive). Elevated left atrial  pressure.   2. Right ventricular systolic function is mildly reduced. The right  ventricular size is moderately enlarged. There is moderately elevated  pulmonary artery systolic pressure.   3. Left atrial size was severely dilated.   4. Right atrial size was severely dilated.   5. The mitral valve is normal in structure. Mild mitral valve  regurgitation. No evidence of mitral stenosis.   6. Tricuspid valve regurgitation is mild to moderate.   7. The aortic valve is tricuspid. Aortic valve regurgitation is mild.  Aortic valve sclerosis/calcification is present, without any evidence of  aortic stenosis.   8. The inferior vena cava is normal in size with <50% respiratory  variability, suggesting right atrial pressure of 8 mmHg.    Assessment and Plan: 1. Persistent  afib since 05/07/19 He was asymptomatic Continue carvedilol  12.5 mg bid Cardioversion 06/12/19 was successful   He is in atrial flutter.  His CrCl is prohibitive of Tikosyn. He would be a candidate for amiodarone as AAD therapy. Discussion of potential adverse effects and benefits of medication helping him maintain normal rhythm. We also discussed cardioversion with or without addition of medication and how may not remain in normal rhythm afterwards. Discussion with EP whether he could be a candidate for ablation given his renal disease. He would like to discuss further with wife and family. He will contact clinic when he has made a decision on how to proceed.    2.CHA2DS2VASc score of at least 5 Doing well on eliquis 2.5 mg bid States no missed doses     Patient will call clinic when he has a made a decision.   Addendum 02/03/23: Patient called office and wished to proceed with scheduling cardioversion. He will arrive for LAB only on 10/31 prior to scheduled DCCV later on 10/31.  Informed Consent   Shared Decision Making/Informed Consent The risks (stroke, cardiac arrhythmias rarely resulting in the need for a temporary or permanent pacemaker, skin irritation or burns and complications associated with conscious sedation including aspiration, arrhythmia, respiratory failure and death), benefits (restoration of normal sinus rhythm) and alternatives of a direct current cardioversion were explained in detail to Dave Daniels and he agrees to proceed.         Justin Mend, PA-C Afib Clinic [facility] [address] [place], [place] [address] [phone]

## 2023-01-27 ENCOUNTER — Other Ambulatory Visit: Payer: Medicare HMO

## 2023-01-27 ENCOUNTER — Other Ambulatory Visit: Payer: Self-pay | Admitting: *Deleted

## 2023-01-27 DIAGNOSIS — E7800 Pure hypercholesterolemia, unspecified: Secondary | ICD-10-CM

## 2023-01-28 LAB — HEPATIC FUNCTION PANEL
ALT: 19 [IU]/L (ref 0–44)
AST: 16 [IU]/L (ref 0–40)
Albumin: 4.1 g/dL (ref 3.7–4.7)
Alkaline Phosphatase: 110 [IU]/L (ref 44–121)
Bilirubin Total: 1 mg/dL (ref 0.0–1.2)
Bilirubin, Direct: 0.37 mg/dL (ref 0.00–0.40)
Total Protein: 6.6 g/dL (ref 6.0–8.5)

## 2023-01-28 LAB — LIPID PANEL
Chol/HDL Ratio: 1.9 {ratio} (ref 0.0–5.0)
Cholesterol, Total: 87 mg/dL — ABNORMAL LOW (ref 100–199)
HDL: 47 mg/dL (ref >39)
LDL Chol Calc (NIH): 24 mg/dL (ref 0–99)
Triglycerides: 76 mg/dL (ref 0–149)
VLDL Cholesterol Cal: 16 mg/dL (ref 5–40)

## 2023-02-01 ENCOUNTER — Other Ambulatory Visit: Payer: Self-pay | Admitting: Cardiology

## 2023-02-01 ENCOUNTER — Encounter: Payer: Self-pay | Admitting: *Deleted

## 2023-02-01 ENCOUNTER — Ambulatory Visit: Payer: Medicare HMO | Attending: Cardiology

## 2023-02-01 DIAGNOSIS — I5022 Chronic systolic (congestive) heart failure: Secondary | ICD-10-CM

## 2023-02-01 DIAGNOSIS — Z95810 Presence of automatic (implantable) cardiac defibrillator: Secondary | ICD-10-CM | POA: Diagnosis not present

## 2023-02-02 NOTE — Progress Notes (Signed)
EPIC Encounter for ICM Monitoring  Patient Name: Dave Daniels is a 82 y.o. male Date: 02/02/2023 Primary Care Physican: Cheron Schaumann., MD Primary Cardiologist: Jens Som Electrophysiologist: Elberta Fortis Nephrologist: [facility] Bi-V Pacing: >99%    07/03/2022 Weight: 150 lbs 10/16/2022 Weight: 142 -143 lbs lbs 12/28/2022 Weight: 142 lbs       AT/AF Burden 22% (taking Eliquis)                                     Attempted call to patient and unable to reach.   Transmission reviewed.    Corvue thoracic impedance suggesting possible fluid accumulation starting 10/17.  Difficulty maintaining normal fluid levels since 12/2022.   Pt seen in Afib clinic 10/7 for increased Afib burden. Prescribed:  Furosemide 20 mg take 1 tablet(s) (20 mg total) by mouth daily.  May take an additional tablet by mouth as needed for weight gain of 3 lbs overnight or 5 lbs in a week and/or worsening leg swelling  Potassium 20 mEq 1 tablet daily.   Labs: 01/12/2023 Creatinine 2.32, BUN 35, Potassium 3.9, Sodium 146 12/27/2022 Creatinine 2.83, BUN 40, Potassium 4.2, Sodium 139  12/25/2022 Creatinine 2.85, BUN 37, Potassium 6.2, Sodium 147, GFR 21 (potassium level has been addressed per 9/14 note) 09/16/2022 Creatinine 2.06, BUN 30, Potassium 5.0, Sodium 145, GFR 32 09/14/2022 Creatinine 2.14, BUN 29, Potassium 4.6, Sodium 143, GFR 30, BNP 2,262 05/04/2022 Creatinine 2.59, BUN 41, Potassium 4.8, Sodium 143, GFR 24 A complete set of results can be found in Results Review   Recommendations:  Unable to reach.       Follow-up plan: ICM clinic phone appointment on 02/08/2023 to recheck fluid levels.   91 day device clinic remote transmission 02/08/2023.     EP/Cardiology Office Visits:  Recall 08/19/2023 with Dr Elberta Fortis.   06/30/2023 with Dr Jens Som.   Copy of ICM check sent to Dr. Elberta Fortis.  Will send to Dr Jens Som for review if patient is reached.    3 month ICM trend:  02/01/2023.    12-14 Month ICM trend:     Karie Soda, RN 02/02/2023 2:20 PM

## 2023-02-03 ENCOUNTER — Other Ambulatory Visit (HOSPITAL_COMMUNITY): Payer: Self-pay | Admitting: *Deleted

## 2023-02-03 DIAGNOSIS — I4891 Unspecified atrial fibrillation: Secondary | ICD-10-CM

## 2023-02-03 NOTE — Addendum Note (Signed)
Encounter addended by: Eustace Pen, PA-C on: 02/03/2023 1:37 PM  Actions taken: Clinical Note Signed

## 2023-02-08 ENCOUNTER — Ambulatory Visit: Payer: Medicare HMO | Attending: Cardiology

## 2023-02-08 ENCOUNTER — Ambulatory Visit (INDEPENDENT_AMBULATORY_CARE_PROVIDER_SITE_OTHER): Payer: Medicare HMO

## 2023-02-08 DIAGNOSIS — Z95810 Presence of automatic (implantable) cardiac defibrillator: Secondary | ICD-10-CM

## 2023-02-08 DIAGNOSIS — I5022 Chronic systolic (congestive) heart failure: Secondary | ICD-10-CM

## 2023-02-08 DIAGNOSIS — I255 Ischemic cardiomyopathy: Secondary | ICD-10-CM

## 2023-02-08 NOTE — Progress Notes (Signed)
EPIC Encounter for ICM Monitoring  Patient Name: Dave Daniels is a 82 y.o. male Date: 02/08/2023 Primary Care Physican: Cheron Schaumann., MD Primary Cardiologist: Jens Som Electrophysiologist: Elberta Fortis Nephrologist: [facility] Bi-V Pacing: >99%    07/03/2022 Weight: 150 lbs 10/16/2022 Weight: 142 -143 lbs lbs 12/28/2022 Weight: 142 lbs       AT/AF Burden 26% (taking Eliquis)                                     Transmission results reviewed.  Scheduled for Cardioversion 10/31.   Corvue thoracic impedance suggesting fluid levels returned to baseline.  Difficulty maintaining normal fluid levels since 12/2022.   Pt seen in Afib clinic 10/7 for increased Afib burden.  Prescribed:  Furosemide 20 mg take 1 tablet(s) (20 mg total) by mouth daily.  May take an additional tablet by mouth as needed for weight gain of 3 lbs overnight or 5 lbs in a week and/or worsening leg swelling  Potassium 20 mEq 1 tablet daily.   Labs: 01/12/2023 Creatinine 2.32, BUN 35, Potassium 3.9, Sodium 146 12/27/2022 Creatinine 2.83, BUN 40, Potassium 4.2, Sodium 139  12/25/2022 Creatinine 2.85, BUN 37, Potassium 6.2, Sodium 147, GFR 21 (potassium level has been addressed per 9/14 note) 09/16/2022 Creatinine 2.06, BUN 30, Potassium 5.0, Sodium 145, GFR 32 09/14/2022 Creatinine 2.14, BUN 29, Potassium 4.6, Sodium 143, GFR 30, BNP 2,262 05/04/2022 Creatinine 2.59, BUN 41, Potassium 4.8, Sodium 143, GFR 24 A complete set of results can be found in Results Review   Recommendations:  Unable to reach.       Follow-up plan: ICM clinic phone appointment on 03/08/2023.   91 day device clinic remote transmission 05/10/2023.     EP/Cardiology Office Visits:  Recall 08/19/2023 with Dr Elberta Fortis.   06/30/2023 with Dr Jens Som.   Copy of ICM check sent to Dr. Elberta Fortis.  3 month ICM trend: 02/08/2023.    12-14 Month ICM trend:     Karie Soda, RN 02/08/2023 2:54 PM

## 2023-02-10 LAB — CUP PACEART REMOTE DEVICE CHECK
Battery Remaining Longevity: 64 mo
Battery Remaining Percentage: 87 %
Battery Voltage: 2.98 V
Brady Statistic AP VP Percent: 71 %
Brady Statistic AP VS Percent: 1 %
Brady Statistic AS VP Percent: 28 %
Brady Statistic AS VS Percent: 1 %
Brady Statistic RA Percent Paced: 52 %
Date Time Interrogation Session: 20241028024000
HighPow Impedance: 59 Ohm
Implantable Lead Connection Status: 753985
Implantable Lead Connection Status: 753985
Implantable Lead Connection Status: 753985
Implantable Lead Implant Date: 20121219
Implantable Lead Implant Date: 20121219
Implantable Lead Implant Date: 20121219
Implantable Lead Location: 753858
Implantable Lead Location: 753859
Implantable Lead Location: 753860
Implantable Pulse Generator Implant Date: 20240126
Lead Channel Impedance Value: 310 Ohm
Lead Channel Impedance Value: 380 Ohm
Lead Channel Impedance Value: 660 Ohm
Lead Channel Pacing Threshold Amplitude: 0.75 V
Lead Channel Pacing Threshold Amplitude: 0.75 V
Lead Channel Pacing Threshold Amplitude: 2.625 V
Lead Channel Pacing Threshold Pulse Width: 0.5 ms
Lead Channel Pacing Threshold Pulse Width: 0.5 ms
Lead Channel Pacing Threshold Pulse Width: 0.6 ms
Lead Channel Sensing Intrinsic Amplitude: 11.8 mV
Lead Channel Sensing Intrinsic Amplitude: 3.9 mV
Lead Channel Setting Pacing Amplitude: 2 V
Lead Channel Setting Pacing Amplitude: 2.5 V
Lead Channel Setting Pacing Amplitude: 3.125
Lead Channel Setting Pacing Pulse Width: 0.5 ms
Lead Channel Setting Pacing Pulse Width: 0.6 ms
Lead Channel Setting Sensing Sensitivity: 0.5 mV
Pulse Gen Serial Number: 211014942

## 2023-02-11 ENCOUNTER — Encounter (HOSPITAL_COMMUNITY)
Admission: RE | Disposition: A | Discharge status: Home / Self Care | Payer: Self-pay | Source: Home / Self Care | Attending: Cardiology

## 2023-02-11 ENCOUNTER — Other Ambulatory Visit: Payer: Self-pay

## 2023-02-11 ENCOUNTER — Encounter (HOSPITAL_COMMUNITY): Payer: Self-pay | Admitting: Cardiology

## 2023-02-11 ENCOUNTER — Ambulatory Visit (HOSPITAL_BASED_OUTPATIENT_CLINIC_OR_DEPARTMENT_OTHER)
Admission: RE | Admit: 2023-02-11 | Discharge: 2023-02-11 | Disposition: A | Discharge status: Home / Self Care | Payer: Medicare HMO | Source: Ambulatory Visit | Attending: Physician Assistant | Admitting: Physician Assistant

## 2023-02-11 ENCOUNTER — Ambulatory Visit (HOSPITAL_COMMUNITY): Payer: Medicare HMO | Admitting: Anesthesiology

## 2023-02-11 ENCOUNTER — Ambulatory Visit (HOSPITAL_COMMUNITY)
Admission: RE | Admit: 2023-02-11 | Discharge: 2023-02-11 | Disposition: A | Discharge status: Home / Self Care | Payer: Medicare HMO | Attending: Cardiology | Admitting: Cardiology

## 2023-02-11 DIAGNOSIS — I2510 Atherosclerotic heart disease of native coronary artery without angina pectoris: Secondary | ICD-10-CM | POA: Diagnosis not present

## 2023-02-11 DIAGNOSIS — Z7901 Long term (current) use of anticoagulants: Secondary | ICD-10-CM | POA: Diagnosis not present

## 2023-02-11 DIAGNOSIS — N189 Chronic kidney disease, unspecified: Secondary | ICD-10-CM | POA: Diagnosis not present

## 2023-02-11 DIAGNOSIS — I252 Old myocardial infarction: Secondary | ICD-10-CM | POA: Insufficient documentation

## 2023-02-11 DIAGNOSIS — Z79899 Other long term (current) drug therapy: Secondary | ICD-10-CM | POA: Insufficient documentation

## 2023-02-11 DIAGNOSIS — I442 Atrioventricular block, complete: Secondary | ICD-10-CM | POA: Insufficient documentation

## 2023-02-11 DIAGNOSIS — Z955 Presence of coronary angioplasty implant and graft: Secondary | ICD-10-CM | POA: Diagnosis not present

## 2023-02-11 DIAGNOSIS — I129 Hypertensive chronic kidney disease with stage 1 through stage 4 chronic kidney disease, or unspecified chronic kidney disease: Secondary | ICD-10-CM | POA: Diagnosis not present

## 2023-02-11 DIAGNOSIS — Z95810 Presence of automatic (implantable) cardiac defibrillator: Secondary | ICD-10-CM | POA: Diagnosis not present

## 2023-02-11 DIAGNOSIS — Z951 Presence of aortocoronary bypass graft: Secondary | ICD-10-CM | POA: Insufficient documentation

## 2023-02-11 DIAGNOSIS — I4891 Unspecified atrial fibrillation: Secondary | ICD-10-CM

## 2023-02-11 DIAGNOSIS — I4819 Other persistent atrial fibrillation: Secondary | ICD-10-CM

## 2023-02-11 DIAGNOSIS — I4892 Unspecified atrial flutter: Secondary | ICD-10-CM

## 2023-02-11 DIAGNOSIS — I255 Ischemic cardiomyopathy: Secondary | ICD-10-CM | POA: Insufficient documentation

## 2023-02-11 HISTORY — PX: CARDIOVERSION: EP1203

## 2023-02-11 LAB — POCT I-STAT, CHEM 8
BUN: 43 mg/dL — ABNORMAL HIGH (ref 8–23)
Calcium, Ion: 1.2 mmol/L (ref 1.15–1.40)
Chloride: 108 mmol/L (ref 98–111)
Creatinine, Ser: 2.3 mg/dL — ABNORMAL HIGH (ref 0.61–1.24)
Glucose, Bld: 86 mg/dL (ref 70–99)
HCT: 40 % (ref 39.0–52.0)
Hemoglobin: 13.6 g/dL (ref 13.0–17.0)
Potassium: 4.7 mmol/L (ref 3.5–5.1)
Sodium: 143 mmol/L (ref 135–145)
TCO2: 25 mmol/L (ref 22–32)

## 2023-02-11 LAB — CBC
HCT: 42 % (ref 39.0–52.0)
Hemoglobin: 12.7 g/dL — ABNORMAL LOW (ref 13.0–17.0)
MCH: 28.3 pg (ref 26.0–34.0)
MCHC: 30.2 g/dL (ref 30.0–36.0)
MCV: 93.8 fL (ref 80.0–100.0)
Platelets: 169 10*3/uL (ref 150–400)
RBC: 4.48 MIL/uL (ref 4.22–5.81)
RDW: 14.8 % (ref 11.5–15.5)
WBC: 5.7 10*3/uL (ref 4.0–10.5)
nRBC: 0 % (ref 0.0–0.2)

## 2023-02-11 SURGERY — CARDIOVERSION (CATH LAB)
Anesthesia: General

## 2023-02-11 MED ORDER — LIDOCAINE HCL (CARDIAC) PF 100 MG/5ML IV SOSY
PREFILLED_SYRINGE | INTRAVENOUS | Status: DC | PRN
  Administered 2023-02-11: 60 mg via INTRAVENOUS

## 2023-02-11 MED ORDER — ETOMIDATE 2 MG/ML IV SOLN
INTRAVENOUS | Status: DC | PRN
  Administered 2023-02-11: 14 mg via INTRAVENOUS

## 2023-02-11 SURGICAL SUPPLY — 1 items: PAD DEFIB RADIO PHYSIO CONN (PAD) ×1 IMPLANT

## 2023-02-11 NOTE — Anesthesia Postprocedure Evaluation (Signed)
Anesthesia Post Note  Patient: Dave Daniels  Procedure(s) Performed: CARDIOVERSION (CATH LAB)     Patient location during evaluation: Cath Lab Anesthesia Type: General Level of consciousness: awake and alert Pain management: pain level controlled Vital Signs Assessment: post-procedure vital signs reviewed and stable Respiratory status: spontaneous breathing, nonlabored ventilation, respiratory function stable and patient connected to nasal cannula oxygen Cardiovascular status: blood pressure returned to baseline and stable Postop Assessment: no apparent nausea or vomiting Anesthetic complications: no   There were no known notable events for this encounter.  Last Vitals:  Vitals:   02/11/23 1323 02/11/23 1328  BP: 134/86 (!) 143/79  Pulse:  60  Resp:  20  Temp:    SpO2:      Last Pain:  Vitals:   02/11/23 1309  TempSrc: Temporal  PainSc: 0-No pain                 Collene Schlichter

## 2023-02-11 NOTE — CV Procedure (Signed)
Electrical Cardioversion Procedure Note Dave Daniels [id number] 1940/06/02  Procedure: Electrical Cardioversion Indications:  Atrial Fibrillation  Time Out: Verified patient identification, verified procedure,medications/allergies/relevent history reviewed, required imaging and test results available.  Performed  Procedure Details  The patient signed informed consent.   The patient was NPO past midnight. Has had therapeutic anticoagulation with Eliquis greater than 3 weeks. The patient denies any interruption of anticoagulation.  Prior to procedure his device was interrogated noted to still be in atrial fibrillation.  Anesthesia was administered by Dr. Desmond Lope.  Adequate airway was maintained throughout and vital followed per protocol.  He was cardioverted x 1 with 200 J of biphasic synchronized energy.  He converted to NSR.  There were no apparent complications.  The patient tolerated the procedure well and had normal neuro status and respiratory status post procedure with vitals stable as recorded elsewhere.     IMPRESSION:  Successful cardioversion of atrial fibrillation   Follow up:  We will arrange follow up with primary cardiology team.  He will continue on current medical therapy.  The patient advised to continue anticoagulation.  Kymberly Blomberg 02/11/2023, 1:03 PM

## 2023-02-11 NOTE — Patient Instructions (Signed)
Cardioversion scheduled for:   - Arrive at the [facility] and go to admitting at   - Do not eat or drink anything after midnight the night prior to your procedure.   - Take all your morning medication (except diabetic medications) with a sip of water prior to arrival.  - You will not be able to drive home after your procedure.    - Do NOT miss any doses of your blood thinner - if you should miss a dose please notify our office immediately.   - If you feel as if you go back into normal rhythm prior to scheduled cardioversion, please notify our office immediately.   If your procedure is canceled in the cardioversion suite you will be charged a cancellation fee.    Hold below medications 7 days prior to scheduled procedure/anesthesia.  Restart medication on the normal dosing day after scheduled procedure/anesthesia  Dulaglutide (Trulicity) Exenatide extended release (Bydureon bcise) Semaglutide (Ozempic) (WEGOVY)  Tirzepatide (Mounjaro)     Hold below medications 72 hours prior to scheduled procedure/anesthesia. Restart medication on the following day after scheduled procedure/anesthesia Bexagliflozin (Brenzavvy) Canagliflozin (Invokana) Canagliflozin/metformin (Invokamet/Invokamet XR) Dapagliflozin Marcelline Deist) Dapagliflozin/metformin (Xigduo XR) Dapagliflozin/saxagliptin Colbert Coyer) Empagliflozin (Jardiance) Empagliflozin/linagliptin (Glyxambi) Empagliflozin/linagliptin/metformin (Trijardy XR) Empagliflozin/metformin (Synjardy/Synjardy XR) Ertugliflozin (Steglatro) Ertugliflozin/metformin (Segluromet) Ertugliflozin/sitagliptin (Steglujan)    Hold below medications 24 hours prior to scheduled procedure/anesthesia.   Restart medication on the following day after scheduled procedure/anesthesia   Exenatide (Byetta)  Liraglutide (Victoza, Saxenda)  Lixisenatide (Adlyxin)  Semaglutide (Rybelsus) Polyethylene Glycol Loxenatide        For those patients who have  a scheduled procedure/anesthesia on the same day of the week as their dose, hold the medication on the day of surgery.  They can take their scheduled dose the week before.  **Patients on the above medications scheduled for elective procedures that have not held the medication for the appropriate amount of time are at risk of cancellation or change in the anesthetic plan.

## 2023-02-11 NOTE — Anesthesia Preprocedure Evaluation (Addendum)
Anesthesia Evaluation  Patient identified by MRN, date of birth, ID band Patient awake    Reviewed: Allergy & Precautions, NPO status , Patient's Chart, lab work & pertinent test results, reviewed documented beta blocker date and time   Airway Mallampati: II  TM Distance: >3 FB Neck ROM: Full    Dental  (+) Dental Advisory Given, Partial Upper   Pulmonary neg pulmonary ROS   Pulmonary exam normal breath sounds clear to auscultation       Cardiovascular hypertension, Pt. on home beta blockers + CAD, + Past MI, + Cardiac Stents and + CABG  Normal cardiovascular exam+ dysrhythmias (LBBB) Atrial Fibrillation + Cardiac Defibrillator  Rhythm:Regular Rate:Normal  Echo 09/12/21: 1. Apical LV thrombus is present. Left ventricular ejection fraction, by  estimation, is 20 to 25%. The left ventricle has severely decreased  function. The left ventricle demonstrates regional wall motion  abnormalities (see scoring diagram/findings for  description). Left ventricular diastolic parameters are consistent with  Grade III diastolic dysfunction (restrictive). Elevated left atrial  pressure.   2. Right ventricular systolic function is mildly reduced. The right  ventricular size is moderately enlarged. There is moderately elevated  pulmonary artery systolic pressure.   3. Left atrial size was severely dilated.   4. Right atrial size was severely dilated.   5. The mitral valve is normal in structure. Mild mitral valve  regurgitation. No evidence of mitral stenosis.   6. Tricuspid valve regurgitation is mild to moderate.   7. The aortic valve is tricuspid. Aortic valve regurgitation is mild.  Aortic valve sclerosis/calcification is present, without any evidence of  aortic stenosis.   8. The inferior vena cava is normal in size with <50% respiratory  variability, suggesting right atrial pressure of 8 mmHg.     Neuro/Psych negative neurological ROS   negative psych ROS   GI/Hepatic negative GI ROS, Neg liver ROS,,,  Endo/Other  negative endocrine ROS    Renal/GU Renal InsufficiencyRenal disease     Musculoskeletal  (+) Arthritis ,    Abdominal   Peds  Hematology  (+) Blood dyscrasia (Eliquis)   Anesthesia Other Findings Day of surgery medications reviewed with the patient.  Reproductive/Obstetrics                             Anesthesia Physical Anesthesia Plan  ASA: 4  Anesthesia Plan: General   Post-op Pain Management: Minimal or no pain anticipated   Induction: Intravenous  PONV Risk Score and Plan: 2 and TIVA  Airway Management Planned: Mask and Natural Airway  Additional Equipment:   Intra-op Plan:   Post-operative Plan:   Informed Consent: I have reviewed the patients History and Physical, chart, labs and discussed the procedure including the risks, benefits and alternatives for the proposed anesthesia with the patient or authorized representative who has indicated his/her understanding and acceptance.     Dental advisory given  Plan Discussed with: CRNA  Anesthesia Plan Comments:        Anesthesia Quick Evaluation

## 2023-02-11 NOTE — Transfer of Care (Signed)
Immediate Anesthesia Transfer of Care Note  Patient: Dave Daniels  Procedure(s) Performed: CARDIOVERSION (CATH LAB)  Patient Location: Cath Lab  Anesthesia Type:MAC  Level of Consciousness: sedated and responds to stimulation  Airway & Oxygen Therapy: Patient Spontanous Breathing and Patient connected to nasal cannula oxygen  Post-op Assessment: Report given to RN and Post -op Vital signs reviewed and stable  Post vital signs: Reviewed and stable  Last Vitals:  Vitals Value Taken Time  BP    Temp    Pulse    Resp    SpO2      Last Pain:  Vitals:   02/11/23 1213  TempSrc: Temporal         Complications: There were no known notable events for this encounter.

## 2023-02-14 NOTE — H&P (Signed)
Done

## 2023-02-25 ENCOUNTER — Ambulatory Visit (HOSPITAL_COMMUNITY)
Admission: RE | Admit: 2023-02-25 | Discharge: 2023-02-25 | Disposition: A | Discharge status: Home / Self Care | Payer: Medicare HMO | Source: Ambulatory Visit | Attending: Internal Medicine | Admitting: Internal Medicine

## 2023-02-25 VITALS — BP 128/66 | HR 64 | Ht 67.0 in | Wt 155.8 lb

## 2023-02-25 DIAGNOSIS — I255 Ischemic cardiomyopathy: Secondary | ICD-10-CM | POA: Diagnosis present

## 2023-02-25 DIAGNOSIS — I4819 Other persistent atrial fibrillation: Secondary | ICD-10-CM | POA: Insufficient documentation

## 2023-02-25 DIAGNOSIS — I442 Atrioventricular block, complete: Secondary | ICD-10-CM | POA: Diagnosis not present

## 2023-02-25 DIAGNOSIS — Z7901 Long term (current) use of anticoagulants: Secondary | ICD-10-CM | POA: Insufficient documentation

## 2023-02-25 DIAGNOSIS — I129 Hypertensive chronic kidney disease with stage 1 through stage 4 chronic kidney disease, or unspecified chronic kidney disease: Secondary | ICD-10-CM | POA: Diagnosis present

## 2023-02-25 DIAGNOSIS — Z95810 Presence of automatic (implantable) cardiac defibrillator: Secondary | ICD-10-CM | POA: Diagnosis not present

## 2023-02-25 DIAGNOSIS — R9431 Abnormal electrocardiogram [ECG] [EKG]: Secondary | ICD-10-CM | POA: Diagnosis not present

## 2023-02-25 DIAGNOSIS — N189 Chronic kidney disease, unspecified: Secondary | ICD-10-CM | POA: Diagnosis present

## 2023-02-25 NOTE — Progress Notes (Signed)
Primary Care Physician: Cheron Schaumann., MD Referring Physician:Dr. [name] is a 82 y.o. male with a h/o CAD, CKD, CHB with ICD for h/o ischemic cardiomyopathy, HTN, that  is in the clinic for device clinic finding persistent afib since 05/07/19. He has not missed any of his eliquis 5 mg bid for a CHA2DS2VASc score of 5.   He is unaware of any irregular heart rhythm, denies fatigue/ shortness of breath.  The only trigger is that he had a spell of gout mid January.  F/u in afib clinic one week after cardioversion. He remains in a paced rhythm. He feels no different in afib vs SR.  F/u 01/18/23 Afib clinic, he is currently in atrial flutter. Device clinic alert on 9/24 for Afib/flutter in progress from 9/14; burden 7%. He does not have cardiac awareness of his Afib. He overall feels well. He may notice occasional SOB if he does something very strenuous but otherwise can do daily activity without issue. No bleeding issues on Eliquis 2.5 mg BID.   F/u in Afib clinic, 02/25/23. He is currently in AV dual paced rhythm. S/p successful DCCV on 02/11/23. He has had no episodes of Afib since cardioversion. No missed doses of Eliquis 2.5 mg BID.   Today, he denies symptoms of palpitations, chest pain, shortness of breath, orthopnea, PND, lower extremity edema, dizziness, presyncope, syncope, or neurologic sequela. The patient is tolerating medications without difficulties and is otherwise without complaint today.   Past Medical History:  Diagnosis Date   CAD (coronary artery disease)    MI in [place] with stenting in 2010, then MI with CABG in [place] 06/2010.  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   Complete heart block (HCC)    Gout    Hyperlipidemia    Hypertension    Ischemic cardiomyopathy    EF 15% by echo 11/2010 and still 20-25% by follow-up echo 02/2011, s/p [employer] Bi-V ICD implantation 04/01/11   LBBB (left bundle branch block)    Renal insufficiency    Cr 1.6 on 03/25/11    Past Surgical History:  Procedure Laterality Date   BI-VENTRICULAR IMPLANTABLE CARDIOVERTER DEFIBRILLATOR N/A 04/01/2011   Procedure: BI-VENTRICULAR IMPLANTABLE CARDIOVERTER DEFIBRILLATOR  (CRT-D);  Surgeon: Marinus Maw, MD;  Location: [facility] CATH LAB;  Service: Cardiovascular;  Laterality: N/A;   BIV ICD GENERATOR CHANGEOUT N/A 05/08/2022   Procedure: BIV ICD GENERATOR CHANGEOUT;  Surgeon: Regan Lemming, MD;  Location: [facility] INVASIVE CV LAB;  Service: Cardiovascular;  Laterality: N/A;   CARDIAC DEFIBRILLATOR PLACEMENT  12/12   BiV ICD (SJM) implanted by Dr Johney Frame   CARDIOVERSION N/A 04/22/2018   Procedure: CARDIOVERSION;  Surgeon: Quintella Reichert, MD;  Location: [facility] ENDOSCOPY;  Service: Cardiovascular;  Laterality: N/A;   CARDIOVERSION N/A 06/13/2019   Procedure: CARDIOVERSION;  Surgeon: Quintella Reichert, MD;  Location: [facility] ENDOSCOPY;  Service: Cardiovascular;  Laterality: N/A;   CARDIOVERSION N/A 02/11/2023   Procedure: CARDIOVERSION (CATH LAB);  Surgeon: Thomasene Ripple, DO;  Location: [facility] INVASIVE CV LAB;  Service: Cardiovascular;  Laterality: N/A;   Carpel tunnel surgery     CORONARY ARTERY BYPASS GRAFT  3/12   in [place]   EP IMPLANTABLE DEVICE N/A 08/19/2015   Procedure: BIV ICD Generator Changeout;  Surgeon: Hillis Range, MD;  Location: [facility] INVASIVE CV LAB;  Service: Cardiovascular;  Laterality: N/A;   INGUINAL HERNIA REPAIR     TONSILLECTOMY     UMBILICAL HERNIA REPAIR      Current Outpatient  Medications  Medication Sig Dispense Refill   acetaminophen (TYLENOL) 500 MG tablet Take 1,000 mg by mouth as needed for moderate pain or headache.      allopurinol (ZYLOPRIM) 300 MG tablet Take 300 mg by mouth daily as needed (Gout).  11   apixaban (ELIQUIS) 2.5 MG TABS tablet Take 1 tablet (2.5 mg total) by mouth 2 (two) times daily. 180 tablet 1   carvedilol (COREG) 12.5 MG tablet TAKE 1 TABLET BY MOUTH TWICE DAILY WITH A MEAL 180 tablet 0   cholecalciferol (VITAMIN D3) 25 MCG (1000 UNIT) tablet  Take 1,000 Units by mouth daily.     colchicine 0.6 MG tablet Take 0.6 mg by mouth as needed (Gout).     Evolocumab (REPATHA SURECLICK) 140 MG/ML SOAJ Inject 140 mg into the skin every 14 (fourteen) days. 6 mL 3   ezetimibe (ZETIA) 10 MG tablet Take 1 tablet (10 mg total) by mouth daily. 90 tablet 3   finasteride (PROSCAR) 5 MG tablet Take 5 mg by mouth daily.     furosemide (LASIX) 20 MG tablet Take 2 tablets by mouth once daily (Patient taking differently: Take 20 mg by mouth daily. Additional 20 mg if needed for weight gain) 180 tablet 3   hydrALAZINE (APRESOLINE) 10 MG tablet TAKE 1 TABLET BY MOUTH THREE TIMES DAILY 180 tablet 6   isosorbide mononitrate (IMDUR) 30 MG 24 hr tablet Take 1 tablet by mouth once daily 90 tablet 3   oxybutynin (DITROPAN-XL) 10 MG 24 hr tablet Take 10 mg by mouth daily.     tamsulosin (FLOMAX) 0.4 MG CAPS capsule Take 0.4 mg by mouth daily.     No current facility-administered medications for this encounter.    Allergies  Allergen Reactions   Statins Hives    Muscle pain & severe hives   Nsaids Other (See Comments)    Renal insufficiency     Tolmetin Other (See Comments)    Renal insufficiency   Atorvastatin Rash   Pravastatin Rash    rash   Shellfish Allergy Rash    ROS- All systems are reviewed and negative except as per the HPI above  Physical Exam: Vitals:   02/25/23 1410  BP: 128/66  Pulse: 64  Weight: 70.7 kg  Height: 5\' 7"  (1.702 m)     Wt Readings from Last 3 Encounters:  02/25/23 70.7 kg  02/11/23 78 kg  01/18/23 66.3 kg    Labs: Lab Results  Component Value Date   NA 143 02/11/2023   K 4.7 02/11/2023   CL 108 02/11/2023   CO2 24 01/12/2023   GLUCOSE 86 02/11/2023   BUN 43 (H) 02/11/2023   CREATININE 2.30 (H) 02/11/2023   CALCIUM 9.5 01/12/2023   Lab Results  Component Value Date   INR 1.0 03/25/2011   Lab Results  Component Value Date   CHOL 87 (L) 01/27/2023   HDL 47 01/27/2023   LDLCALC 24 01/27/2023    TRIG 76 01/27/2023   GEN- The patient is well appearing, alert and oriented x 3 today.   Neck - no JVD or carotid bruit noted Lungs- Clear to ausculation bilaterally, normal work of breathing Heart- Regular rate and rhythm, no murmurs, rubs or gallops, PMI not laterally displaced Extremities- no clubbing, cyanosis, or edema Skin - no rash or ecchymosis noted   EKG- Vent. rate 64 BPM PR interval 188 ms QRS duration 166 ms QT/QTcB 510/526 ms P-R-T axes 85 -83 95 AV dual-paced rhythm with occasional Premature ventricular  complexes Biventricular pacemaker detected Abnormal ECG When compared with ECG of 11-Feb-2023 13:16, PREVIOUS ECG IS PRESENT  ECHO 09/12/21: 1. Apical LV thrombus is present. Left ventricular ejection fraction, by  estimation, is 20 to 25%. The left ventricle has severely decreased  function. The left ventricle demonstrates regional wall motion  abnormalities (see scoring diagram/findings for  description). Left ventricular diastolic parameters are consistent with  Grade III diastolic dysfunction (restrictive). Elevated left atrial  pressure.   2. Right ventricular systolic function is mildly reduced. The right  ventricular size is moderately enlarged. There is moderately elevated  pulmonary artery systolic pressure.   3. Left atrial size was severely dilated.   4. Right atrial size was severely dilated.   5. The mitral valve is normal in structure. Mild mitral valve  regurgitation. No evidence of mitral stenosis.   6. Tricuspid valve regurgitation is mild to moderate.   7. The aortic valve is tricuspid. Aortic valve regurgitation is mild.  Aortic valve sclerosis/calcification is present, without any evidence of  aortic stenosis.   8. The inferior vena cava is normal in size with <50% respiratory  variability, suggesting right atrial pressure of 8 mmHg.    Assessment and Plan: 1. Persistent  afib since 05/07/19 He was asymptomatic Continue carvedilol 12.5 mg  bid Cardioversion 06/12/19 was successful. S/p successful DCCV on 02/11/23.   He is currently in AV dual paced rhythm. We will continue conservative observation via device clinic for now. No change in current medication regimen. Continue Eliquis 2.5 mg BID. Continue coreg 12.5 mg BID.   His CrCl is prohibitive of Tikosyn. He would be a candidate for amiodarone as AAD therapy. Discussion of potential adverse effects and benefits of medication helping him maintain normal rhythm. We also discussed cardioversion with or without addition of medication and how may not remain in normal rhythm afterwards. Discussion with EP whether he could be a candidate for ablation given his renal disease. He would like to discuss further with wife and family. He will contact clinic when he has made a decision on how to proceed.    Will order updated echocardiogram and forward results to Dr. Jens Som.  2.CHA2DS2VASc score of at least 5 Doing well on eliquis 2.5 mg bid States no missed doses     Follow up as scheduled with Dr. Jens Som. Follow up Afib clinic prn.    Dave Mend, PA-C [facility] [address] [place], [place] [address] [phone]

## 2023-02-26 ENCOUNTER — Other Ambulatory Visit (HOSPITAL_COMMUNITY): Payer: Self-pay

## 2023-02-26 DIAGNOSIS — I480 Paroxysmal atrial fibrillation: Secondary | ICD-10-CM

## 2023-02-26 MED ORDER — APIXABAN 2.5 MG PO TABS: 2.5000 mg | ORAL_TABLET | Freq: Two times a day (BID) | ORAL | Status: DC

## 2023-03-01 NOTE — Progress Notes (Signed)
Remote ICD transmission.   

## 2023-03-01 NOTE — H&P (Signed)
hp

## 2023-03-08 ENCOUNTER — Ambulatory Visit: Payer: Medicare HMO | Attending: Cardiology

## 2023-03-08 DIAGNOSIS — Z95810 Presence of automatic (implantable) cardiac defibrillator: Secondary | ICD-10-CM

## 2023-03-08 DIAGNOSIS — I5022 Chronic systolic (congestive) heart failure: Secondary | ICD-10-CM | POA: Diagnosis not present

## 2023-03-09 ENCOUNTER — Telehealth: Payer: Self-pay

## 2023-03-09 NOTE — Telephone Encounter (Signed)
Remote ICM transmission received.  Attempted call to patient regarding ICM remote transmission and no answer.

## 2023-03-09 NOTE — Progress Notes (Signed)
EPIC Encounter for ICM Monitoring  Patient Name: Dave Daniels is a 82 y.o. male Date: 03/09/2023 Primary Care Physican: Cheron Schaumann., MD Primary Cardiologist: Jens Som Electrophysiologist: Elberta Fortis Nephrologist: [facility] Bi-V Pacing: 97%    07/03/2022 Weight: 150 lbs 10/16/2022 Weight: 142 -143 lbs lbs 12/28/2022 Weight: 142 lbs       AT/AF Burden 0% (taking Eliquis)                                     Attempted call to patient and unable to reach.  Transmission reviewed.    Corvue thoracic impedance suggesting intermittent days with possible fluid accumulation.  Difficulty maintaining normal fluid levels since 12/2022.     Prescribed:  Furosemide 20 mg take 2 tablet(s) (40 mg total) by mouth daily.      Labs: 01/12/2023 Creatinine 2.32, BUN 35, Potassium 3.9, Sodium 146 12/27/2022 Creatinine 2.83, BUN 40, Potassium 4.2, Sodium 139  12/25/2022 Creatinine 2.85, BUN 37, Potassium 6.2, Sodium 147, GFR 21 (potassium level has been addressed per 9/14 note) 09/16/2022 Creatinine 2.06, BUN 30, Potassium 5.0, Sodium 145, GFR 32 09/14/2022 Creatinine 2.14, BUN 29, Potassium 4.6, Sodium 143, GFR 30, BNP 2,262 05/04/2022 Creatinine 2.59, BUN 41, Potassium 4.8, Sodium 143, GFR 24 A complete set of results can be found in Results Review   Recommendations:  Unable to reach.       Follow-up plan: ICM clinic phone appointment on 04/12/2023.   91 day device clinic remote transmission 05/10/2023.     EP/Cardiology Office Visits:  Recall 08/19/2023 with Dr Elberta Fortis.   06/30/2023 with Dr Jens Som.   Copy of ICM check sent to Dr. Elberta Fortis.  3 month ICM trend: 03/08/2023.    12-14 Month ICM trend:     Karie Soda, RN 03/09/2023 4:16 PM

## 2023-03-18 ENCOUNTER — Telehealth: Payer: Self-pay | Admitting: Cardiology

## 2023-03-18 NOTE — Telephone Encounter (Signed)
Spoke with pt, he called to let me know he is feeling aggitated and anxious. He has swelling in his feet and ankles that is there in the morning when he gets up. His weight is up 8 lbs in 2-3 weeks and he endorses orthopnea. His vital signs today were 147/89/60 98% sat. He was instructed to increase furosemide to 40 mg twice daily for 3 days. Patient voiced understanding to call if no change in symptoms or if they worsen.

## 2023-03-18 NOTE — Telephone Encounter (Signed)
Patient is requesting to speak with nurse, Stanton Kidney. He declines discussing with me and states it will be easier for him to speak directly with Stanton Kidney if possible.

## 2023-03-25 ENCOUNTER — Other Ambulatory Visit: Payer: Self-pay | Admitting: Cardiology

## 2023-03-29 ENCOUNTER — Telehealth: Payer: Self-pay | Admitting: Cardiology

## 2023-03-29 NOTE — Telephone Encounter (Signed)
RN spoke to DOD Dr. Flora Lipps, recommends ED evaluation due to worsening swelling/symptoms  ___________________________________________ Patient identification verified by 2 forms. Dave Rail, RN    Called and spoke to patient  Advised patient to present to ED per DOD recommendations  Patient agrees with plan, no questions at this time

## 2023-03-29 NOTE — Telephone Encounter (Signed)
Patient identification verified by 2 forms. Marilynn Rail, RN    Called and spoke to patient  Patient states:   -has swelling in ankles and legs   -swelling started 1 week ago   -checks weight daily   -has gained 6lbs in 1.5 week   -has new SOB, started 1 month ago, has remained the same, no worsening  -takes two 40mg  lasix in the morning and 20mg  in the afternoon   -has been taking it like this for 1.5 week  -spoke to RN on 12/5 and was advised to increase lasix to 40mg  BID for 3 days  -swelling did improve with increase but swelling has returned with decrease in Rx   -has a lot of fatigue, and depressed mood   -unsure if issue could be related to kidney  Informed patient RN will speak to DOD and outreach with recommendations  Patient agrees with plan

## 2023-03-29 NOTE — Telephone Encounter (Signed)
Pt c/o swelling: STAT is pt has developed SOB within 24 hours  How much weight have you gained and in what time span? 6 lbs in 1 to 2 days   If swelling, where is the swelling located? Ankles and legs   Are you currently taking a fluid pill? Yes   Are you currently SOB? Yes - for 3 weeks    Do you have a log of your daily weights (if so, list)?   Have you gained 3 pounds in a day or 5 pounds in a week? Yes   Have you traveled recently? No

## 2023-03-30 ENCOUNTER — Other Ambulatory Visit: Payer: Self-pay

## 2023-03-30 ENCOUNTER — Emergency Department (HOSPITAL_COMMUNITY): Payer: Medicare HMO

## 2023-03-30 ENCOUNTER — Encounter (HOSPITAL_COMMUNITY): Payer: Self-pay

## 2023-03-30 ENCOUNTER — Emergency Department (HOSPITAL_COMMUNITY)
Admission: EM | Admit: 2023-03-30 | Discharge: 2023-03-30 | Disposition: A | Discharge status: Home / Self Care | Payer: Medicare HMO | Attending: Emergency Medicine | Admitting: Emergency Medicine

## 2023-03-30 DIAGNOSIS — R0602 Shortness of breath: Secondary | ICD-10-CM | POA: Diagnosis present

## 2023-03-30 DIAGNOSIS — R600 Localized edema: Secondary | ICD-10-CM | POA: Diagnosis not present

## 2023-03-30 DIAGNOSIS — Z7901 Long term (current) use of anticoagulants: Secondary | ICD-10-CM | POA: Insufficient documentation

## 2023-03-30 LAB — CBC WITH DIFFERENTIAL/PLATELET
Abs Immature Granulocytes: 0.02 10*3/uL (ref 0.00–0.07)
Basophils Absolute: 0 10*3/uL (ref 0.0–0.1)
Basophils Relative: 1 %
Eosinophils Absolute: 0.1 10*3/uL (ref 0.0–0.5)
Eosinophils Relative: 2 %
HCT: 39.2 % (ref 39.0–52.0)
Hemoglobin: 12.4 g/dL — ABNORMAL LOW (ref 13.0–17.0)
Immature Granulocytes: 0 %
Lymphocytes Relative: 8 %
Lymphs Abs: 0.5 10*3/uL — ABNORMAL LOW (ref 0.7–4.0)
MCH: 28.7 pg (ref 26.0–34.0)
MCHC: 31.6 g/dL (ref 30.0–36.0)
MCV: 90.7 fL (ref 80.0–100.0)
Monocytes Absolute: 0.5 10*3/uL (ref 0.1–1.0)
Monocytes Relative: 8 %
Neutro Abs: 5.5 10*3/uL (ref 1.7–7.7)
Neutrophils Relative %: 81 %
Platelets: 161 10*3/uL (ref 150–400)
RBC: 4.32 MIL/uL (ref 4.22–5.81)
RDW: 14.8 % (ref 11.5–15.5)
WBC: 6.8 10*3/uL (ref 4.0–10.5)
nRBC: 0 % (ref 0.0–0.2)

## 2023-03-30 LAB — BASIC METABOLIC PANEL
Anion gap: 15 (ref 5–15)
BUN: 45 mg/dL — ABNORMAL HIGH (ref 8–23)
CO2: 24 mmol/L (ref 22–32)
Calcium: 9.1 mg/dL (ref 8.9–10.3)
Chloride: 100 mmol/L (ref 98–111)
Creatinine, Ser: 2.44 mg/dL — ABNORMAL HIGH (ref 0.61–1.24)
GFR, Estimated: 26 mL/min — ABNORMAL LOW (ref >60)
Glucose, Bld: 98 mg/dL (ref 70–99)
Potassium: 3.6 mmol/L (ref 3.5–5.1)
Sodium: 139 mmol/L (ref 135–145)

## 2023-03-30 LAB — BRAIN NATRIURETIC PEPTIDE: B Natriuretic Peptide: 2736.6 pg/mL — ABNORMAL HIGH (ref 0.0–100.0)

## 2023-03-30 MED ORDER — FUROSEMIDE 10 MG/ML IJ SOLN
40.0000 mg | Freq: Once | INTRAMUSCULAR | Status: AC
  Administered 2023-03-30: 40 mg via INTRAVENOUS
  Filled 2023-03-30: qty 4

## 2023-03-30 NOTE — ED Triage Notes (Addendum)
Patient has been having sob, fatigue, leg swelling worsening since 12/6.  MD prescribed him to increased furosemide but has not helped with sob or leg swelling.  Denies CP  Last year had a thrombus in atria and EF 20-25%.  Patient has pacemaker defib in place.

## 2023-03-30 NOTE — ED Provider Triage Note (Signed)
Emergency Medicine Provider Triage Evaluation Note  Dave Daniels , a 82 y.o. male  was evaluated in triage. History of CABG, CAD, HTN. Has pacemaker. Pt complains of shortness of breath and fatigue ongoing for about 2 weeks. Increased bilateral leg edema. PCP increased lasix without improvement. Denies chest pain, abdominal pain.  Review of Systems  Positive: Shortness of breath, fatigue Negative: Chest pain, abdominal pain  Physical Exam  BP 135/76 (BP Location: Right Arm)   Pulse 66   Temp 97.8 F (36.6 C)   Resp 18   Ht 5\' 7"  (1.702 m)   Wt 70.3 kg   SpO2 99%   BMI 24.28 kg/m  Gen:   Awake, no distress   Resp:  Normal effort  MSK:   Moves extremities without difficulty  Other:  Lungs CTA bilaterally. Legs with 2+ pitting edema.  Medical Decision Making  Medically screening exam initiated at 2:36 PM.  Appropriate orders placed.  [facility] was informed that the remainder of the evaluation will be completed by another provider, this initial triage assessment does not replace that evaluation, and the importance of remaining in the ED until their evaluation is complete.     Felicie Morn, NP 03/30/23 1446

## 2023-03-30 NOTE — ED Provider Notes (Addendum)
[place] EMERGENCY DEPARTMENT AT [facility] Provider Note   CSN: [id number] Arrival date & time: 03/30/23  1400     History  Chief Complaint  Patient presents with   Shortness of Breath   Leg Swelling    Dave Daniels is a 82 y.o. male.  Patient followed by cardiology.  Patient known to have EF of 2025%.  Has pacemaker defibrillator in place.'s been there since 2012.  Patient without any chest pain.  Patient's been having trouble with fatigue some shortness of breath and leg swelling since December 6.  His cardiology doctors recommended that he increase his Lasix to 40 mg twice a day.  He has not had any Lasix today.  Some days he does not take the 40 mg in the evening.  Because he will be up all night with urination.  Patient is never used tobacco products.  Patient is oxygen saturation 97% on room air which is very reassuring.  In addition patient is on Eliquis.       Home Medications Prior to Admission medications   Medication Sig Start Date End Date Taking? Authorizing Provider  acetaminophen (TYLENOL) 500 MG tablet Take 1,000 mg by mouth as needed for moderate pain or headache.     [provider]  allopurinol (ZYLOPRIM) 300 MG tablet Take 300 mg by mouth daily as needed (Gout). 12/31/17   [provider]  apixaban (ELIQUIS) 2.5 MG TABS tablet Take 1 tablet (2.5 mg total) by mouth 2 (two) times daily. 02/25/23   Eustace Pen, PA-C  carvedilol (COREG) 12.5 MG tablet TAKE 1 TABLET BY MOUTH TWICE DAILY WITH A MEAL 03/25/23   Lewayne Bunting, MD  cholecalciferol (VITAMIN D3) 25 MCG (1000 UNIT) tablet Take 1,000 Units by mouth daily.    [provider]  colchicine 0.6 MG tablet Take 0.6 mg by mouth as needed (Gout).    [provider]  Evolocumab (REPATHA SURECLICK) 140 MG/ML SOAJ Inject 140 mg into the skin every 14 (fourteen) days. 12/09/22   Lewayne Bunting, MD  ezetimibe (ZETIA) 10 MG tablet Take 1 tablet (10 mg total) by  mouth daily. 12/09/22   Lewayne Bunting, MD  finasteride (PROSCAR) 5 MG tablet Take 5 mg by mouth daily.    [provider]  furosemide (LASIX) 20 MG tablet Take 2 tablets by mouth once daily Patient taking differently: Take 20 mg by mouth daily. Additional 20 mg if needed for weight gain 02/02/23   Lewayne Bunting, MD  hydrALAZINE (APRESOLINE) 10 MG tablet TAKE 1 TABLET BY MOUTH THREE TIMES DAILY 01/19/23   Lewayne Bunting, MD  isosorbide mononitrate (IMDUR) 30 MG 24 hr tablet Take 1 tablet by mouth once daily 09/08/22   Camnitz, Andree Coss, MD  oxybutynin (DITROPAN-XL) 10 MG 24 hr tablet Take 10 mg by mouth daily.    [provider]  tamsulosin (FLOMAX) 0.4 MG CAPS capsule Take 0.4 mg by mouth daily. 02/03/22   [provider]      Allergies    Statins, Nsaids, Tolmetin, Atorvastatin, Pravastatin, and Shellfish allergy    Review of Systems   Review of Systems  Constitutional:  Positive for fatigue. Negative for chills and fever.  HENT:  Negative for ear pain and sore throat.   Eyes:  Negative for pain and visual disturbance.  Respiratory:  Positive for shortness of breath. Negative for cough.   Cardiovascular:  Positive for leg swelling. Negative for chest pain and palpitations.  Gastrointestinal:  Negative for abdominal pain and vomiting.  Genitourinary:  Negative for dysuria and hematuria.  Musculoskeletal:  Negative for arthralgias and back pain.  Skin:  Negative for color change and rash.  Neurological:  Negative for seizures and syncope.  All other systems reviewed and are negative.   Physical Exam Updated Vital Signs BP (!) 146/86 (BP Location: Right Arm)   Pulse 72   Temp 98.5 F (36.9 C) (Oral)   Resp 18   Ht 1.702 m (5\' 7" )   Wt 70.3 kg   SpO2 100%   BMI 24.28 kg/m  Physical Exam Vitals and nursing note reviewed.  Constitutional:      General: He is not in acute distress.    Appearance: Normal appearance. He is well-developed. He  is not ill-appearing.  HENT:     Head: Normocephalic and atraumatic.     Mouth/Throat:     Mouth: Mucous membranes are moist.  Eyes:     Extraocular Movements: Extraocular movements intact.     Conjunctiva/sclera: Conjunctivae normal.     Pupils: Pupils are equal, round, and reactive to light.  Cardiovascular:     Rate and Rhythm: Normal rate and regular rhythm.     Heart sounds: No murmur heard. Pulmonary:     Effort: Pulmonary effort is normal. No respiratory distress.     Breath sounds: Normal breath sounds. No stridor. No wheezing, rhonchi or rales.  Abdominal:     General: There is no distension.     Palpations: Abdomen is soft.     Tenderness: There is no abdominal tenderness. There is no guarding.  Musculoskeletal:        General: No swelling.     Cervical back: Normal range of motion and neck supple.     Right lower leg: Edema present.     Left lower leg: Edema present.  Skin:    General: Skin is warm and dry.     Capillary Refill: Capillary refill takes less than 2 seconds.  Neurological:     General: No focal deficit present.     Mental Status: He is alert and oriented to person, place, and time.  Psychiatric:        Mood and Affect: Mood normal.     ED Results / Procedures / Treatments   Labs (all labs ordered are listed, but only abnormal results are displayed) Labs Reviewed  BASIC METABOLIC PANEL - Abnormal; Notable for the following components:      Result Value   BUN 45 (*)    Creatinine, Ser 2.44 (*)    GFR, Estimated 26 (*)    All other components within normal limits  BRAIN NATRIURETIC PEPTIDE - Abnormal; Notable for the following components:   B Natriuretic Peptide 2,736.6 (*)    All other components within normal limits  CBC WITH DIFFERENTIAL/PLATELET - Abnormal; Notable for the following components:   Hemoglobin 12.4 (*)    Lymphs Abs 0.5 (*)    All other components within normal limits    EKG EKG Interpretation Date/Time:  Tuesday March 30 2023 14:08:08 EST Ventricular Rate:  65 PR Interval:  196 QRS Duration:  172 QT Interval:  518 QTC Calculation: 538 R Axis:   -87  Text Interpretation: Atrial-sensed ventricular-paced rhythm Biventricular pacemaker detected Abnormal ECG When compared with ECG of 25-Feb-2023 14:24, PREVIOUS ECG IS PRESENT Confirmed by Vanetta Mulders [phone]) on 03/30/2023 4:58:58 PM  Radiology DG Chest 2 View Result Date: 03/30/2023 CLINICAL DATA:  Shortness of  breath.  Leg swelling. EXAM: CHEST - 2 VIEW COMPARISON:  10/21/2022 FINDINGS: Mild cardiomegaly, slightly increased from prior exam. Left-sided pacemaker in place. Prior median sternotomy. Small right pleural effusion with mild adjacent patchy opacity at the right lung base. Slight vascular congestion without pulmonary edema. No pneumothorax. IMPRESSION: 1. Mild cardiomegaly, slightly increased from prior exam. 2. Small right pleural effusion with mild adjacent patchy opacity at the right lung base, may represent atelectasis or pneumonia. Electronically Signed   By: Narda Rutherford M.D.   On: 03/30/2023 16:31    Procedures Procedures    Medications Ordered in ED Medications  furosemide (LASIX) injection 40 mg (has no administration in time range)    ED Course/ Medical Decision Making/ A&P                                 Medical Decision Making Amount and/or Complexity of Data Reviewed Labs: ordered. Radiology: ordered.  Risk Prescription drug management.   EKG consistent with paced rhythm.  CBC no leukocytosis hemoglobin 12.4 platelets of 161.  Patient metabolic panel electrolytes are normal renal function GFR 26 kind of baseline for him creatinine 2.44.  Patient's BNP is significantly elevated 2736.  Chest x-ray shows mild cardiomegaly slight increase from prior exam small right pleural effusion with mild adjacent patchy opacities at the right lung base may represent atelectasis or pneumonia.  Does not show significant CHF even  though BNP is markedly elevated.  Will get CT chest without contrast to evaluate further.  Will give 40 mg of Lasix IV.  CT chest without contrast showed moderate layering right side pleural effusion with mild right basilar atelectasis or consolidation cardiomegaly with some dilated central pulmonary vascular suggestive of pulmonary arterial hypertension small volume ascites in the upper abdomen.  No evidence of any significant pulmonary edema.  Patient is stable for discharge home emphasizing that he do the 40 mg of Lasix twice a day and then follow back up with cardiology.  Patient is not in any respiratory distress.  Oxygen saturations have been 99%.   Final Clinical Impression(s) / ED Diagnoses Final diagnoses:  Shortness of breath  Bilateral lower extremity edema    Rx / DC Orders ED Discharge Orders     None         Vanetta Mulders, MD 03/30/23 1716    Vanetta Mulders, MD 03/30/23 2012

## 2023-03-30 NOTE — Discharge Instructions (Signed)
Contact your cardiology group for follow-up.  Continue taking the Lasix 40 mg twice a day.  You will need close follow-up.  Return for any of new problem with trouble breathing or feeling short of breath.  Or for any chest pain.

## 2023-04-08 ENCOUNTER — Inpatient Hospital Stay (HOSPITAL_COMMUNITY): Payer: Medicare HMO

## 2023-04-08 ENCOUNTER — Other Ambulatory Visit: Payer: Self-pay

## 2023-04-08 ENCOUNTER — Emergency Department (HOSPITAL_BASED_OUTPATIENT_CLINIC_OR_DEPARTMENT_OTHER): Payer: Medicare HMO

## 2023-04-08 ENCOUNTER — Encounter (HOSPITAL_BASED_OUTPATIENT_CLINIC_OR_DEPARTMENT_OTHER): Payer: Self-pay

## 2023-04-08 ENCOUNTER — Telehealth: Payer: Self-pay | Admitting: Cardiology

## 2023-04-08 ENCOUNTER — Inpatient Hospital Stay (HOSPITAL_BASED_OUTPATIENT_CLINIC_OR_DEPARTMENT_OTHER)
Admission: EM | Admit: 2023-04-08 | Discharge: 2023-04-11 | DRG: 291 | Disposition: A | Discharge status: Home / Self Care | Payer: Medicare HMO | Attending: Internal Medicine | Admitting: Internal Medicine

## 2023-04-08 DIAGNOSIS — E785 Hyperlipidemia, unspecified: Secondary | ICD-10-CM | POA: Diagnosis present

## 2023-04-08 DIAGNOSIS — I130 Hypertensive heart and chronic kidney disease with heart failure and stage 1 through stage 4 chronic kidney disease, or unspecified chronic kidney disease: Principal | ICD-10-CM | POA: Diagnosis present

## 2023-04-08 DIAGNOSIS — I255 Ischemic cardiomyopathy: Secondary | ICD-10-CM | POA: Diagnosis present

## 2023-04-08 DIAGNOSIS — I5023 Acute on chronic systolic (congestive) heart failure: Secondary | ICD-10-CM | POA: Diagnosis present

## 2023-04-08 DIAGNOSIS — N401 Enlarged prostate with lower urinary tract symptoms: Secondary | ICD-10-CM | POA: Diagnosis present

## 2023-04-08 DIAGNOSIS — Z800 Family history of malignant neoplasm of digestive organs: Secondary | ICD-10-CM

## 2023-04-08 DIAGNOSIS — N138 Other obstructive and reflux uropathy: Secondary | ICD-10-CM | POA: Diagnosis present

## 2023-04-08 DIAGNOSIS — Z95810 Presence of automatic (implantable) cardiac defibrillator: Secondary | ICD-10-CM | POA: Diagnosis not present

## 2023-04-08 DIAGNOSIS — I442 Atrioventricular block, complete: Secondary | ICD-10-CM | POA: Diagnosis present

## 2023-04-08 DIAGNOSIS — I5020 Unspecified systolic (congestive) heart failure: Secondary | ICD-10-CM | POA: Diagnosis present

## 2023-04-08 DIAGNOSIS — I2510 Atherosclerotic heart disease of native coronary artery without angina pectoris: Secondary | ICD-10-CM | POA: Diagnosis present

## 2023-04-08 DIAGNOSIS — Z951 Presence of aortocoronary bypass graft: Secondary | ICD-10-CM | POA: Diagnosis not present

## 2023-04-08 DIAGNOSIS — I10 Essential (primary) hypertension: Secondary | ICD-10-CM | POA: Diagnosis not present

## 2023-04-08 DIAGNOSIS — M109 Gout, unspecified: Secondary | ICD-10-CM | POA: Diagnosis present

## 2023-04-08 DIAGNOSIS — Z886 Allergy status to analgesic agent status: Secondary | ICD-10-CM | POA: Diagnosis not present

## 2023-04-08 DIAGNOSIS — E7800 Pure hypercholesterolemia, unspecified: Secondary | ICD-10-CM

## 2023-04-08 DIAGNOSIS — I5022 Chronic systolic (congestive) heart failure: Secondary | ICD-10-CM

## 2023-04-08 DIAGNOSIS — Z888 Allergy status to other drugs, medicaments and biological substances status: Secondary | ICD-10-CM | POA: Diagnosis not present

## 2023-04-08 DIAGNOSIS — N184 Chronic kidney disease, stage 4 (severe): Secondary | ICD-10-CM | POA: Diagnosis present

## 2023-04-08 DIAGNOSIS — I480 Paroxysmal atrial fibrillation: Secondary | ICD-10-CM | POA: Diagnosis present

## 2023-04-08 DIAGNOSIS — Z1152 Encounter for screening for COVID-19: Secondary | ICD-10-CM | POA: Diagnosis not present

## 2023-04-08 DIAGNOSIS — I252 Old myocardial infarction: Secondary | ICD-10-CM

## 2023-04-08 DIAGNOSIS — Z79899 Other long term (current) drug therapy: Secondary | ICD-10-CM

## 2023-04-08 DIAGNOSIS — I5021 Acute systolic (congestive) heart failure: Secondary | ICD-10-CM

## 2023-04-08 DIAGNOSIS — Z7901 Long term (current) use of anticoagulants: Secondary | ICD-10-CM

## 2023-04-08 DIAGNOSIS — I4819 Other persistent atrial fibrillation: Secondary | ICD-10-CM | POA: Diagnosis present

## 2023-04-08 DIAGNOSIS — Z91013 Allergy to seafood: Secondary | ICD-10-CM | POA: Diagnosis not present

## 2023-04-08 DIAGNOSIS — Z955 Presence of coronary angioplasty implant and graft: Secondary | ICD-10-CM

## 2023-04-08 LAB — CBC
HCT: 40.2 % (ref 39.0–52.0)
Hemoglobin: 12.8 g/dL — ABNORMAL LOW (ref 13.0–17.0)
MCH: 28 pg (ref 26.0–34.0)
MCHC: 31.8 g/dL (ref 30.0–36.0)
MCV: 88 fL (ref 80.0–100.0)
Platelets: 209 10*3/uL (ref 150–400)
RBC: 4.57 MIL/uL (ref 4.22–5.81)
RDW: 14.7 % (ref 11.5–15.5)
WBC: 5.4 10*3/uL (ref 4.0–10.5)
nRBC: 0 % (ref 0.0–0.2)

## 2023-04-08 LAB — BASIC METABOLIC PANEL
Anion gap: 10 (ref 5–15)
BUN: 40 mg/dL — ABNORMAL HIGH (ref 8–23)
CO2: 26 mmol/L (ref 22–32)
Calcium: 9 mg/dL (ref 8.9–10.3)
Chloride: 99 mmol/L (ref 98–111)
Creatinine, Ser: 2.2 mg/dL — ABNORMAL HIGH (ref 0.61–1.24)
GFR, Estimated: 29 mL/min — ABNORMAL LOW (ref >60)
Glucose, Bld: 118 mg/dL — ABNORMAL HIGH (ref 70–99)
Potassium: 3.6 mmol/L (ref 3.5–5.1)
Sodium: 135 mmol/L (ref 135–145)

## 2023-04-08 LAB — RESP PANEL BY RT-PCR (RSV, FLU A&B, COVID)  RVPGX2
Influenza A by PCR: NEGATIVE
Influenza B by PCR: NEGATIVE
Resp Syncytial Virus by PCR: NEGATIVE
SARS Coronavirus 2 by RT PCR: NEGATIVE

## 2023-04-08 LAB — BRAIN NATRIURETIC PEPTIDE: B Natriuretic Peptide: >4500 pg/mL — ABNORMAL HIGH (ref 0.0–100.0)

## 2023-04-08 LAB — HEPATIC FUNCTION PANEL
ALT: 27 U/L (ref 0–44)
AST: 31 U/L (ref 15–41)
Albumin: 3.2 g/dL — ABNORMAL LOW (ref 3.5–5.0)
Alkaline Phosphatase: 170 U/L — ABNORMAL HIGH (ref 38–126)
Bilirubin, Direct: 0.6 mg/dL — ABNORMAL HIGH (ref 0.0–0.2)
Indirect Bilirubin: 1.2 mg/dL — ABNORMAL HIGH (ref 0.3–0.9)
Total Bilirubin: 1.8 mg/dL — ABNORMAL HIGH (ref <1.2)
Total Protein: 6.4 g/dL — ABNORMAL LOW (ref 6.5–8.1)

## 2023-04-08 LAB — PROTIME-INR
INR: 1.6 — ABNORMAL HIGH (ref 0.8–1.2)
Prothrombin Time: 18.8 s — ABNORMAL HIGH (ref 11.4–15.2)

## 2023-04-08 MED ORDER — OXYBUTYNIN CHLORIDE ER 10 MG PO TB24
10.0000 mg | ORAL_TABLET | Freq: Every day | ORAL | Status: DC
Start: 2023-04-09 — End: 2023-04-11
  Administered 2023-04-09 – 2023-04-11 (×3): 10 mg via ORAL
  Filled 2023-04-08 (×3): qty 1

## 2023-04-08 MED ORDER — SODIUM CHLORIDE 0.9 % IV SOLN
250.0000 mL | INTRAVENOUS | Status: AC | PRN
Start: 2023-04-08 — End: 2023-04-09

## 2023-04-08 MED ORDER — ACETAMINOPHEN 325 MG PO TABS
650.0000 mg | ORAL_TABLET | ORAL | Status: DC | PRN
Start: 2023-04-08 — End: 2023-04-11

## 2023-04-08 MED ORDER — CARVEDILOL 12.5 MG PO TABS
12.5000 mg | ORAL_TABLET | Freq: Two times a day (BID) | ORAL | Status: DC
  Administered 2023-04-09 – 2023-04-11 (×5): 12.5 mg via ORAL
  Filled 2023-04-08 (×5): qty 1

## 2023-04-08 MED ORDER — PERFLUTREN LIPID MICROSPHERE
1.0000 mL | INTRAVENOUS | Status: AC | PRN
  Administered 2023-04-08: 6 mL via INTRAVENOUS

## 2023-04-08 MED ORDER — HYDRALAZINE HCL 10 MG PO TABS
10.0000 mg | ORAL_TABLET | Freq: Three times a day (TID) | ORAL | Status: DC
  Administered 2023-04-08 – 2023-04-09 (×2): 10 mg via ORAL
  Filled 2023-04-08 (×2): qty 1

## 2023-04-08 MED ORDER — SODIUM CHLORIDE 0.9% FLUSH
3.0000 mL | Freq: Two times a day (BID) | INTRAVENOUS | Status: DC
  Administered 2023-04-08 – 2023-04-11 (×6): 3 mL via INTRAVENOUS

## 2023-04-08 MED ORDER — FINASTERIDE 5 MG PO TABS
5.0000 mg | ORAL_TABLET | Freq: Every day | ORAL | Status: DC
  Administered 2023-04-08 – 2023-04-11 (×4): 5 mg via ORAL
  Filled 2023-04-08 (×4): qty 1

## 2023-04-08 MED ORDER — FUROSEMIDE 10 MG/ML IJ SOLN
40.0000 mg | Freq: Two times a day (BID) | INTRAMUSCULAR | Status: DC
  Administered 2023-04-08: 40 mg via INTRAVENOUS
  Filled 2023-04-08: qty 4

## 2023-04-08 MED ORDER — TAMSULOSIN HCL 0.4 MG PO CAPS
0.4000 mg | ORAL_CAPSULE | Freq: Every day | ORAL | Status: DC
  Administered 2023-04-08 – 2023-04-11 (×4): 0.4 mg via ORAL
  Filled 2023-04-08 (×4): qty 1

## 2023-04-08 MED ORDER — FUROSEMIDE 10 MG/ML IJ SOLN
80.0000 mg | Freq: Two times a day (BID) | INTRAMUSCULAR | Status: DC
  Administered 2023-04-09 – 2023-04-11 (×5): 80 mg via INTRAVENOUS
  Filled 2023-04-08 (×5): qty 8

## 2023-04-08 MED ORDER — ONDANSETRON HCL 4 MG/2ML IJ SOLN: 4.0000 mg | Freq: Four times a day (QID) | INTRAMUSCULAR | Status: DC | PRN

## 2023-04-08 MED ORDER — FUROSEMIDE 10 MG/ML IJ SOLN
40.0000 mg | Freq: Once | INTRAMUSCULAR | Status: AC
Start: 2023-04-08 — End: 2023-04-08
  Administered 2023-04-08: 40 mg via INTRAVENOUS
  Filled 2023-04-08: qty 4

## 2023-04-08 MED ORDER — APIXABAN 2.5 MG PO TABS
2.5000 mg | ORAL_TABLET | Freq: Two times a day (BID) | ORAL | Status: DC
  Administered 2023-04-08 – 2023-04-11 (×6): 2.5 mg via ORAL
  Filled 2023-04-08 (×6): qty 1

## 2023-04-08 MED ORDER — SODIUM CHLORIDE 0.9% FLUSH: 3.0000 mL | INTRAVENOUS | Status: DC | PRN

## 2023-04-08 MED ORDER — COLCHICINE 0.6 MG PO TABS: 0.6000 mg | ORAL_TABLET | Freq: Every day | ORAL | Status: DC | PRN

## 2023-04-08 MED ORDER — ISOSORBIDE MONONITRATE ER 30 MG PO TB24
30.0000 mg | ORAL_TABLET | Freq: Every day | ORAL | Status: DC
  Administered 2023-04-09: 30 mg via ORAL
  Filled 2023-04-08: qty 1

## 2023-04-08 MED ORDER — EZETIMIBE 10 MG PO TABS
10.0000 mg | ORAL_TABLET | Freq: Every day | ORAL | Status: DC
Start: 2023-04-09 — End: 2023-04-11
  Administered 2023-04-09 – 2023-04-11 (×3): 10 mg via ORAL
  Filled 2023-04-08 (×3): qty 1

## 2023-04-08 NOTE — ED Notes (Signed)
ED TO INPATIENT HANDOFF REPORT  ED Nurse Name and Phone #: Sherilynn Dieu, MSN, RN, [place] [phone]  S Name/Age/Gender Dave Daniels 82 y.o. male Room/Bed: MH03/MH03  Code Status   Code Status: Prior  Home/SNF/Other Home Patient oriented to: self, place, time, and situation Is this baseline? Yes   Triage Complete: Triage complete  Chief Complaint Heart failure with reduced ejection fraction (HCC) [I50.20]  Triage Note The patient is having increased leg swelling and shortness of breath.    Allergies Allergies  Allergen Reactions   Statins Hives    Muscle pain & severe hives   Nsaids Other (See Comments)    Renal insufficiency     Tolmetin Other (See Comments)    Renal insufficiency   Atorvastatin Rash   Pravastatin Rash    rash   Shellfish Allergy Rash    Level of Care/Admitting Diagnosis ED Disposition     ED Disposition  Admit   Condition  --   Comment  Hospital [facility]: [facility] [[id number]]  Level of Care: Telemetry Cardiac [[id number]]  May admit patient to [facility] or [facility] if equivalent level of care is available:: No  Interfacility transfer: Yes  Covid Evaluation: Asymptomatic - no recent exposure (last 10 days) testing not required  Diagnosis: Heart failure with reduced ejection fraction [facility]) [[id number]]  Admitting Physician: Clydie Braun [[id number]]  Attending Physician: Clydie Braun [[id number]]  Certification:: I certify this patient will need inpatient services for at least 2 midnights  Expected Medical Readiness: 04/10/2023          B Medical/Surgery History Past Medical History:  Diagnosis Date   CAD (coronary artery disease)    MI in [place] with stenting in 2010, then MI with CABG in [place] 06/2010.  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   Complete heart block (HCC)    Gout    Hyperlipidemia    Hypertension    Ischemic cardiomyopathy    EF 15% by echo 11/2010 and still 20-25% by follow-up echo 02/2011, s/p St.  Jude Bi-V ICD implantation 04/01/11   LBBB (left bundle branch block)    Renal insufficiency    Cr 1.6 on 03/25/11   Past Surgical History:  Procedure Laterality Date   BI-VENTRICULAR IMPLANTABLE CARDIOVERTER DEFIBRILLATOR N/A 04/01/2011   Procedure: BI-VENTRICULAR IMPLANTABLE CARDIOVERTER DEFIBRILLATOR  (CRT-D);  Surgeon: Marinus Maw, MD;  Location: [facility] CATH LAB;  Daniels: Cardiovascular;  Laterality: N/A;   BIV ICD GENERATOR CHANGEOUT N/A 05/08/2022   Procedure: BIV ICD GENERATOR CHANGEOUT;  Surgeon: Regan Lemming, MD;  Location: [facility] INVASIVE CV LAB;  Daniels: Cardiovascular;  Laterality: N/A;   CARDIAC DEFIBRILLATOR PLACEMENT  12/12   BiV ICD (SJM) implanted by Dr Johney Frame   CARDIOVERSION N/A 04/22/2018   Procedure: CARDIOVERSION;  Surgeon: Quintella Reichert, MD;  Location: [facility] ENDOSCOPY;  Daniels: Cardiovascular;  Laterality: N/A;   CARDIOVERSION N/A 06/13/2019   Procedure: CARDIOVERSION;  Surgeon: Quintella Reichert, MD;  Location: [facility] ENDOSCOPY;  Daniels: Cardiovascular;  Laterality: N/A;   CARDIOVERSION N/A 02/11/2023   Procedure: CARDIOVERSION (CATH LAB);  Surgeon: Thomasene Ripple, DO;  Location: [facility] INVASIVE CV LAB;  Daniels: Cardiovascular;  Laterality: N/A;   Carpel tunnel surgery     CORONARY ARTERY BYPASS GRAFT  3/12   in [place]   EP IMPLANTABLE DEVICE N/A 08/19/2015   Procedure: BIV ICD Generator Changeout;  Surgeon: Hillis Range, MD;  Location: [facility] INVASIVE CV LAB;  Daniels: Cardiovascular;  Laterality: N/A;   INGUINAL HERNIA REPAIR  TONSILLECTOMY     UMBILICAL HERNIA REPAIR       A IV Location/Drains/Wounds Patient Lines/Drains/Airways Status     Active Line/Drains/Airways     Name Placement date Placement time Site Days   Peripheral IV 04/08/23 20 G Right Antecubital 04/08/23  1146  Antecubital  less than 1            Intake/Output Last 24 hours No intake or output data in the 24 hours ending 04/08/23 1432  Labs/Imaging Results for orders placed or performed  during the hospital encounter of 04/08/23 (from the past 48 hours)  Resp panel by RT-PCR (RSV, Flu A&B, Covid) Anterior Nasal Swab     Status: None   Collection Time: 04/08/23 11:22 AM   Specimen: Anterior Nasal Swab  Result Value Ref Range   SARS Coronavirus 2 by RT PCR NEGATIVE NEGATIVE    Comment: (NOTE) SARS-CoV-2 target nucleic acids are NOT DETECTED.  The SARS-CoV-2 RNA is generally detectable in upper respiratory specimens during the acute phase of infection. The lowest concentration of SARS-CoV-2 viral copies this assay can detect is 138 copies/mL. A negative result does not preclude SARS-Cov-2 infection and should not be used as the sole basis for treatment or other patient management decisions. A negative result may occur with  improper specimen collection/handling, submission of specimen other than nasopharyngeal swab, presence of viral mutation(s) within the areas targeted by this assay, and inadequate number of viral copies(<138 copies/mL). A negative result must be combined with clinical observations, patient history, and epidemiological information. The expected result is Negative.  Fact Sheet for Patients:  BloggerCourse.com  Fact Sheet for Healthcare Providers:  SeriousBroker.it  This test is no t yet approved or cleared by the [place] FDA and  has been authorized for detection and/or diagnosis of SARS-CoV-2 by FDA under an Emergency Use Authorization (EUA). This EUA will remain  in effect (meaning this test can be used) for the duration of the COVID-19 declaration under Section 564(b)(1) of the Act, 21 U.S.C.section 360bbb-3(b)(1), unless the authorization is terminated  or revoked sooner.       Influenza A by PCR NEGATIVE NEGATIVE   Influenza B by PCR NEGATIVE NEGATIVE    Comment: (NOTE) The Xpert Xpress SARS-CoV-2/FLU/RSV plus assay is intended as an aid in the diagnosis of influenza from  Nasopharyngeal swab specimens and should not be used as a sole basis for treatment. Nasal washings and aspirates are unacceptable for Xpert Xpress SARS-CoV-2/FLU/RSV testing.  Fact Sheet for Patients: BloggerCourse.com  Fact Sheet for Healthcare Providers: SeriousBroker.it  This test is not yet approved or cleared by the [place] FDA and has been authorized for detection and/or diagnosis of SARS-CoV-2 by FDA under an Emergency Use Authorization (EUA). This EUA will remain in effect (meaning this test can be used) for the duration of the COVID-19 declaration under Section 564(b)(1) of the Act, 21 U.S.C. section 360bbb-3(b)(1), unless the authorization is terminated or revoked.     Resp Syncytial Virus by PCR NEGATIVE NEGATIVE    Comment: (NOTE) Fact Sheet for Patients: BloggerCourse.com  Fact Sheet for Healthcare Providers: SeriousBroker.it  This test is not yet approved or cleared by the [place] FDA and has been authorized for detection and/or diagnosis of SARS-CoV-2 by FDA under an Emergency Use Authorization (EUA). This EUA will remain in effect (meaning this test can be used) for the duration of the COVID-19 declaration under Section 564(b)(1) of the Act, 21 U.S.C. section 360bbb-3(b)(1), unless the authorization is terminated or  revoked.  Performed at [facility], [address] [place], [place] [address]   Basic metabolic panel     Status: Abnormal   Collection Time: 04/08/23 11:38 AM  Result Value Ref Range   Sodium 135 135 - 145 mmol/L   Potassium 3.6 3.5 - 5.1 mmol/L   Chloride 99 98 - 111 mmol/L   CO2 26 22 - 32 mmol/L   Glucose, Bld 118 (H) 70 - 99 mg/dL    Comment: Glucose reference range applies only to samples taken after fasting for at least 8 hours.   BUN 40 (H) 8 - 23 mg/dL   Creatinine, Ser 7.82 (H) 0.61 - 1.24 mg/dL   Calcium 9.0  8.9 - 95.6 mg/dL   GFR, Estimated 29 (L) >60 mL/min    Comment: (NOTE) Calculated using the CKD-EPI Creatinine Equation (2021)    Anion gap 10 5 - 15    Comment: Performed at [facility], [address] [place], [place] [address]  CBC     Status: Abnormal   Collection Time: 04/08/23 11:38 AM  Result Value Ref Range   WBC 5.4 4.0 - 10.5 K/uL   RBC 4.57 4.22 - 5.81 MIL/uL   Hemoglobin 12.8 (L) 13.0 - 17.0 g/dL   HCT 65.7 84.6 - 96.2 %   MCV 88.0 80.0 - 100.0 fL   MCH 28.0 26.0 - 34.0 pg   MCHC 31.8 30.0 - 36.0 g/dL   RDW 95.2 84.1 - 32.4 %   Platelets 209 150 - 400 K/uL   nRBC 0.0 0.0 - 0.2 %    Comment: Performed at [facility], [address] [place], [place] [address]  Protime-INR (order if Patient is taking Coumadin / Warfarin)     Status: Abnormal   Collection Time: 04/08/23 11:38 AM  Result Value Ref Range   Prothrombin Time 18.8 (H) 11.4 - 15.2 seconds   INR 1.6 (H) 0.8 - 1.2    Comment: (NOTE) INR goal varies based on device and disease states. Performed at [facility], [address] [place], [place] [address]   Brain natriuretic peptide     Status: Abnormal   Collection Time: 04/08/23 11:38 AM  Result Value Ref Range   B Natriuretic Peptide >4,500.0 (H) 0.0 - 100.0 pg/mL    Comment: Performed at [facility], [address] [place], [place] [address]  Hepatic function panel     Status: Abnormal   Collection Time: 04/08/23 12:02 PM  Result Value Ref Range   Total Protein 6.4 (L) 6.5 - 8.1 g/dL   Albumin 3.2 (L) 3.5 - 5.0 g/dL   AST 31 15 - 41 U/L   ALT 27 0 - 44 U/L   Alkaline Phosphatase 170 (H) 38 - 126 U/L   Total Bilirubin 1.8 (H) <1.2 mg/dL   Bilirubin, Direct 0.6 (H) 0.0 - 0.2 mg/dL   Indirect Bilirubin 1.2 (H) 0.3 - 0.9 mg/dL    Comment: Performed at [facility], [address] [place], [place] [address]   DG Chest 2 View Result Date: 04/08/2023 CLINICAL DATA:  Shortness of breath.  Bilateral lower extremity swelling. EXAM: CHEST - 2 VIEW COMPARISON:  Chest radiograph dated March 30, 2023. FINDINGS: Stable cardiomegaly with pulmonary vascular congestion. Stable left-sided pacemaker in place. Prior median sternotomy. Increased size of a small right pleural effusion with increased patchy opacity at the right lung base. No pneumothorax.  No acute osseous abnormality. IMPRESSION: 1. Increased size of a small right pleural effusion and patchy opacity at the right lung base, which could represent atelectasis or infiltrate. 2. Cardiomegaly with pulmonary vascular congestion. Electronically Signed   By: Hart Robinsons M.D.   On: 04/08/2023 12:18    Pending Labs Unresulted Labs (From admission, onward)    None       Vitals/Pain Today's Vitals   04/08/23 1117 04/08/23 1118 04/08/23 1133  BP:  134/83   Pulse:  (!) 54   Resp:  18   Temp:  (!) 97.2 F (36.2 C)   SpO2:  100%   Weight: 70 kg    Height: 5\' 7"  (1.702 m)    PainSc: 3   8     Isolation Precautions No active isolations  Medications Medications  furosemide (LASIX) injection 40 mg (40 mg Intravenous Given 04/08/23 1218)    Mobility walks     Focused Assessments Cardiac Assessment Handoff:  Cardiac Rhythm: A-V Sequential paced No results found for: "CKTOTAL", "CKMB", "CKMBINDEX", "TROPONINI" No results found for: "DDIMER" Does the Patient currently have chest pain? No    R Recommendations: See Admitting Provider Note  Report given to: Calynn. RN  Additional Notes:

## 2023-04-08 NOTE — Plan of Care (Signed)

## 2023-04-08 NOTE — ED Notes (Signed)
RT Note:  Patient oxygen saturation on room air while at rest =98%, HR 67, RR 16 Patient oxygen saturation on room air while ambulating + 97%, HR 79, RR18  Patient tolerated well

## 2023-04-08 NOTE — H&P (Signed)
History and Physical    Patient: Dave Daniels UJW:[id number] DOB: 1940/12/21 DOA: 04/08/2023 DOS: the patient was seen and examined on 04/08/2023 PCP: Cheron Schaumann., MD  Patient coming from: Home  Chief Complaint:  Chief Complaint  Patient presents with   Leg Swelling   Shortness of Breath   HPI: Dave Daniels is a 82 y.o. male with medical history significant of HTN, HLD, atrial fibrillation/flutter, CAD s/p PCI and CABG, HFrEF, s/p ICD CKD stage IV who presents with progressively worsening shortness of breath over the last 2 weeks.  During this time he has had lower extremity edema extending from the feet to the knees. The patient reports associated shortness of breath and fatigue, particularly noticeable during even minor physical exertion, necessitating frequent rest periods. There is no associated chest discomfort. The patient occasionally experiences discomfort when lying flat, requiring him to sit up for relief.  The patient's weight has been fluctuating between 153 and 161 pounds, with the most recent weight recorded at home being 154 pounds. Despite an increase in Lasix dosage following a recent emergency department visit, the patient reports no significant improvement in symptoms.The patient denies any cough or abdominal distention, but notes persistent lower extremity swelling. The patient is under the care of Dr. Maryclare Bean of nephrology.  Records note patient had underwent successful course ablation of atrial fibrillation on 10/31 and prior to that underwent generator change out with Dr. Dr. Elberta Fortis for his ICD on 1/26.  In the emergency department patient was noted to be afebrile with pulse 54-68, and all other vital signs maintained.  Labs notable for BNP elevated greater than 4500.  Chest x-ray noted increased size and pleural effusion,  patchy opacities in the right lung base, and cardiomegaly with pulmonary vascular congestion.  Last echocardiogram noted EF to be  20 to 25% with grade 3 diastolic dysfunction back in 2023.Patient had been given Lasix 40 mg IV x 1 dose. Accepted as inpatient to a cardiac telemetry bed.   Review of Systems: As mentioned in the history of present illness. All other systems reviewed and are negative. Past Medical History:  Diagnosis Date   CAD (coronary artery disease)    MI in [place] with stenting in 2010, then MI with CABG in [place] 06/2010.  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   Complete heart block (HCC)    Gout    Hyperlipidemia    Hypertension    Ischemic cardiomyopathy    EF 15% by echo 11/2010 and still 20-25% by follow-up echo 02/2011, s/p St. Jude Bi-V ICD implantation 04/01/11   LBBB (left bundle branch block)    Renal insufficiency    Cr 1.6 on 03/25/11   Past Surgical History:  Procedure Laterality Date   BI-VENTRICULAR IMPLANTABLE CARDIOVERTER DEFIBRILLATOR N/A 04/01/2011   Procedure: BI-VENTRICULAR IMPLANTABLE CARDIOVERTER DEFIBRILLATOR  (CRT-D);  Surgeon: Marinus Maw, MD;  Location: [facility] CATH LAB;  Service: Cardiovascular;  Laterality: N/A;   BIV ICD GENERATOR CHANGEOUT N/A 05/08/2022   Procedure: BIV ICD GENERATOR CHANGEOUT;  Surgeon: Regan Lemming, MD;  Location: [facility] INVASIVE CV LAB;  Service: Cardiovascular;  Laterality: N/A;   CARDIAC DEFIBRILLATOR PLACEMENT  12/12   BiV ICD (SJM) implanted by Dr Johney Frame   CARDIOVERSION N/A 04/22/2018   Procedure: CARDIOVERSION;  Surgeon: Quintella Reichert, MD;  Location: [facility] ENDOSCOPY;  Service: Cardiovascular;  Laterality: N/A;   CARDIOVERSION N/A 06/13/2019   Procedure: CARDIOVERSION;  Surgeon: Quintella Reichert, MD;  Location: [facility] ENDOSCOPY;  Service: Cardiovascular;  Laterality: N/A;   CARDIOVERSION N/A 02/11/2023   Procedure: CARDIOVERSION (CATH LAB);  Surgeon: Thomasene Ripple, DO;  Location: [facility] INVASIVE CV LAB;  Service: Cardiovascular;  Laterality: N/A;   Carpel tunnel surgery     CORONARY ARTERY BYPASS GRAFT  3/12   in [place]   EP IMPLANTABLE DEVICE N/A 08/19/2015    Procedure: BIV ICD Generator Changeout;  Surgeon: Hillis Range, MD;  Location: [facility] INVASIVE CV LAB;  Service: Cardiovascular;  Laterality: N/A;   INGUINAL HERNIA REPAIR     TONSILLECTOMY     UMBILICAL HERNIA REPAIR     Social History:  reports that he has never smoked. He has never used smokeless tobacco. He reports current alcohol use of about 2.0 standard drinks of alcohol per week. He reports that he does not use drugs.  Allergies  Allergen Reactions   Statins Hives    Muscle pain & severe hives   Nsaids Other (See Comments)    Renal insufficiency     Tolmetin Other (See Comments)    Renal insufficiency   Atorvastatin Rash   Pravastatin Rash    rash   Shellfish Allergy Rash    Family History  Problem Relation Age of Onset   Pancreatic cancer Mother     Prior to Admission medications   Medication Sig Start Date End Date Taking? Authorizing Provider  acetaminophen (TYLENOL) 500 MG tablet Take 1,000 mg by mouth as needed for moderate pain or headache.     [provider]  allopurinol (ZYLOPRIM) 300 MG tablet Take 300 mg by mouth daily as needed (Gout). 12/31/17   [provider]  apixaban (ELIQUIS) 2.5 MG TABS tablet Take 1 tablet (2.5 mg total) by mouth 2 (two) times daily. 02/25/23   Eustace Pen, PA-C  carvedilol (COREG) 12.5 MG tablet TAKE 1 TABLET BY MOUTH TWICE DAILY WITH A MEAL 03/25/23   Lewayne Bunting, MD  cholecalciferol (VITAMIN D3) 25 MCG (1000 UNIT) tablet Take 1,000 Units by mouth daily.    [provider]  colchicine 0.6 MG tablet Take 0.6 mg by mouth as needed (Gout).    [provider]  Evolocumab (REPATHA SURECLICK) 140 MG/ML SOAJ Inject 140 mg into the skin every 14 (fourteen) days. 12/09/22   Lewayne Bunting, MD  ezetimibe (ZETIA) 10 MG tablet Take 1 tablet (10 mg total) by mouth daily. 12/09/22   Lewayne Bunting, MD  finasteride (PROSCAR) 5 MG tablet Take 5 mg by mouth daily.    [provider]  furosemide  (LASIX) 20 MG tablet Take 2 tablets by mouth once daily Patient taking differently: Take 20 mg by mouth daily. Additional 20 mg if needed for weight gain 02/02/23   Lewayne Bunting, MD  hydrALAZINE (APRESOLINE) 10 MG tablet TAKE 1 TABLET BY MOUTH THREE TIMES DAILY 01/19/23   Lewayne Bunting, MD  isosorbide mononitrate (IMDUR) 30 MG 24 hr tablet Take 1 tablet by mouth once daily 09/08/22   Camnitz, Andree Coss, MD  oxybutynin (DITROPAN-XL) 10 MG 24 hr tablet Take 10 mg by mouth daily.    [provider]  tamsulosin (FLOMAX) 0.4 MG CAPS capsule Take 0.4 mg by mouth daily. 02/03/22   [provider]    Physical Exam: Vitals:   04/08/23 1118 04/08/23 1130 04/08/23 1209 04/08/23 1540  BP: 134/83 133/89 124/77 (!) 142/82  Pulse: (!) 54 68 65 68  Resp: 18 20 18 18   Temp: (!) 97.2 F (36.2 C)   97.6 F (36.4  C)  TempSrc:    Oral  SpO2: 100% 100% 99% 99%  Weight:    71.8 kg  Height:    5\' 7"  (1.702 m)   Constitutional: Elderly male currently in no acute distress sitting up Eyes: PERRL, lids and conjunctivae normal ENMT: Mucous membranes are moist. Posterior pharynx clear of any exudate or lesions.Normal dentition.  Neck: normal, supple, no masses, no thyromegaly Respiratory: clear to auscultation bilaterally, no wheezing, no crackles. Normal respiratory effort. No accessory muscle use.  Cardiovascular: Regular rate and rhythm, no murmurs / rubs / gallops. No extremity edema. 2+ pedal pulses. No carotid bruits.  Abdomen: no tenderness, no masses palpated. No hepatosplenomegaly. Bowel sounds positive.  Musculoskeletal: no clubbing / cyanosis. No joint deformity upper and lower extremities. Good ROM, no contractures. Normal muscle tone.  Skin: no rashes, lesions, ulcers. No induration Neurologic: CN 2-12 grossly intact. Sensation intact, DTR normal. Strength 5/5 in all 4.  Psychiatric: Normal judgment and insight. Alert and oriented x 3. Normal mood.   Data  Reviewed:  Patient in atrial sensed and ventricularly paced rhythm at 68 bpm.  Reviewed labs, imaging, and pertinent records as documented.  Assessment and Plan: Heart failure with reduced ejection fraction Patient presents with complaints of progressively worsening shortness of breath and lower extremity edema despite increasing dose of Lasix from 20 mg twice daily to 40 mg twice daily on 12/17.  On physical exam patient with at least 3+ pitting bilateral lower extremity edema and JVD to the angle of the jaw.  Labs significant for BNP greater than 4500.  Last echocardiogram noted EF to be 20 to 25% with grade 3 diastolic dysfunction on last check 09/2021. -Admit to a cardiac telemetry bed -Heart failure order set utilized -Strict I&O's and daily weights -Elevate legs -Check echocardiogram -Lasix 80 mg IV twice daily -Dr. Wyline Mood of cardiology consulted, and they will see in a.m.  Paroxysmal atrial fibrillation on chronic anticoagulation -Continue Eliquis  Essential hypertension Blood pressures are currently maintained. -Continue Coreg, hydralazine, isosorbide mononitrate  S/p AICD Patient had his AICD generator changed out on 05/08/2022 by Dr. Elberta Fortis.  Hyperlipidemia Patient is on Repatha and Zetia -Continue Zetia -Plan to resume Repatha in outpatient setting  Chronic kidney disease stage IV On admission creatinine 2.2 with BUN 40.  Baseline creatinine 2.2-2.8. -Continue to monitor kidney function with diuresis  BPH -Continue finasteride and tamsulosin  DVT prophylaxis: Eliquis Advance Care Planning:   Code Status: Full Code   Consults: Cardiology  Family Communication: Wife updated at bedside  Severity of Illness: The appropriate patient status for this patient is INPATIENT. Inpatient status is judged to be reasonable and necessary in order to provide the required intensity of service to ensure the patient's safety. The patient's presenting symptoms, physical exam  findings, and initial radiographic and laboratory data in the context of their chronic comorbidities is felt to place them at high risk for further clinical deterioration. Furthermore, it is not anticipated that the patient will be medically stable for discharge from the hospital within 2 midnights of admission.   * I certify that at the point of admission it is my clinical judgment that the patient will require inpatient hospital care spanning beyond 2 midnights from the point of admission due to high intensity of service, high risk for further deterioration and high frequency of surveillance required.*  Author: Clydie Braun, MD 04/08/2023 4:07 PM  For on call review [email or web address].

## 2023-04-08 NOTE — Plan of Care (Signed)
Dave Daniels 82 year old male with pmh HTN, HLD, atrial fibrillation/flutter, ischemic cardiomyopathy, CAD s/p PCI and CABG, complete heart block, CKD stage IV who presents with progressively worsening shortness of breath.  Seen in the ED on 12/17 with Lasix increased from 20 to 40 mg twice daily without improvement.  Labs notable for BNP elevated greater than 4500.  Chest x-ray noted increased size and pleural effusion,  patchy opacities in the right lung base, and cardiomegaly with pulmonary vascular congestion.  Last echocardiogram noted EF to be 20 to 25% with grade 3 diastolic dysfunction back in 2023.Patient had been given Lasix 40 mg IV x 1 dose accepted as inpatient to a cardiac telemetry bed.

## 2023-04-08 NOTE — Telephone Encounter (Signed)
Patient identification verified by 2 forms. Marilynn Rail, RN    Called and spoke to patient  Patient states:   -Dr. Jens Som advised him to restart Jardiance 10mg  at 9/5 appointment   -does not recall Rx ever being discontinued  Informed patient message sent for clarification  Patient verbalized understanding, no questions at this time

## 2023-04-08 NOTE — Telephone Encounter (Signed)
Spoke with pt, he reports he is going to the ER. He reports swelling of his feet and legs, weakness and anxious. Advised the patient per dr Jens Som, if he can not afford the jardiance, he does not need to take it. He will let me know if he needs anything once he is seen in the ER.

## 2023-04-08 NOTE — Telephone Encounter (Signed)
Pt c/o medication issue:  1. Name of Medication:   empagliflozin (JARDIANCE) 10 MG TABS tablet   2. How are you currently taking this medication (dosage and times per day)?   3. Are you having a reaction (difficulty breathing--STAT)?   4. What is your medication issue?   Patient stated he was recently restarted on this medication and wants prescription to [facility] [address] [place], [place] - [address]

## 2023-04-08 NOTE — Progress Notes (Signed)
Echocardiogram 2D Echocardiogram has been performed.  Dave Daniels 04/08/2023, 5:57 PM

## 2023-04-08 NOTE — ED Notes (Signed)
[facility] at bedside to assume care of patient. 

## 2023-04-08 NOTE — ED Notes (Signed)
ED Provider at bedside. 

## 2023-04-08 NOTE — ED Provider Notes (Signed)
[facility] Provider Note   CSN: [id number] Arrival date & time: 04/08/23  1111     History  Chief Complaint  Patient presents with   Leg Swelling   Shortness of Breath    Dave Daniels is a 82 y.o. male.   Shortness of Breath 82 year old male history of CAD status post CABG, complete heart block status post pacemaker, hypertension, hyperlipidemia, ischemic cardiomyopathy present for shortness of breath and leg swelling.  Patient states for 2 weeks has had dyspnea on exertion, orthopnea, and lower extremity edema bilaterally.  He was seen in the ED on the 17th.  At that time he had cardiomegaly.  His Lasix was increased from 20 mg twice daily to 40 mg twice daily which she has been taking.  Plan for cardiology follow-up.  He has not yet followed up with cardiology.  However symptoms are persistent so he presents for evaluation.  He has not had any chest pain.  No dizziness or syncope.  He is otherwise been at his baseline health.     Home Medications Prior to Admission medications   Medication Sig Start Date End Date Taking? Authorizing Provider  acetaminophen (TYLENOL) 500 MG tablet Take 1,000 mg by mouth as needed for moderate pain or headache.     [provider]  allopurinol (ZYLOPRIM) 300 MG tablet Take 300 mg by mouth daily as needed (Gout). 12/31/17   [provider]  apixaban (ELIQUIS) 2.5 MG TABS tablet Take 1 tablet (2.5 mg total) by mouth 2 (two) times daily. 02/25/23   Eustace Pen, PA-C  carvedilol (COREG) 12.5 MG tablet TAKE 1 TABLET BY MOUTH TWICE DAILY WITH A MEAL 03/25/23   Lewayne Bunting, MD  cholecalciferol (VITAMIN D3) 25 MCG (1000 UNIT) tablet Take 1,000 Units by mouth daily.    [provider]  colchicine 0.6 MG tablet Take 0.6 mg by mouth as needed (Gout).    [provider]  Evolocumab (REPATHA SURECLICK) 140 MG/ML SOAJ Inject 140 mg into the skin every 14 (fourteen)  days. 12/09/22   Lewayne Bunting, MD  ezetimibe (ZETIA) 10 MG tablet Take 1 tablet (10 mg total) by mouth daily. 12/09/22   Lewayne Bunting, MD  finasteride (PROSCAR) 5 MG tablet Take 5 mg by mouth daily.    [provider]  furosemide (LASIX) 20 MG tablet Take 2 tablets by mouth once daily Patient taking differently: Take 20 mg by mouth daily. Additional 20 mg if needed for weight gain 02/02/23   Lewayne Bunting, MD  hydrALAZINE (APRESOLINE) 10 MG tablet TAKE 1 TABLET BY MOUTH THREE TIMES DAILY 01/19/23   Lewayne Bunting, MD  isosorbide mononitrate (IMDUR) 30 MG 24 hr tablet Take 1 tablet by mouth once daily 09/08/22   Camnitz, Andree Coss, MD  oxybutynin (DITROPAN-XL) 10 MG 24 hr tablet Take 10 mg by mouth daily.    [provider]  tamsulosin (FLOMAX) 0.4 MG CAPS capsule Take 0.4 mg by mouth daily. 02/03/22   [provider]      Allergies    Statins, Nsaids, Tolmetin, Atorvastatin, Pravastatin, and Shellfish allergy    Review of Systems   Review of Systems  Respiratory:  Positive for shortness of breath.   Review of systems completed and notable as per HPI.  ROS otherwise negative.  Physical Exam Updated Vital Signs BP 124/77 (BP Location: Right Arm)   Pulse 65   Temp (!) 97.2 F (36.2 C)  Resp 18   Ht 5\' 7"  (1.702 m)   Wt 70 kg   SpO2 99%   BMI 24.17 kg/m  Physical Exam Vitals and nursing note reviewed.  Constitutional:      General: He is not in acute distress.    Appearance: He is well-developed.  HENT:     Head: Normocephalic and atraumatic.  Eyes:     Conjunctiva/sclera: Conjunctivae normal.  Cardiovascular:     Rate and Rhythm: Normal rate and regular rhythm.     Pulses: Normal pulses.     Heart sounds: Normal heart sounds. No murmur heard. Pulmonary:     Effort: Pulmonary effort is normal. No respiratory distress.     Breath sounds: Normal breath sounds.  Abdominal:     Palpations: Abdomen is soft.     Tenderness: There is no  abdominal tenderness.  Musculoskeletal:        General: No swelling.     Cervical back: Neck supple.     Right lower leg: Edema present.     Left lower leg: Edema present.     Comments: 3+ pitting edema to the knees bilaterally.  No wounds or erythema.  Skin:    General: Skin is warm and dry.     Capillary Refill: Capillary refill takes less than 2 seconds.  Neurological:     Mental Status: He is alert.  Psychiatric:        Mood and Affect: Mood normal.     ED Results / Procedures / Treatments   Labs (all labs ordered are listed, but only abnormal results are displayed) Labs Reviewed  BASIC METABOLIC PANEL - Abnormal; Notable for the following components:      Result Value   Glucose, Bld 118 (*)    BUN 40 (*)    Creatinine, Ser 2.20 (*)    GFR, Estimated 29 (*)    All other components within normal limits  CBC - Abnormal; Notable for the following components:   Hemoglobin 12.8 (*)    All other components within normal limits  PROTIME-INR - Abnormal; Notable for the following components:   Prothrombin Time 18.8 (*)    INR 1.6 (*)    All other components within normal limits  BRAIN NATRIURETIC PEPTIDE - Abnormal; Notable for the following components:   B Natriuretic Peptide >4,500.0 (*)    All other components within normal limits  HEPATIC FUNCTION PANEL - Abnormal; Notable for the following components:   Total Protein 6.4 (*)    Albumin 3.2 (*)    Alkaline Phosphatase 170 (*)    Total Bilirubin 1.8 (*)    Bilirubin, Direct 0.6 (*)    Indirect Bilirubin 1.2 (*)    All other components within normal limits  RESP PANEL BY RT-PCR (RSV, FLU A&B, COVID)  [id number]    EKG EKG Interpretation Date/Time:  Thursday April 08 2023 11:20:55 EST Ventricular Rate:  68 PR Interval:  208 QRS Duration:  183 QT Interval:  487 QTC Calculation: 518 R Axis:   -81  Text Interpretation: Atrial-sensed ventricular-paced rhythm Paired ventricular premature complexes Nonspecific IVCD  with LAD Borderline T abnormalities, lateral leads Overall similar to prior EKG Confirmed by Fulton Reek [phone]) on 04/08/2023 11:24:14 AM  Radiology DG Chest 2 View Result Date: 04/08/2023 CLINICAL DATA:  Shortness of breath. Bilateral lower extremity swelling. EXAM: CHEST - 2 VIEW COMPARISON:  Chest radiograph dated March 30, 2023. FINDINGS: Stable cardiomegaly with pulmonary vascular congestion. Stable left-sided pacemaker in place. Prior median sternotomy.  Increased size of a small right pleural effusion with increased patchy opacity at the right lung base. No pneumothorax. No acute osseous abnormality. IMPRESSION: 1. Increased size of a small right pleural effusion and patchy opacity at the right lung base, which could represent atelectasis or infiltrate. 2. Cardiomegaly with pulmonary vascular congestion. Electronically Signed   By: Hart Robinsons M.D.   On: 04/08/2023 12:18    Procedures Procedures    Medications Ordered in ED Medications  furosemide (LASIX) injection 40 mg (40 mg Intravenous Given 04/08/23 1218)    ED Course/ Medical Decision Making/ A&P Clinical Course as of 04/08/23 1534  Thu Apr 08, 2023  1347 Discussed with hospitalist [JD]    Clinical Course User Index [JD] Laurence Spates, MD                                 Medical Decision Making Amount and/or Complexity of Data Reviewed Labs: ordered. Radiology: ordered.  Risk Prescription drug management. Decision regarding hospitalization.   Medical Decision Making:   RANJIT GANEY is a 82 y.o. male who presented to the ED today with shortness of breath, orthopnea, DOE, leg swelling.  Vital signs reviewed.  EKG shows paced rhythm similar to prior.  He had no chest pain I lower concern for ACS.  I suspect he has heart failure exacerbation.  He was seen here just over a week ago for the same and had his Lasix dose increased but is still having worsening symptoms.  He had about 6 pound weight gain  over the last month he states.  I have lower concern for PE given clear heart failure exacerbation clinically.  Will repeat lab work, given Lasix here and reevaluate.   Patient placed on continuous vitals and telemetry monitoring while in ED which was reviewed periodically.  Reviewed and confirmed nursing documentation for past medical history, family history, social history.  Reassessment and Plan:   BNP is markedly elevated and significantly increased from most recent value.  Renal functions at baseline.  Chest x-ray shows some cardiomegaly and vascular congestion consistent with heart failure exacerbation.  I think he needs admission for IV diuresis given he has already had failure of outpatient management with increased oral diuresis.  Discussed with Dr. Katrinka Blazing hospitalist and admitted.   Patient's presentation is most consistent with acute complicated illness / injury requiring diagnostic workup.           Final Clinical Impression(s) / ED Diagnoses Final diagnoses:  Acute on chronic systolic congestive heart failure [facility])    Rx / DC Orders ED Discharge Orders     None         Laurence Spates, MD 04/08/23 1534

## 2023-04-08 NOTE — ED Triage Notes (Signed)
The patient is having increased leg swelling and shortness of breath.

## 2023-04-09 ENCOUNTER — Other Ambulatory Visit: Payer: Self-pay | Admitting: Physician Assistant

## 2023-04-09 DIAGNOSIS — I5020 Unspecified systolic (congestive) heart failure: Secondary | ICD-10-CM | POA: Diagnosis not present

## 2023-04-09 LAB — BASIC METABOLIC PANEL
Anion gap: 14 (ref 5–15)
BUN: 38 mg/dL — ABNORMAL HIGH (ref 8–23)
CO2: 24 mmol/L (ref 22–32)
Calcium: 9.1 mg/dL (ref 8.9–10.3)
Chloride: 100 mmol/L (ref 98–111)
Creatinine, Ser: 2.29 mg/dL — ABNORMAL HIGH (ref 0.61–1.24)
GFR, Estimated: 28 mL/min — ABNORMAL LOW (ref >60)
Glucose, Bld: 92 mg/dL (ref 70–99)
Potassium: 3.5 mmol/L (ref 3.5–5.1)
Sodium: 138 mmol/L (ref 135–145)

## 2023-04-09 LAB — ECHOCARDIOGRAM COMPLETE
AR max vel: 2.49 cm2
AV Peak grad: 5.8 mm[Hg]
Ao pk vel: 1.21 m/s
Area-P 1/2: 3.99 cm2
Height: 67 in
MV M vel: 3.22 m/s
MV Peak grad: 41.4 mm[Hg]
P 1/2 time: 503 ms
S' Lateral: 5.6 cm
Weight: 2532.64 [oz_av]

## 2023-04-09 MED ORDER — POTASSIUM CHLORIDE CRYS ER 20 MEQ PO TBCR
20.0000 meq | EXTENDED_RELEASE_TABLET | Freq: Once | ORAL | Status: AC
  Administered 2023-04-09: 20 meq via ORAL
  Filled 2023-04-09: qty 1

## 2023-04-09 MED ORDER — HYDRALAZINE HCL 10 MG PO TABS
20.0000 mg | ORAL_TABLET | Freq: Three times a day (TID) | ORAL | Status: DC
  Administered 2023-04-09 – 2023-04-11 (×6): 20 mg via ORAL
  Filled 2023-04-09 (×6): qty 2

## 2023-04-09 MED ORDER — ISOSORBIDE MONONITRATE ER 60 MG PO TB24
60.0000 mg | ORAL_TABLET | Freq: Every day | ORAL | Status: DC
  Administered 2023-04-10 – 2023-04-11 (×2): 60 mg via ORAL
  Filled 2023-04-09 (×2): qty 1

## 2023-04-09 NOTE — TOC Initial Note (Signed)
Transition of Care [facility]) - Initial/Assessment Note    Patient Details  Name: Dave Daniels MRN: [id number] Date of Birth: [birth year]  [facility] CM/SW Contact:    Kermit Balo, RN Phone Number: 04/09/2023, 12:12 PM  Clinical Narrative:                  Pt is from home with his spouse. He has needed supervision at home. No DME. Denies issues with transportation or home medications. TOC following.   Expected Discharge Plan: Home/Self Care Barriers to Discharge: Continued Medical Work up   Patient Goals and CMS Choice            Expected Discharge Plan and Services       Living arrangements for the past 2 months: Single Family Home                                      Prior Living Arrangements/Services Living arrangements for the past 2 months: Single Family Home Lives with:: Spouse Patient language and need for interpreter reviewed:: Yes Do you feel safe going back to the place where you live?: Yes        Care giver support system in place?: Yes (comment)   Criminal Activity/Legal Involvement Pertinent to Current Situation/Hospitalization: No - Comment as needed  Activities of Daily Living   ADL Screening (condition at time of admission) Independently performs ADLs?: Yes (appropriate for developmental age) Is the patient deaf or have difficulty hearing?: No Does the patient have difficulty seeing, even when wearing glasses/contacts?: No Does the patient have difficulty concentrating, remembering, or making decisions?: No  Permission Sought/Granted                  Emotional Assessment Appearance:: Appears stated age Attitude/Demeanor/Rapport: Engaged Affect (typically observed): Accepting Orientation: : Oriented to Self, Oriented to Place, Oriented to  Time, Oriented to Situation   Psych Involvement: No (comment)  Admission diagnosis:  Acute on chronic systolic congestive heart failure (HCC) [I50.23] Heart failure with  reduced ejection fraction (HCC) [I50.20] Patient Active Problem List   Diagnosis Date Noted   Heart failure with reduced ejection fraction (HCC) 04/08/2023   Paroxysmal atrial fibrillation (HCC) 04/08/2023   CKD (chronic kidney disease), stage IV (HCC) 04/08/2023   Hearing loss due to cerumen impaction, bilateral 09/25/2021   Fall 06/10/2020   Chronic anticoagulation 12/07/2019   Persistent atrial fibrillation (HCC)    Microscopic hematuria 01/31/2019   Atrial flutter (HCC)    Other persistent atrial fibrillation (HCC) 03/23/2018   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   Weight loss, unintentional 11/24/2017   Chronic pain of both knees 07/22/2017   Thrombocytopenia (HCC) 03/05/2015   Elevated PSA, greater than or equal to 20 ng/ml 01/03/2015   Refused influenza vaccine 01/02/2015   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 [place], [place] - [address] [place] [address] Phone: [phone] Fax: [phone]  [facility] [address] [place] [address] Phone: [phone] Fax: [phone]     Social Drivers of Health (SDOH) Social History: SDOH Screenings   Food Insecurity: No Food Insecurity (04/08/2023)  Housing: Low Risk  (04/08/2023)  Transportation Needs: No Transportation Needs (04/08/2023)  Utilities: Not At Risk (04/08/2023)  Tobacco Use: Low Risk  (04/08/2023)   SDOH Interventions:      Readmission Risk Interventions     No data to display

## 2023-04-09 NOTE — Consult Note (Addendum)
Cardiology Consultation   Patient ID: Dave Daniels MRN: [id number]; DOB: 11-Nov-1940  Admit date: 04/08/2023 Date of Consult: 04/09/2023  PCP:  Cheron Schaumann., MD   [facility] Providers Cardiologist:  Olga Millers, MD  Electrophysiologist:  Regan Lemming, MD       Patient Profile:   Dave Daniels is a 82 y.o. 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 Maryclare Bean who is being seen 04/09/2023 for the evaluation of acute on chronic systolic heart failure at the request of Dr. Lowell Guitar.  History of Present Illness:   [name] is a 82 year old male with past medical history of CAD s/p CABG 06/2010, ICM s/p Abbott BiV ICD (12/12, gen change 05/08/2022), apical thrombus, persistent afib on Eliquis, HTN, HLD, LBBB and CKD stage IV followed by Dr. Maryclare Bean.  Patient had her first MI in 2010 and had stent placed in [place].  He had a second MI in March 2012 and underwent CABG.  Repeat cardiac catheterization performed in August 2012 showed EF 20%, occluded RCA and LAD, critical left circumflex lesion, patent SVG to RCA, patent LIMA to LAD, patent SVG to OM.  Patient underwent biventricular ICD implantation in December 2012.  Echocardiogram in June 2023 showed EF 20 to 25%, grade 3 diastolic dysfunction, apical thrombus, moderate RV enlargement, severe biatrial enlargement, mild MR, mild to moderate TR, mild AI.  He underwent generator change out by Dr. Elberta Fortis in January 2024.  More recently, he underwent cardioversion on 02/11/2023.  He was previously taking 20 mg twice daily dosing of Lasix.    Recently, he presented to the ED on 03/30/2023 for shortness of breath and was found to be volume overloaded.  He was given IV diuretic and the discharge from the emergency room.  Since then, he has been taking Lasix at 40 mg twice a day  dosing.  In the past 2 weeks, he continued to have worsening lower extremity edema and shortness of breath with minimal exertion.  He has no orthopnea or PND.  He denies any fever or chill or cough.  He denies any exertional chest pain.  He ultimately sought medical attention at [facility] on 04/08/2023.  On arrival, BNP was greater than 4500.  Creatinine was 2.20 which is near his baseline.  Hemoglobin 12.8.  Viral panel was negative for influenza, COVID and RSV.  Chest x-ray showed increased size of small right pleural effusion and a patchy opacity of the right lung base, cardiomegaly with pulmonary vascular congestion.  EKG showed atrial sensed and ventricularly paced rhythm.  Echocardiogram obtained on 04/08/2023 showed EF 20 to 25%, global hypokinesis, mildly reduced RV systolic function, RVSP 55.7 mmHg, moderate biatrial enlargement, mild MR, moderate AI.   Past Medical History:  Diagnosis Date   CAD (coronary artery disease)    MI in [place] with stenting in 2010, then MI with CABG in [place] 06/2010.  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   Complete heart block (HCC)    Gout    Hyperlipidemia    Hypertension    Ischemic cardiomyopathy    EF 15% by echo 11/2010 and still 20-25% by follow-up echo 02/2011, s/p St. Jude Bi-V ICD implantation 04/01/11   LBBB (left bundle branch block)    Renal insufficiency    Cr 1.6 on 03/25/11    Past  Surgical History:  Procedure Laterality Date   BI-VENTRICULAR IMPLANTABLE CARDIOVERTER DEFIBRILLATOR N/A 04/01/2011   Procedure: BI-VENTRICULAR IMPLANTABLE CARDIOVERTER DEFIBRILLATOR  (CRT-D);  Surgeon: Marinus Maw, MD;  Location: [facility] CATH LAB;  Daniels: Cardiovascular;  Laterality: N/A;   BIV ICD GENERATOR CHANGEOUT N/A 05/08/2022   Procedure: BIV ICD GENERATOR CHANGEOUT;  Surgeon: Regan Lemming, MD;  Location: [facility] INVASIVE CV LAB;  Daniels: Cardiovascular;  Laterality: N/A;   CARDIAC DEFIBRILLATOR PLACEMENT  12/12   BiV ICD (SJM) implanted by Dr  Johney Frame   CARDIOVERSION N/A 04/22/2018   Procedure: CARDIOVERSION;  Surgeon: Quintella Reichert, MD;  Location: [facility] ENDOSCOPY;  Daniels: Cardiovascular;  Laterality: N/A;   CARDIOVERSION N/A 06/13/2019   Procedure: CARDIOVERSION;  Surgeon: Quintella Reichert, MD;  Location: [facility] ENDOSCOPY;  Daniels: Cardiovascular;  Laterality: N/A;   CARDIOVERSION N/A 02/11/2023   Procedure: CARDIOVERSION (CATH LAB);  Surgeon: Thomasene Ripple, DO;  Location: [facility] INVASIVE CV LAB;  Daniels: Cardiovascular;  Laterality: N/A;   Carpel tunnel surgery     CORONARY ARTERY BYPASS GRAFT  3/12   in [place]   EP IMPLANTABLE DEVICE N/A 08/19/2015   Procedure: BIV ICD Generator Changeout;  Surgeon: Hillis Range, MD;  Location: [facility] INVASIVE CV LAB;  Daniels: Cardiovascular;  Laterality: N/A;   INGUINAL HERNIA REPAIR     TONSILLECTOMY     UMBILICAL HERNIA REPAIR       Home Medications:  Prior to Admission medications   Medication Sig Start Date End Date Taking? Authorizing Provider  acetaminophen (TYLENOL) 500 MG tablet Take 1,000 mg by mouth as needed for moderate pain or headache.    Yes [provider]  allopurinol (ZYLOPRIM) 300 MG tablet Take 300 mg by mouth daily as needed (Gout). 12/31/17  Yes [provider]  apixaban (ELIQUIS) 2.5 MG TABS tablet Take 1 tablet (2.5 mg total) by mouth 2 (two) times daily. 02/25/23  Yes Eustace Pen, PA-C  carvedilol (COREG) 12.5 MG tablet TAKE 1 TABLET BY MOUTH TWICE DAILY WITH A MEAL 03/25/23  Yes Lewayne Bunting, MD  cholecalciferol (VITAMIN D3) 25 MCG (1000 UNIT) tablet Take 1,000 Units by mouth daily.   Yes [provider]  colchicine 0.6 MG tablet Take 0.6 mg by mouth as needed (Gout).   Yes [provider]  Evolocumab (REPATHA SURECLICK) 140 MG/ML SOAJ Inject 140 mg into the skin every 14 (fourteen) days. 12/09/22  Yes Lewayne Bunting, MD  ezetimibe (ZETIA) 10 MG tablet Take 1 tablet (10 mg total) by mouth daily. 12/09/22  Yes Lewayne Bunting, MD   finasteride (PROSCAR) 5 MG tablet Take 5 mg by mouth daily.   Yes [provider]  furosemide (LASIX) 20 MG tablet Take 2 tablets by mouth once daily Patient taking differently: Take 20 mg by mouth 2 (two) times daily. 02/02/23  Yes Lewayne Bunting, MD  hydrALAZINE (APRESOLINE) 10 MG tablet TAKE 1 TABLET BY MOUTH THREE TIMES DAILY 01/19/23  Yes Lewayne Bunting, MD  isosorbide mononitrate (IMDUR) 30 MG 24 hr tablet Take 1 tablet by mouth once daily 09/08/22  Yes Camnitz, Will Daphine Deutscher, MD  oxybutynin (DITROPAN-XL) 10 MG 24 hr tablet Take 10 mg by mouth daily.   Yes [provider]  tamsulosin (FLOMAX) 0.4 MG CAPS capsule Take 0.4 mg by mouth daily. 02/03/22  Yes [provider]    Inpatient Medications: Scheduled Meds:  apixaban  2.5 mg Oral BID   carvedilol  12.5 mg Oral BID WC   ezetimibe  10 mg Oral Daily   finasteride  5 mg Oral Daily   furosemide  80 mg Intravenous BID   hydrALAZINE  10 mg Oral TID   isosorbide mononitrate  30 mg Oral Daily   oxybutynin  10 mg Oral Daily   sodium chloride flush  3 mL Intravenous Q12H   tamsulosin  0.4 mg Oral Daily   Continuous Infusions:  sodium chloride     PRN Meds: sodium chloride, acetaminophen, colchicine, ondansetron (ZOFRAN) IV, sodium chloride flush  Allergies:    Allergies  Allergen Reactions   Statins Hives    Muscle pain & severe hives   Nsaids Other (See Comments)    Renal insufficiency     Tolmetin Other (See Comments)    Renal insufficiency   Atorvastatin Rash   Pravastatin Rash    rash   Shellfish Allergy Rash    Social History:   Social History   Socioeconomic History   Marital status: Married    Spouse name: Not on file   Number of children: 4   Years of education: Not on file   Highest education level: Not on file  Occupational History    Comment: Retired  Tobacco Use   Smoking status: Never   Smokeless tobacco: Never  Vaping Use   Vaping status: Never Used  Substance and  Sexual Activity   Alcohol use: Yes    Alcohol/week: 2.0 standard drinks of alcohol    Types: 2 Glasses of wine per week    Comment: occasional   Drug use: No   Sexual activity: Yes  Other Topics Concern   Not on file  Social History Narrative   Lives in [place], recently moved from [place].  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   Social Drivers of Corporate investment banker Strain: Not on file  Food Insecurity: No Food Insecurity (04/08/2023)   Hunger Vital Sign    Worried About Running Out of Food in the Last Year: Never true    Ran Out of Food in the Last Year: Never true  Transportation Needs: No Transportation Needs (04/08/2023)   PRAPARE - Administrator, Civil Daniels (Medical): No    Lack of Transportation (Non-Medical): No  Physical Activity: Not on file  Stress: Not on file  Social Connections: Not on file  Intimate Partner Violence: Not At Risk (04/08/2023)   Humiliation, Afraid, Rape, and Kick questionnaire    Fear of Current or Ex-Partner: No    Emotionally Abused: No    Physically Abused: No    Sexually Abused: No    Family History:    Family History  Problem Relation Age of Onset   Pancreatic cancer Mother      ROS:  Please see the history of present illness.   All other ROS reviewed and negative.     Physical Exam/Data:   Vitals:   04/08/23 2117 04/09/23 0038 04/09/23 0537 04/09/23 0708  BP: 138/77 (!) 141/79 138/84 138/84  Pulse:  63 61 65  Resp:  18 17   Temp:  97.7 F (36.5 C) 98 F (36.7 C)   TempSrc:  Oral Oral   SpO2:  98% 99%   Weight:   71.8 kg   Height:        Intake/Output Summary (Last 24 hours) at 04/09/2023 1125 Last data filed at 04/09/2023 1059 Gross per 24 hour  Intake 290 ml  Output 1900 ml  Net -1610 ml      04/09/2023  5:37 AM 04/08/2023    3:40 PM 04/08/2023   11:17 AM  Last 3 Weights  Weight (lbs) 158 lb 4.6 oz 158 lb 4.6 oz 154 lb 5.2 oz  Weight (kg) 71.8 kg 71.8 kg 70 kg     Body mass index is 24.79  kg/m.  General:  Well nourished, well developed, in no acute distress HEENT: normal Neck: no JVD Vascular: No carotid bruits; Distal pulses 2+ bilaterally Cardiac:  normal S1, S2; RRR; no murmur  Lungs:  clear to auscultation bilaterally, no wheezing, rhonchi or rales  Abd: soft, nontender, no hepatomegaly  Ext: 3+ edema Musculoskeletal:  No deformities, BUE and BLE strength normal and equal Skin: warm and dry  Neuro:  CNs 2-12 intact, no focal abnormalities noted Psych:  Normal affect   EKG:  The EKG was personally reviewed and demonstrates: Atrial sensed and ventricular paced rhythm Telemetry:  Telemetry was personally reviewed and demonstrates: Atrial sensed and ventricular paced rhythm  Relevant CV Studies:  Echo 04/08/2023 1. Left ventricular ejection fraction, by estimation, is 20 to 25%. The  left ventricle has severely decreased function. The left ventricle  demonstrates global hypokinesis. The left ventricular internal cavity size  was moderately dilated. Left  ventricular diastolic parameters are indeterminate.   2. Right ventricular systolic function is mildly reduced. The right  ventricular size is mildly enlarged. There is moderately elevated  pulmonary artery systolic pressure. The estimated right ventricular  systolic pressure is 55.7 mmHg.   3. Left atrial size was moderately dilated.   4. Right atrial size was moderately dilated.   5. The mitral valve is normal in structure. Mild mitral valve  regurgitation. No evidence of mitral stenosis.   6. The aortic valve is tricuspid. There is mild calcification of the  aortic valve. Aortic valve regurgitation is moderate. Aortic valve  sclerosis is present, with no evidence of aortic valve stenosis. Aortic  regurgitation PHT measures 503 msec.   7. Aortic dilatation noted. There is mild dilatation of the ascending  aorta, measuring 41 mm.   8. The inferior vena cava is dilated in size with <50% respiratory   variability, suggesting right atrial pressure of 15 mmHg.   Comparison(s): Prior images reviewed side by side.   Conclusion(s)/Recommendation(s): No left ventricular mural or apical  thrombus/thrombi.   Laboratory Data:  High Sensitivity Troponin:  No results for input(s): "TROPONINIHS" in the last 720 hours.   Chemistry Recent Labs  Lab 04/08/23 1138 04/09/23 0238  NA 135 138  K 3.6 3.5  CL 99 100  CO2 26 24  GLUCOSE 118* 92  BUN 40* 38*  CREATININE 2.20* 2.29*  CALCIUM 9.0 9.1  GFRNONAA 29* 28*  ANIONGAP 10 14    Recent Labs  Lab 04/08/23 1202  PROT 6.4*  ALBUMIN 3.2*  AST 31  ALT 27  ALKPHOS 170*  BILITOT 1.8*   Lipids No results for input(s): "CHOL", "TRIG", "HDL", "LABVLDL", "LDLCALC", "CHOLHDL" in the last 168 hours.  Hematology Recent Labs  Lab 04/08/23 1138  WBC 5.4  RBC 4.57  HGB 12.8*  HCT 40.2  MCV 88.0  MCH 28.0  MCHC 31.8  RDW 14.7  PLT 209   Thyroid No results for input(s): "TSH", "FREET4" in the last 168 hours.  BNP Recent Labs  Lab 04/08/23 1138  BNP >4,500.0*    DDimer No results for input(s): "DDIMER" in the last 168 hours.   Radiology/Studies:  ECHOCARDIOGRAM COMPLETE Result Date: 04/09/2023    ECHOCARDIOGRAM REPORT  Patient Name:   Dave Daniels Date of Exam: 04/08/2023 Medical Rec #:  [id number]       Height:       67.0 in Accession #:    [id number]      Weight:       158.3 lb Date of Birth:  1940/06/25       BSA:          1.831 m Patient Age:    82 years        BP:           142/82 mmHg Patient Gender: M               HR:           70 bpm. Exam Location:  Inpatient Procedure: 2D Echo, Cardiac Doppler, Color Doppler and Intracardiac            Opacification Agent Indications:    CHF- Acute Systolic I50.21  History:        Patient has prior history of Echocardiogram examinations, most                 recent 09/12/2021. Cardiomyopathy, CAD, Pacemaker, CKD, stage 3,                 Arrythmias:Atrial Fibrillation, Atrial Flutter and  LBBB; Risk                 Factors:Hypertension and Dyslipidemia.  Sonographer:    Lucendia Herrlich RCS Referring Phys: [phone] RONDELL A SMITH IMPRESSIONS  1. Left ventricular ejection fraction, by estimation, is 20 to 25%. The left ventricle has severely decreased function. The left ventricle demonstrates global hypokinesis. The left ventricular internal cavity size was moderately dilated. Left ventricular diastolic parameters are indeterminate.  2. Right ventricular systolic function is mildly reduced. The right ventricular size is mildly enlarged. There is moderately elevated pulmonary artery systolic pressure. The estimated right ventricular systolic pressure is 55.7 mmHg.  3. Left atrial size was moderately dilated.  4. Right atrial size was moderately dilated.  5. The mitral valve is normal in structure. Mild mitral valve regurgitation. No evidence of mitral stenosis.  6. The aortic valve is tricuspid. There is mild calcification of the aortic valve. Aortic valve regurgitation is moderate. Aortic valve sclerosis is present, with no evidence of aortic valve stenosis. Aortic regurgitation PHT measures 503 msec.  7. Aortic dilatation noted. There is mild dilatation of the ascending aorta, measuring 41 mm.  8. The inferior vena cava is dilated in size with <50% respiratory variability, suggesting right atrial pressure of 15 mmHg. Comparison(s): Prior images reviewed side by side. Conclusion(s)/Recommendation(s): No left ventricular mural or apical thrombus/thrombi. FINDINGS  Left Ventricle: Left ventricular ejection fraction, by estimation, is 20 to 25%. The left ventricle has severely decreased function. The left ventricle demonstrates global hypokinesis. Definity contrast agent was given IV to delineate the left ventricular endocardial borders. The left ventricular internal cavity size was moderately dilated. There is no left ventricular hypertrophy. Abnormal (paradoxical) septal motion, consistent with left  bundle branch block. Left ventricular diastolic parameters are indeterminate. Right Ventricle: The right ventricular size is mildly enlarged. No increase in right ventricular wall thickness. Right ventricular systolic function is mildly reduced. There is moderately elevated pulmonary artery systolic pressure. The tricuspid regurgitant velocity is 3.19 m/s, and with an assumed right atrial pressure of 15 mmHg, the estimated right ventricular systolic pressure is 55.7 mmHg. Left Atrium: Left atrial size was moderately dilated. Right Atrium:  Right atrial size was moderately dilated. Pericardium: There is no evidence of pericardial effusion. Mitral Valve: The mitral valve is normal in structure. Mild mitral valve regurgitation. No evidence of mitral valve stenosis. Tricuspid Valve: The tricuspid valve is normal in structure. Tricuspid valve regurgitation is mild . No evidence of tricuspid stenosis. Aortic Valve: The aortic valve is tricuspid. There is mild calcification of the aortic valve. Aortic valve regurgitation is moderate. Aortic regurgitation PHT measures 503 msec. Aortic valve sclerosis is present, with no evidence of aortic valve stenosis. Aortic valve peak gradient measures 5.8 mmHg. Pulmonic Valve: The pulmonic valve was normal in structure. Pulmonic valve regurgitation is mild. No evidence of pulmonic stenosis. Aorta: Aortic dilatation noted. There is mild dilatation of the ascending aorta, measuring 41 mm. Venous: The inferior vena cava is dilated in size with less than 50% respiratory variability, suggesting right atrial pressure of 15 mmHg. IAS/Shunts: No atrial level shunt detected by color flow Doppler. Additional Comments: A device lead is visualized in the right ventricle and right atrium.  LEFT VENTRICLE PLAX 2D LVIDd:         6.30 cm   Diastology LVIDs:         5.60 cm   LV e' medial:    3.23 cm/s LV PW:         0.90 cm   LV E/e' medial:  29.3 LV IVS:        0.50 cm   LV e' lateral:   3.23 cm/s  LVOT diam:     1.90 cm   LV E/e' lateral: 29.3 LV SV:         52 LV SV Index:   29 LVOT Area:     2.84 cm  RIGHT VENTRICLE            IVC RV S prime:     9.08 cm/s  IVC diam: 2.40 cm TAPSE (M-mode): 1.1 cm LEFT ATRIUM             Index        RIGHT ATRIUM           Index LA diam:        4.50 cm 2.46 cm/m   RA Area:     23.00 cm LA Vol (A2C):   63.7 ml 34.79 ml/m  RA Volume:   78.20 ml  42.71 ml/m LA Vol (A4C):   58.9 ml 32.17 ml/m LA Biplane Vol: 62.0 ml 33.86 ml/m  AORTIC VALVE                 PULMONIC VALVE AV Area (Vmax): 2.49 cm     PR End Diast Vel: 9.42 msec AV Vmax:        120.50 cm/s AV Peak Grad:   5.8 mmHg LVOT Vmax:      106.00 cm/s LVOT Vmean:     62.800 cm/s LVOT VTI:       0.185 m AI PHT:         503 msec  AORTA Ao Root diam: 3.30 cm Ao Asc diam:  4.10 cm MITRAL VALVE               TRICUSPID VALVE MV Area (PHT): 3.99 cm    TR Peak grad:   40.7 mmHg MV Decel Time: 190 msec    TR Vmax:        319.00 cm/s MR Peak grad: 41.4 mmHg MR Vmax:      321.67 cm/s  SHUNTS MV E velocity:  94.80 cm/s  Systemic VTI:  0.18 m MV A velocity: 71.10 cm/s  Systemic Diam: 1.90 cm MV E/A ratio:  1.33 Donato Schultz MD Electronically signed by Donato Schultz MD Signature Date/Time: 04/09/2023/6:57:59 AM    Final    DG Chest 2 View Result Date: 04/08/2023 CLINICAL DATA:  Shortness of breath. Bilateral lower extremity swelling. EXAM: CHEST - 2 VIEW COMPARISON:  Chest radiograph dated March 30, 2023. FINDINGS: Stable cardiomegaly with pulmonary vascular congestion. Stable left-sided pacemaker in place. Prior median sternotomy. Increased size of a small right pleural effusion with increased patchy opacity at the right lung base. No pneumothorax. No acute osseous abnormality. IMPRESSION: 1. Increased size of a small right pleural effusion and patchy opacity at the right lung base, which could represent atelectasis or infiltrate. 2. Cardiomegaly with pulmonary vascular congestion. Electronically Signed   By: Hart Robinsons M.D.   On: 04/08/2023 12:18     Assessment and Plan:   Acute on chronic systolic heart failure  -Patient has had low EF for many years.  EF has been around 20 to 30% since at least 2012.  Highest ejection fraction was based on echocardiogram in February 2022 where his EF was 30 to 35%.  Previous echocardiogram in June 2023 showed EF 20 to 25% with apical LV thrombus, grade 3 diastolic dysfunction.  -Repeat echo 04/08/2023 showed EF 20 to 25%, global hypokinesis, RVSP 55.7 mmHg, biatrial enlargement  -Previously on 20 mg twice daily of oral Lasix, Lasix increased to 40 mg twice daily after presenting to the ED on 03/30/2023 with volume overload.  Unfortunately patient continued to accumulate fluid currently has 3+ pitting edema on physical exam.  -Agree with 80 mg twice daily IV Lasix.  Patient put out 1.2 L of fluid since admission.  Renal function stable.  CAD s/p CABG 06/2010: Denies any anginal symptom.  EF has been persistently low since 2012  Ischemic cardiomyopathy s/p Abbott BiV ICD: Generator change out 05/08/2022.  Atrial sensed ventricularly paced rhythm seen on telemetry.  Apical thrombus: Previously seen on echocardiogram in 2023.  Persistent atrial fibrillation on Eliquis: Recently underwent DC cardioversion in October.  Currently maintaining sinus rhythm on telemetry.  Hypertension: GDMT: Imdur/hydralazine and coreg. Renal function make him unlikely candidate for ACEI/ARB/ARNI  Hyperlipidemia  CKD stage IV followed by Dr. Conni Elliot   Risk Assessment/Risk Scores:        New York Heart Association (NYHA) Functional Class NYHA Class III  CHA2DS2-VASc Score = 5   This indicates a 7.2% annual risk of stroke. The patient's score is based upon: CHF History: 1 HTN History: 1 Diabetes History: 0 Stroke History: 0 Vascular Disease History: 1 Age Score: 2 Gender Score: 0       For questions or updates, please contact [facility] Please consult  [email or web address] for contact info under    Signed, Azalee Course, PA  04/09/2023 11:25 AM  History and all data above reviewed.  Patient examined.  I agree with the findings as above.  This nice gentleman is originally from [place].  He presents with acute on chronic systolic and diastolic heart failure.  He was here a few days ago but does not think that his volume was adequately controlled when he was in the emergency room.  His dry weight is around 153 pounds he reports.  At home he has been closer to 160.  He has not been watching his salt is carefully until about the last week.  He does not drink  a lot of water.  He gets around the house unassisted.  He is not having any new PND or orthopnea.  He presented because of increased lower extremity swelling and shortness of breath.  He is not having any new chest pressure, neck or arm discomfort.  Not having any new palpitations, presyncope or syncope.  The The patient exam reveals COR: Regular rate and rhythm, 2 out of 6 apical systolic murmur 2 out of 6 diastolic murmur heard best at the third left interspace,  Lungs: Mildly decreased breath sounds bilaterally,  Abd: Positive bowel sounds normal frequency pitch, Ext moderate to severe below the knee edema bilaterally.  All available labs, radiology testing, previous records reviewed. Agree with documented assessment and plan.  Acute on chronic systolic and diastolic heart failure: We reviewed fluid and salt restrictions.  He can be better with his salt.   For now he seems to be responding well to IV Lasix.  I am going to continue this and when he goes home I would suggest torsemide perhaps 40 in the morning and 20 mg in the afternoon.  He would need close follow-up of his creatinine.  I also have suggested compression stockings and will have these applied here.  No further invasive or noninvasive testing will be indicated.  I also think he will tolerate slightly higher dose of isosorbide mononitrate to get [place]  closer to target doses.  I will increase that during this admission and it can be titrated further afterwards.  Fayrene Fearing Charlis Harner  12:39 PM  04/09/2023

## 2023-04-09 NOTE — Progress Notes (Signed)
Patient currently hospitalized.  ICM remote transmission rescheduled from 12/30 to 04/19/2023.

## 2023-04-09 NOTE — Progress Notes (Signed)
PROGRESS NOTE    Dave Daniels  WGN:[id number] DOB: 05/02/1940 DOA: 04/08/2023 PCP: Cheron Schaumann., MD  Chief Complaint  Patient presents with   Leg Swelling   Shortness of Breath    Brief Narrative:   Dave Daniels is Dave Daniels 82 y.o. male with medical history significant of HTN, HLD, atrial fibrillation/flutter, CAD s/p PCI and CABG, HFrEF, s/p ICD CKD stage IV who presents with progressively worsening shortness of breath over the last 2 weeks.  During this time he has had lower extremity edema extending from the feet to the knees. The patient reports associated shortness of breath and fatigue, particularly noticeable during even minor physical exertion, necessitating frequent rest periods. There is no associated chest discomfort. The patient occasionally experiences discomfort when lying flat, requiring him to sit up for relief.   The patient's weight has been fluctuating between 153 and 161 pounds, with the most recent weight recorded at home being 154 pounds. Despite an increase in Lasix dosage following Piero Mustard recent emergency department visit, the patient reports no significant improvement in symptoms.The patient denies any cough or abdominal distention, but notes persistent lower extremity swelling. The patient is under the care of Dr. Maryclare Bean of nephrology.   Records note patient had underwent successful course ablation of atrial fibrillation on 10/31 and prior to that underwent generator change out with Dr. Dr. Elberta Fortis for his ICD on 1/26.   In the emergency department patient was noted to be afebrile with pulse 54-68, and all other vital signs maintained.  Labs notable for BNP elevated greater than 4500.  Chest x-ray noted increased size and pleural effusion,  patchy opacities in the right lung base, and cardiomegaly with pulmonary vascular congestion.  Last echocardiogram noted EF to be 20 to 25% with grade 3 diastolic dysfunction back in 2023.Patient had been given Lasix 40 mg IV x  1 dose. Accepted as inpatient to [facility] cardiac telemetry bed.   Assessment & Plan:   Principal Problem:   Heart failure with reduced ejection fraction (HCC) Active Problems:   Paroxysmal atrial fibrillation (HCC)   Hypertension   Cardiac defibrillator in situ   Hyperlipidemia   CKD (chronic kidney disease), stage IV (HCC)   BPH with obstruction/lower urinary tract symptoms  Heart failure with reduced ejection fraction with exacerbation -echo with EF 20-25%, global hypokinesis, moderately elevated PASP -will continue lasix -strict I/O, daily weights -appreciate cardiology assistance    Paroxysmal atrial fibrillation on chronic anticoagulation -Continue Eliquis   Essential hypertension Blood pressures are currently maintained. -Continue Coreg, hydralazine, isosorbide mononitrate   S/p AICD Patient had his AICD generator changed out on 05/08/2022 by Dr. Elberta Fortis.   Hyperlipidemia Patient is on Repatha and Zetia -Continue Zetia -Plan to resume Repatha in outpatient setting   Chronic kidney disease stage IV On admission creatinine 2.2 with BUN 40.  Baseline creatinine 2.2-2.8. -Continue to monitor kidney function with diuresis   BPH -Continue finasteride and tamsulosin    DVT prophylaxis: eliquis Code Status: full Family Communication: none Disposition:   Status is: Inpatient Remains inpatient appropriate because: need for inptient care   Consultants:  card  Procedures:  Echo IMPRESSIONS     1. Left ventricular ejection fraction, by estimation, is 20 to 25%. The  left ventricle has severely decreased function. The left ventricle  demonstrates global hypokinesis. The left ventricular internal cavity size  was moderately dilated. Left  ventricular diastolic parameters are indeterminate.   2. Right ventricular systolic function is mildly reduced. The  right  ventricular size is mildly enlarged. There is moderately elevated  pulmonary artery systolic pressure. The  estimated right ventricular  systolic pressure is 55.7 mmHg.   3. Left atrial size was moderately dilated.   4. Right atrial size was moderately dilated.   5. The mitral valve is normal in structure. Mild mitral valve  regurgitation. No evidence of mitral stenosis.   6. The aortic valve is tricuspid. There is mild calcification of the  aortic valve. Aortic valve regurgitation is moderate. Aortic valve  sclerosis is present, with no evidence of aortic valve stenosis. Aortic  regurgitation PHT measures 503 msec.   7. Aortic dilatation noted. There is mild dilatation of the ascending  aorta, measuring 41 mm.   8. The inferior vena cava is dilated in size with <50% respiratory  variability, suggesting right atrial pressure of 15 mmHg.   Comparison(s): Prior images reviewed side by side.   Conclusion(s)/Recommendation(s): No left ventricular mural or apical  thrombus/thrombi.   Antimicrobials:  Anti-infectives (From admission, onward)    None       Subjective: Feels better  Objective: Vitals:   04/09/23 0537 04/09/23 0708 04/09/23 1135 04/09/23 1530  BP: 138/84 138/84 (!) 138/94 130/70  Pulse: 61 65 (!) 59 61  Resp: 17  18 18   Temp: 98 F (36.7 C)  97.9 F (36.6 C) 97.7 F (36.5 C)  TempSrc: Oral  Oral Oral  SpO2: 99%  95% 99%  Weight: 71.8 kg     Height:        Intake/Output Summary (Last 24 hours) at 04/09/2023 1621 Last data filed at 04/09/2023 1059 Gross per 24 hour  Intake 290 ml  Output 1900 ml  Net -1610 ml   Filed Weights   04/08/23 1117 04/08/23 1540 04/09/23 0537  Weight: 70 kg 71.8 kg 71.8 kg    Examination:  General exam: Appears calm and comfortable  Respiratory system: unlabored Cardiovascular system: RRR Central nervous system: Alert and oriented. No focal neurological deficits. Extremities: bilateral LE edema   Data Reviewed: I have personally reviewed following labs and imaging studies  CBC: Recent Labs  Lab 04/08/23 1138  WBC 5.4   HGB 12.8*  HCT 40.2  MCV 88.0  PLT 209    Basic Metabolic Panel: Recent Labs  Lab 04/08/23 1138 04/09/23 0238  NA 135 138  K 3.6 3.5  CL 99 100  CO2 26 24  GLUCOSE 118* 92  BUN 40* 38*  CREATININE 2.20* 2.29*  CALCIUM 9.0 9.1    GFR: Estimated Creatinine Clearance: 23.3 mL/min (Dave Daniels) (by C-G formula based on SCr of 2.29 mg/dL (H)).  Liver Function Tests: Recent Labs  Lab 04/08/23 1202  AST 31  ALT 27  ALKPHOS 170*  BILITOT 1.8*  PROT 6.4*  ALBUMIN 3.2*    CBG: No results for input(s): "GLUCAP" in the last 168 hours.   Recent Results (from the past 240 hours)  Resp panel by RT-PCR (RSV, Flu Dave Daniels&B, Covid) Anterior Nasal Swab     Status: None   Collection Time: 04/08/23 11:22 AM   Specimen: Anterior Nasal Swab  Result Value Ref Range Status   SARS Coronavirus 2 by RT PCR NEGATIVE NEGATIVE Final    Comment: (NOTE) SARS-CoV-2 target nucleic acids are NOT DETECTED.  The SARS-CoV-2 RNA is generally detectable in upper respiratory specimens during the acute phase of infection. The lowest concentration of SARS-CoV-2 viral copies this assay can detect is 138 copies/mL. Dave Daniels negative result does not preclude SARS-Cov-2 infection  and should not be used as the sole basis for treatment or other patient management decisions. Dave Daniels negative result may occur with  improper specimen collection/handling, submission of specimen other than nasopharyngeal swab, presence of viral mutation(s) within the areas targeted by this assay, and inadequate number of viral copies(<138 copies/mL). Dave Daniels negative result must be combined with clinical observations, patient history, and epidemiological information. The expected result is Negative.  Fact Sheet for Patients:  BloggerCourse.com  Fact Sheet for Healthcare Providers:  SeriousBroker.it  This test is no t yet approved or cleared by the [place] FDA and  has been authorized for detection  and/or diagnosis of SARS-CoV-2 by FDA under an Emergency Use Authorization (EUA). This EUA will remain  in effect (meaning this test can be used) for the duration of the COVID-19 declaration under Section 564(b)(1) of the Act, 21 U.S.C.section 360bbb-3(b)(1), unless the authorization is terminated  or revoked sooner.       Influenza Dave Daniels by PCR NEGATIVE NEGATIVE Final   Influenza B by PCR NEGATIVE NEGATIVE Final    Comment: (NOTE) The Xpert Xpress SARS-CoV-2/FLU/RSV plus assay is intended as an aid in the diagnosis of influenza from Nasopharyngeal swab specimens and should not be used as Kash Davie sole basis for treatment. Nasal washings and aspirates are unacceptable for Xpert Xpress SARS-CoV-2/FLU/RSV testing.  Fact Sheet for Patients: BloggerCourse.com  Fact Sheet for Healthcare Providers: SeriousBroker.it  This test is not yet approved or cleared by the [place] FDA and has been authorized for detection and/or diagnosis of SARS-CoV-2 by FDA under an Emergency Use Authorization (EUA). This EUA will remain in effect (meaning this test can be used) for the duration of the COVID-19 declaration under Section 564(b)(1) of the Act, 21 U.S.C. section 360bbb-3(b)(1), unless the authorization is terminated or revoked.     Resp Syncytial Virus by PCR NEGATIVE NEGATIVE Final    Comment: (NOTE) Fact Sheet for Patients: BloggerCourse.com  Fact Sheet for Healthcare Providers: SeriousBroker.it  This test is not yet approved or cleared by the [place] FDA and has been authorized for detection and/or diagnosis of SARS-CoV-2 by FDA under an Emergency Use Authorization (EUA). This EUA will remain in effect (meaning this test can be used) for the duration of the COVID-19 declaration under Section 564(b)(1) of the Act, 21 U.S.C. section 360bbb-3(b)(1), unless the authorization is terminated  or revoked.  Performed at [facility], [address], [place], [place] [address]          Radiology Studies: ECHOCARDIOGRAM COMPLETE Result Date: 04/09/2023    ECHOCARDIOGRAM REPORT   Patient Name:   [place] Service Date of Exam: 04/08/2023 Medical Rec #:  [id number]       Height:       67.0 in Accession #:    [id number]      Weight:       158.3 lb Date of Birth:  1940-05-06       BSA:          1.831 m Patient Age:    82 years        BP:           142/82 mmHg Patient Gender: M               HR:           70 bpm. Exam Location:  Inpatient Procedure: 2D Echo, Cardiac Doppler, Color Doppler and Intracardiac            Opacification  Agent Indications:    CHF- Acute Systolic I50.21  History:        Patient has prior history of Echocardiogram examinations, most                 recent 09/12/2021. Cardiomyopathy, CAD, Pacemaker, CKD, stage 3,                 Arrythmias:Atrial Fibrillation, Atrial Flutter and LBBB; Risk                 Factors:Hypertension and Dyslipidemia.  Sonographer:    Dave Daniels RCS Referring Phys: [phone] Dave Daniels IMPRESSIONS  1. Left ventricular ejection fraction, by estimation, is 20 to 25%. The left ventricle has severely decreased function. The left ventricle demonstrates global hypokinesis. The left ventricular internal cavity size was moderately dilated. Left ventricular diastolic parameters are indeterminate.  2. Right ventricular systolic function is mildly reduced. The right ventricular size is mildly enlarged. There is moderately elevated pulmonary artery systolic pressure. The estimated right ventricular systolic pressure is 55.7 mmHg.  3. Left atrial size was moderately dilated.  4. Right atrial size was moderately dilated.  5. The mitral valve is normal in structure. Mild mitral valve regurgitation. No evidence of mitral stenosis.  6. The aortic valve is tricuspid. There is mild calcification of the aortic valve. Aortic valve regurgitation is  moderate. Aortic valve sclerosis is present, with no evidence of aortic valve stenosis. Aortic regurgitation PHT measures 503 msec.  7. Aortic dilatation noted. There is mild dilatation of the ascending aorta, measuring 41 mm.  8. The inferior vena cava is dilated in size with <50% respiratory variability, suggesting right atrial pressure of 15 mmHg. Comparison(s): Prior images reviewed side by side. Conclusion(s)/Recommendation(s): No left ventricular mural or apical thrombus/thrombi. FINDINGS  Left Ventricle: Left ventricular ejection fraction, by estimation, is 20 to 25%. The left ventricle has severely decreased function. The left ventricle demonstrates global hypokinesis. Definity contrast agent was given IV to delineate the left ventricular endocardial borders. The left ventricular internal cavity size was moderately dilated. There is no left ventricular hypertrophy. Abnormal (paradoxical) septal motion, consistent with left bundle branch block. Left ventricular diastolic parameters are indeterminate. Right Ventricle: The right ventricular size is mildly enlarged. No increase in right ventricular wall thickness. Right ventricular systolic function is mildly reduced. There is moderately elevated pulmonary artery systolic pressure. The tricuspid regurgitant velocity is 3.19 m/s, and with an assumed right atrial pressure of 15 mmHg, the estimated right ventricular systolic pressure is 55.7 mmHg. Left Atrium: Left atrial size was moderately dilated. Right Atrium: Right atrial size was moderately dilated. Pericardium: There is no evidence of pericardial effusion. Mitral Valve: The mitral valve is normal in structure. Mild mitral valve regurgitation. No evidence of mitral valve stenosis. Tricuspid Valve: The tricuspid valve is normal in structure. Tricuspid valve regurgitation is mild . No evidence of tricuspid stenosis. Aortic Valve: The aortic valve is tricuspid. There is mild calcification of the aortic valve.  Aortic valve regurgitation is moderate. Aortic regurgitation PHT measures 503 msec. Aortic valve sclerosis is present, with no evidence of aortic valve stenosis. Aortic valve peak gradient measures 5.8 mmHg. Pulmonic Valve: The pulmonic valve was normal in structure. Pulmonic valve regurgitation is mild. No evidence of pulmonic stenosis. Aorta: Aortic dilatation noted. There is mild dilatation of the ascending aorta, measuring 41 mm. Venous: The inferior vena cava is dilated in size with less than 50% respiratory variability, suggesting right atrial pressure of 15 mmHg. IAS/Shunts: No  atrial level shunt detected by color flow Doppler. Additional Comments: Avalene Sealy device lead is visualized in the right ventricle and right atrium.  LEFT VENTRICLE PLAX 2D LVIDd:         6.30 cm   Diastology LVIDs:         5.60 cm   LV e' medial:    3.23 cm/s LV PW:         0.90 cm   LV E/e' medial:  29.3 LV IVS:        0.50 cm   LV e' lateral:   3.23 cm/s LVOT diam:     1.90 cm   LV E/e' lateral: 29.3 LV SV:         52 LV SV Index:   29 LVOT Area:     2.84 cm  RIGHT VENTRICLE            IVC RV S prime:     9.08 cm/s  IVC diam: 2.40 cm TAPSE (M-mode): 1.1 cm LEFT ATRIUM             Index        RIGHT ATRIUM           Index LA diam:        4.50 cm 2.46 cm/m   RA Area:     23.00 cm LA Vol (A2C):   63.7 ml 34.79 ml/m  RA Volume:   78.20 ml  42.71 ml/m LA Vol (A4C):   58.9 ml 32.17 ml/m LA Biplane Vol: 62.0 ml 33.86 ml/m  AORTIC VALVE                 PULMONIC VALVE AV Area (Vmax): 2.49 cm     PR End Diast Vel: 9.42 msec AV Vmax:        120.50 cm/s AV Peak Grad:   5.8 mmHg LVOT Vmax:      106.00 cm/s LVOT Vmean:     62.800 cm/s LVOT VTI:       0.185 m AI PHT:         503 msec  AORTA Ao Root diam: 3.30 cm Ao Asc diam:  4.10 cm MITRAL VALVE               TRICUSPID VALVE MV Area (PHT): 3.99 cm    TR Peak grad:   40.7 mmHg MV Decel Time: 190 msec    TR Vmax:        319.00 cm/s MR Peak grad: 41.4 mmHg MR Vmax:      321.67 cm/s  SHUNTS MV E  velocity: 94.80 cm/s  Systemic VTI:  0.18 m MV Rakiya Krawczyk velocity: 71.10 cm/s  Systemic Diam: 1.90 cm MV E/Kaylee Wombles ratio:  1.33 Donato Schultz MD Electronically signed by Donato Schultz MD Signature Date/Time: 04/09/2023/6:57:59 AM    Final    DG Chest 2 View Result Date: 04/08/2023 CLINICAL DATA:  Shortness of breath. Bilateral lower extremity swelling. EXAM: CHEST - 2 VIEW COMPARISON:  Chest radiograph dated March 30, 2023. FINDINGS: Stable cardiomegaly with pulmonary vascular congestion. Stable left-sided pacemaker in place. Prior median sternotomy. Increased size of Wanette Robison small right pleural effusion with increased patchy opacity at the right lung base. No pneumothorax. No acute osseous abnormality. IMPRESSION: 1. Increased size of Symiah Nowotny small right pleural effusion and patchy opacity at the right lung base, which could represent atelectasis or infiltrate. 2. Cardiomegaly with pulmonary vascular congestion. Electronically Signed   By: Hart Robinsons M.D.   On: 04/08/2023 12:18  Scheduled Meds:  apixaban  2.5 mg Oral BID   carvedilol  12.5 mg Oral BID WC   ezetimibe  10 mg Oral Daily   finasteride  5 mg Oral Daily   furosemide  80 mg Intravenous BID   hydrALAZINE  20 mg Oral TID   [START ON 04/10/2023] isosorbide mononitrate  60 mg Oral Daily   oxybutynin  10 mg Oral Daily   sodium chloride flush  3 mL Intravenous Q12H   tamsulosin  0.4 mg Oral Daily   Continuous Infusions:   LOS: 1 day    Time spent: over 30 min    Lacretia Nicks, MD [facility]   To contact the attending provider between 7A-7P or the covering provider during after hours 7P-7A, please log into the web site [email or web address] and access using universal  password for that web site. If you do not have the password, please call the hospital operator.  04/09/2023, 4:21 PM

## 2023-04-09 NOTE — Progress Notes (Signed)
   Heart Failure Stewardship Pharmacist Progress Note   PCP: Cheron Schaumann., MD PCP-Cardiologist: Olga Millers, MD    HPI:  82 yo M with PMH of CHF, CAD s/p CABG, CHB s/p PPM, HTN, HLD, afib, and CKD IV.   He was recently seen in the ED on 12/17 with shortness of breath and his lasix was increased. Advised to follow up with cardiology.  Presented to the ED on 12/26 with shortness of breath, orthopnea, and LE edema. Did not feel significant change in symptoms following increase in lasix. CXR with small right pleural effusion, cardiomegaly, and pulmonary vascular congestion. BNP >4500. ECHO 12/26 with LVEF 20-25% (stable from 09/2021), global hypokinesis, RV mildly reduced, mild MR, moderate AR.   Reports dry weight around 153 lbs. Has not been compliant with salt restrictions over the last week. Still has 3+ pitting LE edema.   Current HF Medications: Diuretic: furosemide 80 mg IV BID Beta Blocker: carvedilol 12.5 mg BID Other: hydralazine 10 mg TID + Imdur 30 mg daily  Prior to admission HF Medications: Diuretic: furosemide 20 mg BID Beta blocker: carvedilol 12.5 mg BID Other: hydralazine 10 mg TID + Imdur 30 mg daily  Pertinent Lab Values: Serum creatinine 2.29, BUN 38, Potassium 3.5, Sodium 138, BNP >4500  Vital Signs: Weight: 158 lbs (admission weight: 158 lbs) Blood pressure: 130/80s  Heart rate: 60s  I/O: net -0.3L yesterday; net -1.1L since admission  Medication Assistance / Insurance Benefits Check: Does the patient have prescription insurance?  Yes Type of insurance plan: [employer]  Outpatient Pharmacy:  Prior to admission outpatient pharmacy: [facility] Is the patient willing to use [facility] TOC pharmacy at discharge? Yes Is the patient willing to transition their outpatient pharmacy to utilize a [facility] outpatient pharmacy?   Pending    Assessment: 1. Acute on chronic systolic CHF (LVEF 20-25%), due to ICM. NYHA class III symptoms. - Continue  furosemide 80 mg IV BID. Strict I/Os and daily weights. Keep K>4 and Mg>2. Recommend KCl 40 mEq x 1 and check magnesium in AM. - Continue carvedilol 12.5 mg BID - Continue hydralazine 10 mg TID + Imdur 30 mg daily - may need to increase pending BP with IV diuretics. - GDMT limited by CKD IV. Baseline creatinine 2.2-2.8.   Plan: 1) Medication changes recommended at this time: - KCl 40 mEq x 1 - Check magnesium in AM - May need to increase hydralazine/imdur  2) Patient assistance: - None pending  3)  Education  - To be completed prior to discharge  Sharen Hones, PharmD, BCPS Heart Failure Stewardship Pharmacist Phone [phone]

## 2023-04-09 NOTE — Plan of Care (Signed)
  Problem: Education: Goal: Knowledge of General Education information will improve Description: Including pain rating scale, medication(s)/side effects and non-pharmacologic comfort measures Outcome: Progressing   Problem: Health Behavior/Discharge Planning: Goal: Ability to manage health-related needs will improve Outcome: Progressing   Problem: Clinical Measurements: Goal: Respiratory complications will improve Outcome: Progressing Goal: Cardiovascular complication will be avoided Outcome: Progressing   Problem: Activity: Goal: Risk for activity intolerance will decrease Outcome: Progressing   Problem: Nutrition: Goal: Adequate nutrition will be maintained Outcome: Progressing   Problem: Safety: Goal: Ability to remain free from injury will improve Outcome: Progressing   Problem: Skin Integrity: Goal: Risk for impaired skin integrity will decrease Outcome: Progressing

## 2023-04-09 NOTE — Plan of Care (Signed)

## 2023-04-09 NOTE — Progress Notes (Signed)
Mobility Specialist Progress Note:   04/09/23 0915  Mobility  Activity Ambulated with assistance in hallway  Level of Assistance Standby assist, set-up cues, supervision of patient - no hands on  Assistive Device None  Distance Ambulated (ft) 250 ft  Activity Response Tolerated well  Mobility Referral Yes  Mobility visit 1 Mobility  Mobility Specialist Start Time (ACUTE ONLY) 0915  Mobility Specialist Stop Time (ACUTE ONLY) 0925  Mobility Specialist Time Calculation (min) (ACUTE ONLY) 10 min   Pt agreeable to mobility session. Required no physical assistance, only supervision for safety. No c/o throughout ambulation, pt back in bed with all needs met. VSS on RA.  Addison Lank Mobility Specialist Please contact via SecureChat or  Rehab office at [phone]

## 2023-04-10 DIAGNOSIS — I5020 Unspecified systolic (congestive) heart failure: Secondary | ICD-10-CM | POA: Diagnosis not present

## 2023-04-10 LAB — BASIC METABOLIC PANEL
Anion gap: 12 (ref 5–15)
BUN: 38 mg/dL — ABNORMAL HIGH (ref 8–23)
CO2: 28 mmol/L (ref 22–32)
Calcium: 9.1 mg/dL (ref 8.9–10.3)
Chloride: 99 mmol/L (ref 98–111)
Creatinine, Ser: 2.31 mg/dL — ABNORMAL HIGH (ref 0.61–1.24)
GFR, Estimated: 28 mL/min — ABNORMAL LOW (ref >60)
Glucose, Bld: 102 mg/dL — ABNORMAL HIGH (ref 70–99)
Potassium: 3.4 mmol/L — ABNORMAL LOW (ref 3.5–5.1)
Sodium: 139 mmol/L (ref 135–145)

## 2023-04-10 LAB — MAGNESIUM: Magnesium: 2.3 mg/dL (ref 1.7–2.4)

## 2023-04-10 MED ORDER — POTASSIUM CHLORIDE CRYS ER 20 MEQ PO TBCR
40.0000 meq | EXTENDED_RELEASE_TABLET | Freq: Once | ORAL | Status: AC
  Administered 2023-04-10: 40 meq via ORAL
  Filled 2023-04-10: qty 4

## 2023-04-10 NOTE — Progress Notes (Signed)
Progress Note  Patient Name: Dave Daniels Date of Encounter: 04/10/2023  Primary Cardiologist: Olga Millers, MD   Subjective   Patient seen and examined at his bedside. No complaints.  Inpatient Medications    Scheduled Meds:  apixaban  2.5 mg Oral BID   carvedilol  12.5 mg Oral BID WC   ezetimibe  10 mg Oral Daily   finasteride  5 mg Oral Daily   furosemide  80 mg Intravenous BID   hydrALAZINE  20 mg Oral TID   isosorbide mononitrate  60 mg Oral Daily   oxybutynin  10 mg Oral Daily   sodium chloride flush  3 mL Intravenous Q12H   tamsulosin  0.4 mg Oral Daily   Continuous Infusions:  PRN Meds: acetaminophen, colchicine, ondansetron (ZOFRAN) IV, sodium chloride flush   Vital Signs    Vitals:   04/09/23 2104 04/10/23 0022 04/10/23 0420 04/10/23 0705  BP: 122/67 132/80 137/74 137/74  Pulse:  60 64 66  Resp:  19 19   Temp:  97.9 F (36.6 C) 97.6 F (36.4 C)   TempSrc:  Oral Oral   SpO2:  97% 97%   Weight:   68.4 kg   Height:        Intake/Output Summary (Last 24 hours) at 04/10/2023 0942 Last data filed at 04/10/2023 0700 Gross per 24 hour  Intake 300 ml  Output 1775 ml  Net -1475 ml   Filed Weights   04/08/23 1540 04/09/23 0537 04/10/23 0420  Weight: 71.8 kg 71.8 kg 68.4 kg    Telemetry    Paced rhythm - Personally Reviewed  ECG    none - Personally Reviewed  Physical Exam    General: Comfortable Head: Atraumatic, normal size  Eyes: PEERLA, EOMI  Neck: Supple, normal JVD Cardiac: Normal S1, S2; RRR; no murmurs, rubs, or gallops Lungs: Clear to auscultation bilaterally Abd: Soft, nontender, no hepatomegaly  Ext: warm, no edema Musculoskeletal: No deformities, BUE and BLE strength normal and equal Skin: Warm and dry, no rashes   Neuro: Alert and oriented to person, place, time, and situation, CNII-XII grossly intact, no focal deficits  Psych: Normal mood and affect   Labs    Chemistry Recent Labs  Lab 04/08/23 1138  04/08/23 1202 04/09/23 0238 04/10/23 0230  NA 135  --  138 139  K 3.6  --  3.5 3.4*  CL 99  --  100 99  CO2 26  --  24 28  GLUCOSE 118*  --  92 102*  BUN 40*  --  38* 38*  CREATININE 2.20*  --  2.29* 2.31*  CALCIUM 9.0  --  9.1 9.1  PROT  --  6.4*  --   --   ALBUMIN  --  3.2*  --   --   AST  --  31  --   --   ALT  --  27  --   --   ALKPHOS  --  170*  --   --   BILITOT  --  1.8*  --   --   GFRNONAA 29*  --  28* 28*  ANIONGAP 10  --  14 12   Hematology Recent Labs  Lab 04/08/23 1138  WBC 5.4  RBC 4.57  HGB 12.8*  HCT 40.2  MCV 88.0  MCH 28.0  MCHC 31.8  RDW 14.7  PLT 209    Cardiac EnzymesNo results for input(s): "TROPONINI" in the last 168 hours. No results for input(s): "TROPIPOC" in  the last 168 hours.   BNP Recent Labs  Lab 04/08/23 1138  BNP >4,500.0*     DDimer No results for input(s): "DDIMER" in the last 168 hours.   Radiology    ECHOCARDIOGRAM COMPLETE Result Date: 04/09/2023    ECHOCARDIOGRAM REPORT   Patient Name:   [place] Service Date of Exam: 04/08/2023 Medical Rec #:  [id number]       Height:       67.0 in Accession #:    [id number]      Weight:       158.3 lb Date of Birth:  [birth year]       BSA:          1.831 m Patient Age:    82 years        BP:           142/82 mmHg Patient Gender: M               HR:           70 bpm. Exam Location:  Inpatient Procedure: 2D Echo, Cardiac Doppler, Color Doppler and Intracardiac            Opacification Agent Indications:    CHF- Acute Systolic I50.21  History:        Patient has prior history of Echocardiogram examinations, most                 recent 09/12/2021. Cardiomyopathy, CAD, Pacemaker, CKD, stage 3,                 Arrythmias:Atrial Fibrillation, Atrial Flutter and LBBB; Risk                 Factors:Hypertension and Dyslipidemia.  Sonographer:    Lucendia Herrlich RCS Referring Phys: [phone] RONDELL A SMITH IMPRESSIONS  1. Left ventricular ejection fraction, by estimation, is 20 to 25%. The left ventricle has  severely decreased function. The left ventricle demonstrates global hypokinesis. The left ventricular internal cavity size was moderately dilated. Left ventricular diastolic parameters are indeterminate.  2. Right ventricular systolic function is mildly reduced. The right ventricular size is mildly enlarged. There is moderately elevated pulmonary artery systolic pressure. The estimated right ventricular systolic pressure is 55.7 mmHg.  3. Left atrial size was moderately dilated.  4. Right atrial size was moderately dilated.  5. The mitral valve is normal in structure. Mild mitral valve regurgitation. No evidence of mitral stenosis.  6. The aortic valve is tricuspid. There is mild calcification of the aortic valve. Aortic valve regurgitation is moderate. Aortic valve sclerosis is present, with no evidence of aortic valve stenosis. Aortic regurgitation PHT measures 503 msec.  7. Aortic dilatation noted. There is mild dilatation of the ascending aorta, measuring 41 mm.  8. The inferior vena cava is dilated in size with <50% respiratory variability, suggesting right atrial pressure of 15 mmHg. Comparison(s): Prior images reviewed side by side. Conclusion(s)/Recommendation(s): No left ventricular mural or apical thrombus/thrombi. FINDINGS  Left Ventricle: Left ventricular ejection fraction, by estimation, is 20 to 25%. The left ventricle has severely decreased function. The left ventricle demonstrates global hypokinesis. Definity contrast agent was given IV to delineate the left ventricular endocardial borders. The left ventricular internal cavity size was moderately dilated. There is no left ventricular hypertrophy. Abnormal (paradoxical) septal motion, consistent with left bundle branch block. Left ventricular diastolic parameters are indeterminate. Right Ventricle: The right ventricular size is mildly enlarged. No increase in right ventricular wall thickness. Right ventricular systolic  function is mildly reduced.  There is moderately elevated pulmonary artery systolic pressure. The tricuspid regurgitant velocity is 3.19 m/s, and with an assumed right atrial pressure of 15 mmHg, the estimated right ventricular systolic pressure is 55.7 mmHg. Left Atrium: Left atrial size was moderately dilated. Right Atrium: Right atrial size was moderately dilated. Pericardium: There is no evidence of pericardial effusion. Mitral Valve: The mitral valve is normal in structure. Mild mitral valve regurgitation. No evidence of mitral valve stenosis. Tricuspid Valve: The tricuspid valve is normal in structure. Tricuspid valve regurgitation is mild . No evidence of tricuspid stenosis. Aortic Valve: The aortic valve is tricuspid. There is mild calcification of the aortic valve. Aortic valve regurgitation is moderate. Aortic regurgitation PHT measures 503 msec. Aortic valve sclerosis is present, with no evidence of aortic valve stenosis. Aortic valve peak gradient measures 5.8 mmHg. Pulmonic Valve: The pulmonic valve was normal in structure. Pulmonic valve regurgitation is mild. No evidence of pulmonic stenosis. Aorta: Aortic dilatation noted. There is mild dilatation of the ascending aorta, measuring 41 mm. Venous: The inferior vena cava is dilated in size with less than 50% respiratory variability, suggesting right atrial pressure of 15 mmHg. IAS/Shunts: No atrial level shunt detected by color flow Doppler. Additional Comments: A device lead is visualized in the right ventricle and right atrium.  LEFT VENTRICLE PLAX 2D LVIDd:         6.30 cm   Diastology LVIDs:         5.60 cm   LV e' medial:    3.23 cm/s LV PW:         0.90 cm   LV E/e' medial:  29.3 LV IVS:        0.50 cm   LV e' lateral:   3.23 cm/s LVOT diam:     1.90 cm   LV E/e' lateral: 29.3 LV SV:         52 LV SV Index:   29 LVOT Area:     2.84 cm  RIGHT VENTRICLE            IVC RV S prime:     9.08 cm/s  IVC diam: 2.40 cm TAPSE (M-mode): 1.1 cm LEFT ATRIUM             Index        RIGHT  ATRIUM           Index LA diam:        4.50 cm 2.46 cm/m   RA Area:     23.00 cm LA Vol (A2C):   63.7 ml 34.79 ml/m  RA Volume:   78.20 ml  42.71 ml/m LA Vol (A4C):   58.9 ml 32.17 ml/m LA Biplane Vol: 62.0 ml 33.86 ml/m  AORTIC VALVE                 PULMONIC VALVE AV Area (Vmax): 2.49 cm     PR End Diast Vel: 9.42 msec AV Vmax:        120.50 cm/s AV Peak Grad:   5.8 mmHg LVOT Vmax:      106.00 cm/s LVOT Vmean:     62.800 cm/s LVOT VTI:       0.185 m AI PHT:         503 msec  AORTA Ao Root diam: 3.30 cm Ao Asc diam:  4.10 cm MITRAL VALVE               TRICUSPID VALVE MV Area (PHT): 3.99  cm    TR Peak grad:   40.7 mmHg MV Decel Time: 190 msec    TR Vmax:        319.00 cm/s MR Peak grad: 41.4 mmHg MR Vmax:      321.67 cm/s  SHUNTS MV E velocity: 94.80 cm/s  Systemic VTI:  0.18 m MV A velocity: 71.10 cm/s  Systemic Diam: 1.90 cm MV E/A ratio:  1.33 Donato Schultz MD Electronically signed by Donato Schultz MD Signature Date/Time: 04/09/2023/6:57:59 AM    Final    DG Chest 2 View Result Date: 04/08/2023 CLINICAL DATA:  Shortness of breath. Bilateral lower extremity swelling. EXAM: CHEST - 2 VIEW COMPARISON:  Chest radiograph dated March 30, 2023. FINDINGS: Stable cardiomegaly with pulmonary vascular congestion. Stable left-sided pacemaker in place. Prior median sternotomy. Increased size of a small right pleural effusion with increased patchy opacity at the right lung base. No pneumothorax. No acute osseous abnormality. IMPRESSION: 1. Increased size of a small right pleural effusion and patchy opacity at the right lung base, which could represent atelectasis or infiltrate. 2. Cardiomegaly with pulmonary vascular congestion. Electronically Signed   By: Hart Robinsons M.D.   On: 04/08/2023 12:18   Cardiac Studies    Patient Profile     82 y.o. male  with a hx of CAD s/p CABG 06/2010, ICM s/p Abbott BiV ICD (12/12, gen change 05/08/2022), apical thrombus, persistent afib on Eliquis, HTN, HLD, LBBB and CKD  stage IV admitted for acute on chronic systolic heart failure.  Assessment & Plan  Acute on chronic systolic heart failure Coronary artery disease status post CABG Ischemic cardiomyopathy status post biventricular ICD Apical thrombus Persistent atrial fibrillation Hypertension Hyperlipidemia Stage IV CKD  -1475 milliliters output.  Would benefit from still being on IV diuretics so continue this for now.  EF yesterday stable compared to June 2023 20 to 25%.  No need for any further workup at this time.  In terms of the atrial fibrillation continue current regimen with carvedilol and apixaban maintaining sinus/V-paced.  Blood pressure is appropriate at this time no need to adjust antihypertensive medications. Due to avoid nephrotoxins. No anginal symptoms at this time. Has tolerated the isosorbide mononitrate increased.     For questions or updates, please contact [facility] Please consult [email or web address] for contact info under Cardiology/STEMI.     SignedThomasene Ripple, DO  04/10/2023, 9:42 AM

## 2023-04-10 NOTE — Plan of Care (Signed)

## 2023-04-10 NOTE — Plan of Care (Signed)
  Problem: Education: Goal: Knowledge of General Education information will improve Description: Including pain rating scale, medication(s)/side effects and non-pharmacologic comfort measures Outcome: Progressing   Problem: Health Behavior/Discharge Planning: Goal: Ability to manage health-related needs will improve Outcome: Progressing   Problem: Clinical Measurements: Goal: Will remain free from infection Outcome: Progressing Goal: Respiratory complications will improve Outcome: Progressing   Problem: Activity: Goal: Risk for activity intolerance will decrease Outcome: Progressing   Problem: Nutrition: Goal: Adequate nutrition will be maintained Outcome: Progressing   Problem: Coping: Goal: Level of anxiety will decrease Outcome: Progressing   Problem: Safety: Goal: Ability to remain free from injury will improve Outcome: Progressing

## 2023-04-10 NOTE — Progress Notes (Signed)
PROGRESS NOTE    Dave Daniels  ZOX:[id number] DOB: 1941-04-13 DOA: 04/08/2023 PCP: Cheron Schaumann., MD  Chief Complaint  Patient presents with   Leg Swelling   Shortness of Breath    Brief Narrative:   Dave Daniels is Dave Daniels 82 y.o. male with medical history significant of HTN, HLD, atrial fibrillation/flutter, CAD s/p PCI and CABG, HFrEF, s/p ICD CKD stage IV who presents with progressively worsening shortness of breath over the last 2 weeks.  During this time he has had lower extremity edema extending from the feet to the knees. The patient reports associated shortness of breath and fatigue, particularly noticeable during even minor physical exertion, necessitating frequent rest periods. There is no associated chest discomfort. The patient occasionally experiences discomfort when lying flat, requiring him to sit up for relief.   The patient's weight has been fluctuating between 153 and 161 pounds, with the most recent weight recorded at home being 154 pounds. Despite an increase in Lasix dosage following Landrie Beale recent emergency department visit, the patient reports no significant improvement in symptoms.The patient denies any cough or abdominal distention, but notes persistent lower extremity swelling. The patient is under the care of Dr. Maryclare Bean of nephrology.   Records note patient had underwent successful course ablation of atrial fibrillation on 10/31 and prior to that underwent generator change out with Dr. Dr. Elberta Fortis for his ICD on 1/26.   In the emergency department patient was noted to be afebrile with pulse 54-68, and all other vital signs maintained.  Labs notable for BNP elevated greater than 4500.  Chest x-ray noted increased size and pleural effusion,  patchy opacities in the right lung base, and cardiomegaly with pulmonary vascular congestion.  Last echocardiogram noted EF to be 20 to 25% with grade 3 diastolic dysfunction back in 2023.Patient had been given Lasix 40 mg IV x  1 dose. Accepted as inpatient to [facility] cardiac telemetry bed.   Assessment & Plan:   Principal Problem:   Heart failure with reduced ejection fraction (HCC) Active Problems:   Paroxysmal atrial fibrillation (HCC)   Hypertension   Cardiac defibrillator in situ   Hyperlipidemia   CKD (chronic kidney disease), stage IV (HCC)   BPH with obstruction/lower urinary tract symptoms  Heart failure with reduced ejection fraction with exacerbation -echo with EF 20-25%, global hypokinesis, moderately elevated PASP -will continue lasix -strict I/O, daily weights -appreciate cardiology assistance    Paroxysmal atrial fibrillation on chronic anticoagulation -Continue Eliquis   Essential hypertension Blood pressures are currently maintained. -Continue Coreg, hydralazine, isosorbide mononitrate   S/p AICD Patient had his AICD generator changed out on 05/08/2022 by Dr. Elberta Fortis.   Hyperlipidemia Patient is on Repatha and Zetia -Continue Zetia -Plan to resume Repatha in outpatient setting   Chronic kidney disease stage IV On admission creatinine 2.2 with BUN 40.  Baseline creatinine 2.2-2.8. -Continue to monitor kidney function with diuresis   BPH -Continue finasteride and tamsulosin    DVT prophylaxis: eliquis Code Status: full Family Communication: none Disposition:   Status is: Inpatient Remains inpatient appropriate because: need for inptient care   Consultants:  card  Procedures:  Echo IMPRESSIONS     1. Left ventricular ejection fraction, by estimation, is 20 to 25%. The  left ventricle has severely decreased function. The left ventricle  demonstrates global hypokinesis. The left ventricular internal cavity size  was moderately dilated. Left  ventricular diastolic parameters are indeterminate.   2. Right ventricular systolic function is mildly reduced. The  right  ventricular size is mildly enlarged. There is moderately elevated  pulmonary artery systolic pressure. The  estimated right ventricular  systolic pressure is 55.7 mmHg.   3. Left atrial size was moderately dilated.   4. Right atrial size was moderately dilated.   5. The mitral valve is normal in structure. Mild mitral valve  regurgitation. No evidence of mitral stenosis.   6. The aortic valve is tricuspid. There is mild calcification of the  aortic valve. Aortic valve regurgitation is moderate. Aortic valve  sclerosis is present, with no evidence of aortic valve stenosis. Aortic  regurgitation PHT measures 503 msec.   7. Aortic dilatation noted. There is mild dilatation of the ascending  aorta, measuring 41 mm.   8. The inferior vena cava is dilated in size with <50% respiratory  variability, suggesting right atrial pressure of 15 mmHg.   Comparison(s): Prior images reviewed side by side.   Conclusion(s)/Recommendation(s): No left ventricular mural or apical  thrombus/thrombi.   Antimicrobials:  Anti-infectives (From admission, onward)    None       Subjective: Still SOB with exertion, otherwise somewhat better  Objective: Vitals:   04/10/23 0420 04/10/23 0705 04/10/23 1156 04/10/23 1500  BP: 137/74 137/74 131/76 133/79  Pulse: 64 66 60 60  Resp: 19  20   Temp: 97.6 F (36.4 C)  98.7 F (37.1 C) 98.4 F (36.9 C)  TempSrc: Oral  Oral Oral  SpO2: 97%  100% 99%  Weight: 68.4 kg     Height:        Intake/Output Summary (Last 24 hours) at 04/10/2023 1632 Last data filed at 04/10/2023 1500 Gross per 24 hour  Intake 300 ml  Output 2575 ml  Net -2275 ml   Filed Weights   04/08/23 1540 04/09/23 0537 04/10/23 0420  Weight: 71.8 kg 71.8 kg 68.4 kg    Examination:  General: No acute distress. Cardiovascular: RRR Lungs: unlabored, CTAB Neurological: Alert and oriented 3. Moves all extremities 4 with equal strength. Cranial nerves II through XII grossly intact. Skin: Warm and dry. No rashes or lesions. Extremities: bilateral LE edema, overall improved   Data  Reviewed: I have personally reviewed following labs and imaging studies  CBC: Recent Labs  Lab 04/08/23 1138  WBC 5.4  HGB 12.8*  HCT 40.2  MCV 88.0  PLT 209    Basic Metabolic Panel: Recent Labs  Lab 04/08/23 1138 04/09/23 0238 04/10/23 0230  NA 135 138 139  K 3.6 3.5 3.4*  CL 99 100 99  CO2 26 24 28   GLUCOSE 118* 92 102*  BUN 40* 38* 38*  CREATININE 2.20* 2.29* 2.31*  CALCIUM 9.0 9.1 9.1  MG  --   --  2.3    GFR: Estimated Creatinine Clearance: 23.1 mL/min (Leighton Luster) (by C-G formula based on SCr of 2.31 mg/dL (H)).  Liver Function Tests: Recent Labs  Lab 04/08/23 1202  AST 31  ALT 27  ALKPHOS 170*  BILITOT 1.8*  PROT 6.4*  ALBUMIN 3.2*    CBG: No results for input(s): "GLUCAP" in the last 168 hours.   Recent Results (from the past 240 hours)  Resp panel by RT-PCR (RSV, Flu Renu Asby&B, Covid) Anterior Nasal Swab     Status: None   Collection Time: 04/08/23 11:22 AM   Specimen: Anterior Nasal Swab  Result Value Ref Range Status   SARS Coronavirus 2 by RT PCR NEGATIVE NEGATIVE Final    Comment: (NOTE) SARS-CoV-2 target nucleic acids are NOT DETECTED.  The  SARS-CoV-2 RNA is generally detectable in upper respiratory specimens during the acute phase of infection. The lowest concentration of SARS-CoV-2 viral copies this assay can detect is 138 copies/mL. Natania Finigan negative result does not preclude SARS-Cov-2 infection and should not be used as the sole basis for treatment or other patient management decisions. Mikie Misner negative result may occur with  improper specimen collection/handling, submission of specimen other than nasopharyngeal swab, presence of viral mutation(s) within the areas targeted by this assay, and inadequate number of viral copies(<138 copies/mL). Itay Mella negative result must be combined with clinical observations, patient history, and epidemiological information. The expected result is Negative.  Fact Sheet for Patients:   BloggerCourse.com  Fact Sheet for Healthcare Providers:  SeriousBroker.it  This test is no t yet approved or cleared by the [place] FDA and  has been authorized for detection and/or diagnosis of SARS-CoV-2 by FDA under an Emergency Use Authorization (EUA). This EUA will remain  in effect (meaning this test can be used) for the duration of the COVID-19 declaration under Section 564(b)(1) of the Act, 21 U.S.C.section 360bbb-3(b)(1), unless the authorization is terminated  or revoked sooner.       Influenza Anett Ranker by PCR NEGATIVE NEGATIVE Final   Influenza B by PCR NEGATIVE NEGATIVE Final    Comment: (NOTE) The Xpert Xpress SARS-CoV-2/FLU/RSV plus assay is intended as an aid in the diagnosis of influenza from Nasopharyngeal swab specimens and should not be used as Khayree Delellis sole basis for treatment. Nasal washings and aspirates are unacceptable for Xpert Xpress SARS-CoV-2/FLU/RSV testing.  Fact Sheet for Patients: BloggerCourse.com  Fact Sheet for Healthcare Providers: SeriousBroker.it  This test is not yet approved or cleared by the [place] FDA and has been authorized for detection and/or diagnosis of SARS-CoV-2 by FDA under an Emergency Use Authorization (EUA). This EUA will remain in effect (meaning this test can be used) for the duration of the COVID-19 declaration under Section 564(b)(1) of the Act, 21 U.S.C. section 360bbb-3(b)(1), unless the authorization is terminated or revoked.     Resp Syncytial Virus by PCR NEGATIVE NEGATIVE Final    Comment: (NOTE) Fact Sheet for Patients: BloggerCourse.com  Fact Sheet for Healthcare Providers: SeriousBroker.it  This test is not yet approved or cleared by the [place] FDA and has been authorized for detection and/or diagnosis of SARS-CoV-2 by FDA under an Emergency Use  Authorization (EUA). This EUA will remain in effect (meaning this test can be used) for the duration of the COVID-19 declaration under Section 564(b)(1) of the Act, 21 U.S.C. section 360bbb-3(b)(1), unless the authorization is terminated or revoked.  Performed at [facility], [address], [place], [place] [address]          Radiology Studies: ECHOCARDIOGRAM COMPLETE Result Date: 04/09/2023    ECHOCARDIOGRAM REPORT   Patient Name:   [place] Service Date of Exam: 04/08/2023 Medical Rec #:  [id number]       Height:       67.0 in Accession #:    [id number]      Weight:       158.3 lb Date of Birth:  February 04, 1941       BSA:          1.831 m Patient Age:    82 years        BP:           142/82 mmHg Patient Gender: M               HR:  70 bpm. Exam Location:  Inpatient Procedure: 2D Echo, Cardiac Doppler, Color Doppler and Intracardiac            Opacification Agent Indications:    CHF- Acute Systolic I50.21  History:        Patient has prior history of Echocardiogram examinations, most                 recent 09/12/2021. Cardiomyopathy, CAD, Pacemaker, CKD, stage 3,                 Arrythmias:Atrial Fibrillation, Atrial Flutter and LBBB; Risk                 Factors:Hypertension and Dyslipidemia.  Sonographer:    Lucendia Herrlich RCS Referring Phys: [phone] RONDELL Shane Melby SMITH IMPRESSIONS  1. Left ventricular ejection fraction, by estimation, is 20 to 25%. The left ventricle has severely decreased function. The left ventricle demonstrates global hypokinesis. The left ventricular internal cavity size was moderately dilated. Left ventricular diastolic parameters are indeterminate.  2. Right ventricular systolic function is mildly reduced. The right ventricular size is mildly enlarged. There is moderately elevated pulmonary artery systolic pressure. The estimated right ventricular systolic pressure is 55.7 mmHg.  3. Left atrial size was moderately dilated.  4. Right atrial size was moderately  dilated.  5. The mitral valve is normal in structure. Mild mitral valve regurgitation. No evidence of mitral stenosis.  6. The aortic valve is tricuspid. There is mild calcification of the aortic valve. Aortic valve regurgitation is moderate. Aortic valve sclerosis is present, with no evidence of aortic valve stenosis. Aortic regurgitation PHT measures 503 msec.  7. Aortic dilatation noted. There is mild dilatation of the ascending aorta, measuring 41 mm.  8. The inferior vena cava is dilated in size with <50% respiratory variability, suggesting right atrial pressure of 15 mmHg. Comparison(s): Prior images reviewed side by side. Conclusion(s)/Recommendation(s): No left ventricular mural or apical thrombus/thrombi. FINDINGS  Left Ventricle: Left ventricular ejection fraction, by estimation, is 20 to 25%. The left ventricle has severely decreased function. The left ventricle demonstrates global hypokinesis. Definity contrast agent was given IV to delineate the left ventricular endocardial borders. The left ventricular internal cavity size was moderately dilated. There is no left ventricular hypertrophy. Abnormal (paradoxical) septal motion, consistent with left bundle branch block. Left ventricular diastolic parameters are indeterminate. Right Ventricle: The right ventricular size is mildly enlarged. No increase in right ventricular wall thickness. Right ventricular systolic function is mildly reduced. There is moderately elevated pulmonary artery systolic pressure. The tricuspid regurgitant velocity is 3.19 m/s, and with an assumed right atrial pressure of 15 mmHg, the estimated right ventricular systolic pressure is 55.7 mmHg. Left Atrium: Left atrial size was moderately dilated. Right Atrium: Right atrial size was moderately dilated. Pericardium: There is no evidence of pericardial effusion. Mitral Valve: The mitral valve is normal in structure. Mild mitral valve regurgitation. No evidence of mitral valve stenosis.  Tricuspid Valve: The tricuspid valve is normal in structure. Tricuspid valve regurgitation is mild . No evidence of tricuspid stenosis. Aortic Valve: The aortic valve is tricuspid. There is mild calcification of the aortic valve. Aortic valve regurgitation is moderate. Aortic regurgitation PHT measures 503 msec. Aortic valve sclerosis is present, with no evidence of aortic valve stenosis. Aortic valve peak gradient measures 5.8 mmHg. Pulmonic Valve: The pulmonic valve was normal in structure. Pulmonic valve regurgitation is mild. No evidence of pulmonic stenosis. Aorta: Aortic dilatation noted. There is mild dilatation of the ascending aorta,  measuring 41 mm. Venous: The inferior vena cava is dilated in size with less than 50% respiratory variability, suggesting right atrial pressure of 15 mmHg. IAS/Shunts: No atrial level shunt detected by color flow Doppler. Additional Comments: Harshika Mago device lead is visualized in the right ventricle and right atrium.  LEFT VENTRICLE PLAX 2D LVIDd:         6.30 cm   Diastology LVIDs:         5.60 cm   LV e' medial:    3.23 cm/s LV PW:         0.90 cm   LV E/e' medial:  29.3 LV IVS:        0.50 cm   LV e' lateral:   3.23 cm/s LVOT diam:     1.90 cm   LV E/e' lateral: 29.3 LV SV:         52 LV SV Index:   29 LVOT Area:     2.84 cm  RIGHT VENTRICLE            IVC RV S prime:     9.08 cm/s  IVC diam: 2.40 cm TAPSE (M-mode): 1.1 cm LEFT ATRIUM             Index        RIGHT ATRIUM           Index LA diam:        4.50 cm 2.46 cm/m   RA Area:     23.00 cm LA Vol (A2C):   63.7 ml 34.79 ml/m  RA Volume:   78.20 ml  42.71 ml/m LA Vol (A4C):   58.9 ml 32.17 ml/m LA Biplane Vol: 62.0 ml 33.86 ml/m  AORTIC VALVE                 PULMONIC VALVE AV Area (Vmax): 2.49 cm     PR End Diast Vel: 9.42 msec AV Vmax:        120.50 cm/s AV Peak Grad:   5.8 mmHg LVOT Vmax:      106.00 cm/s LVOT Vmean:     62.800 cm/s LVOT VTI:       0.185 m AI PHT:         503 msec  AORTA Ao Root diam: 3.30 cm Ao Asc  diam:  4.10 cm MITRAL VALVE               TRICUSPID VALVE MV Area (PHT): 3.99 cm    TR Peak grad:   40.7 mmHg MV Decel Time: 190 msec    TR Vmax:        319.00 cm/s MR Peak grad: 41.4 mmHg MR Vmax:      321.67 cm/s  SHUNTS MV E velocity: 94.80 cm/s  Systemic VTI:  0.18 m MV Tanina Barb velocity: 71.10 cm/s  Systemic Diam: 1.90 cm MV E/Keano Guggenheim ratio:  1.33 Donato Schultz MD Electronically signed by Donato Schultz MD Signature Date/Time: 04/09/2023/6:57:59 AM    Final         Scheduled Meds:  apixaban  2.5 mg Oral BID   carvedilol  12.5 mg Oral BID WC   ezetimibe  10 mg Oral Daily   finasteride  5 mg Oral Daily   furosemide  80 mg Intravenous BID   hydrALAZINE  20 mg Oral TID   isosorbide mononitrate  60 mg Oral Daily   oxybutynin  10 mg Oral Daily   sodium chloride flush  3 mL Intravenous Q12H   tamsulosin  0.4 mg Oral Daily   Continuous Infusions:   LOS: 2 days    Time spent: over 30 min    Lacretia Nicks, MD [facility]   To contact the attending provider between 7A-7P or the covering provider during after hours 7P-7A, please log into the web site [email or web address] and access using universal [place] password for that web site. If you do not have the password, please call the hospital operator.  04/10/2023, 4:32 PM

## 2023-04-11 DIAGNOSIS — I5020 Unspecified systolic (congestive) heart failure: Secondary | ICD-10-CM | POA: Diagnosis not present

## 2023-04-11 LAB — BASIC METABOLIC PANEL
Anion gap: 13 (ref 5–15)
BUN: 40 mg/dL — ABNORMAL HIGH (ref 8–23)
CO2: 28 mmol/L (ref 22–32)
Calcium: 8.9 mg/dL (ref 8.9–10.3)
Chloride: 97 mmol/L — ABNORMAL LOW (ref 98–111)
Creatinine, Ser: 2.19 mg/dL — ABNORMAL HIGH (ref 0.61–1.24)
GFR, Estimated: 29 mL/min — ABNORMAL LOW (ref >60)
Glucose, Bld: 98 mg/dL (ref 70–99)
Potassium: 3.3 mmol/L — ABNORMAL LOW (ref 3.5–5.1)
Sodium: 138 mmol/L (ref 135–145)

## 2023-04-11 MED ORDER — TORSEMIDE 20 MG PO TABS: 20.0000 mg | ORAL_TABLET | Freq: Two times a day (BID) | ORAL | 1 refills | Status: DC

## 2023-04-11 MED ORDER — HYDRALAZINE HCL 25 MG PO TABS: 25.0000 mg | ORAL_TABLET | Freq: Three times a day (TID) | ORAL | 1 refills | Status: DC

## 2023-04-11 MED ORDER — POTASSIUM CHLORIDE CRYS ER 20 MEQ PO TBCR
40.0000 meq | EXTENDED_RELEASE_TABLET | Freq: Once | ORAL | Status: AC
  Administered 2023-04-11: 40 meq via ORAL
  Filled 2023-04-11: qty 2

## 2023-04-11 MED ORDER — ISOSORBIDE MONONITRATE ER 30 MG PO TB24: 60.0000 mg | ORAL_TABLET | Freq: Every day | ORAL | 1 refills | Status: DC

## 2023-04-11 MED ORDER — POTASSIUM CHLORIDE CRYS ER 20 MEQ PO TBCR: 40.0000 meq | EXTENDED_RELEASE_TABLET | Freq: Every day | ORAL | 1 refills | Status: DC

## 2023-04-11 MED ORDER — TORSEMIDE 20 MG PO TABS: 20.0000 mg | ORAL_TABLET | Freq: Two times a day (BID) | ORAL | Status: DC

## 2023-04-11 NOTE — Plan of Care (Signed)

## 2023-04-11 NOTE — Progress Notes (Signed)
Progress Note  Patient Name: Dave Daniels Date of Encounter: 04/11/2023  Primary Cardiologist: Olga Millers, MD   Subjective   Patient seen and examined at his bedside. No complaints.  Inpatient Medications    Scheduled Meds:  apixaban  2.5 mg Oral BID   carvedilol  12.5 mg Oral BID WC   ezetimibe  10 mg Oral Daily   finasteride  5 mg Oral Daily   furosemide  80 mg Intravenous BID   hydrALAZINE  20 mg Oral TID   isosorbide mononitrate  60 mg Oral Daily   oxybutynin  10 mg Oral Daily   sodium chloride flush  3 mL Intravenous Q12H   tamsulosin  0.4 mg Oral Daily   Continuous Infusions:  PRN Meds: acetaminophen, colchicine, ondansetron (ZOFRAN) IV, sodium chloride flush   Vital Signs    Vitals:   04/10/23 2101 04/11/23 0104 04/11/23 0445 04/11/23 0828  BP: 131/72 137/74 (!) 147/77 128/69  Pulse: 60 61 62 61  Resp: 17 18 18 20   Temp: 98.3 F (36.8 C) 98.2 F (36.8 C) 98.1 F (36.7 C) 97.7 F (36.5 C)  TempSrc: Oral Oral Oral Oral  SpO2: 97% 96% 99% 97%  Weight:   66.4 kg   Height:        Intake/Output Summary (Last 24 hours) at 04/11/2023 0856 Last data filed at 04/11/2023 0842 Gross per 24 hour  Intake 600 ml  Output 2650 ml  Net -2050 ml   Filed Weights   04/09/23 0537 04/10/23 0420 04/11/23 0445  Weight: 71.8 kg 68.4 kg 66.4 kg    Telemetry    Paced rhythm - Personally Reviewed  ECG    none - Personally Reviewed  Physical Exam    General: Comfortable Head: Atraumatic, normal size  Eyes: PEERLA, EOMI  Neck: Supple, normal JVD Cardiac: Normal S1, S2; RRR; no murmurs, rubs, or gallops Lungs: Clear to auscultation bilaterally Abd: Soft, nontender, no hepatomegaly  Ext: warm, no edema Musculoskeletal: No deformities, BUE and BLE strength normal and equal Skin: Warm and dry, no rashes   Neuro: Alert and oriented to person, place, time, and situation, CNII-XII grossly intact, no focal deficits  Psych: Normal mood and affect   Labs     Chemistry Recent Labs  Lab 04/08/23 1202 04/09/23 0238 04/10/23 0230 04/11/23 0205  NA  --  138 139 138  K  --  3.5 3.4* 3.3*  CL  --  100 99 97*  CO2  --  24 28 28   GLUCOSE  --  92 102* 98  BUN  --  38* 38* 40*  CREATININE  --  2.29* 2.31* 2.19*  CALCIUM  --  9.1 9.1 8.9  PROT 6.4*  --   --   --   ALBUMIN 3.2*  --   --   --   AST 31  --   --   --   ALT 27  --   --   --   ALKPHOS 170*  --   --   --   BILITOT 1.8*  --   --   --   GFRNONAA  --  28* 28* 29*  ANIONGAP  --  14 12 13    Hematology Recent Labs  Lab 04/08/23 1138  WBC 5.4  RBC 4.57  HGB 12.8*  HCT 40.2  MCV 88.0  MCH 28.0  MCHC 31.8  RDW 14.7  PLT 209    Cardiac EnzymesNo results for input(s): "TROPONINI" in the last 168  hours. No results for input(s): "TROPIPOC" in the last 168 hours.   BNP Recent Labs  Lab 04/08/23 1138  BNP >4,500.0*     DDimer No results for input(s): "DDIMER" in the last 168 hours.   Radiology    No results found.  Cardiac Studies    Patient Profile     82 y.o. male  with a hx of CAD s/p CABG 06/2010, ICM s/p Abbott BiV ICD (12/12, gen change 05/08/2022), apical thrombus, persistent afib on Eliquis, HTN, HLD, LBBB and CKD stage IV admitted for acute on chronic systolic heart failure.  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  Weight has improved. We can transition him to torsemide 20 mg BID.  EF yesterday stable compared to June 2023 20 to 25%.  No need for any further workup at this time.  In terms of the atrial fibrillation continue current regimen with carvedilol and apixaban maintaining sinus/V-paced.  Blood pressure is appropriate at this time no need to adjust antihypertensive medications.  Due to avoid  nephrotoxins. Cr stable   No anginal symptoms at this time. Has tolerated the isosorbide mononitrate increased.    From a CV standpoint he can be discharge to home.   For questions or updates, please contact [facility] Please consult [email or web address] for contact info under Cardiology/STEMI.     Signed, Thomasene Ripple, DO  04/11/2023, 8:56 AM

## 2023-04-11 NOTE — Discharge Summary (Signed)
Physician Discharge Summary  Dave Daniels MVH:[id number] DOB: [birth year] DOA: 04/08/2023  PCP: Cheron Schaumann., MD  Admit date: 04/08/2023 Discharge date: 04/11/2023  Time spent: 45 minutes  Recommendations for Outpatient Follow-up:  Cardiology  Dr. Jens Som in 2 weeks, please check BMP in 1 week Nephrology Dr. Conni Elliot in 1 month   Discharge Diagnoses:  Principal Problem:   Heart failure with reduced ejection fraction [facility]) Active Problems:   Paroxysmal atrial fibrillation (HCC)   Hypertension   Cardiac defibrillator in situ   Hyperlipidemia   CKD (chronic kidney disease), stage IV (HCC)   BPH    Discharge Condition: Improved  Diet recommendation: Low sodium, heart healthy  Filed Weights   04/09/23 0537 04/10/23 0420 04/11/23 0445  Weight: 71.8 kg 68.4 kg 66.4 kg    History of present illness:  82 y.o. male with medical history significant of HTN, HLD, atrial fibrillation/flutter, CAD s/p PCI and CABG, HFrEF, s/p ICD CKD stage IV who presents with progressively worsening shortness of breath over the last 2 weeks.  During this time he has had lower extremity edema extending from the feet to the knees. The patient reports associated shortness of breath and fatigue   Hospital Course:   Acute on chronic systolic CHF  -echo with EF 20-25%, global hypokinesis, moderately elevated PASP -EF largely unchanged from prior -Seen by cardiology in consultation, diuresed with IV Lasix, weight down 8 LB -GDMT limited by CKD 4 -Per cardiology recommendations transition to oral torsemide 20 Mg twice daily with KCl, needs repeat labs in 1 week, follow-up with cardiology Dr. Jens Som   Paroxysmal atrial fibrillation on chronic anticoagulation -Continue Eliquis, Coreg -Per cards this is persistent A-fib,   Essential hypertension Blood pressures are currently maintained. -Continue Coreg, hydralazine, isosorbide mononitrate, dose increased   S/p AICD Patient had his AICD  generator changed out on 05/08/2022 by Dr. Elberta Fortis.   Hyperlipidemia Patient is on Repatha and Zetia -Continue Zetia -Plan to resume Repatha in outpatient setting   Chronic kidney disease stage IV On admission creatinine 2.2 with BUN 40.  Baseline creatinine 2.2-2.8. -Stable, now on torsemide -Followed by nephrology Dr.Nwoba   BPH -Continue finasteride and tamsulosin  Discharge Exam: Vitals:   04/11/23 0445 04/11/23 0828  BP: (!) 147/77 128/69  Pulse: 62 61  Resp: 18 20  Temp: 98.1 F (36.7 C) 97.7 F (36.5 C)  SpO2: 99% 97%   Gen: Awake, Alert, Oriented X 3,  HEENT: no JVD Lungs: Good air movement bilaterally, CTAB CVS: S1S2/RRR Abd: soft, Non tender, non distended, BS present Extremities: trace edema Skin: no new rashes on exposed skin   Discharge Instructions   Discharge Instructions     Diet - low sodium heart healthy   Complete by: As directed    Increase activity slowly   Complete by: As directed       Allergies as of 04/11/2023       Reactions   Statins Hives   Muscle pain & severe hives   Nsaids Other (See Comments)   Renal insufficiency   Tolmetin Other (See Comments)   Renal insufficiency   Atorvastatin Rash   Pravastatin Rash   rash   Shellfish Allergy Rash        Medication List     STOP taking these medications    furosemide 20 MG tablet Commonly known as: LASIX       TAKE these medications    acetaminophen 500 MG tablet Commonly known as: TYLENOL Take 1,000 mg  by mouth as needed for moderate pain or headache.   allopurinol 300 MG tablet Commonly known as: ZYLOPRIM Take 300 mg by mouth daily as needed (Gout).   apixaban 2.5 MG Tabs tablet Commonly known as: ELIQUIS Take 1 tablet (2.5 mg total) by mouth 2 (two) times daily.   carvedilol 12.5 MG tablet Commonly known as: COREG TAKE 1 TABLET BY MOUTH TWICE DAILY WITH A MEAL   cholecalciferol 25 MCG (1000 UNIT) tablet Commonly known as: VITAMIN D3 Take 1,000 Units  by mouth daily.   colchicine 0.6 MG tablet Take 0.6 mg by mouth as needed (Gout).   ezetimibe 10 MG tablet Commonly known as: ZETIA Take 1 tablet (10 mg total) by mouth daily.   finasteride 5 MG tablet Commonly known as: PROSCAR Take 5 mg by mouth daily.   hydrALAZINE 25 MG tablet Commonly known as: APRESOLINE Take 1 tablet (25 mg total) by mouth 3 (three) times daily. What changed:  medication strength how much to take   isosorbide mononitrate 30 MG 24 hr tablet Commonly known as: IMDUR Take 2 tablets (60 mg total) by mouth daily. What changed: how much to take   oxybutynin 10 MG 24 hr tablet Commonly known as: DITROPAN-XL Take 10 mg by mouth daily.   potassium chloride SA 20 MEQ tablet Commonly known as: KLOR-CON M Take 2 tablets (40 mEq total) by mouth daily.   Repatha SureClick 140 MG/ML Soaj Generic drug: Evolocumab Inject 140 mg into the skin every 14 (fourteen) days.   tamsulosin 0.4 MG Caps capsule Commonly known as: FLOMAX Take 0.4 mg by mouth daily.   torsemide 20 MG tablet Commonly known as: DEMADEX Take 1 tablet (20 mg total) by mouth 2 (two) times daily.       Allergies  Allergen Reactions   Statins Hives    Muscle pain & severe hives   Nsaids Other (See Comments)    Renal insufficiency     Tolmetin Other (See Comments)    Renal insufficiency   Atorvastatin Rash   Pravastatin Rash    rash   Shellfish Allergy Rash      The results of significant diagnostics from this hospitalization (including imaging, microbiology, ancillary and laboratory) are listed below for reference.    Significant Diagnostic Studies: ECHOCARDIOGRAM COMPLETE Result Date: 04/09/2023    ECHOCARDIOGRAM REPORT   Patient Name:   [place] Service Date of Exam: 04/08/2023 Medical Rec #:  [id number]       Height:       67.0 in Accession #:    [id number]      Weight:       158.3 lb Date of Birth:  01/16/1941       BSA:          1.831 m Patient Age:    82 years        BP:            142/82 mmHg Patient Gender: M               HR:           70 bpm. Exam Location:  Inpatient Procedure: 2D Echo, Cardiac Doppler, Color Doppler and Intracardiac            Opacification Agent Indications:    CHF- Acute Systolic I50.21  History:        Patient has prior history of Echocardiogram examinations, most  recent 09/12/2021. Cardiomyopathy, CAD, Pacemaker, CKD, stage 3,                 Arrythmias:Atrial Fibrillation, Atrial Flutter and LBBB; Risk                 Factors:Hypertension and Dyslipidemia.  Sonographer:    Lucendia Herrlich RCS Referring Phys: [phone] RONDELL A SMITH IMPRESSIONS  1. Left ventricular ejection fraction, by estimation, is 20 to 25%. The left ventricle has severely decreased function. The left ventricle demonstrates global hypokinesis. The left ventricular internal cavity size was moderately dilated. Left ventricular diastolic parameters are indeterminate.  2. Right ventricular systolic function is mildly reduced. The right ventricular size is mildly enlarged. There is moderately elevated pulmonary artery systolic pressure. The estimated right ventricular systolic pressure is 55.7 mmHg.  3. Left atrial size was moderately dilated.  4. Right atrial size was moderately dilated.  5. The mitral valve is normal in structure. Mild mitral valve regurgitation. No evidence of mitral stenosis.  6. The aortic valve is tricuspid. There is mild calcification of the aortic valve. Aortic valve regurgitation is moderate. Aortic valve sclerosis is present, with no evidence of aortic valve stenosis. Aortic regurgitation PHT measures 503 msec.  7. Aortic dilatation noted. There is mild dilatation of the ascending aorta, measuring 41 mm.  8. The inferior vena cava is dilated in size with <50% respiratory variability, suggesting right atrial pressure of 15 mmHg. Comparison(s): Prior images reviewed side by side. Conclusion(s)/Recommendation(s): No left ventricular mural or apical  thrombus/thrombi. FINDINGS  Left Ventricle: Left ventricular ejection fraction, by estimation, is 20 to 25%. The left ventricle has severely decreased function. The left ventricle demonstrates global hypokinesis. Definity contrast agent was given IV to delineate the left ventricular endocardial borders. The left ventricular internal cavity size was moderately dilated. There is no left ventricular hypertrophy. Abnormal (paradoxical) septal motion, consistent with left bundle branch block. Left ventricular diastolic parameters are indeterminate. Right Ventricle: The right ventricular size is mildly enlarged. No increase in right ventricular wall thickness. Right ventricular systolic function is mildly reduced. There is moderately elevated pulmonary artery systolic pressure. The tricuspid regurgitant velocity is 3.19 m/s, and with an assumed right atrial pressure of 15 mmHg, the estimated right ventricular systolic pressure is 55.7 mmHg. Left Atrium: Left atrial size was moderately dilated. Right Atrium: Right atrial size was moderately dilated. Pericardium: There is no evidence of pericardial effusion. Mitral Valve: The mitral valve is normal in structure. Mild mitral valve regurgitation. No evidence of mitral valve stenosis. Tricuspid Valve: The tricuspid valve is normal in structure. Tricuspid valve regurgitation is mild . No evidence of tricuspid stenosis. Aortic Valve: The aortic valve is tricuspid. There is mild calcification of the aortic valve. Aortic valve regurgitation is moderate. Aortic regurgitation PHT measures 503 msec. Aortic valve sclerosis is present, with no evidence of aortic valve stenosis. Aortic valve peak gradient measures 5.8 mmHg. Pulmonic Valve: The pulmonic valve was normal in structure. Pulmonic valve regurgitation is mild. No evidence of pulmonic stenosis. Aorta: Aortic dilatation noted. There is mild dilatation of the ascending aorta, measuring 41 mm. Venous: The inferior vena cava is  dilated in size with less than 50% respiratory variability, suggesting right atrial pressure of 15 mmHg. IAS/Shunts: No atrial level shunt detected by color flow Doppler. Additional Comments: A device lead is visualized in the right ventricle and right atrium.  LEFT VENTRICLE PLAX 2D LVIDd:         6.30 cm   Diastology LVIDs:  5.60 cm   LV e' medial:    3.23 cm/s LV PW:         0.90 cm   LV E/e' medial:  29.3 LV IVS:        0.50 cm   LV e' lateral:   3.23 cm/s LVOT diam:     1.90 cm   LV E/e' lateral: 29.3 LV SV:         52 LV SV Index:   29 LVOT Area:     2.84 cm  RIGHT VENTRICLE            IVC RV S prime:     9.08 cm/s  IVC diam: 2.40 cm TAPSE (M-mode): 1.1 cm LEFT ATRIUM             Index        RIGHT ATRIUM           Index LA diam:        4.50 cm 2.46 cm/m   RA Area:     23.00 cm LA Vol (A2C):   63.7 ml 34.79 ml/m  RA Volume:   78.20 ml  42.71 ml/m LA Vol (A4C):   58.9 ml 32.17 ml/m LA Biplane Vol: 62.0 ml 33.86 ml/m  AORTIC VALVE                 PULMONIC VALVE AV Area (Vmax): 2.49 cm     PR End Diast Vel: 9.42 msec AV Vmax:        120.50 cm/s AV Peak Grad:   5.8 mmHg LVOT Vmax:      106.00 cm/s LVOT Vmean:     62.800 cm/s LVOT VTI:       0.185 m AI PHT:         503 msec  AORTA Ao Root diam: 3.30 cm Ao Asc diam:  4.10 cm MITRAL VALVE               TRICUSPID VALVE MV Area (PHT): 3.99 cm    TR Peak grad:   40.7 mmHg MV Decel Time: 190 msec    TR Vmax:        319.00 cm/s MR Peak grad: 41.4 mmHg MR Vmax:      321.67 cm/s  SHUNTS MV E velocity: 94.80 cm/s  Systemic VTI:  0.18 m MV A velocity: 71.10 cm/s  Systemic Diam: 1.90 cm MV E/A ratio:  1.33 Donato Schultz MD Electronically signed by Donato Schultz MD Signature Date/Time: 04/09/2023/6:57:59 AM    Final    DG Chest 2 View Result Date: 04/08/2023 CLINICAL DATA:  Shortness of breath. Bilateral lower extremity swelling. EXAM: CHEST - 2 VIEW COMPARISON:  Chest radiograph dated March 30, 2023. FINDINGS: Stable cardiomegaly with pulmonary vascular  congestion. Stable left-sided pacemaker in place. Prior median sternotomy. Increased size of a small right pleural effusion with increased patchy opacity at the right lung base. No pneumothorax. No acute osseous abnormality. IMPRESSION: 1. Increased size of a small right pleural effusion and patchy opacity at the right lung base, which could represent atelectasis or infiltrate. 2. Cardiomegaly with pulmonary vascular congestion. Electronically Signed   By: Hart Robinsons M.D.   On: 04/08/2023 12:18   CT Chest Wo Contrast Result Date: 03/30/2023 CLINICAL DATA:  Shortness of breath EXAM: CT CHEST WITHOUT CONTRAST TECHNIQUE: Multidetector CT imaging of the chest was performed following the standard protocol without IV contrast. RADIATION DOSE REDUCTION: This exam was performed according to the departmental dose-optimization program which includes  automated exposure control, adjustment of the mA and/or kV according to patient size and/or use of iterative reconstruction technique. COMPARISON:  Same day chest x-ray FINDINGS: Cardiovascular: Left-sided implanted cardiac device in place. Cardiomegaly. No pericardial effusion. Prior sternotomy and CABG. Mid ascending thoracic aorta measures 4.0 cm in diameter. Atherosclerotic calcifications of the aorta and coronary arteries. Central pulmonary vasculature is dilated. Mediastinum/Nodes: No enlarged mediastinal or axillary lymph nodes. Thyroid gland, trachea, and esophagus demonstrate no significant findings. Lungs/Pleura: Moderate layering right-sided pleural effusion with mild right basilar atelectasis or consolidation. Left lung is clear. No pneumothorax. Upper Abdomen: Small volume ascites within the upper abdomen and partially seen within the left paracolic gutter. Musculoskeletal: No chest wall mass or suspicious bone lesions identified. IMPRESSION: 1. Moderate layering right-sided pleural effusion with mild right basilar atelectasis or consolidation. 2.  Cardiomegaly with dilated central pulmonary vasculature, suggestive of pulmonary arterial hypertension. 3. Small volume ascites within the upper abdomen. 4. Aortic and coronary artery atherosclerosis (ICD10-I70.0). Electronically Signed   By: Duanne Guess D.O.   On: 03/30/2023 20:05   DG Chest 2 View Result Date: 03/30/2023 CLINICAL DATA:  Shortness of breath.  Leg swelling. EXAM: CHEST - 2 VIEW COMPARISON:  10/21/2022 FINDINGS: Mild cardiomegaly, slightly increased from prior exam. Left-sided pacemaker in place. Prior median sternotomy. Small right pleural effusion with mild adjacent patchy opacity at the right lung base. Slight vascular congestion without pulmonary edema. No pneumothorax. IMPRESSION: 1. Mild cardiomegaly, slightly increased from prior exam. 2. Small right pleural effusion with mild adjacent patchy opacity at the right lung base, may represent atelectasis or pneumonia. Electronically Signed   By: Narda Rutherford M.D.   On: 03/30/2023 16:31    Microbiology: Recent Results (from the past 240 hours)  Resp panel by RT-PCR (RSV, Flu A&B, Covid) Anterior Nasal Swab     Status: None   Collection Time: 04/08/23 11:22 AM   Specimen: Anterior Nasal Swab  Result Value Ref Range Status   SARS Coronavirus 2 by RT PCR NEGATIVE NEGATIVE Final    Comment: (NOTE) SARS-CoV-2 target nucleic acids are NOT DETECTED.  The SARS-CoV-2 RNA is generally detectable in upper respiratory specimens during the acute phase of infection. The lowest concentration of SARS-CoV-2 viral copies this assay can detect is 138 copies/mL. A negative result does not preclude SARS-Cov-2 infection and should not be used as the sole basis for treatment or other patient management decisions. A negative result may occur with  improper specimen collection/handling, submission of specimen other than nasopharyngeal swab, presence of viral mutation(s) within the areas targeted by this assay, and inadequate number of  viral copies(<138 copies/mL). A negative result must be combined with clinical observations, patient history, and epidemiological information. The expected result is Negative.  Fact Sheet for Patients:  BloggerCourse.com  Fact Sheet for Healthcare Providers:  SeriousBroker.it  This test is no t yet approved or cleared by the [place] FDA and  has been authorized for detection and/or diagnosis of SARS-CoV-2 by FDA under an Emergency Use Authorization (EUA). This EUA will remain  in effect (meaning this test can be used) for the duration of the COVID-19 declaration under Section 564(b)(1) of the Act, 21 U.S.C.section 360bbb-3(b)(1), unless the authorization is terminated  or revoked sooner.       Influenza A by PCR NEGATIVE NEGATIVE Final   Influenza B by PCR NEGATIVE NEGATIVE Final    Comment: (NOTE) The Xpert Xpress SARS-CoV-2/FLU/RSV plus assay is intended as an aid in the diagnosis of influenza from  Nasopharyngeal swab specimens and should not be used as a sole basis for treatment. Nasal washings and aspirates are unacceptable for Xpert Xpress SARS-CoV-2/FLU/RSV testing.  Fact Sheet for Patients: BloggerCourse.com  Fact Sheet for Healthcare Providers: SeriousBroker.it  This test is not yet approved or cleared by the [place] FDA and has been authorized for detection and/or diagnosis of SARS-CoV-2 by FDA under an Emergency Use Authorization (EUA). This EUA will remain in effect (meaning this test can be used) for the duration of the COVID-19 declaration under Section 564(b)(1) of the Act, 21 U.S.C. section 360bbb-3(b)(1), unless the authorization is terminated or revoked.     Resp Syncytial Virus by PCR NEGATIVE NEGATIVE Final    Comment: (NOTE) Fact Sheet for Patients: BloggerCourse.com  Fact Sheet for Healthcare  Providers: SeriousBroker.it  This test is not yet approved or cleared by the [place] FDA and has been authorized for detection and/or diagnosis of SARS-CoV-2 by FDA under an Emergency Use Authorization (EUA). This EUA will remain in effect (meaning this test can be used) for the duration of the COVID-19 declaration under Section 564(b)(1) of the Act, 21 U.S.C. section 360bbb-3(b)(1), unless the authorization is terminated or revoked.  Performed at [facility], [address] [place], [place] [address]      Labs: Basic Metabolic Panel: Recent Labs  Lab 04/08/23 1138 04/09/23 0238 04/10/23 0230 04/11/23 0205  NA 135 138 139 138  K 3.6 3.5 3.4* 3.3*  CL 99 100 99 97*  CO2 26 24 28 28   GLUCOSE 118* 92 102* 98  BUN 40* 38* 38* 40*  CREATININE 2.20* 2.29* 2.31* 2.19*  CALCIUM 9.0 9.1 9.1 8.9  MG  --   --  2.3  --    Liver Function Tests: Recent Labs  Lab 04/08/23 1202  AST 31  ALT 27  ALKPHOS 170*  BILITOT 1.8*  PROT 6.4*  ALBUMIN 3.2*   No results for input(s): "LIPASE", "AMYLASE" in the last 168 hours. No results for input(s): "AMMONIA" in the last 168 hours. CBC: Recent Labs  Lab 04/08/23 1138  WBC 5.4  HGB 12.8*  HCT 40.2  MCV 88.0  PLT 209   Cardiac Enzymes: No results for input(s): "CKTOTAL", "CKMB", "CKMBINDEX", "TROPONINI" in the last 168 hours. BNP: BNP (last 3 results) Recent Labs    03/30/23 1427 04/08/23 1138  BNP 2,736.6* >4,500.0*    ProBNP (last 3 results) No results for input(s): "PROBNP" in the last 8760 hours.  CBG: No results for input(s): "GLUCAP" in the last 168 hours.     Signed:  Zannie Cove MD.  [facility] 04/11/2023, 12:50 PM

## 2023-04-19 ENCOUNTER — Ambulatory Visit: Payer: Medicare HMO | Attending: Cardiology

## 2023-04-19 DIAGNOSIS — I5022 Chronic systolic (congestive) heart failure: Secondary | ICD-10-CM

## 2023-04-19 DIAGNOSIS — Z95810 Presence of automatic (implantable) cardiac defibrillator: Secondary | ICD-10-CM | POA: Diagnosis not present

## 2023-04-22 NOTE — Progress Notes (Signed)
 EPIC Encounter for ICM Monitoring  Patient Name: Dave Daniels is a 83 y.o. male Date: 04/22/2023 Primary Care Physican: Andrew Truman GRADE., MD Primary Cardiologist: Pietro Electrophysiologist: Inocencio Nephrologist: [facility] Bi-V Pacing: 95%    07/03/2022 Weight: 150 lbs 10/16/2022 Weight: 142 -143 lbs lbs 12/28/2022 Weight: 142 lbs  04/22/2023 Weight: 138 lbs lbs      AT/AF Burden 0% (taking Eliquis )                                     Spoke with patient and heart failure questions reviewed.  Transmission results reviewed.  Pt reports no leg or swelling of feet since 12/9 hospital discharge. Weight is stable at 138 lbs.   He is feeling very well at this time.  ED visit 12/17 and hospitalization 12/26 for CHF  Diet:   He has been limiting fluid intake to 48 ounces a day since hospital discharge which may reflect the possible dryness on thoracic impedance.     Corvue thoracic impedance suggesting possible dryness since 12/29 hospital discharge.  Difficulty maintaining normal fluid levels since 12/2022.     Prescribed:  Torsemide  20 mg take 1 tablet(s) (20 mg total) by mouth twice a day.   Confirmed 1/9 taking as prescribed 20 mg bid Potassium 20 mEq take 2 tablets (40 mEq total) by mouth once a day   Confirmed 1/9 taking as prescribed 40 mEq daily   Labs: 04/11/2023 Creatinine 2.19, BUN 40, Potassium 3.3, Sodium 138, GFR 29  04/10/2023 Creatinine 2.31, BUN 38, Potassium 3.4, Sodium 139, GFR 28  04/09/2023 Creatinine 2.29, BUN 38, Potassium 3.5, Sodium 138, GFR 28  04/08/2023 Creatinine 2.20, BUN 40, Potassium 3.6, Sodium 135, GFR 29 03/30/2023 Creatinine 2.44, BUN 45, Potassium 3.6, Sodium 139, GFR 26  02/11/2023 Creatinine 2.30, BUN 43, Potassium 4.7, Sodium 143  A complete set of results can be found in Results Review   Recommendations:  Has Nephrologist appointment next week but did not have post hospitalization appt with Dr Pietro.     Advised  patient to drink between 55-65 oz fluid daily to help maintain hydration.  Also advised to call Dr Vertie office to set up post hospitalization visit to review condition and meds.      Follow-up plan: ICM clinic phone appointment on 05/04/2023 to recheck fluid levels.   91 day device clinic remote transmission 05/10/2023.     EP/Cardiology Office Visits:  Recall 08/19/2023 with Dr Inocencio.  06/30/2023 with Dr Pietro.   Copy of ICM check sent to Dr. Inocencio and Dr Pietro for review.  3 month ICM trend: 04/19/2023.    12-14 Month ICM trend:     Mitzie GORMAN Garner, RN 04/22/2023 10:37 AM

## 2023-04-23 NOTE — Progress Notes (Signed)
 No recommendations received at this time.

## 2023-05-04 ENCOUNTER — Ambulatory Visit: Payer: Medicare HMO | Attending: Cardiology

## 2023-05-04 DIAGNOSIS — I5022 Chronic systolic (congestive) heart failure: Secondary | ICD-10-CM

## 2023-05-04 DIAGNOSIS — Z95810 Presence of automatic (implantable) cardiac defibrillator: Secondary | ICD-10-CM

## 2023-05-05 NOTE — Progress Notes (Signed)
EPIC Encounter for ICM Monitoring  Patient Name: Dave Daniels is a 83 y.o. male Date: 05/05/2023 Primary Care Physican: Cheron Schaumann., MD Primary Cardiologist: Jens Som Electrophysiologist: Elberta Fortis Nephrologist: [facility] Bi-V Pacing: 96%    07/03/2022 Weight: 150 lbs 10/16/2022 Weight: 142 -143 lbs lbs 12/28/2022 Weight: 142 lbs  04/22/2023 Weight: 138 lbs lbs      AT/AF Burden 0% (taking Eliquis)                                     Spoke with patient and heart failure questions reviewed.  Transmission results reviewed.  Pt asymptomatic for fluid accumulation.  Reports feeling well at this time and voices no complaints.     Diet:   No changes    Corvue thoracic impedance suggesting fluid levels returned to normal.  Difficulty maintaining normal fluid levels since 12/2022.     Prescribed:  Torsemide 20 mg take 1 tablet(s) (20 mg total) by mouth twice a day.   Confirmed 1/9 taking as prescribed 20 mg bid Potassium 20 mEq take 2 tablets (40 mEq total) by mouth once a day   Confirmed 1/9 taking as prescribed 40 mEq daily   Labs: 04/11/2023 Creatinine 2.19, BUN 40, Potassium 3.3, Sodium 138, GFR 29  04/10/2023 Creatinine 2.31, BUN 38, Potassium 3.4, Sodium 139, GFR 28  04/09/2023 Creatinine 2.29, BUN 38, Potassium 3.5, Sodium 138, GFR 28  04/08/2023 Creatinine 2.20, BUN 40, Potassium 3.6, Sodium 135, GFR 29 03/30/2023 Creatinine 2.44, BUN 45, Potassium 3.6, Sodium 139, GFR 26  02/11/2023 Creatinine 2.30, BUN 43, Potassium 4.7, Sodium 143  A complete set of results can be found in Results Review   Recommendations:  No changes and encouraged to call if experiencing any fluid symptoms.    Follow-up plan: ICM clinic phone appointment on 05/24/2023.   91 day device clinic remote transmission 05/10/2023.     EP/Cardiology Office Visits:  Recall 08/19/2023 with Dr Elberta Fortis.  06/02/2023 with Dr Jens Som.   Copy of ICM check sent to Dr. Elberta Fortis.   3 month ICM  trend: 05/04/2023.    12-14 Month ICM trend:     Karie Soda, RN 05/05/2023 4:29 PM

## 2023-05-07 ENCOUNTER — Telehealth: Payer: Self-pay | Admitting: Cardiology

## 2023-05-07 MED ORDER — POTASSIUM CHLORIDE CRYS ER 20 MEQ PO TBCR: 40.0000 meq | EXTENDED_RELEASE_TABLET | Freq: Every day | ORAL | 3 refills | Status: DC

## 2023-05-07 NOTE — Telephone Encounter (Signed)
Pt c/o medication issue:  1. Name of Medication:   potassium chloride SA (KLOR-CON M) 20 MEQ tablet    2. How are you currently taking this medication (dosage and times per day)? 2x daily   3. Are you having a reaction (difficulty breathing--STAT)?   4. What is your medication issue?  Patient states he was given this in hosp for 15 day fill and would like to know if he is to continue this medication. Please advise.

## 2023-05-07 NOTE — Telephone Encounter (Signed)
Spoke with pt, aware to continue potassium. Refill sent to the pharmacy electronically.

## 2023-05-10 ENCOUNTER — Ambulatory Visit: Payer: Medicare HMO

## 2023-05-10 DIAGNOSIS — I255 Ischemic cardiomyopathy: Secondary | ICD-10-CM

## 2023-05-10 DIAGNOSIS — I5022 Chronic systolic (congestive) heart failure: Secondary | ICD-10-CM | POA: Diagnosis not present

## 2023-05-11 LAB — CUP PACEART REMOTE DEVICE CHECK
Battery Remaining Longevity: 65 mo
Battery Remaining Percentage: 84 %
Battery Voltage: 2.98 V
Brady Statistic AP VP Percent: 73 %
Brady Statistic AP VS Percent: 1 %
Brady Statistic AS VP Percent: 23 %
Brady Statistic AS VS Percent: 1 %
Brady Statistic RA Percent Paced: 71 %
Date Time Interrogation Session: 20250128022255
HighPow Impedance: 62 Ohm
Implantable Lead Connection Status: 753985
Implantable Lead Connection Status: 753985
Implantable Lead Connection Status: 753985
Implantable Lead Implant Date: 20121219
Implantable Lead Implant Date: 20121219
Implantable Lead Implant Date: 20121219
Implantable Lead Location: 753858
Implantable Lead Location: 753859
Implantable Lead Location: 753860
Implantable Pulse Generator Implant Date: 20240126
Lead Channel Impedance Value: 330 Ohm
Lead Channel Impedance Value: 430 Ohm
Lead Channel Impedance Value: 660 Ohm
Lead Channel Pacing Threshold Amplitude: 0.75 V
Lead Channel Pacing Threshold Amplitude: 1 V
Lead Channel Pacing Threshold Amplitude: 2.25 V
Lead Channel Pacing Threshold Pulse Width: 0.5 ms
Lead Channel Pacing Threshold Pulse Width: 0.5 ms
Lead Channel Pacing Threshold Pulse Width: 0.6 ms
Lead Channel Sensing Intrinsic Amplitude: 11.8 mV
Lead Channel Sensing Intrinsic Amplitude: 2.7 mV
Lead Channel Setting Pacing Amplitude: 2 V
Lead Channel Setting Pacing Amplitude: 2.5 V
Lead Channel Setting Pacing Amplitude: 2.75 V
Lead Channel Setting Pacing Pulse Width: 0.5 ms
Lead Channel Setting Pacing Pulse Width: 0.6 ms
Lead Channel Setting Sensing Sensitivity: 0.5 mV
Pulse Gen Serial Number: 211014942

## 2023-05-19 NOTE — Progress Notes (Signed)
 HPI: FU atrial fibrillation, coronary artery disease status post coronary artery bypass and graft as well as ischemic cardiomyopathy/CHF. Patient's cardiac history dates back to 2010 when he had his first myocardial infarction. He had stents placed in [place]. He had a second myocardial infarction in March of 2012 and then had coronary artery bypass and graft. Cardiac catheterization was performed in August of 2012. Ejection fraction was 20%. The right coronary and LAD were occluded and there was critical circumflex disease. There was a patent saphenous vein graft to the right coronary artery. The LIMA to the LAD was patent. The saphenous vein graft to the obtuse marginal was also patent. Patient had biventricular ICD placed in December of 2012. Has had previous DCCV.  Admitted with CHF December 2024.  He was diuresed with improvement.  Echocardiogram December 2024 showed ejection fraction 20 to 25%, moderate left ventricular enlargement, mild RV dysfunction, mild right ventricular enlargement, moderate pulmonary hypertension, moderate biatrial enlargement, mild mitral regurgitation, moderate aortic insufficiency, mildly dilated ascending aorta at 41 mm.  Chest CT December 2024 showed right pleural effusion, dilated pulmonary artery suggestive of pulmonary hypertension and small volume ascites.  Since last seen, he denies dyspnea, chest pain, palpitations or syncope.  No pedal edema or bleeding.  Current Outpatient Medications  Medication Sig Dispense Refill   acetaminophen (TYLENOL) 500 MG tablet Take 1,000 mg by mouth as needed for moderate pain or headache.      allopurinol (ZYLOPRIM) 300 MG tablet Take 300 mg by mouth daily as needed (Gout).  11   apixaban (ELIQUIS) 2.5 MG TABS tablet Take 1 tablet (2.5 mg total) by mouth 2 (two) times daily. 56 tablet    carvedilol (COREG) 12.5 MG tablet TAKE 1 TABLET BY MOUTH TWICE DAILY WITH A MEAL 180 tablet 0   cholecalciferol (VITAMIN D3) 25 MCG (1000 UNIT)  tablet Take 1,000 Units by mouth daily.     colchicine 0.6 MG tablet Take 0.6 mg by mouth as needed (Gout).     Evolocumab (REPATHA SURECLICK) 140 MG/ML SOAJ Inject 140 mg into the skin every 14 (fourteen) days. 6 mL 3   ezetimibe (ZETIA) 10 MG tablet Take 1 tablet (10 mg total) by mouth daily. 90 tablet 3   finasteride (PROSCAR) 5 MG tablet Take 5 mg by mouth daily.     hydrALAZINE (APRESOLINE) 25 MG tablet Take 1 tablet (25 mg total) by mouth 3 (three) times daily. 90 tablet 1   isosorbide mononitrate (IMDUR) 30 MG 24 hr tablet Take 2 tablets (60 mg total) by mouth daily. 90 tablet 1   oxybutynin (DITROPAN-XL) 10 MG 24 hr tablet Take 10 mg by mouth daily.     potassium chloride SA (KLOR-CON M) 20 MEQ tablet Take 2 tablets (40 mEq total) by mouth daily. 180 tablet 3   tamsulosin (FLOMAX) 0.4 MG CAPS capsule Take 0.4 mg by mouth daily.     torsemide (DEMADEX) 20 MG tablet Take 1 tablet (20 mg total) by mouth 2 (two) times daily. 60 tablet 1   No current facility-administered medications for this visit.     Past Medical History:  Diagnosis Date   CAD (coronary artery disease)    MI in [place] with stenting in 2010, then MI with CABG in [place] 06/2010.  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   Complete heart block (HCC)    Gout    Hyperlipidemia    Hypertension    Ischemic cardiomyopathy    EF 15% by echo 11/2010 and  still 20-25% by follow-up echo 02/2011, s/p [employer] Bi-V ICD implantation 04/01/11   LBBB (left bundle branch block)    Renal insufficiency    Cr 1.6 on 03/25/11    Past Surgical History:  Procedure Laterality Date   BI-VENTRICULAR IMPLANTABLE CARDIOVERTER DEFIBRILLATOR N/A 04/01/2011   Procedure: BI-VENTRICULAR IMPLANTABLE CARDIOVERTER DEFIBRILLATOR  (CRT-D);  Surgeon: Marinus Maw, MD;  Location: [facility] CATH LAB;  Service: Cardiovascular;  Laterality: N/A;   BIV ICD GENERATOR CHANGEOUT N/A 05/08/2022   Procedure: BIV ICD GENERATOR CHANGEOUT;  Surgeon: Regan Lemming, MD;   Location[facility] INVASIVE CV LAB;  Service: Cardiovascular;  Laterality: N/A;   CARDIAC DEFIBRILLATOR PLACEMENT  12/12   BiV ICD (SJM) implanted by Dr Johney Frame   CARDIOVERSION N/A 04/22/2018   Procedure: CARDIOVERSION;  Surgeon: Quintella Reichert, MD;  Location: [facility] ENDOSCOPY;  Service: Cardiovascular;  Laterality: N/A;   CARDIOVERSION N/A 06/13/2019   Procedure: CARDIOVERSION;  Surgeon: Quintella Reichert, MD;  Location: [facility] ENDOSCOPY;  Service: Cardiovascular;  Laterality: N/A;   CARDIOVERSION N/A 02/11/2023   Procedure: CARDIOVERSION (CATH LAB);  Surgeon: Thomasene Ripple, DO;  Location: [facility] INVASIVE CV LAB;  Service: Cardiovascular;  Laterality: N/A;   Carpel tunnel surgery     CORONARY ARTERY BYPASS GRAFT  3/12   in [place]   EP IMPLANTABLE DEVICE N/A 08/19/2015   Procedure: BIV ICD Generator Changeout;  Surgeon: Hillis Range, MD;  Location: [facility] INVASIVE CV LAB;  Service: Cardiovascular;  Laterality: N/A;   INGUINAL HERNIA REPAIR     TONSILLECTOMY     UMBILICAL HERNIA REPAIR      Social History   Socioeconomic History   Marital status: Married    Spouse name: Not on file   Number of children: 4   Years of education: Not on file   Highest education level: Not on file  Occupational History    Comment: Retired  Tobacco Use   Smoking status: Never   Smokeless tobacco: Never  Vaping Use   Vaping status: Never Used  Substance and Sexual Activity   Alcohol use: Yes    Alcohol/week: 2.0 standard drinks of alcohol    Types: 2 Glasses of wine per week    Comment: occasional   Drug use: No   Sexual activity: Yes  Other Topics Concern   Not on file  Social History Narrative   Lives in [place], recently moved from [place].  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   Social Drivers of Corporate investment banker Strain: Not on file  Food Insecurity: No Food Insecurity (04/08/2023)   Hunger Vital Sign    Worried About Running Out of Food in the Last Year: Never true    Ran Out of Food in the Last Year: Never true   Transportation Needs: No Transportation Needs (04/08/2023)   PRAPARE - Administrator, Civil Service (Medical): No    Lack of Transportation (Non-Medical): No  Physical Activity: Not on file  Stress: Not on file  Social Connections: Not on file  Intimate Partner Violence: Not At Risk (04/08/2023)   Humiliation, Afraid, Rape, and Kick questionnaire    Fear of Current or Ex-Partner: No    Emotionally Abused: No    Physically Abused: No    Sexually Abused: No    Family History  Problem Relation Age of Onset   Pancreatic cancer Mother     ROS: no fevers or chills, productive cough, hemoptysis, dysphasia, odynophagia, melena, hematochezia, dysuria, hematuria, rash, seizure activity, orthopnea, PND,  pedal edema, claudication. Remaining systems are negative.  Physical Exam: Well-developed well-nourished in no acute distress.  Skin is warm and dry.  HEENT is normal.  Neck is supple.  Chest is clear to auscultation with normal expansion.  Cardiovascular exam is regular rate and rhythm.  2/6 diastolic murmur left sternal border.  She has a left Abdominal exam nontender or distended. No masses palpated. Extremities show no edema. neuro grossly intact  A/P  1 chronic systolic congestive heart failure-patient was admitted in December with CHF.  Ejection fraction remains severely reduced.  Will continue hydralazine/nitrates and carvedilol.   He is not on ARB/ARNI or spironolactone due to significant renal insufficiency.  Continue Demadex at present dose.  He was on Jardiance previously but apparently this was too expensive.  He check with his insurance company to see if this is affordable.  If so we will resume 10 mg daily and check potassium and renal function in 1 week.  2 ischemic cardiomyopathy-plan as outlined above.  Note Sherryll Burger was previously discontinued by nephrology.  3 history of apical thrombus-not evident on most recent echocardiogram.  He is on apixaban for  history of atrial fibrillation.  4 paroxysmal atrial fibrillation-patient is status post cardioversion October 2024.  Most recent ECG in December showed continued sinus rhythm.  Will continue apixaban and carvedilol.  Can consider addition of amiodarone in the future if needed.  5 prior CRT-D-managed by EP.  6 coronary artery disease-patient is not on aspirin given need for apixaban.  He is statin intolerant.  7 hyperlipidemia-intolerant to statins.  Continue bempedoic acid and Zetia.  8 hypertension-patient's blood pressure is controlled.  Continue present medications.  9 history of chronic stage IIIb kidney disease-recheck potassium and renal function.  Olga Millers, MD

## 2023-06-01 ENCOUNTER — Ambulatory Visit: Payer: Medicare HMO | Attending: Cardiology

## 2023-06-01 ENCOUNTER — Telehealth: Payer: Self-pay

## 2023-06-01 DIAGNOSIS — I5022 Chronic systolic (congestive) heart failure: Secondary | ICD-10-CM | POA: Diagnosis not present

## 2023-06-01 DIAGNOSIS — Z95810 Presence of automatic (implantable) cardiac defibrillator: Secondary | ICD-10-CM

## 2023-06-01 NOTE — Telephone Encounter (Signed)
 Remote ICM transmission received.  Attempted call to patient regarding ICM remote transmission and no answer.

## 2023-06-01 NOTE — Progress Notes (Signed)
 EPIC Encounter for ICM Monitoring  Patient Name: Dave Daniels is a 83 y.o. male Date: 06/01/2023 Primary Care Physican: Cheron Schaumann., MD Primary Cardiologist: Jens Som Electrophysiologist: Elberta Fortis Nephrologist: [facility] Bi-V Pacing: 97%    07/03/2022 Weight: 150 lbs 10/16/2022 Weight: 142 -143 lbs lbs 12/28/2022 Weight: 142 lbs  04/22/2023 Weight: 138 lbs lbs      AT/AF Burden 0% (taking Eliquis)                                     Attempted call to patient and unable to reach.   Transmission results reviewed.    Diet:   No changes    Corvue thoracic impedance suggesting normal fluid levels since 1/29.     Prescribed:  Torsemide 20 mg take 1 tablet(s) (20 mg total) by mouth twice a day.   Confirmed 1/9 taking as prescribed 20 mg bid Potassium 20 mEq take 2 tablets (40 mEq total) by mouth once a day   Confirmed 1/9 taking as prescribed 40 mEq daily   Labs: 04/26/2023 Creatinine 2.08, BUN 45, Potassium 4.3, Sodium 141, GFR 31 04/11/2023 Creatinine 2.19, BUN 40, Potassium 3.3, Sodium 138, GFR 29  04/10/2023 Creatinine 2.31, BUN 38, Potassium 3.4, Sodium 139, GFR 28  04/09/2023 Creatinine 2.29, BUN 38, Potassium 3.5, Sodium 138, GFR 28  04/08/2023 Creatinine 2.20, BUN 40, Potassium 3.6, Sodium 135, GFR 29 03/30/2023 Creatinine 2.44, BUN 45, Potassium 3.6, Sodium 139, GFR 26  02/11/2023 Creatinine 2.30, BUN 43, Potassium 4.7, Sodium 143  A complete set of results can be found in Results Review   Recommendations:  Unable to reach.     Follow-up plan: ICM clinic phone appointment on 07/05/2023.   91 day device clinic remote transmission 08/09/2023.     EP/Cardiology Office Visits:  Recall 08/19/2023 with Dr Elberta Fortis.  06/02/2023 with Dr Jens Som.   Copy of ICM check sent to Dr. Elberta Fortis.   3 month ICM trend: 06/01/2023.    12-14 Month ICM trend:     Karie Soda, RN 06/01/2023 10:39 AM

## 2023-06-02 ENCOUNTER — Encounter: Payer: Self-pay | Admitting: Cardiology

## 2023-06-02 ENCOUNTER — Ambulatory Visit: Payer: Medicare HMO | Attending: Cardiology | Admitting: Cardiology

## 2023-06-02 VITALS — BP 138/78 | HR 70 | Ht 67.0 in | Wt 138.0 lb

## 2023-06-02 DIAGNOSIS — I5022 Chronic systolic (congestive) heart failure: Secondary | ICD-10-CM | POA: Diagnosis not present

## 2023-06-02 DIAGNOSIS — E7800 Pure hypercholesterolemia, unspecified: Secondary | ICD-10-CM

## 2023-06-02 DIAGNOSIS — Z95810 Presence of automatic (implantable) cardiac defibrillator: Secondary | ICD-10-CM | POA: Diagnosis not present

## 2023-06-02 DIAGNOSIS — I480 Paroxysmal atrial fibrillation: Secondary | ICD-10-CM

## 2023-06-02 DIAGNOSIS — I2510 Atherosclerotic heart disease of native coronary artery without angina pectoris: Secondary | ICD-10-CM

## 2023-06-02 NOTE — Patient Instructions (Signed)
 Medication Instructions:   CHECK THE PRICE OF FARXIGA OR JARDIANCE  *If you need a refill on your cardiac medications before your next appointment, please call your pharmacy*   Follow-Up: At [facility], you and your health needs are our priority.  As part of our continuing mission to provide you with exceptional heart care, we have created designated Provider Care Teams.  These Care Teams include your primary Cardiologist (physician) and Advanced Practice Providers (APPs -  Physician Assistants and Nurse Practitioners) who all work together to provide you with the care you need, when you need it.  We recommend signing up for the patient portal called "MyChart".  Sign up information is provided on this After Visit Summary.  MyChart is used to connect with patients for Virtual Visits (Telemedicine).  Patients are able to view lab/test results, encounter notes, upcoming appointments, etc.  Non-urgent messages can be sent to your provider as well.   To learn more about what you can do with MyChart, go to [email or web address].    Your next appointment:   12 month(s)  Provider:   Olga Millers MD

## 2023-06-08 ENCOUNTER — Telehealth: Payer: Self-pay | Admitting: Cardiology

## 2023-06-08 NOTE — Telephone Encounter (Signed)
 Pt c/o medication issue:  1. Name of Medication: apixaban (ELIQUIS) 2.5 MG TABS tablet   2. How are you currently taking this medication (dosage and times per day)? As written  3. Are you having a reaction (difficulty breathing--STAT)?   4. What is your medication issue? Pt wants to speak to nurse Stanton Kidney about his Eliqus.

## 2023-06-08 NOTE — Telephone Encounter (Signed)
 Patient identification verified by 2 forms. Marilynn Rail, RN    Called and spoke to patient  Patient states:   -would like to speak to RN Stanton Kidney regarding Eliquis Rx   -he does not wish to discuss with another nurse   -he will wait for Stanton Kidney to return the call at her convenience  Informed patient message sent

## 2023-06-12 ENCOUNTER — Other Ambulatory Visit: Payer: Self-pay | Admitting: Cardiology

## 2023-06-12 DIAGNOSIS — I480 Paroxysmal atrial fibrillation: Secondary | ICD-10-CM

## 2023-06-14 NOTE — Telephone Encounter (Signed)
 Spoke with pt, samples of eliquis 2.5 mg placed at the front desk for patient pick up.

## 2023-06-14 NOTE — Telephone Encounter (Signed)
 Prescription refill request for Eliquis received. Indication:afib Last office visit:2/25 Scr:2.19  12/24 Age: 83 Weight:62.6  kg  Prescription refilled

## 2023-06-16 ENCOUNTER — Other Ambulatory Visit: Payer: Self-pay

## 2023-06-16 MED ORDER — HYDRALAZINE HCL 25 MG PO TABS: 25.0000 mg | ORAL_TABLET | Freq: Three times a day (TID) | ORAL | 3 refills | Status: AC

## 2023-06-16 MED ORDER — TORSEMIDE 20 MG PO TABS: 20.0000 mg | ORAL_TABLET | Freq: Two times a day (BID) | ORAL | 3 refills | Status: AC

## 2023-06-16 NOTE — Addendum Note (Signed)
 Addended by: Margaret Pyle D on: 06/16/2023 03:09 PM   Modules accepted: Orders

## 2023-06-18 NOTE — Progress Notes (Signed)
 Remote ICD transmission.

## 2023-06-18 NOTE — Addendum Note (Signed)
 Addended by: Geralyn Flash D on: 06/18/2023 01:40 PM   Modules accepted: Orders

## 2023-06-19 ENCOUNTER — Other Ambulatory Visit: Payer: Self-pay | Admitting: Cardiology

## 2023-06-30 ENCOUNTER — Ambulatory Visit: Payer: Medicare HMO | Admitting: Cardiology

## 2023-07-05 ENCOUNTER — Ambulatory Visit: Payer: Medicare HMO | Attending: Cardiology

## 2023-07-05 DIAGNOSIS — I5022 Chronic systolic (congestive) heart failure: Secondary | ICD-10-CM

## 2023-07-05 DIAGNOSIS — Z95810 Presence of automatic (implantable) cardiac defibrillator: Secondary | ICD-10-CM

## 2023-07-09 ENCOUNTER — Telehealth: Payer: Self-pay

## 2023-07-09 NOTE — Progress Notes (Signed)
 EPIC Encounter for ICM Monitoring  Patient Name: Dave Daniels is a 83 y.o. male Date: 07/09/2023 Primary Care Physican: Cheron Schaumann., MD Primary Cardiologist: Jens Som Electrophysiologist: Elberta Fortis Nephrologist: [facility] Bi-V Pacing: 97%    07/03/2022 Weight: 150 lbs 10/16/2022 Weight: 142 -143 lbs lbs 12/28/2022 Weight: 142 lbs  04/22/2023 Weight: 138 lbs lbs      AT/AF Burden 6.5% (taking Eliquis)                                     Attempted call to patient and unable to reach.  Left detailed message per DPR regarding transmission.  Transmission results reviewed.    Diet:   No changes    Corvue thoracic impedance suggesting normal fluid levels with the exception of possible fluid accumulation from 2/18-3/4.     Prescribed:  Torsemide 20 mg take 1 tablet(s) (20 mg total) by mouth twice a day.   Confirmed 1/9 taking as prescribed 20 mg bid Potassium 20 mEq take 2 tablets (40 mEq total) by mouth once a day   Confirmed 1/9 taking as prescribed 40 mEq daily   Labs: 04/26/2023 Creatinine 2.08, BUN 45, Potassium 4.3, Sodium 141, GFR 31 04/11/2023 Creatinine 2.19, BUN 40, Potassium 3.3, Sodium 138, GFR 29  04/10/2023 Creatinine 2.31, BUN 38, Potassium 3.4, Sodium 139, GFR 28  04/09/2023 Creatinine 2.29, BUN 38, Potassium 3.5, Sodium 138, GFR 28  04/08/2023 Creatinine 2.20, BUN 40, Potassium 3.6, Sodium 135, GFR 29 03/30/2023 Creatinine 2.44, BUN 45, Potassium 3.6, Sodium 139, GFR 26  02/11/2023 Creatinine 2.30, BUN 43, Potassium 4.7, Sodium 143  A complete set of results can be found in Results Review   Recommendations:  Left voice mail with ICM number and encouraged to call if experiencing any fluid symptoms.   Follow-up plan: ICM clinic phone appointment on 08/10/2023.   91 day device clinic remote transmission 08/09/2023.     EP/Cardiology Office Visits:  Recall 08/19/2023 with Dr Elberta Fortis.  Recall 11/29/2023 with Dr Jens Som.   Copy of ICM check sent  to Dr. Elberta Fortis.   3 month ICM trend: 07/05/2023.    12-14 Month ICM trend:     Karie Soda, RN 07/09/2023 7:32 AM

## 2023-07-09 NOTE — Telephone Encounter (Signed)
 Remote ICM transmission received.  Attempted call to patient regarding ICM remote transmission and left detailed message per DPR.  Left ICM phone number and advised to return call for any fluid symptoms or questions. Next ICM remote transmission scheduled 08/10/2023.

## 2023-08-09 ENCOUNTER — Ambulatory Visit (INDEPENDENT_AMBULATORY_CARE_PROVIDER_SITE_OTHER): Payer: Medicare HMO

## 2023-08-09 DIAGNOSIS — I5022 Chronic systolic (congestive) heart failure: Secondary | ICD-10-CM | POA: Diagnosis not present

## 2023-08-09 DIAGNOSIS — I255 Ischemic cardiomyopathy: Secondary | ICD-10-CM

## 2023-08-09 LAB — CUP PACEART REMOTE DEVICE CHECK
Battery Remaining Longevity: 61 mo
Battery Remaining Percentage: 80 %
Battery Voltage: 2.98 V
Brady Statistic AP VP Percent: 79 %
Brady Statistic AP VS Percent: 1 %
Brady Statistic AS VP Percent: 18 %
Brady Statistic AS VS Percent: 1 %
Brady Statistic RA Percent Paced: 73 %
Date Time Interrogation Session: 20250428020920
HighPow Impedance: 53 Ohm
Implantable Lead Connection Status: 753985
Implantable Lead Connection Status: 753985
Implantable Lead Connection Status: 753985
Implantable Lead Implant Date: 20121219
Implantable Lead Implant Date: 20121219
Implantable Lead Implant Date: 20121219
Implantable Lead Location: 753858
Implantable Lead Location: 753859
Implantable Lead Location: 753860
Implantable Pulse Generator Implant Date: 20240126
Lead Channel Impedance Value: 300 Ohm
Lead Channel Impedance Value: 390 Ohm
Lead Channel Impedance Value: 610 Ohm
Lead Channel Pacing Threshold Amplitude: 0.75 V
Lead Channel Pacing Threshold Amplitude: 1 V
Lead Channel Pacing Threshold Amplitude: 2.375 V
Lead Channel Pacing Threshold Pulse Width: 0.5 ms
Lead Channel Pacing Threshold Pulse Width: 0.5 ms
Lead Channel Pacing Threshold Pulse Width: 0.6 ms
Lead Channel Sensing Intrinsic Amplitude: 12 mV
Lead Channel Sensing Intrinsic Amplitude: 3.1 mV
Lead Channel Setting Pacing Amplitude: 2 V
Lead Channel Setting Pacing Amplitude: 2.5 V
Lead Channel Setting Pacing Amplitude: 2.875
Lead Channel Setting Pacing Pulse Width: 0.5 ms
Lead Channel Setting Pacing Pulse Width: 0.6 ms
Lead Channel Setting Sensing Sensitivity: 0.5 mV
Pulse Gen Serial Number: 211014942

## 2023-08-10 ENCOUNTER — Telehealth: Payer: Self-pay

## 2023-08-10 ENCOUNTER — Ambulatory Visit: Attending: Cardiology

## 2023-08-10 DIAGNOSIS — Z95810 Presence of automatic (implantable) cardiac defibrillator: Secondary | ICD-10-CM

## 2023-08-10 DIAGNOSIS — I5022 Chronic systolic (congestive) heart failure: Secondary | ICD-10-CM

## 2023-08-10 NOTE — Progress Notes (Addendum)
 EPIC Encounter for ICM Monitoring  Patient Name: Dave Daniels is a 83 y.o. male Date: 08/10/2023 Primary Care Physican: Lieutenant Reese., MD Primary Cardiologist: Audery Blazing Electrophysiologist: Lawana Pray Nephrologist: [facility] Bi-V Pacing: 98%    07/03/2022 Weight: 150 lbs 10/16/2022 Weight: 142 -143 lbs lbs 12/28/2022 Weight: 142 lbs  04/22/2023 Weight: 138 lbs lbs    08/10/2023 Weight: unknown lbs  AT/AF Burden 5.2% (taking Eliquis )                                    Attempted call to patient and unable to reach.  Left detailed message per DPR regarding transmission.  Transmission results reviewed.    Diet:  Not reviewed.    Corvue thoracic impedance suggesting possible fluid accumulation starting 4/19 and returned to baseline 4/27.  Also suggesting possible fluid from 3/29-4/6.   Prescribed:  Torsemide  20 mg take 1 tablet(s) (20 mg total) by mouth twice a day.    Potassium 20 mEq take 2 tablets (40 mEq total) by mouth once a day      Labs: 08/04/2023 Creatinine 2.32, BUN 46, Potassium 4.3, Sodium 142, GFR 27 04/26/2023 Creatinine 2.08, BUN 45, Potassium 4.3, Sodium 141, GFR 31 04/11/2023 Creatinine 2.19, BUN 40, Potassium 3.3, Sodium 138, GFR 29  04/10/2023 Creatinine 2.31, BUN 38, Potassium 3.4, Sodium 139, GFR 28  04/09/2023 Creatinine 2.29, BUN 38, Potassium 3.5, Sodium 138, GFR 28  04/08/2023 Creatinine 2.20, BUN 40, Potassium 3.6, Sodium 135, GFR 29 03/30/2023 Creatinine 2.44, BUN 45, Potassium 3.6, Sodium 139, GFR 26  02/11/2023 Creatinine 2.30, BUN 43, Potassium 4.7, Sodium 143  A complete set of results can be found in Results Review   Recommendations:  Left voice mail with ICM number and encouraged to call if experiencing any fluid symptoms.   Follow-up plan: ICM clinic phone appointment on 09/13/2023.   91 day device clinic remote transmission 11/08/2023.     EP/Cardiology Office Visits:  Recall 08/19/2023 with Dr Lawana Pray.  Recall 11/29/2023  with Dr Audery Blazing.   Copy of ICM check sent to Dr. Lawana Pray.   3 month ICM trend: 08/10/2023.    12-14 Month ICM trend:     Almyra Jain, RN 08/10/2023 7:04 AM

## 2023-08-10 NOTE — Telephone Encounter (Signed)
 Remote ICM transmission received.  Attempted call to patient regarding ICM remote transmission and left detailed message per DPR.  Left ICM phone number and advised to return call for any fluid symptoms or questions. Next ICM remote transmission scheduled 09/13/2023.

## 2023-08-11 ENCOUNTER — Other Ambulatory Visit: Payer: Self-pay

## 2023-08-11 DIAGNOSIS — I5022 Chronic systolic (congestive) heart failure: Secondary | ICD-10-CM

## 2023-08-11 DIAGNOSIS — I255 Ischemic cardiomyopathy: Secondary | ICD-10-CM

## 2023-08-11 MED ORDER — ISOSORBIDE MONONITRATE ER 30 MG PO TB24: 60.0000 mg | ORAL_TABLET | Freq: Every day | ORAL | 3 refills | Status: AC

## 2023-08-17 ENCOUNTER — Telehealth: Payer: Self-pay | Admitting: Pharmacist

## 2023-08-17 ENCOUNTER — Encounter: Payer: Self-pay | Admitting: Pharmacy Technician

## 2023-08-17 ENCOUNTER — Telehealth: Payer: Self-pay | Admitting: Pharmacy Technician

## 2023-08-17 ENCOUNTER — Other Ambulatory Visit (HOSPITAL_COMMUNITY): Payer: Self-pay

## 2023-08-17 NOTE — Telephone Encounter (Signed)
 Pt walked in to clinic stating that when he went to pharmacy to pick up his Repatha  prescription, his cost was greater than $400. He was previously enrolled in [facility] for this per chart. Advised him that it's likely expired and needs to be renewed. Will send to prior auth team/med assistance team to see if this can be renewed. Informed him that they usually contact his filling retail pharmacy, but that we would follow-up with him within 72 hours. Pt very appreciative.

## 2023-08-17 NOTE — Telephone Encounter (Signed)
 Patient Advocate Encounter   The patient was approved for a [employer] grant that will help cover the cost of repatha  Total amount awarded, 2500.  Effective: 07/13/23 - 07/11/24   [id number] GEX:[id number] [id number] [id number] [employer] ID: [id number]   Pharmacy provided with approval and processing information. Patient informed via mychart/telephone

## 2023-09-13 ENCOUNTER — Ambulatory Visit: Attending: Cardiology

## 2023-09-13 DIAGNOSIS — I5022 Chronic systolic (congestive) heart failure: Secondary | ICD-10-CM | POA: Diagnosis not present

## 2023-09-13 DIAGNOSIS — Z95810 Presence of automatic (implantable) cardiac defibrillator: Secondary | ICD-10-CM

## 2023-09-15 ENCOUNTER — Telehealth: Payer: Self-pay

## 2023-09-15 NOTE — Telephone Encounter (Signed)
Remote ICM transmission received.  Attempted call to patient regarding ICM remote transmission and mail box is full. 

## 2023-09-15 NOTE — Progress Notes (Signed)
 EPIC Encounter for ICM Monitoring  Patient Name: Dave Daniels is a 83 y.o. male Date: 09/15/2023 Primary Care Physican: Lieutenant Reese., MD Primary Cardiologist: Audery Blazing Electrophysiologist: Lawana Pray Nephrologist: [facility] Bi-V Pacing: 98%    07/03/2022 Weight: 150 lbs 10/16/2022 Weight: 142 -143 lbs lbs 12/28/2022 Weight: 142 lbs  04/22/2023 Weight: 138 lbs lbs    08/10/2023 Weight: unknown lbs   AT/AF Burden 4.3% (taking Eliquis )                                    Attempted call to patient and unable to reach.  Transmission results reviewed.    Diet:  Not reviewed.    Corvue thoracic impedance suggesting normal fluid levels since 5/6.   Prescribed:  Torsemide  20 mg take 1 tablet(s) (20 mg total) by mouth twice a day.    Potassium 20 mEq take 2 tablets (40 mEq total) by mouth once a day      Labs: 08/04/2023 Creatinine 2.32, BUN 46, Potassium 4.3, Sodium 142, GFR 27 04/26/2023 Creatinine 2.08, BUN 45, Potassium 4.3, Sodium 141, GFR 31 A complete set of results can be found in Results Review   Recommendations:  Unable to reach.     Follow-up plan: ICM clinic phone appointment on 11/01/2023/2025.   91 day device clinic remote transmission 11/08/2023.     EP/Cardiology Office Visits:  Recall 08/19/2023 with Dr Lawana Pray.  Recall 11/29/2023 with Dr Audery Blazing.   Copy of ICM check sent to Dr. Lawana Pray.   3 month ICM trend: 09/15/2023.    12-14 Month ICM trend:     Almyra Jain, RN 09/15/2023 1:07 PM

## 2023-09-29 NOTE — Progress Notes (Signed)
 Remote ICD transmission.

## 2023-11-01 ENCOUNTER — Other Ambulatory Visit: Payer: Self-pay | Admitting: Cardiology

## 2023-11-01 ENCOUNTER — Ambulatory Visit: Attending: Cardiology

## 2023-11-01 DIAGNOSIS — I5022 Chronic systolic (congestive) heart failure: Secondary | ICD-10-CM | POA: Diagnosis not present

## 2023-11-01 DIAGNOSIS — Z95810 Presence of automatic (implantable) cardiac defibrillator: Secondary | ICD-10-CM | POA: Diagnosis not present

## 2023-11-03 NOTE — Progress Notes (Signed)
 EPIC Encounter for ICM Monitoring  Patient Name: Dave Daniels is a 83 y.o. male Date: 11/03/2023 Primary Care Physican: Andrew Truman GRADE., MD Primary Cardiologist: Pietro Electrophysiologist: Inocencio Nephrologist: [facility] Bi-V Pacing: 98%    10/27/2023 Office Weight: 141 lbs   AT/AF Burden: 3.6% (taking Eliquis )                                    Transmission results reviewed.    Diet:  Not reviewed.    Corvue thoracic impedance suggesting normal fluid levels within the last month.   Prescribed:  Torsemide  20 mg take 1 tablet(s) (20 mg total) by mouth twice a day.    Potassium 20 mEq take 2 tablets (40 mEq total) by mouth once a day      Labs: 10/25/2023 Creatinine 2.40, BUN 49, Potassium 4.2, Sodium 140, GFR 26 08/04/2023 Creatinine 2.32, BUN 46, Potassium 4.3, Sodium 142, GFR 27 04/26/2023 Creatinine 2.08, BUN 45, Potassium 4.3, Sodium 141, GFR 31 A complete set of results can be found in Results Review   Recommendations:  No changes.     Follow-up plan: ICM clinic phone appointment on 12/06/2023/2025.   91 day device clinic remote transmission 02/07/2024.     EP/Cardiology Office Visits:  Recall 08/19/2023 with Dr Inocencio.  Recall 11/29/2023 with Dr Pietro.   Copy of ICM check sent to Dr. Inocencio.    3 month ICM trend: 11/01/2023.    12-14 Month ICM trend:     Dave GORMAN Garner, RN 11/03/2023 5:05 PM

## 2023-11-08 ENCOUNTER — Ambulatory Visit (INDEPENDENT_AMBULATORY_CARE_PROVIDER_SITE_OTHER): Payer: Medicare HMO

## 2023-11-08 DIAGNOSIS — I5022 Chronic systolic (congestive) heart failure: Secondary | ICD-10-CM

## 2023-11-09 LAB — CUP PACEART REMOTE DEVICE CHECK
Battery Remaining Longevity: 58 mo
Battery Remaining Percentage: 76 %
Battery Voltage: 2.98 V
Brady Statistic AP VP Percent: 81 %
Brady Statistic AP VS Percent: 1 %
Brady Statistic AS VP Percent: 17 %
Brady Statistic AS VS Percent: 1 %
Brady Statistic RA Percent Paced: 77 %
Date Time Interrogation Session: 20250728020103
HighPow Impedance: 57 Ohm
Implantable Lead Connection Status: 753985
Implantable Lead Connection Status: 753985
Implantable Lead Connection Status: 753985
Implantable Lead Implant Date: 20121219
Implantable Lead Implant Date: 20121219
Implantable Lead Implant Date: 20121219
Implantable Lead Location: 753858
Implantable Lead Location: 753859
Implantable Lead Location: 753860
Implantable Pulse Generator Implant Date: 20240126
Lead Channel Impedance Value: 310 Ohm
Lead Channel Impedance Value: 430 Ohm
Lead Channel Impedance Value: 650 Ohm
Lead Channel Pacing Threshold Amplitude: 0.75 V
Lead Channel Pacing Threshold Amplitude: 1 V
Lead Channel Pacing Threshold Amplitude: 2.5 V
Lead Channel Pacing Threshold Pulse Width: 0.5 ms
Lead Channel Pacing Threshold Pulse Width: 0.5 ms
Lead Channel Pacing Threshold Pulse Width: 0.6 ms
Lead Channel Sensing Intrinsic Amplitude: 12 mV
Lead Channel Sensing Intrinsic Amplitude: 3.3 mV
Lead Channel Setting Pacing Amplitude: 2 V
Lead Channel Setting Pacing Amplitude: 2.5 V
Lead Channel Setting Pacing Amplitude: 3 V
Lead Channel Setting Pacing Pulse Width: 0.5 ms
Lead Channel Setting Pacing Pulse Width: 0.6 ms
Lead Channel Setting Sensing Sensitivity: 0.5 mV
Pulse Gen Serial Number: 211014942

## 2023-11-10 ENCOUNTER — Ambulatory Visit: Payer: Self-pay | Admitting: Cardiology

## 2023-12-06 ENCOUNTER — Ambulatory Visit: Attending: Cardiology

## 2023-12-06 DIAGNOSIS — I5022 Chronic systolic (congestive) heart failure: Secondary | ICD-10-CM | POA: Diagnosis not present

## 2023-12-06 DIAGNOSIS — Z95810 Presence of automatic (implantable) cardiac defibrillator: Secondary | ICD-10-CM | POA: Diagnosis not present

## 2023-12-09 NOTE — Progress Notes (Signed)
 EPIC Encounter for ICM Monitoring  Patient Name: Dave Daniels is a 83 y.o. male Date: 12/09/2023 Primary Care Physican: Andrew Truman GRADE., MD Primary Cardiologist: Pietro Electrophysiologist: Inocencio Nephrologist: [facility] Bi-V Pacing: 98%    10/27/2023 Office Weight: 141 lbs   AT/AF Burden: 3.6% (taking Eliquis )                                    Transmission results reviewed.    Diet:  Not reviewed.    Corvue thoracic impedance suggesting normal fluid levels within the last month.   Prescribed:  Torsemide  20 mg take 1 tablet(s) (20 mg total) by mouth twice a day.    Potassium 20 mEq take 2 tablets (40 mEq total) by mouth once a day      Labs: 10/25/2023 Creatinine 2.40, BUN 49, Potassium 4.2, Sodium 140, GFR 26 08/04/2023 Creatinine 2.32, BUN 46, Potassium 4.3, Sodium 142, GFR 27 04/26/2023 Creatinine 2.08, BUN 45, Potassium 4.3, Sodium 141, GFR 31 A complete set of results can be found in Results Review   Recommendations:  No changes.     Follow-up plan: ICM clinic phone appointment on 01/10/2024.   91 day device clinic remote transmission 02/07/2024.     EP/Cardiology Office Visits:  Message sent to EP scheduler to contact patient for overdue appt.   Recall 08/19/2023 with Dr Inocencio.  Recall 11/29/2023 with Dr Pietro.   Copy of ICM check sent to Dr. Inocencio.    3 month ICM trend: 12/06/2023.    12-14 Month ICM trend:     Dave GORMAN Garner, RN 12/09/2023 8:24 AM

## 2024-01-02 ENCOUNTER — Other Ambulatory Visit: Payer: Self-pay | Admitting: Cardiology

## 2024-01-05 ENCOUNTER — Other Ambulatory Visit: Payer: Self-pay | Admitting: Cardiology

## 2024-01-05 DIAGNOSIS — I480 Paroxysmal atrial fibrillation: Secondary | ICD-10-CM

## 2024-01-05 NOTE — Telephone Encounter (Signed)
 Prescription refill request for Eliquis  received. Indication:afib Last office visit:2/25 Scr:2.32  4/25 Age: 83 Weight:62.6  kg  Prescription refilled

## 2024-01-10 ENCOUNTER — Ambulatory Visit: Attending: Cardiology

## 2024-01-10 DIAGNOSIS — Z95810 Presence of automatic (implantable) cardiac defibrillator: Secondary | ICD-10-CM

## 2024-01-10 DIAGNOSIS — I5022 Chronic systolic (congestive) heart failure: Secondary | ICD-10-CM | POA: Diagnosis not present

## 2024-01-11 ENCOUNTER — Telehealth: Payer: Self-pay

## 2024-01-11 NOTE — Telephone Encounter (Signed)
   Pre-operative Risk Assessment    Patient Name: Dave Daniels  DOB: Dec 13, 1940 MRN: [id number]   Date of last office visit: 06/02/23 Date of next office visit: 02/22/24   Request for Surgical Clearance    Procedure:  Open L Inguinal  Date of Surgery:  Clearance 02/28/24                                Surgeon:  Prentice Pizza, MD Surgeon's Group or Practice Name:  [facility] Phone number:  [phone] Fax number:  [phone]   Type of Clearance Requested:   - Medical  - Pharmacy:  Hold Apixaban  (Eliquis ) x 2 days prior   Type of Anesthesia:  Spinal   Additional requests/questions:    Bonney Ival LOISE Gerome   01/11/2024, 4:24 PM

## 2024-01-12 NOTE — Progress Notes (Signed)
 EPIC Encounter for ICM Monitoring  Patient Name: Dave Daniels is a 83 y.o. male Date: 01/12/2024 Primary Care Physican: Andrew Truman GRADE., MD Primary Cardiologist: Pietro Electrophysiologist: Inocencio Nephrologist: [facility] Bi-V Pacing: 98%    10/27/2023 Office Weight: 141 lbs   AT/AF Burden: 2.8% (taking Eliquis )                                    Transmission results reviewed.    Diet:  Not reviewed.    Corvue thoracic impedance suggesting normal fluid levels within the last month.   Prescribed:  Torsemide  20 mg take 1 tablet(s) (20 mg total) by mouth twice a day.    Potassium 20 mEq take 2 tablets (40 mEq total) by mouth once a day      Labs: 10/25/2023 Creatinine 2.40, BUN 49, Potassium 4.2, Sodium 140, GFR 26 08/04/2023 Creatinine 2.32, BUN 46, Potassium 4.3, Sodium 142, GFR 27 04/26/2023 Creatinine 2.08, BUN 45, Potassium 4.3, Sodium 141, GFR 31 A complete set of results can be found in Results Review   Recommendations:  No changes.     Follow-up plan: ICM clinic phone appointment on 02/21/2024.   91 day device clinic remote transmission 02/07/2024.     EP/Cardiology Office Visits:  02/22/2024 with Dr Inocencio.  Recall 11/29/2023 with Dr Pietro.   Copy of ICM check sent to Dr. Inocencio.    Remote monitoring is medically necessary for Heart Failure Management.    90 day Daily Thoracic Impedance ICM trend: 10/12/2023 through 01/10/2024.    12-14 Month Thoracic Impedance ICM trend:     Mitzie GORMAN Garner, RN 01/12/2024 4:49 PM

## 2024-01-13 NOTE — Progress Notes (Signed)
 Remote ICD Transmission

## 2024-01-18 ENCOUNTER — Telehealth: Payer: Self-pay

## 2024-01-18 NOTE — Telephone Encounter (Signed)
 Appointment scheduled for 02/16/2024 @ 3:20. Med req and consent are complete. Call patient at [phone].

## 2024-01-18 NOTE — Telephone Encounter (Signed)
  Patient Consent for Virtual Visit         Dave Daniels has provided verbal consent on 01/18/2024 for a virtual visit (video or telephone). Appointment scheduled for 02/16/2024 @ 3:20. Med req and consent are complete. Call patient at [phone].    CONSENT FOR VIRTUAL VISIT FOR:  Dave Daniels  By participating in this virtual visit I agree to the following:  I hereby voluntarily request, consent and authorize [facility] and its employed or contracted physicians, physician assistants, nurse practitioners or other licensed health care professionals (the Practitioner), to provide me with telemedicine health care services (the "Services) as deemed necessary by the treating Practitioner. I acknowledge and consent to receive the Services by the Practitioner via telemedicine. I understand that the telemedicine visit will involve communicating with the Practitioner through live audiovisual communication technology and the disclosure of certain medical information by electronic transmission. I acknowledge that I have been given the opportunity to request an in-person assessment or other available alternative prior to the telemedicine visit and am voluntarily participating in the telemedicine visit.  I understand that I have the right to withhold or withdraw my consent to the use of telemedicine in the course of my care at any time, without affecting my right to future care or treatment, and that the Practitioner or I may terminate the telemedicine visit at any time. I understand that I have the right to inspect all information obtained and/or recorded in the course of the telemedicine visit and may receive copies of available information for a reasonable fee.  I understand that some of the potential risks of receiving the Services via telemedicine include:  Delay or interruption in medical evaluation due to technological equipment failure or disruption; Information transmitted may not be  sufficient (e.g. poor resolution of images) to allow for appropriate medical decision making by the Practitioner; and/or  In rare instances, security protocols could fail, causing a breach of personal health information.  Furthermore, I acknowledge that it is my responsibility to provide information about my medical history, conditions and care that is complete and accurate to the best of my ability. I acknowledge that Practitioner's advice, recommendations, and/or decision may be based on factors not within their control, such as incomplete or inaccurate data provided by me or distortions of diagnostic images or specimens that may result from electronic transmissions. I understand that the practice of medicine is not an exact science and that Practitioner makes no warranties or guarantees regarding treatment outcomes. I acknowledge that a copy of this consent can be made available to me via my patient portal [facility] MyChart), or I can request a printed copy by calling the office of [facility].    I understand that my insurance will be billed for this visit.   I have read or had this consent read to me. I understand the contents of this consent, which adequately explains the benefits and risks of the Services being provided via telemedicine.  I have been provided ample opportunity to ask questions regarding this consent and the Services and have had my questions answered to my satisfaction. I give my informed consent for the services to be provided through the use of telemedicine in my medical care

## 2024-01-18 NOTE — Telephone Encounter (Signed)
 Patient with diagnosis of Afib on Eliquis  for anticoagulation.    Procedure:  Open L Inguinal   Date of Surgery:  Clearance 02/28/24         CHA2DS2-VASc Score = 5   This indicates a 7.2% annual risk of stroke. The patient's score is based upon: CHF History: 1 HTN History: 1 Diabetes History: 0 Stroke History: 0 Vascular Disease History: 1 Age Score: 2 Gender Score: 0    CrCl 23 ml/min Platelet count 165 K  Patient has not had an Afib/aflutter ablation within the last 3 months or DCCV within the last 30 days  Per office protocol, patient can hold Eliquis  for 3 days prior to procedure.    Team is requesting 2 day hold but per our protocol, 3 day hold for all spinal procedures. Thank you.  Patient will not need bridging with Lovenox (enoxaparin) around procedure.  **This guidance is not considered finalized until pre-operative APP has relayed final recommendations.**

## 2024-01-18 NOTE — Telephone Encounter (Signed)
   Name: Dave Daniels  DOB: 20-Jan-1941  MRN: [id number]  Primary Cardiologist: Redell Shallow, MD   Preoperative team, please contact this patient and set up a phone call appointment for further preoperative risk assessment. Please obtain consent and complete medication review. Thank you for your help.  I confirm that guidance regarding antiplatelet and oral anticoagulation therapy has been completed and, if necessary, noted below.  Per office protocol, patient can hold Eliquis  for 3 days prior to procedure.     Team is requesting 2 day hold but per our protocol, 3 day hold for all spinal procedures. Thank you.   Patient will not need bridging with Lovenox (enoxaparin) around procedure  I also confirmed the patient resides in the state of [place] . As per [facility] Medical Board telemedicine laws, the patient must reside in the state in which the provider is licensed.    Barnie Hila, NP 01/18/2024, 11:59 AM [facility]

## 2024-01-20 ENCOUNTER — Encounter: Admitting: Cardiology

## 2024-02-07 ENCOUNTER — Ambulatory Visit (INDEPENDENT_AMBULATORY_CARE_PROVIDER_SITE_OTHER): Payer: Medicare HMO

## 2024-02-07 DIAGNOSIS — I5022 Chronic systolic (congestive) heart failure: Secondary | ICD-10-CM

## 2024-02-08 ENCOUNTER — Ambulatory Visit: Payer: Self-pay | Admitting: Cardiology

## 2024-02-08 LAB — CUP PACEART REMOTE DEVICE CHECK
Battery Remaining Longevity: 56 mo
Battery Remaining Percentage: 73 %
Battery Voltage: 2.98 V
Brady Statistic AP VP Percent: 81 %
Brady Statistic AP VS Percent: 1 %
Brady Statistic AS VP Percent: 17 %
Brady Statistic AS VS Percent: 1 %
Brady Statistic RA Percent Paced: 78 %
Date Time Interrogation Session: 20251027021013
HighPow Impedance: 63 Ohm
Implantable Lead Connection Status: 753985
Implantable Lead Connection Status: 753985
Implantable Lead Connection Status: 753985
Implantable Lead Implant Date: 20121219
Implantable Lead Implant Date: 20121219
Implantable Lead Implant Date: 20121219
Implantable Lead Location: 753858
Implantable Lead Location: 753859
Implantable Lead Location: 753860
Implantable Pulse Generator Implant Date: 20240126
Lead Channel Impedance Value: 310 Ohm
Lead Channel Impedance Value: 410 Ohm
Lead Channel Impedance Value: 660 Ohm
Lead Channel Pacing Threshold Amplitude: 0.75 V
Lead Channel Pacing Threshold Amplitude: 1 V
Lead Channel Pacing Threshold Amplitude: 2.625 V
Lead Channel Pacing Threshold Pulse Width: 0.5 ms
Lead Channel Pacing Threshold Pulse Width: 0.5 ms
Lead Channel Pacing Threshold Pulse Width: 0.6 ms
Lead Channel Sensing Intrinsic Amplitude: 12 mV
Lead Channel Sensing Intrinsic Amplitude: 3.2 mV
Lead Channel Setting Pacing Amplitude: 2 V
Lead Channel Setting Pacing Amplitude: 2.5 V
Lead Channel Setting Pacing Amplitude: 3.125
Lead Channel Setting Pacing Pulse Width: 0.5 ms
Lead Channel Setting Pacing Pulse Width: 0.6 ms
Lead Channel Setting Sensing Sensitivity: 0.5 mV
Pulse Gen Serial Number: 211014942

## 2024-02-15 NOTE — Progress Notes (Signed)
 Remote ICD Transmission

## 2024-02-16 ENCOUNTER — Ambulatory Visit: Attending: Cardiology | Admitting: Physician Assistant

## 2024-02-16 DIAGNOSIS — Z01810 Encounter for preprocedural cardiovascular examination: Secondary | ICD-10-CM | POA: Diagnosis not present

## 2024-02-16 NOTE — Progress Notes (Signed)
 Virtual Visit via Telephone Note   Because of ARIANA Daniels co-morbid illnesses, he is at least at moderate risk for complications without adequate follow up.  This format is felt to be most appropriate for this patient at this time.  Due to technical limitations with video connection (technology), today's appointment will be conducted as an audio only telehealth visit, and Dave Daniels verbally agreed to proceed in this manner.   All issues noted in this document were discussed and addressed.  No physical exam could be performed with this format.  Evaluation Performed:  Preoperative cardiovascular risk assessment _____________   Date:  02/16/2024   Patient ID:  Dave Daniels, DOB 08-Jun-1940, MRN [id number] Patient Location:  Home Provider location:   Office  Primary Care Provider:  Andrew Truman GRADE., MD Primary Cardiologist:  Redell Shallow, MD  Chief Complaint / Patient Profile   83 y.o. y/o male with a h/o atrial fibrillation, coronary artery disease status post coronary bypass grafting as well as ischemic cardiomyopathy/CHF with cardiac history dating back to 2010 with first MI for which she had stents placed in [place] and second MI March 2012 for which she had a coronary bypass grafting who is pending open L inguinal hernia repair and presents today for telephonic preoperative cardiovascular risk assessment.  History of Present Illness    Dave Daniels is a 83 y.o. male who presents via audio/video conferencing for a telehealth visit today.  Pt was last seen in cardiology clinic on 06/02/2023 by Dr. Shallow.  At that time Dave Daniels was doing well.  Chest CT December 2024 showed right pleural effusion, dilated pulmonary artery suggestive of pulmonary hypertension and small volume ascites.  The patient is now pending procedure as outlined above. Since his last visit, he says that he has been doing well from a heart standpoint, so far so good.  He has not had any  swelling in his feet or ankles.  No shortness of breath or chest pain.  Weight has been stable and he only fluctuates by a few pounds.  He notes that he has been very dry.  He does meet minimum METS on the DASI.  Per office protocol, patient can hold Eliquis  for 2 days prior to procedure. He can resume when medically safe to do so.    Past Medical History    Past Medical History:  Diagnosis Date   CAD (coronary artery disease)    MI in [place] with stenting in 2010, then MI with CABG in [place] 06/2010.  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   Complete heart block (HCC)    Gout    Hyperlipidemia    Hypertension    Ischemic cardiomyopathy    EF 15% by echo 11/2010 and still 20-25% by follow-up echo 02/2011, s/p [employer] Jude Bi-V ICD implantation 04/01/11   LBBB (left bundle branch block)    Renal insufficiency    Cr 1.6 on 03/25/11   Past Surgical History:  Procedure Laterality Date   BI-VENTRICULAR IMPLANTABLE CARDIOVERTER DEFIBRILLATOR N/A 04/01/2011   Procedure: BI-VENTRICULAR IMPLANTABLE CARDIOVERTER DEFIBRILLATOR  (CRT-D);  Surgeon: Danelle LELON Birmingham, MD;  Location: [facility] CATH LAB;  Service: Cardiovascular;  Laterality: N/A;   BIV ICD GENERATOR CHANGEOUT N/A 05/08/2022   Procedure: BIV ICD GENERATOR CHANGEOUT;  Surgeon: Inocencio Soyla Lunger, MD;  Location: [facility] INVASIVE CV LAB;  Service: Cardiovascular;  Laterality: N/A;   CARDIAC DEFIBRILLATOR PLACEMENT  12/12   BiV ICD (SJM) implanted by Dr Kelsie   CARDIOVERSION  N/A 04/22/2018   Procedure: CARDIOVERSION;  Surgeon: Shlomo Wilbert SAUNDERS, MD;  Location: [facility] ENDOSCOPY;  Service: Cardiovascular;  Laterality: N/A;   CARDIOVERSION N/A 06/13/2019   Procedure: CARDIOVERSION;  Surgeon: Shlomo Wilbert SAUNDERS, MD;  Location: [facility] ENDOSCOPY;  Service: Cardiovascular;  Laterality: N/A;   CARDIOVERSION N/A 02/11/2023   Procedure: CARDIOVERSION (CATH LAB);  Surgeon: Tobb, Kardie, DO;  Location: [facility] INVASIVE CV LAB;  Service: Cardiovascular;  Laterality: N/A;   Carpel tunnel  surgery     CORONARY ARTERY BYPASS GRAFT  3/12   in [place]   EP IMPLANTABLE DEVICE N/A 08/19/2015   Procedure: BIV ICD Generator Changeout;  Surgeon: Lynwood Rakers, MD;  Location: [facility] [place] [facility] INVASIVE CV LAB;  Service: Cardiovascular;  Laterality: N/A;   INGUINAL HERNIA REPAIR     TONSILLECTOMY     UMBILICAL HERNIA REPAIR      Allergies  Allergies  Allergen Reactions   Statins Hives    Muscle pain & severe hives   Nsaids Other (See Comments)    Renal insufficiency     Tolmetin Other (See Comments)    Renal insufficiency   Atorvastatin Rash   Pravastatin  Rash    rash   Shellfish Allergy Rash    Home Medications    Prior to Admission medications   Medication Sig Start Date End Date Taking? Authorizing Provider  acetaminophen  (TYLENOL ) 500 MG tablet Take 1,000 mg by mouth as needed for moderate pain or headache.     [provider]  allopurinol  (ZYLOPRIM ) 300 MG tablet Take 300 mg by mouth daily as needed (Gout). 12/31/17   [provider]  apixaban  (ELIQUIS ) 2.5 MG TABS tablet Take 1 tablet by mouth twice daily 01/05/24   Camnitz, Soyla Lunger, MD  carvedilol  (COREG ) 12.5 MG tablet TAKE 1 TABLET BY MOUTH TWICE DAILY WITH A MEAL 06/21/23   Pietro Redell RAMAN, MD  cholecalciferol (VITAMIN D3) 25 MCG (1000 UNIT) tablet Take 1,000 Units by mouth daily.    [provider]  colchicine  0.6 MG tablet Take 0.6 mg by mouth as needed (Gout).    [provider]  Evolocumab  (REPATHA  SURECLICK) 140 MG/ML SOAJ INJECT 1  SUBCUTANEOUSLY EVERY TWO WEEKS 11/02/23   Pietro Redell RAMAN, MD  ezetimibe  (ZETIA ) 10 MG tablet Take 1 tablet by mouth once daily 01/04/24   Pietro Redell RAMAN, MD  finasteride  (PROSCAR ) 5 MG tablet Take 5 mg by mouth daily.    [provider]  hydrALAZINE  (APRESOLINE ) 25 MG tablet Take 1 tablet (25 mg total) by mouth 3 (three) times daily. 06/16/23   Pietro Redell RAMAN, MD  isosorbide  mononitrate (IMDUR ) 30 MG 24 hr tablet Take 2 tablets (60 mg total) by  mouth daily. 08/11/23   Pietro Redell RAMAN, MD  oxybutynin  (DITROPAN -XL) 10 MG 24 hr tablet Take 10 mg by mouth daily.    [provider]  potassium chloride  SA (KLOR-CON  M) 20 MEQ tablet Take 2 tablets (40 mEq total) by mouth daily. 05/07/23   Pietro Redell RAMAN, MD  tamsulosin  (FLOMAX ) 0.4 MG CAPS capsule Take 0.4 mg by mouth daily. 02/03/22   [provider]  torsemide  (DEMADEX ) 20 MG tablet Take 1 tablet (20 mg total) by mouth 2 (two) times daily. 06/16/23   Pietro Redell RAMAN, MD    Physical Exam    Vital Signs:  Sadiq A Aguillard does not have vital signs available for review today.  Given telephonic nature of communication, physical exam is limited. AAOx3. NAD. Normal affect.  Speech and respirations  are unlabored.  Accessory Clinical Findings    None  Assessment & Plan    1.  Preoperative Cardiovascular Risk Assessment:  Dave Daniels perioperative risk of a major cardiac event is 11% according to the Revised Cardiac Risk Index (RCRI).  Therefore, he is at high risk for perioperative complications.   His functional capacity is good at 5.62 METs according to the Duke Activity Status Index (DASI). Recommendations: According to ACC/AHA guidelines, no further cardiovascular testing needed.  The patient may proceed to surgery at acceptable risk.   Antiplatelet and/or Anticoagulation Recommendations:  Eliquis  (Apixaban ) can be held for 2 days prior to surgery.  Please resume post op when felt to be safe.     The patient was advised that if he develops new symptoms prior to surgery to contact our office to arrange for a follow-up visit, and he verbalized understanding.   A copy of this note will be routed to requesting surgeon.  Time:   Today, I have spent 7 minutes with the patient with telehealth technology discussing medical history, symptoms, and management plan.     Dave LOISE Fabry, PA-C  02/16/2024, 3:45 PM

## 2024-02-16 NOTE — Telephone Encounter (Signed)
 Procedure will not be using spinal anesthesia, so 2 day hold is acceptable.

## 2024-02-21 ENCOUNTER — Ambulatory Visit: Attending: Cardiology

## 2024-02-21 DIAGNOSIS — Z95810 Presence of automatic (implantable) cardiac defibrillator: Secondary | ICD-10-CM | POA: Diagnosis not present

## 2024-02-21 DIAGNOSIS — I5022 Chronic systolic (congestive) heart failure: Secondary | ICD-10-CM | POA: Diagnosis not present

## 2024-02-21 NOTE — Progress Notes (Unsigned)
 EPIC Encounter for ICM Monitoring  Patient Name: Dave Daniels is a 83 y.o. male Date: 02/21/2024 Primary Care Physican: Andrew Truman GRADE., MD Primary Cardiologist: Pietro Electrophysiologist: Inocencio Nephrologist: [facility] Bi-V Pacing: 99%    10/27/2023 Office Weight: 141 lbs 02/21/2024 Weight: 138-142 lbs   AT/AF Burden: 2.5% (taking Eliquis )                                    Spoke with patient and heart failure questions reviewed.  Transmission results reviewed.  Pt asymptomatic for fluid accumulation.  Reports feeling well at this time.  He reports missing 2 doses of Torsemide  in the last week but denies any changes in meds or diet.     Diet:  No changes in diet.    Since 01/10/2024 ICM Remote Transmission: Corvue thoracic impedance suggesting normal possible fluid accumulation starting 02/16/2024.   Prescribed:  Torsemide  20 mg take 1 tablet(s) (20 mg total) by mouth twice a day.    Potassium 20 mEq take 2 tablets (40 mEq total) by mouth once a day      Labs: 10/25/2023 Creatinine 2.40, BUN 49, Potassium 4.2, Sodium 140, GFR 26 08/04/2023 Creatinine 2.32, BUN 46, Potassium 4.3, Sodium 142, GFR 27 04/26/2023 Creatinine 2.08, BUN 45, Potassium 4.3, Sodium 141, GFR 31 A complete set of results can be found in Results Review   Recommendations:  Advised to follow low salt diet and limit fluid intake to 64 oz daily.  Copy sent to Dr Pietro for review and recommendations if needed.   Pt has surgical procedure scheduled 02/28/2024.   Follow-up plan: ICM clinic phone appointment on 03/06/2024 to recheck fluid levels.   91 day device clinic remote transmission 05/08/2024.     EP/Cardiology Office Visits:  03/22/2024 with Dr Inocencio.  Recall 11/29/2023 with Dr Pietro.   Copy of ICM check sent to Dr. Inocencio.    Remote monitoring is medically necessary for Heart Failure Management.    Daily Thoracic Impedance ICM trend: 11/23/2023 through  02/21/2024.    12-14 Month Thoracic Impedance ICM trend:     Mitzie GORMAN Garner, RN 02/21/2024 3:41 PM

## 2024-02-22 ENCOUNTER — Ambulatory Visit: Admitting: Cardiology

## 2024-02-23 NOTE — Progress Notes (Signed)
 No recommendations received as of 02/23/2024.

## 2024-03-06 ENCOUNTER — Ambulatory Visit: Attending: Cardiology

## 2024-03-06 DIAGNOSIS — I5022 Chronic systolic (congestive) heart failure: Secondary | ICD-10-CM

## 2024-03-06 DIAGNOSIS — Z95810 Presence of automatic (implantable) cardiac defibrillator: Secondary | ICD-10-CM

## 2024-03-07 ENCOUNTER — Telehealth: Payer: Self-pay

## 2024-03-07 NOTE — Telephone Encounter (Signed)
Remote ICM transmission received.  Attempted call to patient regarding ICM remote transmission and mail box is full. 

## 2024-03-07 NOTE — Progress Notes (Signed)
 EPIC Encounter for ICM Monitoring  Patient Name: Dave Daniels is a 83 y.o. male Date: 03/07/2024 Primary Care Physican: Andrew Truman GRADE., MD Primary Cardiologist: Pietro Electrophysiologist: Inocencio Nephrologist: [facility] Bi-V Pacing: 99%    10/27/2023 Office Weight: 141 lbs 02/21/2024 Weight: 138-142 lbs   AT/AF Burden: 2.4% (taking Eliquis )                                    Attempted call to patient and unable to reach.   Transmission results reviewed.      Diet:  No changes in diet.    Since 02/21/2024 ICM Remote Transmission: Corvue thoracic impedance suggesting fluid levels returned to normal 02/22/2024.   Prescribed:  Torsemide  20 mg take 1 tablet(s) (20 mg total) by mouth twice a day.    Potassium 20 mEq take 2 tablets (40 mEq total) by mouth once a day      Labs: 10/25/2023 Creatinine 2.40, BUN 49, Potassium 4.2, Sodium 140, GFR 26 08/04/2023 Creatinine 2.32, BUN 46, Potassium 4.3, Sodium 142, GFR 27 04/26/2023 Creatinine 2.08, BUN 45, Potassium 4.3, Sodium 141, GFR 31 A complete set of results can be found in Results Review   Recommendations:  Unable to reach.     Follow-up plan: ICM clinic phone appointment on 04/17/2024.   91 day device clinic remote transmission 05/08/2024.     EP/Cardiology Office Visits:  03/22/2024 with Dr Inocencio.  Recall 11/29/2023 with Dr Pietro.   Copy of ICM check sent to Dr. Inocencio.    Remote monitoring is medically necessary for Heart Failure Management.    Daily Thoracic Impedance ICM trend: 12/07/2023 through 03/06/2024.    12-14 Month Thoracic Impedance ICM trend:     Mitzie GORMAN Garner, RN 03/07/2024 2:25 PM

## 2024-03-22 ENCOUNTER — Ambulatory Visit: Payer: Self-pay | Admitting: Cardiology

## 2024-03-22 ENCOUNTER — Encounter: Payer: Self-pay | Admitting: Cardiology

## 2024-03-22 ENCOUNTER — Ambulatory Visit: Attending: Cardiology | Admitting: Cardiology

## 2024-03-22 VITALS — BP 130/70 | HR 60 | Ht 67.0 in | Wt 141.0 lb

## 2024-03-22 DIAGNOSIS — I5022 Chronic systolic (congestive) heart failure: Secondary | ICD-10-CM | POA: Diagnosis not present

## 2024-03-22 DIAGNOSIS — I255 Ischemic cardiomyopathy: Secondary | ICD-10-CM | POA: Diagnosis not present

## 2024-03-22 DIAGNOSIS — I4819 Other persistent atrial fibrillation: Secondary | ICD-10-CM

## 2024-03-22 DIAGNOSIS — Z95810 Presence of automatic (implantable) cardiac defibrillator: Secondary | ICD-10-CM | POA: Diagnosis not present

## 2024-03-22 DIAGNOSIS — I4892 Unspecified atrial flutter: Secondary | ICD-10-CM

## 2024-03-22 LAB — CUP PACEART INCLINIC DEVICE CHECK
Battery Remaining Longevity: 52 mo
Brady Statistic RA Percent Paced: 78 %
Brady Statistic RV Percent Paced: 99 %
Date Time Interrogation Session: 20251210160900
HighPow Impedance: 67.5 Ohm
Implantable Lead Connection Status: 753985
Implantable Lead Connection Status: 753985
Implantable Lead Connection Status: 753985
Implantable Lead Implant Date: 20121219
Implantable Lead Implant Date: 20121219
Implantable Lead Implant Date: 20121219
Implantable Lead Location: 753858
Implantable Lead Location: 753859
Implantable Lead Location: 753860
Implantable Pulse Generator Implant Date: 20240126
Lead Channel Impedance Value: 362.5 Ohm
Lead Channel Impedance Value: 475 Ohm
Lead Channel Impedance Value: 725 Ohm
Lead Channel Pacing Threshold Amplitude: 0.75 V
Lead Channel Pacing Threshold Amplitude: 0.75 V
Lead Channel Pacing Threshold Amplitude: 0.75 V
Lead Channel Pacing Threshold Amplitude: 0.75 V
Lead Channel Pacing Threshold Amplitude: 2.375 V
Lead Channel Pacing Threshold Pulse Width: 0.5 ms
Lead Channel Pacing Threshold Pulse Width: 0.5 ms
Lead Channel Pacing Threshold Pulse Width: 0.5 ms
Lead Channel Pacing Threshold Pulse Width: 0.5 ms
Lead Channel Pacing Threshold Pulse Width: 0.6 ms
Lead Channel Sensing Intrinsic Amplitude: 12 mV
Lead Channel Sensing Intrinsic Amplitude: 4.6 mV
Lead Channel Setting Pacing Amplitude: 2 V
Lead Channel Setting Pacing Amplitude: 2.5 V
Lead Channel Setting Pacing Amplitude: 3.375
Lead Channel Setting Pacing Pulse Width: 0.5 ms
Lead Channel Setting Pacing Pulse Width: 0.6 ms
Lead Channel Setting Sensing Sensitivity: 0.5 mV
Pulse Gen Serial Number: 211014942

## 2024-03-22 NOTE — Progress Notes (Signed)
 Electrophysiology Office Note:   Date:  03/22/2024  ID:  [name], DOB July 23, 1940, MRN [id number]  Primary Cardiologist: Dave Shallow, MD Primary Heart Failure: None Electrophysiologist: Dave Daniels Dave Norton, MD      History of Present Illness:   Dave Daniels is a 83 y.o. male with h/o coronary artery disease post CABG in 2012, hypertension, hyperlipidemia, chronic systolic heart failure, left bundle branch block, atrial fibrillation seen today for routine electrophysiology followup.   Discussed the use of AI scribe software for clinical note transcription with the patient, who gave verbal consent to proceed.  History of Present Illness Dave Daniels is an 83 year old male who presents for routine follow-up of his cardiac device.  He feels fine with no issues or symptoms to note. He is satisfied with his current health status and does not report any changes or concerns.  There have been no changes in his condition, and he has not experienced any symptoms.  No new symptoms or issues, confirming that he feels fine and has no complaints.  he denies chest pain, palpitations, dyspnea, PND, orthopnea, nausea, vomiting, dizziness, syncope, edema, weight gain, or early satiety.   Review of systems complete and found to be negative unless listed in HPI.      EP Information / Studies Reviewed:    EKG is ordered today. Personal review as below.  EKG Interpretation Date/Time:  Wednesday March 22 2024 14:16:08 EST Ventricular Rate:  60 PR Interval:  186 QRS Duration:  180 QT Interval:  492 QTC Calculation: 492 R Axis:   -87  Text Interpretation: AV dual-paced rhythm Biventricular pacemaker detected When compared with ECG of 08-Apr-2023 11:20, No significant change since last tracing Confirmed by Dave Daniels ([id number]) on 03/22/2024 2:51:05 PM   ICD Interrogation-  reviewed in detail today,  See PACEART report.  Device History: Abbott BiV ICD implanted 03/22/2011 with  generator change 08/19/2015 and 05/08/2022 for chronic systolic heart failure History of appropriate therapy: No History of AAD therapy: No   Risk Assessment/Calculations:    CHA2DS2-VASc Score = 5   This indicates a 7.2% annual risk of stroke. The patient's score is based upon: CHF History: 1 HTN History: 1 Diabetes History: 0 Stroke History: 0 Vascular Disease History: 1 Age Score: 2 Gender Score: 0            Physical Exam:   VS:  BP 130/70 (BP Location: Left Arm, Patient Position: Sitting, Cuff Size: Normal)   Pulse 60   Ht 5' 7 (1.702 m)   Wt 141 lb (64 kg)   SpO2 98%   BMI 22.08 kg/m    Wt Readings from Last 3 Encounters:  03/22/24 141 lb (64 kg)  06/02/23 138 lb (62.6 kg)  04/11/23 146 lb 6.2 oz (66.4 kg)     GEN: Well nourished, well developed in no acute distress NECK: No JVD; No carotid bruits CARDIAC: Regular rate and rhythm, no murmurs, rubs, gallops RESPIRATORY:  Clear to auscultation without rales, wheezing or rhonchi  ABDOMEN: Soft, non-tender, non-distended EXTREMITIES:  No edema; No deformity   ASSESSMENT AND PLAN:    Chronic systolic dysfunction s/p Abbott CRT-D  euvolemic today Stable on an appropriate medical regimen Normal ICD function See Pace Art report No changes today  2.  Paroxysmal atrial fibrillation: Minimal episodes on device interrogation  3.  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  Coronary disease: Post CABG.  No current angina.  Plan per primary cardiology  Disposition:  Follow up with EP Team in 12 months   Signed, Dave Daniels Dave Norton, MD

## 2024-04-17 ENCOUNTER — Ambulatory Visit

## 2024-04-17 DIAGNOSIS — Z95810 Presence of automatic (implantable) cardiac defibrillator: Secondary | ICD-10-CM | POA: Diagnosis not present

## 2024-04-17 DIAGNOSIS — I5022 Chronic systolic (congestive) heart failure: Secondary | ICD-10-CM

## 2024-04-19 NOTE — Progress Notes (Signed)
 EPIC Encounter for ICM Monitoring  Patient Name: Dave Daniels is a 84 y.o. male Date: 04/19/2024 Primary Care Physican: Andrew Truman GRADE., MD Primary Cardiologist: Pietro Electrophysiologist: Inocencio Nephrologist: [facility] Bi-V Pacing: >99%    10/27/2023 Office Weight: 141 lbs 02/21/2024 Weight: 138-142 lbs   AT/AF Burden:0% (taking Eliquis )                                    Transmission results reviewed.      Diet:  No changes in diet.    Since 03/06/2024 ICM Remote Transmission: [place] thoracic impedance suggesting intermittent days with possible fluid accumulation.   Prescribed:  Torsemide  20 mg take 1 tablet(s) (20 mg total) by mouth twice a day.    Potassium 20 mEq take 2 tablets (40 mEq total) by mouth once a day      Labs: 10/25/2023 Creatinine 2.40, BUN 49, Potassium 4.2, Sodium 140, GFR 26 08/04/2023 Creatinine 2.32, BUN 46, Potassium 4.3, Sodium 142, GFR 27 04/26/2023 Creatinine 2.08, BUN 45, Potassium 4.3, Sodium 141, GFR 31 A complete set of results can be found in Results Review   Recommendations:  No changes.     Follow-up plan: ICM clinic phone appointment on 05/18/2024.   91 day device clinic remote transmission 05/08/2024.     EP/Cardiology Office Visits:  Recall 11/29/2023 with Dr Pietro.   Copy of ICM check sent to Dr. Inocencio.      Remote monitoring is medically necessary for Heart Failure Management.    Daily Thoracic Impedance ICM trend: 01/18/2024 through 04/17/2024.    12-14 Month Thoracic Impedance ICM trend:     Mitzie GORMAN Garner, RN 04/19/2024 12:41 PM

## 2024-04-20 ENCOUNTER — Telehealth: Payer: Self-pay | Admitting: Pharmacy Technician

## 2024-04-20 ENCOUNTER — Other Ambulatory Visit (HOSPITAL_COMMUNITY): Payer: Self-pay

## 2024-04-20 NOTE — Telephone Encounter (Signed)
" ° °  Patient called and said [employer] is charging 700. I ran test claim and it is that price on insurance but he has a orthoptist. I called [employer] and reminded them to run on ins and hw grant. Now free   "

## 2024-05-08 ENCOUNTER — Ambulatory Visit: Payer: Medicare HMO

## 2024-05-08 DIAGNOSIS — I5022 Chronic systolic (congestive) heart failure: Secondary | ICD-10-CM | POA: Diagnosis not present

## 2024-05-09 ENCOUNTER — Ambulatory Visit: Payer: Self-pay | Admitting: Cardiology

## 2024-05-09 LAB — CUP PACEART REMOTE DEVICE CHECK
Battery Remaining Longevity: 51 mo
Battery Remaining Percentage: 70 %
Battery Voltage: 2.96 V
Brady Statistic AP VP Percent: 82 %
Brady Statistic AP VS Percent: 1 %
Brady Statistic AS VP Percent: 17 %
Brady Statistic AS VS Percent: 1 %
Brady Statistic RA Percent Paced: 82 %
Date Time Interrogation Session: 20260126021024
HighPow Impedance: 57 Ohm
Implantable Lead Connection Status: 753985
Implantable Lead Connection Status: 753985
Implantable Lead Connection Status: 753985
Implantable Lead Implant Date: 20121219
Implantable Lead Implant Date: 20121219
Implantable Lead Implant Date: 20121219
Implantable Lead Location: 753858
Implantable Lead Location: 753859
Implantable Lead Location: 753860
Implantable Pulse Generator Implant Date: 20240126
Lead Channel Impedance Value: 310 Ohm
Lead Channel Impedance Value: 400 Ohm
Lead Channel Impedance Value: 650 Ohm
Lead Channel Pacing Threshold Amplitude: 0.75 V
Lead Channel Pacing Threshold Amplitude: 0.75 V
Lead Channel Pacing Threshold Amplitude: 2.25 V
Lead Channel Pacing Threshold Pulse Width: 0.5 ms
Lead Channel Pacing Threshold Pulse Width: 0.5 ms
Lead Channel Pacing Threshold Pulse Width: 0.6 ms
Lead Channel Sensing Intrinsic Amplitude: 11.8 mV
Lead Channel Sensing Intrinsic Amplitude: 3 mV
Lead Channel Setting Pacing Amplitude: 2 V
Lead Channel Setting Pacing Amplitude: 2.5 V
Lead Channel Setting Pacing Amplitude: 3.25 V
Lead Channel Setting Pacing Pulse Width: 0.5 ms
Lead Channel Setting Pacing Pulse Width: 0.6 ms
Lead Channel Setting Sensing Sensitivity: 0.5 mV
Pulse Gen Serial Number: 211014942

## 2024-05-11 NOTE — Progress Notes (Signed)
 31 day ICM Remote transmission canceled due to [facility] clinic is on hold until further notice.  91 day remote monitoring will continue per protocol.

## 2024-05-16 NOTE — Progress Notes (Signed)
 Remote ICD Transmission

## 2024-05-17 ENCOUNTER — Other Ambulatory Visit: Payer: Self-pay | Admitting: Cardiology

## 2024-08-07 ENCOUNTER — Ambulatory Visit

## 2024-11-06 ENCOUNTER — Ambulatory Visit

## 2025-02-05 ENCOUNTER — Ambulatory Visit

## 2025-05-07 ENCOUNTER — Ambulatory Visit

## 2025-08-06 ENCOUNTER — Ambulatory Visit
